# Patient Record
Sex: Male | Born: 1935 | Race: White | Hispanic: No | Marital: Married | Smoking: Former smoker
Health system: Southern US, Community
[De-identification: ages and names within clinical notes are randomized; demographics above are authoritative.]

## PROBLEM LIST (undated history)

## (undated) DIAGNOSIS — E785 Hyperlipidemia, unspecified: Secondary | ICD-10-CM

## (undated) DIAGNOSIS — I350 Nonrheumatic aortic (valve) stenosis: Secondary | ICD-10-CM

## (undated) DIAGNOSIS — Z952 Presence of prosthetic heart valve: Secondary | ICD-10-CM

## (undated) DIAGNOSIS — K429 Umbilical hernia without obstruction or gangrene: Secondary | ICD-10-CM

## (undated) DIAGNOSIS — Z951 Presence of aortocoronary bypass graft: Secondary | ICD-10-CM

## (undated) DIAGNOSIS — I2510 Atherosclerotic heart disease of native coronary artery without angina pectoris: Secondary | ICD-10-CM

## (undated) DIAGNOSIS — E119 Type 2 diabetes mellitus without complications: Secondary | ICD-10-CM

## (undated) DIAGNOSIS — I10 Essential (primary) hypertension: Secondary | ICD-10-CM

## (undated) DIAGNOSIS — I519 Heart disease, unspecified: Secondary | ICD-10-CM

## (undated) DIAGNOSIS — I719 Aortic aneurysm of unspecified site, without rupture: Secondary | ICD-10-CM

## (undated) DIAGNOSIS — Z8619 Personal history of other infectious and parasitic diseases: Secondary | ICD-10-CM

## (undated) HISTORY — DX: Umbilical hernia without obstruction or gangrene: K42.9

## (undated) HISTORY — DX: Personal history of other infectious and parasitic diseases: Z86.19

## (undated) HISTORY — DX: Hyperlipidemia, unspecified: E78.5

## (undated) HISTORY — DX: Aortic aneurysm of unspecified site, without rupture: I71.9

## (undated) HISTORY — PX: TONSILLECTOMY: SUR1361

## (undated) HISTORY — DX: Nonrheumatic aortic (valve) stenosis: I35.0

## (undated) HISTORY — PX: ABLATION: SHX5711

## (undated) HISTORY — DX: Heart disease, unspecified: I51.9

## (undated) HISTORY — DX: Atherosclerotic heart disease of native coronary artery without angina pectoris: I25.10

## (undated) HISTORY — DX: Essential (primary) hypertension: I10

## (undated) HISTORY — DX: Presence of aortocoronary bypass graft: Z95.1

## (undated) HISTORY — DX: Type 2 diabetes mellitus without complications: E11.9

---

## 1999-11-20 HISTORY — PX: CORONARY ARTERY BYPASS GRAFT: SHX141

## 2000-04-19 HISTORY — PX: EXPLORATION POST OPERATIVE OPEN HEART: SHX5061

## 2011-12-19 DIAGNOSIS — I10 Essential (primary) hypertension: Secondary | ICD-10-CM | POA: Diagnosis not present

## 2011-12-19 DIAGNOSIS — E785 Hyperlipidemia, unspecified: Secondary | ICD-10-CM | POA: Diagnosis not present

## 2011-12-19 DIAGNOSIS — I259 Chronic ischemic heart disease, unspecified: Secondary | ICD-10-CM | POA: Diagnosis not present

## 2011-12-19 DIAGNOSIS — Z0000 Encounter for general adult medical examination without abnormal findings: Secondary | ICD-10-CM | POA: Diagnosis not present

## 2011-12-19 DIAGNOSIS — E786 Lipoprotein deficiency: Secondary | ICD-10-CM | POA: Diagnosis not present

## 2011-12-24 DIAGNOSIS — I714 Abdominal aortic aneurysm, without rupture, unspecified: Secondary | ICD-10-CM | POA: Diagnosis not present

## 2012-01-03 DIAGNOSIS — I359 Nonrheumatic aortic valve disorder, unspecified: Secondary | ICD-10-CM | POA: Diagnosis not present

## 2012-01-03 DIAGNOSIS — Z951 Presence of aortocoronary bypass graft: Secondary | ICD-10-CM | POA: Diagnosis not present

## 2012-01-14 DIAGNOSIS — Z85828 Personal history of other malignant neoplasm of skin: Secondary | ICD-10-CM | POA: Diagnosis not present

## 2012-01-14 DIAGNOSIS — L821 Other seborrheic keratosis: Secondary | ICD-10-CM | POA: Diagnosis not present

## 2012-01-14 DIAGNOSIS — L578 Other skin changes due to chronic exposure to nonionizing radiation: Secondary | ICD-10-CM | POA: Diagnosis not present

## 2012-01-14 DIAGNOSIS — L570 Actinic keratosis: Secondary | ICD-10-CM | POA: Diagnosis not present

## 2012-04-08 DIAGNOSIS — Z0389 Encounter for observation for other suspected diseases and conditions ruled out: Secondary | ICD-10-CM | POA: Diagnosis not present

## 2012-04-08 DIAGNOSIS — E119 Type 2 diabetes mellitus without complications: Secondary | ICD-10-CM | POA: Diagnosis not present

## 2012-04-08 DIAGNOSIS — H47239 Glaucomatous optic atrophy, unspecified eye: Secondary | ICD-10-CM | POA: Diagnosis not present

## 2012-04-08 DIAGNOSIS — H2510 Age-related nuclear cataract, unspecified eye: Secondary | ICD-10-CM | POA: Diagnosis not present

## 2012-05-21 DIAGNOSIS — I10 Essential (primary) hypertension: Secondary | ICD-10-CM | POA: Diagnosis not present

## 2012-05-21 DIAGNOSIS — E785 Hyperlipidemia, unspecified: Secondary | ICD-10-CM | POA: Diagnosis not present

## 2012-05-21 DIAGNOSIS — I714 Abdominal aortic aneurysm, without rupture, unspecified: Secondary | ICD-10-CM | POA: Diagnosis not present

## 2012-05-21 DIAGNOSIS — I259 Chronic ischemic heart disease, unspecified: Secondary | ICD-10-CM | POA: Diagnosis not present

## 2012-06-17 DIAGNOSIS — Z951 Presence of aortocoronary bypass graft: Secondary | ICD-10-CM | POA: Diagnosis not present

## 2012-06-17 DIAGNOSIS — I359 Nonrheumatic aortic valve disorder, unspecified: Secondary | ICD-10-CM | POA: Diagnosis not present

## 2012-07-09 DIAGNOSIS — E782 Mixed hyperlipidemia: Secondary | ICD-10-CM | POA: Diagnosis not present

## 2012-07-09 DIAGNOSIS — Z951 Presence of aortocoronary bypass graft: Secondary | ICD-10-CM | POA: Diagnosis not present

## 2012-07-09 DIAGNOSIS — I10 Essential (primary) hypertension: Secondary | ICD-10-CM | POA: Diagnosis not present

## 2012-07-09 DIAGNOSIS — I359 Nonrheumatic aortic valve disorder, unspecified: Secondary | ICD-10-CM | POA: Diagnosis not present

## 2012-07-14 DIAGNOSIS — Z85828 Personal history of other malignant neoplasm of skin: Secondary | ICD-10-CM | POA: Diagnosis not present

## 2012-07-14 DIAGNOSIS — L821 Other seborrheic keratosis: Secondary | ICD-10-CM | POA: Diagnosis not present

## 2012-07-14 DIAGNOSIS — L570 Actinic keratosis: Secondary | ICD-10-CM | POA: Diagnosis not present

## 2012-07-14 DIAGNOSIS — L578 Other skin changes due to chronic exposure to nonionizing radiation: Secondary | ICD-10-CM | POA: Diagnosis not present

## 2012-08-22 DIAGNOSIS — Z23 Encounter for immunization: Secondary | ICD-10-CM | POA: Diagnosis not present

## 2012-12-15 DIAGNOSIS — E785 Hyperlipidemia, unspecified: Secondary | ICD-10-CM | POA: Diagnosis not present

## 2012-12-15 DIAGNOSIS — N529 Male erectile dysfunction, unspecified: Secondary | ICD-10-CM | POA: Diagnosis not present

## 2012-12-15 DIAGNOSIS — K4090 Unilateral inguinal hernia, without obstruction or gangrene, not specified as recurrent: Secondary | ICD-10-CM | POA: Diagnosis not present

## 2012-12-15 DIAGNOSIS — E786 Lipoprotein deficiency: Secondary | ICD-10-CM | POA: Diagnosis not present

## 2012-12-30 DIAGNOSIS — Z951 Presence of aortocoronary bypass graft: Secondary | ICD-10-CM | POA: Diagnosis not present

## 2012-12-30 DIAGNOSIS — E782 Mixed hyperlipidemia: Secondary | ICD-10-CM | POA: Diagnosis not present

## 2012-12-30 DIAGNOSIS — I10 Essential (primary) hypertension: Secondary | ICD-10-CM | POA: Diagnosis not present

## 2012-12-30 DIAGNOSIS — I359 Nonrheumatic aortic valve disorder, unspecified: Secondary | ICD-10-CM | POA: Diagnosis not present

## 2013-01-08 DIAGNOSIS — I471 Supraventricular tachycardia: Secondary | ICD-10-CM | POA: Diagnosis not present

## 2013-01-08 DIAGNOSIS — I061 Rheumatic aortic insufficiency: Secondary | ICD-10-CM | POA: Diagnosis not present

## 2013-01-08 DIAGNOSIS — Z951 Presence of aortocoronary bypass graft: Secondary | ICD-10-CM | POA: Diagnosis not present

## 2013-01-08 DIAGNOSIS — I060 Rheumatic aortic stenosis: Secondary | ICD-10-CM | POA: Diagnosis not present

## 2013-01-08 DIAGNOSIS — Z23 Encounter for immunization: Secondary | ICD-10-CM | POA: Diagnosis not present

## 2013-04-10 DIAGNOSIS — I10 Essential (primary) hypertension: Secondary | ICD-10-CM | POA: Diagnosis not present

## 2013-04-10 DIAGNOSIS — H47239 Glaucomatous optic atrophy, unspecified eye: Secondary | ICD-10-CM | POA: Diagnosis not present

## 2013-04-10 DIAGNOSIS — I519 Heart disease, unspecified: Secondary | ICD-10-CM | POA: Diagnosis not present

## 2013-04-10 DIAGNOSIS — E119 Type 2 diabetes mellitus without complications: Secondary | ICD-10-CM | POA: Diagnosis not present

## 2013-04-20 DIAGNOSIS — Z85828 Personal history of other malignant neoplasm of skin: Secondary | ICD-10-CM | POA: Diagnosis not present

## 2013-04-20 DIAGNOSIS — L821 Other seborrheic keratosis: Secondary | ICD-10-CM | POA: Diagnosis not present

## 2013-04-20 DIAGNOSIS — L570 Actinic keratosis: Secondary | ICD-10-CM | POA: Diagnosis not present

## 2013-04-20 DIAGNOSIS — L578 Other skin changes due to chronic exposure to nonionizing radiation: Secondary | ICD-10-CM | POA: Diagnosis not present

## 2013-05-14 DIAGNOSIS — E786 Lipoprotein deficiency: Secondary | ICD-10-CM | POA: Diagnosis not present

## 2013-05-14 DIAGNOSIS — E785 Hyperlipidemia, unspecified: Secondary | ICD-10-CM | POA: Diagnosis not present

## 2013-05-14 DIAGNOSIS — I2510 Atherosclerotic heart disease of native coronary artery without angina pectoris: Secondary | ICD-10-CM | POA: Diagnosis not present

## 2013-05-14 DIAGNOSIS — Z0000 Encounter for general adult medical examination without abnormal findings: Secondary | ICD-10-CM | POA: Diagnosis not present

## 2013-05-14 DIAGNOSIS — I10 Essential (primary) hypertension: Secondary | ICD-10-CM | POA: Diagnosis not present

## 2013-06-23 DIAGNOSIS — Z951 Presence of aortocoronary bypass graft: Secondary | ICD-10-CM | POA: Diagnosis not present

## 2013-06-23 DIAGNOSIS — I060 Rheumatic aortic stenosis: Secondary | ICD-10-CM | POA: Diagnosis not present

## 2013-06-23 DIAGNOSIS — I061 Rheumatic aortic insufficiency: Secondary | ICD-10-CM | POA: Diagnosis not present

## 2013-06-23 DIAGNOSIS — I471 Supraventricular tachycardia: Secondary | ICD-10-CM | POA: Diagnosis not present

## 2013-07-08 DIAGNOSIS — Z951 Presence of aortocoronary bypass graft: Secondary | ICD-10-CM | POA: Diagnosis not present

## 2013-07-08 DIAGNOSIS — Z23 Encounter for immunization: Secondary | ICD-10-CM | POA: Diagnosis not present

## 2013-07-08 DIAGNOSIS — I061 Rheumatic aortic insufficiency: Secondary | ICD-10-CM | POA: Diagnosis not present

## 2013-07-08 DIAGNOSIS — I060 Rheumatic aortic stenosis: Secondary | ICD-10-CM | POA: Diagnosis not present

## 2013-07-08 DIAGNOSIS — E119 Type 2 diabetes mellitus without complications: Secondary | ICD-10-CM | POA: Diagnosis not present

## 2013-07-08 DIAGNOSIS — I10 Essential (primary) hypertension: Secondary | ICD-10-CM | POA: Diagnosis not present

## 2013-07-08 DIAGNOSIS — I471 Supraventricular tachycardia, unspecified: Secondary | ICD-10-CM | POA: Diagnosis not present

## 2013-07-29 DIAGNOSIS — Z23 Encounter for immunization: Secondary | ICD-10-CM | POA: Diagnosis not present

## 2013-11-16 DIAGNOSIS — E785 Hyperlipidemia, unspecified: Secondary | ICD-10-CM | POA: Diagnosis not present

## 2013-11-16 DIAGNOSIS — I10 Essential (primary) hypertension: Secondary | ICD-10-CM | POA: Diagnosis not present

## 2013-11-16 DIAGNOSIS — I2510 Atherosclerotic heart disease of native coronary artery without angina pectoris: Secondary | ICD-10-CM | POA: Diagnosis not present

## 2013-11-16 DIAGNOSIS — E119 Type 2 diabetes mellitus without complications: Secondary | ICD-10-CM | POA: Diagnosis not present

## 2013-11-19 HISTORY — PX: UMBILICAL HERNIA REPAIR: SHX196

## 2013-12-28 DIAGNOSIS — L719 Rosacea, unspecified: Secondary | ICD-10-CM | POA: Diagnosis not present

## 2013-12-28 DIAGNOSIS — Z85828 Personal history of other malignant neoplasm of skin: Secondary | ICD-10-CM | POA: Diagnosis not present

## 2013-12-28 DIAGNOSIS — L578 Other skin changes due to chronic exposure to nonionizing radiation: Secondary | ICD-10-CM | POA: Diagnosis not present

## 2013-12-28 DIAGNOSIS — L570 Actinic keratosis: Secondary | ICD-10-CM | POA: Diagnosis not present

## 2014-01-11 DIAGNOSIS — K4090 Unilateral inguinal hernia, without obstruction or gangrene, not specified as recurrent: Secondary | ICD-10-CM | POA: Diagnosis not present

## 2014-01-13 DIAGNOSIS — Z79899 Other long term (current) drug therapy: Secondary | ICD-10-CM | POA: Diagnosis not present

## 2014-01-13 DIAGNOSIS — K4090 Unilateral inguinal hernia, without obstruction or gangrene, not specified as recurrent: Secondary | ICD-10-CM | POA: Diagnosis not present

## 2014-01-13 DIAGNOSIS — Z01818 Encounter for other preprocedural examination: Secondary | ICD-10-CM | POA: Diagnosis not present

## 2014-01-13 DIAGNOSIS — Z87891 Personal history of nicotine dependence: Secondary | ICD-10-CM | POA: Diagnosis not present

## 2014-01-13 DIAGNOSIS — I10 Essential (primary) hypertension: Secondary | ICD-10-CM | POA: Diagnosis not present

## 2014-01-13 DIAGNOSIS — Z7982 Long term (current) use of aspirin: Secondary | ICD-10-CM | POA: Diagnosis not present

## 2014-01-13 DIAGNOSIS — I2510 Atherosclerotic heart disease of native coronary artery without angina pectoris: Secondary | ICD-10-CM | POA: Diagnosis not present

## 2014-01-13 DIAGNOSIS — I498 Other specified cardiac arrhythmias: Secondary | ICD-10-CM | POA: Diagnosis not present

## 2014-01-13 DIAGNOSIS — E119 Type 2 diabetes mellitus without complications: Secondary | ICD-10-CM | POA: Diagnosis not present

## 2014-01-19 DIAGNOSIS — K4090 Unilateral inguinal hernia, without obstruction or gangrene, not specified as recurrent: Secondary | ICD-10-CM | POA: Diagnosis not present

## 2014-01-19 DIAGNOSIS — Z79899 Other long term (current) drug therapy: Secondary | ICD-10-CM | POA: Diagnosis not present

## 2014-01-19 DIAGNOSIS — Z87891 Personal history of nicotine dependence: Secondary | ICD-10-CM | POA: Diagnosis not present

## 2014-01-19 DIAGNOSIS — E119 Type 2 diabetes mellitus without complications: Secondary | ICD-10-CM | POA: Diagnosis not present

## 2014-01-19 DIAGNOSIS — Z7982 Long term (current) use of aspirin: Secondary | ICD-10-CM | POA: Diagnosis not present

## 2014-01-19 DIAGNOSIS — I2510 Atherosclerotic heart disease of native coronary artery without angina pectoris: Secondary | ICD-10-CM | POA: Diagnosis not present

## 2014-01-19 DIAGNOSIS — I10 Essential (primary) hypertension: Secondary | ICD-10-CM | POA: Diagnosis not present

## 2014-01-26 DIAGNOSIS — I060 Rheumatic aortic stenosis: Secondary | ICD-10-CM | POA: Diagnosis not present

## 2014-01-26 DIAGNOSIS — I10 Essential (primary) hypertension: Secondary | ICD-10-CM | POA: Diagnosis not present

## 2014-01-26 DIAGNOSIS — Z951 Presence of aortocoronary bypass graft: Secondary | ICD-10-CM | POA: Diagnosis not present

## 2014-04-20 DIAGNOSIS — H2589 Other age-related cataract: Secondary | ICD-10-CM | POA: Diagnosis not present

## 2014-04-20 DIAGNOSIS — E119 Type 2 diabetes mellitus without complications: Secondary | ICD-10-CM | POA: Diagnosis not present

## 2014-07-08 ENCOUNTER — Ambulatory Visit (INDEPENDENT_AMBULATORY_CARE_PROVIDER_SITE_OTHER): Payer: Medicare Other | Admitting: Physician Assistant

## 2014-07-08 ENCOUNTER — Encounter: Payer: Self-pay | Admitting: Physician Assistant

## 2014-07-08 ENCOUNTER — Telehealth: Payer: Self-pay | Admitting: Physician Assistant

## 2014-07-08 VITALS — BP 130/76 | HR 55 | Temp 97.8°F | Resp 16 | Ht 68.0 in | Wt 174.2 lb

## 2014-07-08 DIAGNOSIS — I10 Essential (primary) hypertension: Secondary | ICD-10-CM

## 2014-07-08 DIAGNOSIS — Z1211 Encounter for screening for malignant neoplasm of colon: Secondary | ICD-10-CM

## 2014-07-08 DIAGNOSIS — E119 Type 2 diabetes mellitus without complications: Secondary | ICD-10-CM | POA: Diagnosis not present

## 2014-07-08 DIAGNOSIS — I2510 Atherosclerotic heart disease of native coronary artery without angina pectoris: Secondary | ICD-10-CM

## 2014-07-08 DIAGNOSIS — E785 Hyperlipidemia, unspecified: Secondary | ICD-10-CM | POA: Diagnosis not present

## 2014-07-08 DIAGNOSIS — E1169 Type 2 diabetes mellitus with other specified complication: Secondary | ICD-10-CM | POA: Insufficient documentation

## 2014-07-08 LAB — BASIC METABOLIC PANEL WITH GFR
BUN: 19 mg/dL (ref 6–23)
CHLORIDE: 100 meq/L (ref 96–112)
CO2: 27 meq/L (ref 19–32)
CREATININE: 1.02 mg/dL (ref 0.50–1.35)
Calcium: 9.5 mg/dL (ref 8.4–10.5)
GFR, Est African American: 80 mL/min
GFR, Est Non African American: 69 mL/min
Glucose, Bld: 84 mg/dL (ref 70–99)
Potassium: 4.3 mEq/L (ref 3.5–5.3)
SODIUM: 135 meq/L (ref 135–145)

## 2014-07-08 LAB — HEMOGLOBIN A1C
HEMOGLOBIN A1C: 7.7 % — AB (ref <5.7)
MEAN PLASMA GLUCOSE: 174 mg/dL — AB (ref <117)

## 2014-07-08 LAB — LIPID PANEL
Cholesterol: 121 mg/dL (ref 0–200)
HDL: 37 mg/dL — AB (ref >39)
LDL CALC: 69 mg/dL (ref 0–99)
Total CHOL/HDL Ratio: 3.3 Ratio
Triglycerides: 76 mg/dL (ref <150)
VLDL: 15 mg/dL (ref 0–40)

## 2014-07-08 NOTE — Assessment & Plan Note (Signed)
BP normotensive in clinic today.  Will check BMP.  Continue current regimen.

## 2014-07-08 NOTE — Progress Notes (Signed)
Pre visit review using our clinic review tool, if applicable. No additional management support is needed unless otherwise documented below in the visit note/SLS  

## 2014-07-08 NOTE — Assessment & Plan Note (Addendum)
Will recheck BMP and A1C.  Patient is on ARB therapy.  Endorses fasting glucose levels are at goal.  Discussed nutrition and foot care at today's visit.  Referral placed to Ophthalmology for diabetic eye examination.

## 2014-07-08 NOTE — Patient Instructions (Signed)
Please continue medications as directed.  You will be contacted by Dr. Rachael Fee office for an appointment and from a GI Doctor's office for a colonoscopy. Please stop by the lab for blood work before leaving today. I will cal you with your results.  Return in November for your Annual Physical.

## 2014-07-08 NOTE — Assessment & Plan Note (Signed)
Will obtain fasting lipid panel.  Will alsop check liver function. Continue Statin and ASA.

## 2014-07-08 NOTE — Progress Notes (Addendum)
Patient presents to clinic today to establish care.  Acute Concerns: No acute concerns at today's visit.  Chronic Issues: Patient with PMH significant for Hypertension, Hyperlipidemia, CAD and Type II DM.  In terms of hypertension, patient endorses is well controlled with amlodipine, spironolactone and losartan.  Denies ankle swelling.  Denies chest pain, palpitations, headache, vision changes, lightheadedness or dizziness. Takes a 325 mg aspirin daily. Is taking Metfromin 500 mg twice daily and Glyburide 2.5 mg twice daily for glucose management.  Is checking fasting blood glucose daily.  Ranges from 80-110.  Last A1C 4 months ago at 7.4.  Denies neuropathy, of history of retinopathy or nephropathy.   Health Maintenance: Dental -- up-to-date Vision -- Needs referral to Ophthalmology. Will refer to [facility]. Immunizations -- Unsure of dates for Immunizations. Colonoscopy -- Last in 2012; needed repeat in 2015.  Will refer to GI for colonoscopy.  Past Medical History  Diagnosis Date  . Hyperlipidemia   . Hypertension   . Diabetes mellitus type II, controlled   . Umbilical hernia   . Heart disease     Ablation  . History of chicken pox     Past Surgical History  Procedure Laterality Date  . Umbilical hernia repair  2015  . Exploration post operative open heart  2001, June  . Cardiac electrophysiology mapping and ablation  2001    No current outpatient prescriptions on file prior to visit.   No current facility-administered medications on file prior to visit.    No Known Allergies  Family History  Problem Relation Age of Onset  . Diabetes Mother 64    Deceased  . Heart disease Father 22    Deceased  . Heart disease Maternal Uncle   . Diabetes Maternal Uncle   . Heart disease Brother   . Diabetes Brother   . Diabetes Sister     #1  . Heart disease Sister     #2  . Hyperlipidemia Son     x2    History   Social History  . Marital Status: Married     Spouse Name: N/A    Number of Children: N/A  . Years of Education: N/A   Occupational History  . Not on file.   Social History Main Topics  . Smoking status: Never Smoker   . Smokeless tobacco: Never Used  . Alcohol Use: No  . Drug Use: No  . Sexual Activity: Not on file   Other Topics Concern  . Not on file   Social History Narrative  . No narrative on file   Review of Systems  Constitutional: Negative for fever and weight loss.  HENT: Negative for ear discharge, ear pain, hearing loss and tinnitus.   Eyes: Negative for blurred vision, double vision, photophobia, pain and discharge.  Respiratory: Negative for shortness of breath.   Cardiovascular: Negative for chest pain and palpitations.  Gastrointestinal: Negative for heartburn, nausea, vomiting, abdominal pain, diarrhea, constipation, blood in stool and melena.  Genitourinary: Negative for dysuria, urgency, frequency, hematuria and flank pain.  Neurological: Negative for dizziness, loss of consciousness and headaches.  Endo/Heme/Allergies: Negative for environmental allergies.  Psychiatric/Behavioral: Negative for depression. The patient is not nervous/anxious and does not have insomnia.    BP 130/76  Pulse 55  Temp(Src) 97.8 F (36.6 C) (Oral)  Resp 16  Ht 5\' 8"  (1.727 m)  Wt 174 lb 4 oz (79.039 kg)  BMI 26.50 kg/m2  SpO2 97%  Physical Exam  Vitals reviewed. Constitutional:  He is oriented to person, place, and time and well-developed, well-nourished, and in no distress.  HENT:  Head: Normocephalic and atraumatic.  Right Ear: External ear normal.  Left Ear: External ear normal.  Nose: Nose normal.  Mouth/Throat: Oropharynx is clear and moist. No oropharyngeal exudate.  TM within normal limits bilaterally.  Eyes: Conjunctivae are normal.  Neck: Neck supple. No thyromegaly present.  Cardiovascular: Normal rate, regular rhythm, normal heart sounds and intact distal pulses.   No murmur heard. Pulmonary/Chest:  Effort normal and breath sounds normal. No respiratory distress. He has no wheezes. He has no rales. He exhibits no tenderness.  Lymphadenopathy:    He has no cervical adenopathy.  Neurological: He is alert and oriented to person, place, and time. No cranial nerve deficit. Gait normal.  Skin: Skin is warm and dry. No rash noted.  Psychiatric: Affect normal.    Assessment/Plan: Essential hypertension, benign BP normotensive in clinic today.  Will check BMP.  Continue current regimen.   Coronary artery disease Continue statin, antihypertensives and ASA.   Will obtain EKG and Medicare Wellness visit.  Will obtain records from previous PCP.  Type II diabetes mellitus, well controlled Will recheck BMP and A1C.  Patient is on ARB therapy.  Endorses fasting glucose levels are at goal.  Discussed nutrition and foot care at today's visit.  Referral placed to Ophthalmology for diabetic eye examination.  Hyperlipidemia LDL goal <100 Will obtain fasting lipid panel.  Will alsop check liver function. Continue Statin and ASA.   Colon cancer screening Will refer to GI per patient request but giving good history of screenings GI may not wish to continue screening.

## 2014-07-08 NOTE — Assessment & Plan Note (Signed)
Continue statin, antihypertensives and ASA.   Will obtain EKG and Medicare Wellness visit.  Will obtain records from previous PCP.

## 2014-07-08 NOTE — Telephone Encounter (Signed)
Relevant patient education assigned to patient using Emmi. ° °

## 2014-07-08 NOTE — Assessment & Plan Note (Signed)
Will refer to GI per patient request but giving good history of screenings GI may not wish to continue screening.

## 2014-07-09 ENCOUNTER — Telehealth: Payer: Self-pay | Admitting: Physician Assistant

## 2014-07-11 MED ORDER — METFORMIN HCL 500 MG PO TABS: ORAL_TABLET | ORAL | Status: DC

## 2014-07-11 NOTE — Telephone Encounter (Signed)
A1C still above goal at 7.7.  Want to increase his Metformin to 1000 mg (2 tablets) each morning and 500 mg (1 tablet) each evening.  Continue monitoring blood glucose each morning. Limit carb intake.  Eat more frequently (3-4 hours).  Make sure to eat more before significant physical activity to prevent glucose from dropping too low.  Follow-up in 3 months for medication management and repeat A1C.

## 2014-07-12 ENCOUNTER — Telehealth: Payer: Self-pay

## 2014-07-12 NOTE — Telephone Encounter (Signed)
Pt stated that "he needed to speak with Peter Scott." He didn't state the reason. LDM

## 2014-07-12 NOTE — Telephone Encounter (Signed)
De Burrs, CMA at 07/12/2014 11:58 AM     Status: Signed        Pt stated that "he needed to speak with Ivin Booty." He didn't state the reason. LDM   Spoke with patient, he is waiting to have [place] records transferred from [place] to [facility], to be able to get set-up in the system here. He is going to check price at [facility] for Metformin Rx sent by provider and call back tomorrow and let us know if he will p/u there and/or request [facility]

## 2014-07-12 NOTE — Telephone Encounter (Signed)
NOTE ALREADY OPENED RE: RESULTS AND F/U INSTRUCTIONS/sls

## 2014-07-12 NOTE — Telephone Encounter (Signed)
LMOM with contact name and number for return call RE: results and further provider instructions/SLS  

## 2014-07-14 ENCOUNTER — Telehealth: Payer: Self-pay | Admitting: *Deleted

## 2014-07-14 MED ORDER — AMLODIPINE BESYLATE 10 MG PO TABS: 5.0000 mg | ORAL_TABLET | Freq: Every day | ORAL | Status: DC

## 2014-07-14 MED ORDER — FA-PYRIDOXINE-CYANOCOBALAMIN 2.5-25-2 MG PO TABS: 1.0000 | ORAL_TABLET | Freq: Every day | ORAL | Status: DC

## 2014-07-14 MED ORDER — SPIRONOLACTONE 25 MG PO TABS: 12.5000 mg | ORAL_TABLET | Freq: Every day | ORAL | Status: DC

## 2014-07-14 MED ORDER — METFORMIN HCL 500 MG PO TABS: ORAL_TABLET | ORAL | Status: DC

## 2014-07-14 MED ORDER — LOSARTAN POTASSIUM 100 MG PO TABS: 50.0000 mg | ORAL_TABLET | Freq: Two times a day (BID) | ORAL | Status: DC

## 2014-07-14 MED ORDER — GLUCOSE BLOOD VI STRP: ORAL_STRIP | Status: DC

## 2014-07-14 MED ORDER — GLYBURIDE 5 MG PO TABS: 2.5000 mg | ORAL_TABLET | Freq: Two times a day (BID) | ORAL | Status: DC

## 2014-07-14 MED ORDER — ATORVASTATIN CALCIUM 80 MG PO TABS: 40.0000 mg | ORAL_TABLET | Freq: Every day | ORAL | Status: DC

## 2014-07-14 NOTE — Telephone Encounter (Signed)
Patient came into office requesting to have printed Rx[s] to take to [place] [90-day]; scripts printed and signed by Debbrah Alar, FNP in PCP absence from office, given to patient/SLS

## 2014-07-14 NOTE — Telephone Encounter (Signed)
Patient came into office today and stated that he had not [facility] as of yet, but that he would need to go to the [place] soon and requested to have Scripts printed [90-day]; Rx[s] printed and signed by Debbrah Alar, FNP [in PCP abscence from office] and then given to patient/SLS

## 2014-07-23 ENCOUNTER — Encounter: Payer: Self-pay | Admitting: Physician Assistant

## 2014-07-23 DIAGNOSIS — C4491 Basal cell carcinoma of skin, unspecified: Secondary | ICD-10-CM | POA: Insufficient documentation

## 2014-07-23 DIAGNOSIS — C44621 Squamous cell carcinoma of skin of unspecified upper limb, including shoulder: Secondary | ICD-10-CM | POA: Insufficient documentation

## 2014-07-23 DIAGNOSIS — Z9884 Bariatric surgery status: Secondary | ICD-10-CM | POA: Insufficient documentation

## 2014-07-23 DIAGNOSIS — I714 Abdominal aortic aneurysm, without rupture, unspecified: Secondary | ICD-10-CM | POA: Insufficient documentation

## 2014-08-16 ENCOUNTER — Encounter: Payer: Self-pay | Admitting: Physician Assistant

## 2014-08-16 ENCOUNTER — Telehealth: Payer: Self-pay | Admitting: Physician Assistant

## 2014-08-16 DIAGNOSIS — L989 Disorder of the skin and subcutaneous tissue, unspecified: Secondary | ICD-10-CM

## 2014-08-16 DIAGNOSIS — I340 Nonrheumatic mitral (valve) insufficiency: Secondary | ICD-10-CM

## 2014-08-16 NOTE — Telephone Encounter (Signed)
Spoke with patient's wife concerning Dermatology and Cardiology referrals.  Referrals placed.  Patient with hx of valvular regurgitation, asymptomatic at present.  Well overdue for repeat echocardiogram.  Order placed.

## 2014-08-18 ENCOUNTER — Other Ambulatory Visit (HOSPITAL_COMMUNITY): Payer: Medicare Other

## 2014-08-19 ENCOUNTER — Encounter (HOSPITAL_COMMUNITY): Payer: Self-pay | Admitting: Physician Assistant

## 2014-08-25 DIAGNOSIS — L570 Actinic keratosis: Secondary | ICD-10-CM | POA: Diagnosis not present

## 2014-08-25 DIAGNOSIS — Z85828 Personal history of other malignant neoplasm of skin: Secondary | ICD-10-CM | POA: Diagnosis not present

## 2014-08-25 DIAGNOSIS — Z08 Encounter for follow-up examination after completed treatment for malignant neoplasm: Secondary | ICD-10-CM | POA: Diagnosis not present

## 2014-08-25 DIAGNOSIS — D485 Neoplasm of uncertain behavior of skin: Secondary | ICD-10-CM | POA: Diagnosis not present

## 2014-08-26 ENCOUNTER — Ambulatory Visit (HOSPITAL_COMMUNITY): Payer: Medicare Other | Attending: Cardiology | Admitting: Radiology

## 2014-08-26 DIAGNOSIS — I340 Nonrheumatic mitral (valve) insufficiency: Secondary | ICD-10-CM | POA: Diagnosis not present

## 2014-08-26 DIAGNOSIS — I350 Nonrheumatic aortic (valve) stenosis: Secondary | ICD-10-CM | POA: Diagnosis not present

## 2014-08-26 DIAGNOSIS — I2510 Atherosclerotic heart disease of native coronary artery without angina pectoris: Secondary | ICD-10-CM | POA: Insufficient documentation

## 2014-08-26 NOTE — Progress Notes (Signed)
Echocardiogram performed.  

## 2014-09-01 ENCOUNTER — Ambulatory Visit (INDEPENDENT_AMBULATORY_CARE_PROVIDER_SITE_OTHER): Payer: Medicare Other

## 2014-09-01 DIAGNOSIS — Z23 Encounter for immunization: Secondary | ICD-10-CM

## 2014-09-23 DIAGNOSIS — D0439 Carcinoma in situ of skin of other parts of face: Secondary | ICD-10-CM | POA: Diagnosis not present

## 2014-09-23 DIAGNOSIS — L570 Actinic keratosis: Secondary | ICD-10-CM | POA: Diagnosis not present

## 2014-09-29 ENCOUNTER — Encounter: Payer: Self-pay | Admitting: Cardiology

## 2014-09-29 ENCOUNTER — Ambulatory Visit (INDEPENDENT_AMBULATORY_CARE_PROVIDER_SITE_OTHER): Payer: Medicare Other | Admitting: Cardiology

## 2014-09-29 ENCOUNTER — Encounter: Payer: Self-pay | Admitting: *Deleted

## 2014-09-29 VITALS — BP 140/60 | HR 62 | Ht 69.0 in | Wt 172.4 lb

## 2014-09-29 DIAGNOSIS — I2510 Atherosclerotic heart disease of native coronary artery without angina pectoris: Secondary | ICD-10-CM

## 2014-09-29 DIAGNOSIS — R0989 Other specified symptoms and signs involving the circulatory and respiratory systems: Secondary | ICD-10-CM

## 2014-09-29 DIAGNOSIS — I714 Abdominal aortic aneurysm, without rupture, unspecified: Secondary | ICD-10-CM

## 2014-09-29 DIAGNOSIS — I350 Nonrheumatic aortic (valve) stenosis: Secondary | ICD-10-CM | POA: Insufficient documentation

## 2014-09-29 NOTE — Progress Notes (Signed)
HPI: 80 year old male for evaluation of CAD and mitral regurgitation. Patient is status post coronary artery bypass graft in [place] in 2001. He also had ablation of what sounds to be SVT at that time. Previous records have been reviewed. Nuclear study in 2006 showed an ejection fraction of 70% and normal perfusion. Abdominal ultrasound in [place] in 2013 showed abdominal aortic aneurysm of 2.5 cm. Echocardiogram in October 2015 showed normal LV function. There was moderate aortic stenosis with a mean gradient of 23 mmHg. Mild to moderate aortic insufficiency. There was moderate left atrial enlargement and mild mitral regurgitation. Mild tricuspid regurgitation with mildly elevated pulmonary pressures. Patient denies dyspnea, chest pain, palpitations, syncope or bleeding.No edema.  Current Outpatient Prescriptions  Medication Sig Dispense Refill  . amLODipine (NORVASC) 10 MG tablet Take 0.5 tablets (5 mg total) by mouth daily. 45 tablet 1  . aspirin 325 MG tablet Take 325 mg by mouth every morning.    Marland Kitchen atorvastatin (LIPITOR) 80 MG tablet Take 0.5 tablets (40 mg total) by mouth at bedtime. 45 tablet 1  . folic acid-pyridoxine-cyancobalamin (FOLTX) 2.5-25-2 MG TABS Take 1 tablet by mouth daily. 90 each 1  . glucose blood test strip Bayer Contour. Use as instructed to test blood glucose once daily Dx: 250.00 200 each 1  . glyBURIDE (DIABETA) 5 MG tablet Take 0.5 tablets (2.5 mg total) by mouth 2 (two) times daily with a meal. 90 tablet 1  . losartan (COZAAR) 100 MG tablet Take 0.5 tablets (50 mg total) by mouth 2 (two) times daily. 90 tablet 1  . metFORMIN (GLUCOPHAGE) 500 MG tablet Take 2 tablets (1000 mg) by mouth each morning.  Take 1 tablet (500mg ) by mouth each evening. 270 tablet 1  . spironolactone (ALDACTONE) 25 MG tablet Take 0.5 tablets (12.5 mg total) by mouth daily. 45 tablet 1   No current facility-administered medications for this visit.    No Known Allergies   Past Medical  History  Diagnosis Date  . Hyperlipidemia   . Hypertension   . Diabetes mellitus type II, controlled   . Umbilical hernia   . Heart disease     Ablation  . History of chicken pox   . CAD (coronary artery disease)     Past Surgical History  Procedure Laterality Date  . Umbilical hernia repair  2015  . Exploration post operative open heart  2001, June  . Coronary artery bypass graft  2001  . Ablation      Rhythm problem; rhythm unknown; [place]  . Tonsillectomy      History   Social History  . Marital Status: Married    Spouse Name: N/A    Number of Children: 2  . Years of Education: N/A   Occupational History  . Not on file.   Social History Main Topics  . Smoking status: Former Research scientist (life sciences)  . Smokeless tobacco: Never Used  . Alcohol Use: No  . Drug Use: No  . Sexual Activity: Not on file   Other Topics Concern  . Not on file   Social History Narrative    Family History  Problem Relation Age of Onset  . Diabetes Mother 83    Deceased  . Heart disease Father 69    Deceased  . Heart disease Maternal Uncle   . Diabetes Maternal Uncle   . Heart disease Brother   . Diabetes Brother   . Diabetes Sister     #1  . Heart disease Sister     #  2  . Hyperlipidemia Son     x2    ROS: no fevers or chills, productive cough, hemoptysis, dysphasia, odynophagia, melena, hematochezia, dysuria, hematuria, rash, seizure activity, orthopnea, PND, pedal edema, claudication. Remaining systems are negative.  Physical Exam:   Blood pressure 140/60, pulse 62, height 5\' 9"  (1.753 m), weight 172 lb 6.4 oz (78.2 kg).  General:  Well developed/well nourished in NAD Skin warm/dry Patient not depressed No peripheral clubbing Back-normal HEENT-normal/normal eyelids Neck supple/normal carotid upstroke bilaterally; Bilateral bruits; no JVD; no thyromegaly chest - CTA/ normal expansion CV - RRR/normal S1 and S2; no rubs or gallops;  PMI nondisplaced, 2/6 systolic murmur left  sternal border. No diastolic murmur. Abdomen -NT/ND, no HSM, no mass, + bowel sounds, no bruit 2+ femoral pulses, no bruits Ext-no edema, chords, 2+ DP Neuro-grossly nonfocal  ECG Sinus rhythm at a rate of 62. Cannot rule out prior inferior infarct. Nonspecific T-wave changes. Left ventricular hypertrophy

## 2014-09-29 NOTE — Patient Instructions (Addendum)
Your physician wants you to follow-up in: [place] will receive a reminder letter in the mail two months in advance. If you don't receive a letter, please call our office to schedule the follow-up appointment.   Your physician has requested that you have en exercise stress myoview. For further information please visit [email or web address]. Please follow instruction sheet, as given.   Your physician has requested that you have a carotid duplex. This test is an ultrasound of the carotid arteries in your neck. It looks at blood flow through these arteries that supply the brain with blood. Allow one hour for this exam. There are no restrictions or special instructions.   Your physician has requested that you have an abdominal aorta duplex. During this test, an ultrasound is used to evaluate the aorta. Allow 30 minutes for this exam. Do not eat after midnight the day before and avoid carbonated beverages   STOP FOLIC ACID

## 2014-09-29 NOTE — Assessment & Plan Note (Signed)
Continue statin. 

## 2014-09-29 NOTE — Assessment & Plan Note (Signed)
Continue aspirin and statin. Outside records have been reviewed. Last nuclear study 2006. Plan repeat study for risk stratification.

## 2014-09-29 NOTE — Assessment & Plan Note (Signed)
Blood pressure controlled. Continue present medications. 

## 2014-09-29 NOTE — Assessment & Plan Note (Signed)
Plan repeat abdominal ultrasound.

## 2014-09-29 NOTE — Assessment & Plan Note (Signed)
Patient has moderate aortic stenosis and mild to moderate aortic insufficiency on echocardiogram. I explained that he may require aortic valve replacement in the future. I also discussed symptoms that he should be concerned about if they occur including chest pain, dyspnea and syncope. Will repeat echocardiogram in August 2016.

## 2014-09-29 NOTE — Assessment & Plan Note (Signed)
Schedule carotid Dopplers. 

## 2014-10-01 ENCOUNTER — Ambulatory Visit (HOSPITAL_BASED_OUTPATIENT_CLINIC_OR_DEPARTMENT_OTHER)
Admission: RE | Admit: 2014-10-01 | Discharge: 2014-10-01 | Disposition: A | Discharge status: Home / Self Care | Payer: Medicare Other | Source: Ambulatory Visit | Attending: Cardiology | Admitting: Cardiology

## 2014-10-01 DIAGNOSIS — E119 Type 2 diabetes mellitus without complications: Secondary | ICD-10-CM | POA: Insufficient documentation

## 2014-10-01 DIAGNOSIS — I2510 Atherosclerotic heart disease of native coronary artery without angina pectoris: Secondary | ICD-10-CM | POA: Diagnosis not present

## 2014-10-01 DIAGNOSIS — I714 Abdominal aortic aneurysm, without rupture, unspecified: Secondary | ICD-10-CM

## 2014-10-01 DIAGNOSIS — Z951 Presence of aortocoronary bypass graft: Secondary | ICD-10-CM | POA: Insufficient documentation

## 2014-10-01 DIAGNOSIS — I10 Essential (primary) hypertension: Secondary | ICD-10-CM | POA: Insufficient documentation

## 2014-10-01 DIAGNOSIS — R0989 Other specified symptoms and signs involving the circulatory and respiratory systems: Secondary | ICD-10-CM

## 2014-10-01 DIAGNOSIS — I6523 Occlusion and stenosis of bilateral carotid arteries: Secondary | ICD-10-CM | POA: Diagnosis not present

## 2014-10-04 ENCOUNTER — Ambulatory Visit (INDEPENDENT_AMBULATORY_CARE_PROVIDER_SITE_OTHER): Payer: Medicare Other | Admitting: Physician Assistant

## 2014-10-04 ENCOUNTER — Encounter: Payer: Self-pay | Admitting: Physician Assistant

## 2014-10-04 VITALS — BP 132/54 | HR 51 | Temp 98.2°F | Resp 16 | Ht 68.0 in | Wt 170.0 lb

## 2014-10-04 DIAGNOSIS — Z0000 Encounter for general adult medical examination without abnormal findings: Secondary | ICD-10-CM | POA: Diagnosis not present

## 2014-10-04 DIAGNOSIS — I714 Abdominal aortic aneurysm, without rupture, unspecified: Secondary | ICD-10-CM

## 2014-10-04 DIAGNOSIS — E119 Type 2 diabetes mellitus without complications: Secondary | ICD-10-CM

## 2014-10-04 DIAGNOSIS — I10 Essential (primary) hypertension: Secondary | ICD-10-CM

## 2014-10-04 DIAGNOSIS — Z23 Encounter for immunization: Secondary | ICD-10-CM

## 2014-10-04 LAB — COMPREHENSIVE METABOLIC PANEL
ALT: 23 U/L (ref 0–53)
AST: 22 U/L (ref 0–37)
Albumin: 3.6 g/dL (ref 3.5–5.2)
Alkaline Phosphatase: 57 U/L (ref 39–117)
BUN: 21 mg/dL (ref 6–23)
CALCIUM: 9.2 mg/dL (ref 8.4–10.5)
CHLORIDE: 104 meq/L (ref 96–112)
CO2: 26 mEq/L (ref 19–32)
Creatinine, Ser: 1.1 mg/dL (ref 0.4–1.5)
GFR: 65.59 mL/min (ref >60.00)
Glucose, Bld: 141 mg/dL — ABNORMAL HIGH (ref 70–99)
Potassium: 4.6 mEq/L (ref 3.5–5.1)
Sodium: 140 mEq/L (ref 135–145)
Total Bilirubin: 0.5 mg/dL (ref 0.2–1.2)
Total Protein: 6.9 g/dL (ref 6.0–8.3)

## 2014-10-04 LAB — URINALYSIS, ROUTINE W REFLEX MICROSCOPIC
Bilirubin Urine: NEGATIVE
HGB URINE DIPSTICK: NEGATIVE
Ketones, ur: NEGATIVE
LEUKOCYTES UA: NEGATIVE
NITRITE: NEGATIVE
RBC / HPF: NONE SEEN (ref 0–?)
Specific Gravity, Urine: 1.025 (ref 1.000–1.030)
Total Protein, Urine: NEGATIVE
Urine Glucose: NEGATIVE
Urobilinogen, UA: 0.2 (ref 0.0–1.0)
WBC, UA: NONE SEEN (ref 0–?)
pH: 5.5 (ref 5.0–8.0)

## 2014-10-04 LAB — CBC
HEMATOCRIT: 44.1 % (ref 39.0–52.0)
HEMOGLOBIN: 14.6 g/dL (ref 13.0–17.0)
MCHC: 33.1 g/dL (ref 30.0–36.0)
MCV: 97.2 fl (ref 78.0–100.0)
Platelets: 174 10*3/uL (ref 150.0–400.0)
RBC: 4.53 Mil/uL (ref 4.22–5.81)
RDW: 12.8 % (ref 11.5–15.5)
WBC: 7.6 10*3/uL (ref 4.0–10.5)

## 2014-10-04 LAB — LIPID PANEL
CHOL/HDL RATIO: 4
Cholesterol: 127 mg/dL (ref 0–200)
HDL: 29.9 mg/dL — ABNORMAL LOW (ref >39.00)
LDL Cholesterol: 87 mg/dL (ref 0–99)
NONHDL: 97.1
Triglycerides: 53 mg/dL (ref 0.0–149.0)
VLDL: 10.6 mg/dL (ref 0.0–40.0)

## 2014-10-04 LAB — HEMOGLOBIN A1C: Hgb A1c MFr Bld: 7.2 % — ABNORMAL HIGH (ref 4.6–6.5)

## 2014-10-04 NOTE — Progress Notes (Signed)
SUBJECTIVE: Patient presents to clinic today for Annual Medicare Wellness examination and management of other chronic issues.  Patient Risk Factors: Male Gender, Age, Hypertension, Diabetes, History of CAD  Roster of Physicians Providing Medical Care to Patient: Brunetta Jeans, PA-C -- Primary Care Dr. [name] -- Cardiology Unknown Name -- MD with [facility] in [place], [place].  Patient with upcoming appointment. Dr. Janna Arch (sp?) -- Dermatology  Chronic Medical Problems: AAA -- [place] recently obtained by Cardiology.  AAA at 4 cm.  Repeat US recommended in 6 months.   Hypertension -- Well-controlled with current regimen.  Asymptomatic. Followed by Cardiology.  Has upcoming appointment for an EST. Last EKG on 11/11 revealed NSR.  Is also taking 325 mg daily.   Diabetes Mellitus II, well-controlled -- Doing well on current regimen.  Endorses AM fasting sugars 80-120.  Is taking Metformin 1000 mg daily.   Health Maintenance: Dental -- Up-to-date Vision -- Up-to-date Immunizations -- Prevnar to be given today. Colonoscopy -- up-to-date.  Will not require repeat.  Activities of Daily Living: In your present state of health, do you have any difficulty performing the following activities? (1) Preparing food and eating?: No  (2) Bathing yourself: No  (3) Getting dressed: No  (4) Using the toilet: No  (5) Moving around from place to place: No  (6) In the past year have you fallen or had a near fall?: No     Home Safety:  Has smoke detector and wears seat belts. No firearms. No excess sun exposure.  Diet and Exercise:  Current exercise habits: walks daily, is going to the gym. Dietary issues discussed: Patient has a well-balanced and overall healthy diet   PHQ-9 Depression Screen:  (Note: if answer to either of the following is "Yes", then a more complete depression screening is indicated)  Q1: Over the past two weeks, have you felt down, depressed or hopeless? no  Q2: Over the past  two weeks, have you felt little interest or pleasure in doing things? no   The following portions of the patient's history were reviewed and updated as appropriate: allergies, current medications, past family history, past medical history, past social history, past surgical history and problem list.  OBJECTIVE:  BP 132/54 mmHg  Pulse 51  Temp(Src) 98.2 F (36.8 C) (Oral)  Resp 16  Ht 5\' 8"  (1.727 m)  Wt 170 lb (77.111 kg)  BMI 25.85 kg/m2  SpO2 98%  General Appearance:    Alert, cooperative, no distress, appears stated age  Head:    Normocephalic, without obvious abnormality, atraumatic  Eyes:    PERRL, conjunctiva/corneas clear, EOM's intact, fundi    benign, both eyes  Ears:    Normal TM's and external ear canals, both ears  Nose:   Nares normal, septum midline, mucosa normal, no drainage    or sinus tenderness  Throat:   Lips, mucosa, and tongue normal; teeth and gums normal  Neck:   Supple, symmetrical, trachea midline, no adenopathy;    thyroid:  no enlargement/tenderness/nodules; no carotid   bruit or JVD  Back:     Symmetric, no curvature, ROM normal, no CVA tenderness  Lungs:     Clear to auscultation bilaterally, respirations unlabored  Chest Wall:    No tenderness or deformity   Heart:    Regular rate and rhythm, S1 and S2 normal, no murmur, rub   or gallop  Abdomen:     Soft, non-tender, bowel sounds active all four quadrants,    no  masses, no organomegaly  Genitalia:    Normal without lesion, discharge or tenderness  Rectal:    Normal tone, normal prostate, no masses or tenderness;   guaiac negative stool  Extremities:   Extremities normal, atraumatic, no cyanosis or edema  Pulses:   2+ and symmetric all extremities  Skin:   Skin color, texture, turgor normal, no rashes or lesions  Lymph nodes:   Cervical, supraclavicular, and axillary nodes normal  Neurologic:   CNII-XII intact, normal strength, sensation and reflexes    throughout   Vision: see nursing  report. Hearing: able to hear forced whisper at 6 feet Body mass index: Body mass index is 25.85 kg/(m^2). Cognitive Impairment Assessment: cognition, memory and judgment appear normal.   Assessment/Plan: Abdominal aortic aneurysm Repeat US 2015 -- 4 cm.  Cardiology recommends repeat US in 6 months.  Will make reminder. Continue current anti-hypertensive regimen.  Type II diabetes mellitus, well controlled Will obtain repeat labs today.  Continue current regimen.  Diabetic Foot exam performed without worrisome finding.  Exam documented in chart.  Medicare annual wellness visit, subsequent During the course of the visit the patient was educated and counseled about appropriate screening and preventive services including: Fall prevention, Bone densitometry screening, Diabetes screening, Nutrition counseling.  Patient given Prevnar at today's visit. Will obtain fasting labs at today's visit.   Patient Instructions (the written plan) was given to the patient.     A written set of instructions was given to the patient at the end of this visit.

## 2014-10-04 NOTE — Patient Instructions (Signed)
Please continue medications as directed.  Follow-up with Dr. [name] as scheduled.  Stop by the lab for blood work. I will call you with your results.  We will alter medications if indicated.  Follow-up will be based on results.  Preventive Care for Adults A healthy lifestyle and preventive care can promote health and wellness. Preventive health guidelines for men include the following key practices:  A routine yearly physical is a good way to check with your health care provider about your health and preventative screening. It is a chance to share any concerns and updates on your health and to receive a thorough exam.  Visit your dentist for a routine exam and preventative care every 6 months. Brush your teeth twice a day and floss once a day. Good oral hygiene prevents tooth decay and gum disease.  The frequency of eye exams is based on your age, health, family medical history, use of contact lenses, and other factors. Follow your health care provider's recommendations for frequency of eye exams.  Eat a healthy diet. Foods such as vegetables, fruits, whole grains, low-fat dairy products, and lean protein foods contain the nutrients you need without too many calories. Decrease your intake of foods high in solid fats, added sugars, and salt. Eat the right amount of calories for you.Get information about a proper diet from your health care provider, if necessary.  Regular physical exercise is one of the most important things you can do for your health. Most adults should get at least 150 minutes of moderate-intensity exercise (any activity that increases your heart rate and causes you to sweat) each week. In addition, most adults need muscle-strengthening exercises on 2 or more days a week.  Maintain a healthy weight. The body mass index (BMI) is a screening tool to identify possible weight problems. It provides an estimate of body fat based on height and weight. Your health care provider can find  your BMI and can help you achieve or maintain a healthy weight.For adults 20 years and older:  A BMI below 18.5 is considered underweight.  A BMI of 18.5 to 24.9 is normal.  A BMI of 25 to 29.9 is considered overweight.  A BMI of 30 and above is considered obese.  Maintain normal blood lipids and cholesterol levels by exercising and minimizing your intake of saturated fat. Eat a balanced diet with plenty of fruit and vegetables. Blood tests for lipids and cholesterol should begin at age 71 and be repeated every 5 years. If your lipid or cholesterol levels are high, you are over 50, or you are at high risk for heart disease, you may need your cholesterol levels checked more frequently.Ongoing high lipid and cholesterol levels should be treated with medicines if diet and exercise are not working.  If you smoke, find out from your health care provider how to quit. If you do not use tobacco, do not start.  Lung cancer screening is recommended for adults aged 95-80 years who are at high risk for developing lung cancer because of a history of smoking. A yearly low-dose CT scan of the lungs is recommended for people who have at least a 30-pack-year history of smoking and are a current smoker or have quit within the past 15 years. A pack year of smoking is smoking an average of 1 pack of cigarettes a day for 1 year (for example: 1 pack a day for 30 years or 2 packs a day for 15 years). Yearly screening should continue until the  smoker has stopped smoking for at least 15 years. Yearly screening should be stopped for people who develop a health problem that would prevent them from having lung cancer treatment.  If you choose to drink alcohol, do not have more than 2 drinks per day. One drink is considered to be 12 ounces (355 mL) of beer, 5 ounces (148 mL) of wine, or 1.5 ounces (44 mL) of liquor.  Avoid use of street drugs. Do not share needles with anyone. Ask for help if you need support or instructions  about stopping the use of drugs.  High blood pressure causes heart disease and increases the risk of stroke. Your blood pressure should be checked at least every 1-2 years. Ongoing high blood pressure should be treated with medicines, if weight loss and exercise are not effective.  If you are 49-74 years old, ask your health care provider if you should take aspirin to prevent heart disease.  Diabetes screening involves taking a blood sample to check your fasting blood sugar level. This should be done once every 3 years, after age 108, if you are within normal weight and without risk factors for diabetes. Testing should be considered at a younger age or be carried out more frequently if you are overweight and have at least 1 risk factor for diabetes.  Colorectal cancer can be detected and often prevented. Most routine colorectal cancer screening begins at the age of 33 and continues through age 45. However, your health care provider may recommend screening at an earlier age if you have risk factors for colon cancer. On a yearly basis, your health care provider may provide home test kits to check for hidden blood in the stool. Use of a small camera at the end of a tube to directly examine the colon (sigmoidoscopy or colonoscopy) can detect the earliest forms of colorectal cancer. Talk to your health care provider about this at age 5, when routine screening begins. Direct exam of the colon should be repeated every 5-10 years through age 61, unless early forms of precancerous polyps or small growths are found.  People who are at an increased risk for hepatitis B should be screened for this virus. You are considered at high risk for hepatitis B if:  You were born in a country where hepatitis B occurs often. Talk with your health care provider about which countries are considered high risk.  Your parents were born in a high-risk country and you have not received a shot to protect against hepatitis B  (hepatitis B vaccine).  You have HIV or AIDS.  You use needles to inject street drugs.  You live with, or have sex with, someone who has hepatitis B.  You are a man who has sex with other men (MSM).  You get hemodialysis treatment.  You take certain medicines for conditions such as cancer, organ transplantation, and autoimmune conditions.  Hepatitis C blood testing is recommended for all people born from [birth year] through 1965 and any individual with known risks for hepatitis C.  Practice safe sex. Use condoms and avoid high-risk sexual practices to reduce the spread of sexually transmitted infections (STIs). STIs include gonorrhea, chlamydia, syphilis, trichomonas, herpes, HPV, and human immunodeficiency virus (HIV). Herpes, HIV, and HPV are viral illnesses that have no cure. They can result in disability, cancer, and death.  If you are at risk of being infected with HIV, it is recommended that you take a prescription medicine daily to prevent HIV infection. This is called preexposure  prophylaxis (PrEP). You are considered at risk if:  You are a man who has sex with other men (MSM) and have other risk factors.  You are a heterosexual man, are sexually active, and are at increased risk for HIV infection.  You take drugs by injection.  You are sexually active with a partner who has HIV.  Talk with your health care provider about whether you are at high risk of being infected with HIV. If you choose to begin PrEP, you should first be tested for HIV. You should then be tested every 3 months for as long as you are taking PrEP.  A one-time screening for abdominal aortic aneurysm (AAA) and surgical repair of large AAAs by ultrasound are recommended for men ages 58 to 77 years who are current or former smokers.  Healthy men should no longer receive prostate-specific antigen (PSA) blood tests as part of routine cancer screening. Talk with your health care provider about prostate cancer  screening.  Testicular cancer screening is not recommended for adult males who have no symptoms. Screening includes self-exam, a health care provider exam, and other screening tests. Consult with your health care provider about any symptoms you have or any concerns you have about testicular cancer.  Use sunscreen. Apply sunscreen liberally and repeatedly throughout the day. You should seek shade when your shadow is shorter than you. Protect yourself by wearing long sleeves, pants, a wide-brimmed hat, and sunglasses year round, whenever you are outdoors.  Once a month, do a whole-body skin exam, using a mirror to look at the skin on your back. Tell your health care provider about new moles, moles that have irregular borders, moles that are larger than a pencil eraser, or moles that have changed in shape or color.  Stay current with required vaccines (immunizations).  Influenza vaccine. All adults should be immunized every year.  Tetanus, diphtheria, and acellular pertussis (Td, Tdap) vaccine. An adult who has not previously received Tdap or who does not know his vaccine status should receive 1 dose of Tdap. This initial dose should be followed by tetanus and diphtheria toxoids (Td) booster doses every 10 years. Adults with an unknown or incomplete history of completing a 3-dose immunization series with Td-containing vaccines should begin or complete a primary immunization series including a Tdap dose. Adults should receive a Td booster every 10 years.  Varicella vaccine. An adult without evidence of immunity to varicella should receive 2 doses or a second dose if he has previously received 1 dose.  Human papillomavirus (HPV) vaccine. Males aged 109-21 years who have not received the vaccine previously should receive the 3-dose series. Males aged 22-26 years may be immunized. Immunization is recommended through the age of 31 years for any male who has sex with males and did not get any or all doses  earlier. Immunization is recommended for any person with an immunocompromised condition through the age of 9 years if he did not get any or all doses earlier. During the 3-dose series, the second dose should be obtained 4-8 weeks after the first dose. The third dose should be obtained 24 weeks after the first dose and 16 weeks after the second dose.  Zoster vaccine. One dose is recommended for adults aged 27 years or older unless certain conditions are present.  Measles, mumps, and rubella (MMR) vaccine. Adults born before [birth year] generally are considered immune to measles and mumps. Adults born in [birth year] or later should have 1 or more doses of MMR vaccine  unless there is a contraindication to the vaccine or there is laboratory evidence of immunity to each of the three diseases. A routine second dose of MMR vaccine should be obtained at least 28 days after the first dose for students attending postsecondary schools, health care workers, or international travelers. People who received inactivated measles vaccine or an unknown type of measles vaccine during 1963-1967 should receive 2 doses of MMR vaccine. People who received inactivated mumps vaccine or an unknown type of mumps vaccine before 1979 and are at high risk for mumps infection should consider immunization with 2 doses of MMR vaccine. Unvaccinated health care workers born before [birth year] who lack laboratory evidence of measles, mumps, or rubella immunity or laboratory confirmation of disease should consider measles and mumps immunization with 2 doses of MMR vaccine or rubella immunization with 1 dose of MMR vaccine.  Pneumococcal 13-valent conjugate (PCV13) vaccine. When indicated, a person who is uncertain of his immunization history and has no record of immunization should receive the PCV13 vaccine. An adult aged 90 years or older who has certain medical conditions and has not been previously immunized should receive 1 dose of PCV13 vaccine. This PCV13  should be followed with a dose of pneumococcal polysaccharide (PPSV23) vaccine. The PPSV23 vaccine dose should be obtained at least 8 weeks after the dose of PCV13 vaccine. An adult aged 70 years or older who has certain medical conditions and previously received 1 or more doses of PPSV23 vaccine should receive 1 dose of PCV13. The PCV13 vaccine dose should be obtained 1 or more years after the last PPSV23 vaccine dose.  Pneumococcal polysaccharide (PPSV23) vaccine. When PCV13 is also indicated, PCV13 should be obtained first. All adults aged 41 years and older should be immunized. An adult younger than age 39 years who has certain medical conditions should be immunized. Any person who resides in a nursing home or long-term care facility should be immunized. An adult smoker should be immunized. People with an immunocompromised condition and certain other conditions should receive both PCV13 and PPSV23 vaccines. People with human immunodeficiency virus (HIV) infection should be immunized as soon as possible after diagnosis. Immunization during chemotherapy or radiation therapy should be avoided. Routine use of PPSV23 vaccine is not recommended for American Indians, [place] Natives, or people younger than 65 years unless there are medical conditions that require PPSV23 vaccine. When indicated, people who have unknown immunization and have no record of immunization should receive PPSV23 vaccine. One-time revaccination 5 years after the first dose of PPSV23 is recommended for people aged 19-64 years who have chronic kidney failure, nephrotic syndrome, asplenia, or immunocompromised conditions. People who received 1-2 doses of PPSV23 before age 53 years should receive another dose of PPSV23 vaccine at age 87 years or later if at least 5 years have passed since the previous dose. Doses of PPSV23 are not needed for people immunized with PPSV23 at or after age 97 years.  Meningococcal vaccine. Adults with asplenia or  persistent complement component deficiencies should receive 2 doses of quadrivalent meningococcal conjugate (MenACWY-D) vaccine. The doses should be obtained at least 2 months apart. Microbiologists working with certain meningococcal bacteria, [employer] recruits, people at risk during an outbreak, and people who travel to or live in countries with a high rate of meningitis should be immunized. A first-year college student up through age 55 years who is living in a residence hall should receive a dose if he did not receive a dose on or after his 16th birthday.  Adults who have certain high-risk conditions should receive one or more doses of vaccine.  Hepatitis A vaccine. Adults who wish to be protected from this disease, have certain high-risk conditions, work with hepatitis A-infected animals, work in hepatitis A research labs, or travel to or work in countries with a high rate of hepatitis A should be immunized. Adults who were previously unvaccinated and who anticipate close contact with an international adoptee during the first 60 days after arrival in the [place] from a country with a high rate of hepatitis A should be immunized.  Hepatitis B vaccine. Adults should be immunized if they wish to be protected from this disease, have certain high-risk conditions, may be exposed to blood or other infectious body fluids, are household contacts or sex partners of hepatitis B positive people, are clients or workers in certain care facilities, or travel to or work in countries with a high rate of hepatitis B.  Haemophilus influenzae type b (Hib) vaccine. A previously unvaccinated person with asplenia or sickle cell disease or having a scheduled splenectomy should receive 1 dose of Hib vaccine. Regardless of previous immunization, a recipient of a hematopoietic stem cell transplant should receive a 3-dose series 6-12 months after his successful transplant. Hib vaccine is not recommended for adults with HIV  infection. Preventive Service / Frequency Ages 33 to 27  Blood pressure check.** / Every 1 to 2 years.  Lipid and cholesterol check.** / Every 5 years beginning at age 16.  Hepatitis C blood test.** / For any individual with known risks for hepatitis C.  Skin self-exam. / Monthly.  Influenza vaccine. / Every year.  Tetanus, diphtheria, and acellular pertussis (Tdap, Td) vaccine.** / Consult your health care provider. 1 dose of Td every 10 years.  Varicella vaccine.** / Consult your health care provider.  HPV vaccine. / 3 doses over 6 months, if 51 or younger.  Measles, mumps, rubella (MMR) vaccine.** / You need at least 1 dose of MMR if you were born in 1957 or later. You may also need a second dose.  Pneumococcal 13-valent conjugate (PCV13) vaccine.** / Consult your health care provider.  Pneumococcal polysaccharide (PPSV23) vaccine.** / 1 to 2 doses if you smoke cigarettes or if you have certain conditions.  Meningococcal vaccine.** / 1 dose if you are age 71 to 69 years and a Market researcher living in a residence hall, or have one of several medical conditions. You may also need additional booster doses.  Hepatitis A vaccine.** / Consult your health care provider.  Hepatitis B vaccine.** / Consult your health care provider.  Haemophilus influenzae type b (Hib) vaccine.** / Consult your health care provider. Ages 15 to 13  Blood pressure check.** / Every 1 to 2 years.  Lipid and cholesterol check.** / Every 5 years beginning at age 42.  Lung cancer screening. / Every year if you are aged 70-80 years and have a 30-pack-year history of smoking and currently smoke or have quit within the past 15 years. Yearly screening is stopped once you have quit smoking for at least 15 years or develop a health problem that would prevent you from having lung cancer treatment.  Fecal occult blood test (FOBT) of stool. / Every year beginning at age 69 and continuing until age 65.  You may not have to do this test if you get a colonoscopy every 10 years.  Flexible sigmoidoscopy** or colonoscopy.** / Every 5 years for a flexible sigmoidoscopy or every 10 years for a  colonoscopy beginning at age 25 and continuing until age 3.  Hepatitis C blood test.** / For all people born from [birth year] through 1965 and any individual with known risks for hepatitis C.  Skin self-exam. / Monthly.  Influenza vaccine. / Every year.  Tetanus, diphtheria, and acellular pertussis (Tdap/Td) vaccine.** / Consult your health care provider. 1 dose of Td every 10 years.  Varicella vaccine.** / Consult your health care provider.  Zoster vaccine.** / 1 dose for adults aged 38 years or older.  Measles, mumps, rubella (MMR) vaccine.** / You need at least 1 dose of MMR if you were born in 1957 or later. You may also need a second dose.  Pneumococcal 13-valent conjugate (PCV13) vaccine.** / Consult your health care provider.  Pneumococcal polysaccharide (PPSV23) vaccine.** / 1 to 2 doses if you smoke cigarettes or if you have certain conditions.  Meningococcal vaccine.** / Consult your health care provider.  Hepatitis A vaccine.** / Consult your health care provider.  Hepatitis B vaccine.** / Consult your health care provider.  Haemophilus influenzae type b (Hib) vaccine.** / Consult your health care provider. Ages 37 and over  Blood pressure check.** / Every 1 to 2 years.  Lipid and cholesterol check.**/ Every 5 years beginning at age 74.  Lung cancer screening. / Every year if you are aged 76-80 years and have a 30-pack-year history of smoking and currently smoke or have quit within the past 15 years. Yearly screening is stopped once you have quit smoking for at least 15 years or develop a health problem that would prevent you from having lung cancer treatment.  Fecal occult blood test (FOBT) of stool. / Every year beginning at age 40 and continuing until age 69. You may not have to do this  test if you get a colonoscopy every 10 years.  Flexible sigmoidoscopy** or colonoscopy.** / Every 5 years for a flexible sigmoidoscopy or every 10 years for a colonoscopy beginning at age 10 and continuing until age 20.  Hepatitis C blood test.** / For all people born from [birth year] through 1965 and any individual with known risks for hepatitis C.  Abdominal aortic aneurysm (AAA) screening.** / A one-time screening for ages 74 to 66 years who are current or former smokers.  Skin self-exam. / Monthly.  Influenza vaccine. / Every year.  Tetanus, diphtheria, and acellular pertussis (Tdap/Td) vaccine.** / 1 dose of Td every 10 years.  Varicella vaccine.** / Consult your health care provider.  Zoster vaccine.** / 1 dose for adults aged 66 years or older.  Pneumococcal 13-valent conjugate (PCV13) vaccine.** / Consult your health care provider.  Pneumococcal polysaccharide (PPSV23) vaccine.** / 1 dose for all adults aged 35 years and older.  Meningococcal vaccine.** / Consult your health care provider.  Hepatitis A vaccine.** / Consult your health care provider.  Hepatitis B vaccine.** / Consult your health care provider.  Haemophilus influenzae type b (Hib) vaccine.** / Consult your health care provider. **Family history and personal history of risk and conditions may change your health care provider's recommendations. Document Released: 01/01/2002 Document Revised: 11/10/2013 Document Reviewed: 04/02/2011 [facility] Patient Information 2015 [place], [place]. This information is not intended to replace advice given to you by your health care provider. Make sure you discuss any questions you have with your health care provider.

## 2014-10-04 NOTE — Assessment & Plan Note (Signed)
Will obtain repeat labs today.  Continue current regimen.  Diabetic Foot exam performed without worrisome finding.  Exam documented in chart.

## 2014-10-04 NOTE — Progress Notes (Signed)
Pre visit review using our clinic review tool, if applicable. No additional management support is needed unless otherwise documented below in the visit note/SLS  

## 2014-10-04 NOTE — Addendum Note (Signed)
Addended by: Rockwell Germany on: 10/04/2014 10:18 AM   Modules accepted: Orders

## 2014-10-04 NOTE — Assessment & Plan Note (Signed)
During the course of the visit the patient was educated and counseled about appropriate screening and preventive services including: Fall prevention, Bone densitometry screening, Diabetes screening, Nutrition counseling.  Patient given Prevnar at today's visit. Will obtain fasting labs at today's visit.   Patient Instructions (the written plan) was given to the patient.

## 2014-10-04 NOTE — Assessment & Plan Note (Signed)
Repeat US 2015 -- 4 cm.  Cardiology recommends repeat US in 6 months.  Will make reminder. Continue current anti-hypertensive regimen.

## 2014-10-07 ENCOUNTER — Other Ambulatory Visit: Payer: Self-pay | Admitting: *Deleted

## 2014-10-07 DIAGNOSIS — I714 Abdominal aortic aneurysm, without rupture, unspecified: Secondary | ICD-10-CM

## 2014-10-26 ENCOUNTER — Encounter (HOSPITAL_COMMUNITY): Payer: Medicare Other

## 2014-11-17 ENCOUNTER — Telehealth (HOSPITAL_COMMUNITY): Payer: Self-pay

## 2014-11-17 NOTE — Telephone Encounter (Signed)
Encounter complete. 

## 2014-11-23 ENCOUNTER — Ambulatory Visit (HOSPITAL_COMMUNITY)
Admission: RE | Admit: 2014-11-23 | Discharge: 2014-11-23 | Disposition: A | Discharge status: Home / Self Care | Payer: Medicare Other | Source: Ambulatory Visit | Attending: Cardiology | Admitting: Cardiology

## 2014-11-23 DIAGNOSIS — E785 Hyperlipidemia, unspecified: Secondary | ICD-10-CM | POA: Diagnosis not present

## 2014-11-23 DIAGNOSIS — I2510 Atherosclerotic heart disease of native coronary artery without angina pectoris: Secondary | ICD-10-CM | POA: Diagnosis not present

## 2014-11-23 DIAGNOSIS — I10 Essential (primary) hypertension: Secondary | ICD-10-CM | POA: Insufficient documentation

## 2014-11-23 DIAGNOSIS — Z951 Presence of aortocoronary bypass graft: Secondary | ICD-10-CM | POA: Diagnosis not present

## 2014-11-23 DIAGNOSIS — Z8249 Family history of ischemic heart disease and other diseases of the circulatory system: Secondary | ICD-10-CM | POA: Diagnosis not present

## 2014-11-23 DIAGNOSIS — I739 Peripheral vascular disease, unspecified: Secondary | ICD-10-CM | POA: Insufficient documentation

## 2014-11-23 DIAGNOSIS — Z87891 Personal history of nicotine dependence: Secondary | ICD-10-CM | POA: Insufficient documentation

## 2014-11-23 DIAGNOSIS — E119 Type 2 diabetes mellitus without complications: Secondary | ICD-10-CM | POA: Diagnosis not present

## 2014-11-23 DIAGNOSIS — R9431 Abnormal electrocardiogram [ECG] [EKG]: Secondary | ICD-10-CM | POA: Diagnosis not present

## 2014-11-23 MED ORDER — TECHNETIUM TC 99M SESTAMIBI GENERIC - CARDIOLITE
30.0000 | Freq: Once | INTRAVENOUS | Status: AC | PRN
  Administered 2014-11-23: 30 via INTRAVENOUS

## 2014-11-23 MED ORDER — TECHNETIUM TC 99M SESTAMIBI GENERIC - CARDIOLITE
10.0000 | Freq: Once | INTRAVENOUS | Status: AC | PRN
  Administered 2014-11-23: 10 via INTRAVENOUS

## 2014-11-23 NOTE — Procedures (Addendum)
[place] [address] [place] [address] [phone]  Cardiology Nuclear Med Study  Peter Scott is a 81 y.o. male     MRN : [id number]     DOB: [birth year]  Procedure Date: 11/23/2014  Nuclear Med Background Indication for Stress Test:  Abnormal EKG and Follow up CAD History:  CAD;CABG X4-2001;SVT;PVD;AAA;Ablation;Last nUC MPI in 2006-normal;EF=70% Cardiac Risk Factors: Carotid Disease, Family History - CAD, History of Smoking, Hypertension, Lipids, NIDDM and PVD  Symptoms:  Pt denies all symptoms at this time.   Nuclear Pre-Procedure Caffeine/Decaff Intake:  9:00pm NPO After: 7:00am   IV Site: R Forearm  IV 0.9% NS with Angio Cath:  22g  Chest Size (in):  42" IV Started by: Rolene Course, RN  Height: 5\' 9"  (1.753 m)  Cup Size: n/a  BMI:  Body mass index is 25.39 kg/(m^2). Weight:  172 lb (78.019 kg)   Tech Comments:  n/a    Nuclear Med Study 1 or 2 day study: 1 day  Stress Test Type:  Stress  Order Authorizing Provider:  Kirk Ruths, MD   Resting Radionuclide: Technetium 23m Sestamibi  Resting Radionuclide Dose: 10.4 mCi   Stress Radionuclide:  Technetium 49m Sestamibi  Stress Radionuclide Dose: 30.9 mCi           Stress Protocol Rest HR: 61 Stress HR:  144  Rest BP:  168/76 Stress BP:  187/76  Exercise Time (min): 10 METS:11.6   Predicted Max HR: 140 bpm % Max HR: 102.86 bpm Rate Pressure Product: 26928  Dose of Adenosine (mg):  n/a Dose of Lexiscan: n/a mg  Dose of Atropine (mg): n/a Dose of Dobutamine: n/a mcg/kg/min (at max HR)  Stress Test Technologist: Leane Para, CCT Nuclear Technologist: Otho Perl, CNMT   Rest Procedure:  Myocardial perfusion imaging was performed at rest 45 minutes following the intravenous administration of Technetium 92m Sestamibi. Stress Procedure:  The patient performed treadmill exercise using a Bruce  Protocol for 10 minutes. The patient stopped due to SOB,  NSVT and denied any chest pain.  There were no significant ST-T wave changes.  Technetium 59m Sestamibi was injected IV at peak exercise and myocardial perfusion imaging was performed after a brief delay.  Transient Ischemic Dilatation (Normal <1.22):  1.00 QGS EDV:  114 ml QGS ESV:  58 ml LV Ejection Fraction: 49%       Rest ECG: NSR - Normal EKG  Stress ECG: No significant change from baseline ECG  QPS Raw Data Images:  Normal; no motion artifact; normal heart/lung ratio. Stress Images:  There is decreased uptake in the anterior wall. Rest Images:  Normal homogeneous uptake in all areas of the myocardium. Subtraction (SDS):  These findings are consistent with ischemia.  Impression Exercise Capacity:  Excellent exercise capacity. BP Response:  Normal blood pressure response. Clinical Symptoms:  No significant symptoms noted. ECG Impression:  No significant ST segment change suggestive of ischemia. Comparison with Prior Nuclear Study: No images to compare  Overall Impression:  Low risk stress nuclear study Subtle anterior ischemia, mild thinning inferolateral wall.  LV Wall Motion:  Mild global hypokinesis   Lorretta Harp, MD  11/23/2014 1:14 PM

## 2014-12-23 ENCOUNTER — Other Ambulatory Visit: Payer: Self-pay | Admitting: Physician Assistant

## 2014-12-23 NOTE — Telephone Encounter (Signed)
Last filled: 07/14/14 Amt:  270, 1 refill Last OV:  10/04/14 Need follow up appointment; was suppose to follow up within 3 mths.  Med filled x 1 month.

## 2015-01-05 ENCOUNTER — Ambulatory Visit (INDEPENDENT_AMBULATORY_CARE_PROVIDER_SITE_OTHER): Payer: Medicare Other | Admitting: Physician Assistant

## 2015-01-05 ENCOUNTER — Encounter: Payer: Self-pay | Admitting: Physician Assistant

## 2015-01-05 VITALS — BP 145/54 | HR 61 | Temp 98.0°F | Resp 16 | Ht 69.0 in | Wt 171.1 lb

## 2015-01-05 DIAGNOSIS — E119 Type 2 diabetes mellitus without complications: Secondary | ICD-10-CM | POA: Diagnosis not present

## 2015-01-05 DIAGNOSIS — I10 Essential (primary) hypertension: Secondary | ICD-10-CM

## 2015-01-05 DIAGNOSIS — I2510 Atherosclerotic heart disease of native coronary artery without angina pectoris: Secondary | ICD-10-CM | POA: Diagnosis not present

## 2015-01-05 DIAGNOSIS — Z23 Encounter for immunization: Secondary | ICD-10-CM

## 2015-01-05 LAB — HEMOGLOBIN A1C: HEMOGLOBIN A1C: 7 % — AB (ref 4.6–6.5)

## 2015-01-05 LAB — BASIC METABOLIC PANEL
BUN: 27 mg/dL — AB (ref 6–23)
CO2: 29 meq/L (ref 19–32)
Calcium: 9.9 mg/dL (ref 8.4–10.5)
Chloride: 102 mEq/L (ref 96–112)
Creatinine, Ser: 1.17 mg/dL (ref 0.40–1.50)
GFR: 63.61 mL/min (ref >60.00)
Glucose, Bld: 105 mg/dL — ABNORMAL HIGH (ref 70–99)
Potassium: 4.9 mEq/L (ref 3.5–5.1)
Sodium: 138 mEq/L (ref 135–145)

## 2015-01-05 MED ORDER — SPIRONOLACTONE 25 MG PO TABS: 12.5000 mg | ORAL_TABLET | Freq: Every day | ORAL | Status: DC

## 2015-01-05 MED ORDER — METFORMIN HCL 500 MG PO TABS: ORAL_TABLET | ORAL | Status: DC

## 2015-01-05 MED ORDER — ATORVASTATIN CALCIUM 80 MG PO TABS: 40.0000 mg | ORAL_TABLET | Freq: Every day | ORAL | Status: DC

## 2015-01-05 MED ORDER — LOSARTAN POTASSIUM 100 MG PO TABS: 50.0000 mg | ORAL_TABLET | Freq: Two times a day (BID) | ORAL | Status: DC

## 2015-01-05 MED ORDER — AMLODIPINE BESYLATE 10 MG PO TABS: 5.0000 mg | ORAL_TABLET | Freq: Every day | ORAL | Status: DC

## 2015-01-05 NOTE — Assessment & Plan Note (Signed)
Well controlled.  Continue current regimen.  Will check BMP today.

## 2015-01-05 NOTE — Addendum Note (Signed)
Addended by: Rockwell Germany on: 01/05/2015 02:36 PM   Modules accepted: Orders

## 2015-01-05 NOTE — Progress Notes (Signed)
Patient presents to clinic today for follow-up of Hypertension and Diabetes.  Patient's last A1C at 7.2.  Endorses taking medications as directed.  Endorses fasting glucose averaging 100-105.  Is low sometimes at 60 if he forgets an evening snack.  Is up-to-date on foot exam and diabetic eye exam.  Is due for Tetanus. All other immunizations up-to-date.  Will be due for Pneumovax in December as he just has Prevnar last year. Patient denies chest pain, palpitations, lightheadedness, dizziness, vision changes or frequent headaches.   Past Medical History  Diagnosis Date  . Hyperlipidemia   . Hypertension   . Diabetes mellitus type II, controlled   . Umbilical hernia   . Heart disease     Ablation  . History of chicken pox   . CAD (coronary artery disease)     Current Outpatient Prescriptions on File Prior to Visit  Medication Sig Dispense Refill  . aspirin 325 MG tablet Take 325 mg by mouth every morning.    Marland Kitchen glucose blood test strip [employer]. Use as instructed to test blood glucose once daily Dx: 250.00 200 each 1  . glyBURIDE (DIABETA) 5 MG tablet Take 0.5 tablets (2.5 mg total) by mouth 2 (two) times daily with a meal. 90 tablet 1   No current facility-administered medications on file prior to visit.    No Known Allergies  Family History  Problem Relation Age of Onset  . Diabetes Mother 67    Deceased  . Heart disease Father 74    Deceased  . Heart disease Maternal Uncle   . Diabetes Maternal Uncle   . Heart disease Brother   . Diabetes Brother   . Diabetes Sister     #1  . Heart disease Sister     #2  . Hyperlipidemia Son     x2    History   Social History  . Marital Status: Married    Spouse Name: N/A  . Number of Children: 2  . Years of Education: N/A   Social History Main Topics  . Smoking status: Former Research scientist (life sciences)  . Smokeless tobacco: Never Used  . Alcohol Use: No  . Drug Use: No  . Sexual Activity: Not on file   Other Topics Concern  . None     Social History Narrative   Review of Systems - See HPI.  All other ROS are negative.  BP 145/54 mmHg  Pulse 61  Temp(Src) 98 F (36.7 C) (Oral)  Resp 16  Ht 5\' 9"  (1.753 m)  Wt 171 lb 2 oz (77.622 kg)  BMI 25.26 kg/m2  SpO2 99%  Physical Exam  Constitutional: He is oriented to person, place, and time and well-developed, well-nourished, and in no distress.  HENT:  Head: Normocephalic and atraumatic.  Cardiovascular: Normal rate, regular rhythm, normal heart sounds and intact distal pulses.   Pulmonary/Chest: Effort normal and breath sounds normal. No respiratory distress. He has no wheezes. He has no rales. He exhibits no tenderness.  Neurological: He is alert and oriented to person, place, and time.  Skin: Skin is warm and dry. No rash noted.  Psychiatric: Affect normal.  Vitals reviewed.  Assessment/Plan: Essential hypertension, benign Well controlled.  Continue current regimen.  Will check BMP today.   Type II diabetes mellitus, well controlled Will check repeat A1C and BMP today.  TDaP  Given today by nursing. Other measures are up-to-date.  Continue current regimen.  Reinforced importance of bedtime snack to prevent any hypoglycemic episodes throughout  the night.

## 2015-01-05 NOTE — Progress Notes (Signed)
Pre visit review using our clinic review tool, if applicable. No additional management support is needed unless otherwise documented below in the visit note/SLS  

## 2015-01-05 NOTE — Assessment & Plan Note (Signed)
Will check repeat A1C and BMP today.  TDaP  Given today by nursing. Other measures are up-to-date.  Continue current regimen.  Reinforced importance of bedtime snack to prevent any hypoglycemic episodes throughout the night.

## 2015-01-05 NOTE — Patient Instructions (Addendum)
Please stop by the lab for blood work.  I will call you with your results. Please continue medications as directed.  I have sent in refills to your pharmacy, except the Glyburide which you have been getting from the [place].  Please remember to have a bedtime snack each night around 7-8 PM.

## 2015-01-27 DIAGNOSIS — Z08 Encounter for follow-up examination after completed treatment for malignant neoplasm: Secondary | ICD-10-CM | POA: Diagnosis not present

## 2015-01-27 DIAGNOSIS — Z85828 Personal history of other malignant neoplasm of skin: Secondary | ICD-10-CM | POA: Diagnosis not present

## 2015-01-27 DIAGNOSIS — L570 Actinic keratosis: Secondary | ICD-10-CM | POA: Diagnosis not present

## 2015-03-03 ENCOUNTER — Encounter: Payer: Self-pay | Admitting: Physician Assistant

## 2015-05-13 ENCOUNTER — Telehealth: Payer: Self-pay | Admitting: Physician Assistant

## 2015-05-13 DIAGNOSIS — E119 Type 2 diabetes mellitus without complications: Secondary | ICD-10-CM

## 2015-05-13 NOTE — Telephone Encounter (Signed)
Minna Merritts calling again to see if we received fax

## 2015-05-13 NOTE — Telephone Encounter (Signed)
Diabetic form completed and faxed to [facility].  Confirmation received.//AB/CMA

## 2015-05-13 NOTE — Telephone Encounter (Signed)
Minna Merritts from [facility] calling in regarding this. [phone]  [facility] and will be faxing over forms that need to be signed so patient can get test strips.

## 2015-05-20 LAB — HM DIABETES EYE EXAM

## 2015-07-06 ENCOUNTER — Ambulatory Visit (INDEPENDENT_AMBULATORY_CARE_PROVIDER_SITE_OTHER): Payer: Medicare Other | Admitting: Physician Assistant

## 2015-07-06 ENCOUNTER — Encounter: Payer: Self-pay | Admitting: Physician Assistant

## 2015-07-06 ENCOUNTER — Encounter: Payer: Self-pay | Admitting: *Deleted

## 2015-07-06 VITALS — BP 150/70 | HR 69 | Temp 97.8°F | Ht 69.0 in | Wt 173.4 lb

## 2015-07-06 DIAGNOSIS — E119 Type 2 diabetes mellitus without complications: Secondary | ICD-10-CM

## 2015-07-06 DIAGNOSIS — I10 Essential (primary) hypertension: Secondary | ICD-10-CM | POA: Diagnosis not present

## 2015-07-06 DIAGNOSIS — I2510 Atherosclerotic heart disease of native coronary artery without angina pectoris: Secondary | ICD-10-CM

## 2015-07-06 LAB — BASIC METABOLIC PANEL
BUN: 27 mg/dL — ABNORMAL HIGH (ref 6–23)
CALCIUM: 9.6 mg/dL (ref 8.4–10.5)
CO2: 29 meq/L (ref 19–32)
Chloride: 102 mEq/L (ref 96–112)
Creatinine, Ser: 1.22 mg/dL (ref 0.40–1.50)
GFR: 60.54 mL/min (ref >60.00)
GLUCOSE: 92 mg/dL (ref 70–99)
POTASSIUM: 4.3 meq/L (ref 3.5–5.1)
SODIUM: 139 meq/L (ref 135–145)

## 2015-07-06 LAB — HEMOGLOBIN A1C: Hgb A1c MFr Bld: 7.2 % — ABNORMAL HIGH (ref 4.6–6.5)

## 2015-07-06 MED ORDER — AMLODIPINE BESYLATE 10 MG PO TABS: 5.0000 mg | ORAL_TABLET | Freq: Every day | ORAL | Status: DC

## 2015-07-06 MED ORDER — LOSARTAN POTASSIUM 100 MG PO TABS: 50.0000 mg | ORAL_TABLET | Freq: Two times a day (BID) | ORAL | Status: DC

## 2015-07-06 MED ORDER — ATORVASTATIN CALCIUM 80 MG PO TABS: 40.0000 mg | ORAL_TABLET | Freq: Every day | ORAL | Status: DC

## 2015-07-06 MED ORDER — METFORMIN HCL 500 MG PO TABS: ORAL_TABLET | ORAL | Status: DC

## 2015-07-06 MED ORDER — SPIRONOLACTONE 25 MG PO TABS: 12.5000 mg | ORAL_TABLET | Freq: Every day | ORAL | Status: DC

## 2015-07-06 NOTE — Patient Instructions (Signed)
Please continue medications as directed. Stay well hydrated, especially today since you will be outside all day.  Follow-up in November for a physical.

## 2015-07-06 NOTE — Assessment & Plan Note (Signed)
BP acceptable today giving age and that patient has not yet taken his losartan. Asymptomatic. Will continue current regimen. Medications refilled.

## 2015-07-06 NOTE — Assessment & Plan Note (Signed)
Endorses fasting sugars at goal. Will repeat BMP and A1C today. Continue current regimen.

## 2015-07-06 NOTE — Progress Notes (Signed)
Patient presents to clinic today for 7-month follow-up of hypertension and diabetes mellitus II, previously controlled without complication. Endorses taking medications as directed on a usual basis but has not taking losartan this morning since he has not eaten breakfast. Patient denies chest pain, palpitations, lightheadedness, dizziness, vision changes or frequent headaches. Endorses fasting sugars ranging 85-100. Last A1C at 7.0. Is up-to-date on foot exam, diabetic eye examination. Is on ARB.  BP Readings from Last 3 Encounters:  07/06/15 150/70  01/05/15 145/54  10/04/14 132/54    Past Medical History  Diagnosis Date  . Hyperlipidemia   . Hypertension   . Diabetes mellitus type II, controlled   . Umbilical hernia   . Heart disease     Ablation  . History of chicken pox   . CAD (coronary artery disease)     Current Outpatient Prescriptions on File Prior to Visit  Medication Sig Dispense Refill  . amLODipine (NORVASC) 10 MG tablet Take 0.5 tablets (5 mg total) by mouth daily. 45 tablet 1  . aspirin 325 MG tablet Take 325 mg by mouth every morning.    Marland Kitchen atorvastatin (LIPITOR) 80 MG tablet Take 0.5 tablets (40 mg total) by mouth at bedtime. 45 tablet 1  . BAYER CONTOUR TEST test strip TEST ONCE DAILY FOR DIABETES 100 each 0  . glyBURIDE (DIABETA) 5 MG tablet Take 0.5 tablets (2.5 mg total) by mouth 2 (two) times daily with a meal. 90 tablet 1  . losartan (COZAAR) 100 MG tablet Take 0.5 tablets (50 mg total) by mouth 2 (two) times daily. 90 tablet 1  . metFORMIN (GLUCOPHAGE) 500 MG tablet Take 2 tablets (1000 mg) by mouth each morning.  Take 1 tablet (500mg ) by mouth each evening. 270 tablet 1  . spironolactone (ALDACTONE) 25 MG tablet Take 0.5 tablets (12.5 mg total) by mouth daily. 45 tablet 1   No current facility-administered medications on file prior to visit.    No Known Allergies  Family History  Problem Relation Age of Onset  . Diabetes Mother 70    Deceased  .  Heart disease Father 85    Deceased  . Heart disease Maternal Uncle   . Diabetes Maternal Uncle   . Heart disease Brother   . Diabetes Brother   . Diabetes Sister     #1  . Heart disease Sister     #2  . Hyperlipidemia Son     x2    Social History   Social History  . Marital Status: Married    Spouse Name: N/A  . Number of Children: 2  . Years of Education: N/A   Social History Main Topics  . Smoking status: Former Research scientist (life sciences)  . Smokeless tobacco: Never Used  . Alcohol Use: No  . Drug Use: No  . Sexual Activity: Not Asked   Other Topics Concern  . None   Social History Narrative   Review of Systems - See HPI.  All other ROS are negative.  BP 150/70 mmHg  Pulse 69  Temp(Src) 97.8 F (36.6 C) (Oral)  Ht 5\' 9"  (1.753 m)  Wt 173 lb 6.4 oz (78.654 kg)  BMI 25.60 kg/m2  SpO2 97%  Physical Exam  Constitutional: He is oriented to person, place, and time and well-developed, well-nourished, and in no distress.  HENT:  Head: Normocephalic and atraumatic.  Eyes: Conjunctivae are normal.  Neck: Neck supple.  Cardiovascular: Normal rate, regular rhythm, normal heart sounds and intact distal pulses.   Pulmonary/Chest:  Effort normal and breath sounds normal. No respiratory distress. He has no wheezes. He has no rales. He exhibits no tenderness.  Neurological: He is alert and oriented to person, place, and time.  Skin: Skin is warm and dry.  Psychiatric: Affect normal.  Vitals reviewed.   Recent Results (from the past 2160 hour(s))  HM DIABETES EYE EXAM     Status: None   Collection Time: 05/20/15 12:00 AM  Result Value Ref Range   HM Diabetic Eye Exam No Retinopathy No Retinopathy    Assessment/Plan: Type II diabetes mellitus, well controlled Endorses fasting sugars at goal. Will repeat BMP and A1C today. Continue current regimen.  Essential hypertension, benign BP acceptable today giving age and that patient has not yet taken his losartan. Asymptomatic. Will  continue current regimen. Medications refilled.

## 2015-07-06 NOTE — Progress Notes (Signed)
Pre visit review using our clinic review tool, if applicable. No additional management support is needed unless otherwise documented below in the visit note. 

## 2015-08-02 DIAGNOSIS — L570 Actinic keratosis: Secondary | ICD-10-CM | POA: Diagnosis not present

## 2015-08-02 DIAGNOSIS — Z08 Encounter for follow-up examination after completed treatment for malignant neoplasm: Secondary | ICD-10-CM | POA: Diagnosis not present

## 2015-08-02 DIAGNOSIS — L821 Other seborrheic keratosis: Secondary | ICD-10-CM | POA: Diagnosis not present

## 2015-08-02 DIAGNOSIS — L820 Inflamed seborrheic keratosis: Secondary | ICD-10-CM | POA: Diagnosis not present

## 2015-08-02 DIAGNOSIS — Z85828 Personal history of other malignant neoplasm of skin: Secondary | ICD-10-CM | POA: Diagnosis not present

## 2015-08-24 ENCOUNTER — Other Ambulatory Visit: Payer: Self-pay | Admitting: Physician Assistant

## 2015-08-24 MED ORDER — GLUCOSE BLOOD VI STRP: ORAL_STRIP | Status: DC

## 2015-08-25 ENCOUNTER — Ambulatory Visit (INDEPENDENT_AMBULATORY_CARE_PROVIDER_SITE_OTHER): Payer: Medicare Other

## 2015-08-25 DIAGNOSIS — Z23 Encounter for immunization: Secondary | ICD-10-CM

## 2015-10-03 ENCOUNTER — Encounter: Payer: Self-pay | Admitting: Behavioral Health

## 2015-10-03 ENCOUNTER — Telehealth: Payer: Self-pay | Admitting: Behavioral Health

## 2015-10-03 NOTE — Telephone Encounter (Signed)
Pre-Visit Call completed with patient and chart updated.   Pre-Visit Info documented in Specialty Comments under [employer].    

## 2015-10-04 ENCOUNTER — Encounter: Payer: Medicare Other | Admitting: Physician Assistant

## 2015-10-07 ENCOUNTER — Telehealth: Payer: Self-pay | Admitting: Behavioral Health

## 2015-10-07 NOTE — Telephone Encounter (Signed)
Spoke with the patient and Pre-Visit Info was updated on 10/03/15; no changes at this time. Appointment confirmed for 10/10/15 at 7:30 AM.

## 2015-10-10 ENCOUNTER — Ambulatory Visit (INDEPENDENT_AMBULATORY_CARE_PROVIDER_SITE_OTHER): Payer: Medicare Other | Admitting: Physician Assistant

## 2015-10-10 ENCOUNTER — Encounter: Payer: Self-pay | Admitting: Physician Assistant

## 2015-10-10 VITALS — BP 140/74 | HR 53 | Temp 97.8°F | Resp 16 | Ht 69.0 in | Wt 178.2 lb

## 2015-10-10 DIAGNOSIS — Z23 Encounter for immunization: Secondary | ICD-10-CM | POA: Diagnosis not present

## 2015-10-10 DIAGNOSIS — E119 Type 2 diabetes mellitus without complications: Secondary | ICD-10-CM | POA: Diagnosis not present

## 2015-10-10 DIAGNOSIS — I2510 Atherosclerotic heart disease of native coronary artery without angina pectoris: Secondary | ICD-10-CM

## 2015-10-10 DIAGNOSIS — I10 Essential (primary) hypertension: Secondary | ICD-10-CM | POA: Diagnosis not present

## 2015-10-10 DIAGNOSIS — E785 Hyperlipidemia, unspecified: Secondary | ICD-10-CM | POA: Diagnosis not present

## 2015-10-10 DIAGNOSIS — Z0000 Encounter for general adult medical examination without abnormal findings: Secondary | ICD-10-CM | POA: Diagnosis not present

## 2015-10-10 DIAGNOSIS — Z136 Encounter for screening for cardiovascular disorders: Secondary | ICD-10-CM | POA: Diagnosis not present

## 2015-10-10 DIAGNOSIS — I350 Nonrheumatic aortic (valve) stenosis: Secondary | ICD-10-CM | POA: Diagnosis not present

## 2015-10-10 DIAGNOSIS — I714 Abdominal aortic aneurysm, without rupture, unspecified: Secondary | ICD-10-CM

## 2015-10-10 LAB — COMPREHENSIVE METABOLIC PANEL
ALT: 21 U/L (ref 0–53)
AST: 19 U/L (ref 0–37)
Albumin: 3.8 g/dL (ref 3.5–5.2)
Alkaline Phosphatase: 62 U/L (ref 39–117)
BILIRUBIN TOTAL: 0.4 mg/dL (ref 0.2–1.2)
BUN: 22 mg/dL (ref 6–23)
CO2: 29 meq/L (ref 19–32)
CREATININE: 1.16 mg/dL (ref 0.40–1.50)
Calcium: 9.4 mg/dL (ref 8.4–10.5)
Chloride: 102 mEq/L (ref 96–112)
GFR: 64.12 mL/min (ref >60.00)
GLUCOSE: 128 mg/dL — AB (ref 70–99)
Potassium: 4.3 mEq/L (ref 3.5–5.1)
SODIUM: 138 meq/L (ref 135–145)
Total Protein: 6.9 g/dL (ref 6.0–8.3)

## 2015-10-10 LAB — LIPID PANEL
CHOLESTEROL: 126 mg/dL (ref 0–200)
HDL: 36.2 mg/dL — AB (ref >39.00)
LDL CALC: 78 mg/dL (ref 0–99)
NonHDL: 89.4
Total CHOL/HDL Ratio: 3
Triglycerides: 56 mg/dL (ref 0.0–149.0)
VLDL: 11.2 mg/dL (ref 0.0–40.0)

## 2015-10-10 LAB — URINALYSIS, ROUTINE W REFLEX MICROSCOPIC
Bilirubin Urine: NEGATIVE
Hgb urine dipstick: NEGATIVE
KETONES UR: NEGATIVE
Leukocytes, UA: NEGATIVE
Nitrite: NEGATIVE
PH: 5.5 (ref 5.0–8.0)
RBC / HPF: NONE SEEN (ref 0–?)
SPECIFIC GRAVITY, URINE: 1.02 (ref 1.000–1.030)
Total Protein, Urine: NEGATIVE
Urine Glucose: 100 — AB
Urobilinogen, UA: 0.2 (ref 0.0–1.0)
WBC UA: NONE SEEN (ref 0–?)

## 2015-10-10 LAB — HEMOGLOBIN A1C: Hgb A1c MFr Bld: 7.7 % — ABNORMAL HIGH (ref 4.6–6.5)

## 2015-10-10 NOTE — Progress Notes (Addendum)
Subjective:    Peter Scott is a 81 y.o. male who presents for Medicare Annual/Subsequent preventive examination.   Preventive Screening-Counseling & Management  Tobacco History  Smoking status  . Former Smoker  Smokeless tobacco  . Never Used    Problems Prior to Visit Essential Hypertension -- Patient endorses taking medications as directed. Patient denies chest pain, palpitations, lightheadedness, dizziness, vision changes or frequent headaches.  BP Readings from Last 3 Encounters:  10/10/15 140/74  07/06/15 150/70  01/05/15 145/54   Hyperlipidemia -- Is currently on Lipitor 40 mg and ASA 325 mg daily. Is fasting for labs. Due for repeat lipid panel.  Aortic Stenosis -- Followed by Cardiology. Last Echocardiogram reveals worsening aortic stenosis. Repeat echo recommended in August 2016 but has not been performed. Patient denies SOB or palpitations.  AAA -- Last [place] on 10/01/14 reveals AAA at 4 cm. Patient due for repeat US (6 months recommended). Denies abdominal pain or discomfort.   DM II, well-controlled -- Endorses taking medications as directed. Endorses fasting sugars averaging 80-120. Is up-to-date of eye exam (05/20/15 - no retinopathy). No history of chronic kidney disease or neuropathy. Is due for foot examination. Is on ARB therapy. Prevnar up-to-date. Needs Pneumovax.  Current Problems (verified) Patient Active Problem List   Diagnosis Date Noted  . Medicare annual wellness visit, subsequent 10/04/2014  . Aortic stenosis 09/29/2014  . Bruit 09/29/2014  . S/P gastric bypass 07/23/2014  . Abdominal aortic aneurysm ([place]) 07/23/2014  . BCC (basal cell carcinoma) 07/23/2014  . SCC (squamous cell carcinoma), arm 07/23/2014  . Essential hypertension, benign 07/08/2014  . Coronary artery disease 07/08/2014  . Hyperlipidemia LDL goal <100 07/08/2014  . Type II diabetes mellitus, well controlled ([place]) 07/08/2014  . Colon cancer screening 07/08/2014    Medications  Prior to Visit Current Outpatient Prescriptions on File Prior to Visit  Medication Sig Dispense Refill  . amLODipine (NORVASC) 10 MG tablet Take 0.5 tablets (5 mg total) by mouth daily. 45 tablet 1  . aspirin 325 MG tablet Take 325 mg by mouth every morning.    Marland Kitchen atorvastatin (LIPITOR) 80 MG tablet Take 0.5 tablets (40 mg total) by mouth at bedtime. 45 tablet 1  . glucose blood (BAYER CONTOUR TEST) test strip TEST ONCE DAILY FOR DIABETES 100 each 5  . glyBURIDE (DIABETA) 5 MG tablet Take 0.5 tablets (2.5 mg total) by mouth 2 (two) times daily with a meal. 90 tablet 1  . losartan (COZAAR) 100 MG tablet Take 0.5 tablets (50 mg total) by mouth 2 (two) times daily. 90 tablet 1  . metFORMIN (GLUCOPHAGE) 500 MG tablet Take 2 tablets (1000 mg) by mouth each morning.  Take 1 tablet (500mg ) by mouth each evening. 270 tablet 1  . spironolactone (ALDACTONE) 25 MG tablet Take 0.5 tablets (12.5 mg total) by mouth daily. 45 tablet 1   No current facility-administered medications on file prior to visit.    Current Medications (verified) Current Outpatient Prescriptions  Medication Sig Dispense Refill  . amLODipine (NORVASC) 10 MG tablet Take 0.5 tablets (5 mg total) by mouth daily. 45 tablet 1  . aspirin 325 MG tablet Take 325 mg by mouth every morning.    Marland Kitchen atorvastatin (LIPITOR) 80 MG tablet Take 0.5 tablets (40 mg total) by mouth at bedtime. 45 tablet 1  . glucose blood (BAYER CONTOUR TEST) test strip TEST ONCE DAILY FOR DIABETES 100 each 5  . glyBURIDE (DIABETA) 5 MG tablet Take 0.5 tablets (2.5 mg total) by mouth  2 (two) times daily with a meal. 90 tablet 1  . losartan (COZAAR) 100 MG tablet Take 0.5 tablets (50 mg total) by mouth 2 (two) times daily. 90 tablet 1  . metFORMIN (GLUCOPHAGE) 500 MG tablet Take 2 tablets (1000 mg) by mouth each morning.  Take 1 tablet (500mg ) by mouth each evening. 270 tablet 1  . spironolactone (ALDACTONE) 25 MG tablet Take 0.5 tablets (12.5 mg total) by mouth daily. 45  tablet 1   No current facility-administered medications for this visit.     Allergies (verified) Review of patient's allergies indicates no known allergies.   PAST HISTORY  Family History Family History  Problem Relation Age of Onset  . Diabetes Mother 38    Deceased  . Heart disease Father 69    Deceased  . Heart disease Maternal Uncle   . Diabetes Maternal Uncle   . Heart disease Brother   . Diabetes Brother   . Diabetes Sister     #1  . Heart disease Sister     #2  . Hyperlipidemia Son     x2    Social History Social History  Substance Use Topics  . Smoking status: Former Research scientist (life sciences)  . Smokeless tobacco: Never Used  . Alcohol Use: No    Are there smokers in your home (other than you)?  No  Risk Factors Current exercise habits: Gym/ health club routine includes jogging on track , light weights and stationary bike.  Dietary issues discussed: Well balanced diet. Body mass index is 26.31 kg/(m^2).   Cardiac risk factors: advanced age (older than 63 for men, 68 for women), diabetes mellitus, dyslipidemia, family history of premature cardiovascular disease, hypertension and male gender.  Depression Screen (Note: if answer to either of the following is "Yes", a more complete depression screening is indicated)   Q1: Over the past two weeks, have you felt down, depressed or hopeless? No  Q2: Over the past two weeks, have you felt little interest or pleasure in doing things? No  Have you lost interest or pleasure in daily life? No  Do you often feel hopeless? No  Do you cry easily over simple problems? No  Activities of Daily Living In your present state of health, do you have any difficulty performing the following activities?:  Driving? No Managing money?  No Feeding yourself? No Getting from bed to chair? No Climbing a flight of stairs? No Preparing food and eating?: No Bathing or showering? No Getting dressed: No Getting to the toilet? No Using the  toilet:No Moving around from place to place: No In the past year have you fallen or had a near fall?:No   Are you sexually active?  No  Do you have more than one partner?  N/A  Hearing Difficulties: No Do you often ask people to speak up or repeat themselves? No Do you experience ringing or noises in your ears? No Do you have difficulty understanding soft or whispered voices? No   Do you feel that you have a problem with memory? No  Do you often misplace items? No  Do you feel safe at home?  Yes  Cognitive Testing  Alert? Yes  Normal Appearance?Yes  Oriented to person? Yes  Place? Yes   Time? Yes  Recall of three objects?  Yes  Can perform simple calculations? Yes  Displays appropriate judgment?Yes  Can read the correct time from a watch face?Yes   Advanced Directives have been discussed with the patient? Yes   List  the Names of Other Physician/Practitioners you currently use: See EMR for comprehensive list. List is up-to-date.  Indicate any recent Medical Services you may have received from other than [facility] providers in the past year (date may be approximate).  Immunization History  Administered Date(s) Administered  . Influenza, High Dose Seasonal PF 08/25/2015  . Influenza,inj,Quad PF,36+ Mos 09/01/2014  . Pneumococcal Conjugate-13 10/04/2014  . Pneumococcal Polysaccharide-23 10/10/2015  . Tdap 01/05/2015    Screening Tests Health Maintenance  Topic Date Due  . HEMOGLOBIN A1C  04/08/2016  . OPHTHALMOLOGY EXAM  05/19/2016  . INFLUENZA VACCINE  06/19/2016  . FOOT EXAM  10/09/2016  . DTaP/Tdap/Td (2 - Td) 01/05/2025  . TETANUS/TDAP  01/05/2025  . ZOSTAVAX  Addressed  . PNA vac Low Risk Adult  Completed    All answers were reviewed with the patient and necessary referrals were made:  Leeanne Rio, PA-C   10/31/2015   History reviewed: allergies, current medications, past family history, past medical history, past social history, past surgical history  and problem list  Review of Systems A comprehensive review of systems was negative.    Objective:     Vision by Snellen chart: right eye:20/20, left eye:20/20 -- corrective lenses Blood pressure 140/74, pulse 53, temperature 97.8 F (36.6 C), temperature source Oral, resp. rate 16, height 5\' 9"  (1.753 m), weight 178 lb 4 oz (80.854 kg), SpO2 98 %. Body mass index is 26.31 kg/(m^2).  General appearance: alert, cooperative, appears stated age and no distress Head: Normocephalic, without obvious abnormality, atraumatic Eyes: conjunctivae/corneas clear. PERRL, EOM's intact. Fundi benign. Ears: normal TM's and external ear canals both ears Nose: Nares normal. Septum midline. Mucosa normal. No drainage or sinus tenderness. Throat: lips, mucosa, and tongue normal; teeth and gums normal Lungs: clear to auscultation bilaterally Heart: systolic murmur 2/6 Abdomen: soft, non-tender; bowel sounds normal; no masses,  no organomegaly Extremities: extremities normal, atraumatic, no cyanosis or edema Pulses: 2+ and symmetric Skin: Skin color, texture, turgor normal. No rashes or lesions Neurologic: Alert and oriented X 3, normal strength and tone. Normal symmetric reflexes. Normal coordination and gait     Assessment:     (1) Medicare Wellness, Subsequent (2) Essential Hypertension (3) Hyperlipidemia  (4) Aortic Stenosis (5) AAA (6) DM II controlled without complications (7) Needs for vaccination against strep pneumonia     Plan:     Abdominal aortic aneurysm Repeat US ordered to assess further.  Aortic stenosis Doing well overall. Overdue for repeat Echocardiogram. Study order. Follow-up scheduled with Cardiology. Continue medications as directed.  Coronary artery disease Followed by Cardiology. EKG reveals sinus bradycardia. Asymptomatic. Continue current regimen including ASA 325 mg. Will obtain repeat labs today.  Essential hypertension, benign BP stable. Asymptomatic.  Continue current regimen.  Hyperlipidemia LDL goal <100 Will assess lipid panel and LFT today  Type II diabetes mellitus, well controlled Will obtain lab workup today. HM up-to-date.   Medicare annual wellness visit, subsequent Depression screen negative. Health Maintenance reviewed. Preventive schedule discussed and handout given in AVS. Will obtain fasting labs today.      Patient Instructions (the written plan) was given to the patient.  Medicare Attestation I have personally reviewed: The patient's medical and social history Their use of alcohol, tobacco or illicit drugs Their current medications and supplements The patient's functional ability including ADLs,fall risks, home safety risks, cognitive, and hearing and visual impairment Diet and physical activities Evidence for depression or mood disorders  The patient's weight, height, BMI, and visual  acuity have been recorded in the chart.  I have made referrals, counseling, and provided education to the patient based on review of the above and I have provided the patient with a written personalized care plan for preventive services.     Raiford Noble [place], [place]   10/31/2015

## 2015-10-10 NOTE — Patient Instructions (Signed)
Please go to the lab for blood work. I will call you with your results. Continue medications as directed.  Schedule a follow-up with Dr. [name]. You will be contacted for an echocardiogram and an [place] to reassess the aortic aneurysm.  Preventive Care for Adults, Male A healthy lifestyle and preventive care can promote health and wellness. Preventive health guidelines for men include the following key practices:  A routine yearly physical is a good way to check with your health care provider about your health and preventative screening. It is a chance to share any concerns and updates on your health and to receive a thorough exam.  Visit your dentist for a routine exam and preventative care every 6 months. Brush your teeth twice a day and floss once a day. Good oral hygiene prevents tooth decay and gum disease.  The frequency of eye exams is based on your age, health, family medical history, use of contact lenses, and other factors. Follow your health care provider's recommendations for frequency of eye exams.  Eat a healthy diet. Foods such as vegetables, fruits, whole grains, low-fat dairy products, and lean protein foods contain the nutrients you need without too many calories. Decrease your intake of foods high in solid fats, added sugars, and salt. Eat the right amount of calories for you.Get information about a proper diet from your health care provider, if necessary.  Regular physical exercise is one of the most important things you can do for your health. Most adults should get at least 150 minutes of moderate-intensity exercise (any activity that increases your heart rate and causes you to sweat) each week. In addition, most adults need muscle-strengthening exercises on 2 or more days a week.  Maintain a healthy weight. The body mass index (BMI) is a screening tool to identify possible weight problems. It provides an estimate of body fat based on height and weight. Your health care  provider can find your BMI and can help you achieve or maintain a healthy weight.For adults 20 years and older:  A BMI below 18.5 is considered underweight.  A BMI of 18.5 to 24.9 is normal.  A BMI of 25 to 29.9 is considered overweight.  A BMI of 30 and above is considered obese.  Maintain normal blood lipids and cholesterol levels by exercising and minimizing your intake of saturated fat. Eat a balanced diet with plenty of fruit and vegetables. Blood tests for lipids and cholesterol should begin at age 7 and be repeated every 5 years. If your lipid or cholesterol levels are high, you are over 50, or you are at high risk for heart disease, you may need your cholesterol levels checked more frequently.Ongoing high lipid and cholesterol levels should be treated with medicines if diet and exercise are not working.  If you smoke, find out from your health care provider how to quit. If you do not use tobacco, do not start.  Lung cancer screening is recommended for adults aged 34-80 years who are at high risk for developing lung cancer because of a history of smoking. A yearly low-dose CT scan of the lungs is recommended for people who have at least a 30-pack-year history of smoking and are a current smoker or have quit within the past 15 years. A pack year of smoking is smoking an average of 1 pack of cigarettes a day for 1 year (for example: 1 pack a day for 30 years or 2 packs a day for 15 years). Yearly screening should continue until  the smoker has stopped smoking for at least 15 years. Yearly screening should be stopped for people who develop a health problem that would prevent them from having lung cancer treatment.  If you choose to drink alcohol, do not have more than 2 drinks per day. One drink is considered to be 12 ounces (355 mL) of beer, 5 ounces (148 mL) of wine, or 1.5 ounces (44 mL) of liquor.  Avoid use of street drugs. Do not share needles with anyone. Ask for help if you need  support or instructions about stopping the use of drugs.  High blood pressure causes heart disease and increases the risk of stroke. Your blood pressure should be checked at least every 1-2 years. Ongoing high blood pressure should be treated with medicines, if weight loss and exercise are not effective.  If you are 98-58 years old, ask your health care provider if you should take aspirin to prevent heart disease.  Diabetes screening is done by taking a blood sample to check your blood glucose level after you have not eaten for a certain period of time (fasting). If you are not overweight and you do not have risk factors for diabetes, you should be screened once every 3 years starting at age 6. If you are overweight or obese and you are 59-50 years of age, you should be screened for diabetes every year as part of your cardiovascular risk assessment.  Colorectal cancer can be detected and often prevented. Most routine colorectal cancer screening begins at the age of 14 and continues through age 72. However, your health care provider may recommend screening at an earlier age if you have risk factors for colon cancer. On a yearly basis, your health care provider may provide home test kits to check for hidden blood in the stool. Use of a small camera at the end of a tube to directly examine the colon (sigmoidoscopy or colonoscopy) can detect the earliest forms of colorectal cancer. Talk to your health care provider about this at age 80, when routine screening begins. Direct exam of the colon should be repeated every 5-10 years through age 67, unless early forms of precancerous polyps or small growths are found.  People who are at an increased risk for hepatitis B should be screened for this virus. You are considered at high risk for hepatitis B if:  You were born in a country where hepatitis B occurs often. Talk with your health care provider about which countries are considered high risk.  Your parents  were born in a high-risk country and you have not received a shot to protect against hepatitis B (hepatitis B vaccine).  You have HIV or AIDS.  You use needles to inject street drugs.  You live with, or have sex with, someone who has hepatitis B.  You are a man who has sex with other men (MSM).  You get hemodialysis treatment.  You take certain medicines for conditions such as cancer, organ transplantation, and autoimmune conditions.  Hepatitis C blood testing is recommended for all people born from [birth year] through 1965 and any individual with known risks for hepatitis C.  Practice safe sex. Use condoms and avoid high-risk sexual practices to reduce the spread of sexually transmitted infections (STIs). STIs include gonorrhea, chlamydia, syphilis, trichomonas, herpes, HPV, and human immunodeficiency virus (HIV). Herpes, HIV, and HPV are viral illnesses that have no cure. They can result in disability, cancer, and death.  If you are a man who has sex with other  men, you should be screened at least once per year for:  HIV.  Urethral, rectal, and pharyngeal infection of gonorrhea, chlamydia, or both.  If you are at risk of being infected with HIV, it is recommended that you take a prescription medicine daily to prevent HIV infection. This is called preexposure prophylaxis (PrEP). You are considered at risk if:  You are a man who has sex with other men (MSM) and have other risk factors.  You are a heterosexual man, are sexually active, and are at increased risk for HIV infection.  You take drugs by injection.  You are sexually active with a partner who has HIV.  Talk with your health care provider about whether you are at high risk of being infected with HIV. If you choose to begin PrEP, you should first be tested for HIV. You should then be tested every 3 months for as long as you are taking PrEP.  A one-time screening for abdominal aortic aneurysm (AAA) and surgical repair of large AAAs  by ultrasound are recommended for men ages 59 to 21 years who are current or former smokers.  Healthy men should no longer receive prostate-specific antigen (PSA) blood tests as part of routine cancer screening. Talk with your health care provider about prostate cancer screening.  Testicular cancer screening is not recommended for adult males who have no symptoms. Screening includes self-exam, a health care provider exam, and other screening tests. Consult with your health care provider about any symptoms you have or any concerns you have about testicular cancer.  Use sunscreen. Apply sunscreen liberally and repeatedly throughout the day. You should seek shade when your shadow is shorter than you. Protect yourself by wearing long sleeves, pants, a wide-brimmed hat, and sunglasses year round, whenever you are outdoors.  Once a month, do a whole-body skin exam, using a mirror to look at the skin on your back. Tell your health care provider about new moles, moles that have irregular borders, moles that are larger than a pencil eraser, or moles that have changed in shape or color.  Stay current with required vaccines (immunizations).  Influenza vaccine. All adults should be immunized every year.  Tetanus, diphtheria, and acellular pertussis (Td, Tdap) vaccine. An adult who has not previously received Tdap or who does not know his vaccine status should receive 1 dose of Tdap. This initial dose should be followed by tetanus and diphtheria toxoids (Td) booster doses every 10 years. Adults with an unknown or incomplete history of completing a 3-dose immunization series with Td-containing vaccines should begin or complete a primary immunization series including a Tdap dose. Adults should receive a Td booster every 10 years.  Varicella vaccine. An adult without evidence of immunity to varicella should receive 2 doses or a second dose if he has previously received 1 dose.  Human papillomavirus (HPV) vaccine.  Males aged 11-21 years who have not received the vaccine previously should receive the 3-dose series. Males aged 22-26 years may be immunized. Immunization is recommended through the age of 48 years for any male who has sex with males and did not get any or all doses earlier. Immunization is recommended for any person with an immunocompromised condition through the age of 46 years if he did not get any or all doses earlier. During the 3-dose series, the second dose should be obtained 4-8 weeks after the first dose. The third dose should be obtained 24 weeks after the first dose and 16 weeks after the second dose.  Zoster vaccine. One dose is recommended for adults aged 39 years or older unless certain conditions are present.  Measles, mumps, and rubella (MMR) vaccine. Adults born before [birth year] generally are considered immune to measles and mumps. Adults born in [birth year] or later should have 1 or more doses of MMR vaccine unless there is a contraindication to the vaccine or there is laboratory evidence of immunity to each of the three diseases. A routine second dose of MMR vaccine should be obtained at least 28 days after the first dose for students attending postsecondary schools, health care workers, or international travelers. People who received inactivated measles vaccine or an unknown type of measles vaccine during 1963-1967 should receive 2 doses of MMR vaccine. People who received inactivated mumps vaccine or an unknown type of mumps vaccine before 1979 and are at high risk for mumps infection should consider immunization with 2 doses of MMR vaccine. Unvaccinated health care workers born before [birth year] who lack laboratory evidence of measles, mumps, or rubella immunity or laboratory confirmation of disease should consider measles and mumps immunization with 2 doses of MMR vaccine or rubella immunization with 1 dose of MMR vaccine.  Pneumococcal 13-valent conjugate (PCV13) vaccine. When indicated, a person who is  uncertain of his immunization history and has no record of immunization should receive the PCV13 vaccine. All adults 63 years of age and older should receive this vaccine. An adult aged 66 years or older who has certain medical conditions and has not been previously immunized should receive 1 dose of PCV13 vaccine. This PCV13 should be followed with a dose of pneumococcal polysaccharide (PPSV23) vaccine. Adults who are at high risk for pneumococcal disease should obtain the PPSV23 vaccine at least 8 weeks after the dose of PCV13 vaccine. Adults older than 79 years of age who have normal immune system function should obtain the PPSV23 vaccine dose at least 1 year after the dose of PCV13 vaccine.  Pneumococcal polysaccharide (PPSV23) vaccine. When PCV13 is also indicated, PCV13 should be obtained first. All adults aged 86 years and older should be immunized. An adult younger than age 41 years who has certain medical conditions should be immunized. Any person who resides in a nursing home or long-term care facility should be immunized. An adult smoker should be immunized. People with an immunocompromised condition and certain other conditions should receive both PCV13 and PPSV23 vaccines. People with human immunodeficiency virus (HIV) infection should be immunized as soon as possible after diagnosis. Immunization during chemotherapy or radiation therapy should be avoided. Routine use of PPSV23 vaccine is not recommended for American Indians, [place] Natives, or people younger than 65 years unless there are medical conditions that require PPSV23 vaccine. When indicated, people who have unknown immunization and have no record of immunization should receive PPSV23 vaccine. One-time revaccination 5 years after the first dose of PPSV23 is recommended for people aged 19-64 years who have chronic kidney failure, nephrotic syndrome, asplenia, or immunocompromised conditions. People who received 1-2 doses of PPSV23 before age  27 years should receive another dose of PPSV23 vaccine at age 11 years or later if at least 5 years have passed since the previous dose. Doses of PPSV23 are not needed for people immunized with PPSV23 at or after age 84 years.  Meningococcal vaccine. Adults with asplenia or persistent complement component deficiencies should receive 2 doses of quadrivalent meningococcal conjugate (MenACWY-D) vaccine. The doses should be obtained at least 2 months apart. Microbiologists working with certain meningococcal bacteria, [employer] recruits, people at  risk during an outbreak, and people who travel to or live in countries with a high rate of meningitis should be immunized. A first-year college student up through age 61 years who is living in a residence hall should receive a dose if he did not receive a dose on or after his 16th birthday. Adults who have certain high-risk conditions should receive one or more doses of vaccine.  Hepatitis A vaccine. Adults who wish to be protected from this disease, have chronic liver disease, work with hepatitis A-infected animals, work in hepatitis A research labs, or travel to or work in countries with a high rate of hepatitis A should be immunized. Adults who were previously unvaccinated and who anticipate close contact with an international adoptee during the first 60 days after arrival in the [place] from a country with a high rate of hepatitis A should be immunized.  Hepatitis B vaccine. Adults should be immunized if they wish to be protected from this disease, are under age 33 years and have diabetes, have chronic liver disease, have had more than one sex partner in the past 6 months, may be exposed to blood or other infectious body fluids, are household contacts or sex partners of hepatitis B positive people, are clients or workers in certain care facilities, or travel to or work in countries with a high rate of hepatitis B.  Haemophilus influenzae type b (Hib) vaccine. A  previously unvaccinated person with asplenia or sickle cell disease or having a scheduled splenectomy should receive 1 dose of Hib vaccine. Regardless of previous immunization, a recipient of a hematopoietic stem cell transplant should receive a 3-dose series 6-12 months after his successful transplant. Hib vaccine is not recommended for adults with HIV infection. Preventive Service / Frequency Ages 23 to 71  Blood pressure check.** / Every 3-5 years.  Lipid and cholesterol check.** / Every 5 years beginning at age 56.  Hepatitis C blood test.** / For any individual with known risks for hepatitis C.  Skin self-exam. / Monthly.  Influenza vaccine. / Every year.  Tetanus, diphtheria, and acellular pertussis (Tdap, Td) vaccine.** / Consult your health care provider. 1 dose of Td every 10 years.  Varicella vaccine.** / Consult your health care provider.  HPV vaccine. / 3 doses over 6 months, if 8 or younger.  Measles, mumps, rubella (MMR) vaccine.** / You need at least 1 dose of MMR if you were born in 1957 or later. You may also need a second dose.  Pneumococcal 13-valent conjugate (PCV13) vaccine.** / Consult your health care provider.  Pneumococcal polysaccharide (PPSV23) vaccine.** / 1 to 2 doses if you smoke cigarettes or if you have certain conditions.  Meningococcal vaccine.** / 1 dose if you are age 10 to 9 years and a Market researcher living in a residence hall, or have one of several medical conditions. You may also need additional booster doses.  Hepatitis A vaccine.** / Consult your health care provider.  Hepatitis B vaccine.** / Consult your health care provider.  Haemophilus influenzae type b (Hib) vaccine.** / Consult your health care provider. Ages 25 to 71  Blood pressure check.** / Every year.  Lipid and cholesterol check.** / Every 5 years beginning at age 19.  Lung cancer screening. / Every year if you are aged 59-80 years and have a 30-pack-year  history of smoking and currently smoke or have quit within the past 15 years. Yearly screening is stopped once you have quit smoking for at least 15  years or develop a health problem that would prevent you from having lung cancer treatment.  Fecal occult blood test (FOBT) of stool. / Every year beginning at age 67 and continuing until age 58. You may not have to do this test if you get a colonoscopy every 10 years.  Flexible sigmoidoscopy** or colonoscopy.** / Every 5 years for a flexible sigmoidoscopy or every 10 years for a colonoscopy beginning at age 55 and continuing until age 69.  Hepatitis C blood test.** / For all people born from [birth year] through 1965 and any individual with known risks for hepatitis C.  Skin self-exam. / Monthly.  Influenza vaccine. / Every year.  Tetanus, diphtheria, and acellular pertussis (Tdap/Td) vaccine.** / Consult your health care provider. 1 dose of Td every 10 years.  Varicella vaccine.** / Consult your health care provider.  Zoster vaccine.** / 1 dose for adults aged 74 years or older.  Measles, mumps, rubella (MMR) vaccine.** / You need at least 1 dose of MMR if you were born in 1955 or later. You may also need a second dose.  Pneumococcal 13-valent conjugate (PCV13) vaccine.** / Consult your health care provider.  Pneumococcal polysaccharide (PPSV23) vaccine.** / 1 to 2 doses if you smoke cigarettes or if you have certain conditions.  Meningococcal vaccine.** / Consult your health care provider.  Hepatitis A vaccine.** / Consult your health care provider.  Hepatitis B vaccine.** / Consult your health care provider.  Haemophilus influenzae type b (Hib) vaccine.** / Consult your health care provider. Ages 3 and over  Blood pressure check.** / Every year.  Lipid and cholesterol check.**/ Every 5 years beginning at age 15.  Lung cancer screening. / Every year if you are aged 70-80 years and have a 30-pack-year history of smoking and currently  smoke or have quit within the past 15 years. Yearly screening is stopped once you have quit smoking for at least 15 years or develop a health problem that would prevent you from having lung cancer treatment.  Fecal occult blood test (FOBT) of stool. / Every year beginning at age 49 and continuing until age 65. You may not have to do this test if you get a colonoscopy every 10 years.  Flexible sigmoidoscopy** or colonoscopy.** / Every 5 years for a flexible sigmoidoscopy or every 10 years for a colonoscopy beginning at age 16 and continuing until age 75.  Hepatitis C blood test.** / For all people born from [birth year] through 1963 and any individual with known risks for hepatitis C.  Abdominal aortic aneurysm (AAA) screening.** / A one-time screening for ages 9 to 74 years who are current or former smokers.  Skin self-exam. / Monthly.  Influenza vaccine. / Every year.  Tetanus, diphtheria, and acellular pertussis (Tdap/Td) vaccine.** / 1 dose of Td every 10 years.  Varicella vaccine.** / Consult your health care provider.  Zoster vaccine.** / 1 dose for adults aged 9 years or older.  Pneumococcal 13-valent conjugate (PCV13) vaccine.** / 1 dose for all adults aged 96 years and older.  Pneumococcal polysaccharide (PPSV23) vaccine.** / 1 dose for all adults aged 49 years and older.  Meningococcal vaccine.** / Consult your health care provider.  Hepatitis A vaccine.** / Consult your health care provider.  Hepatitis B vaccine.** / Consult your health care provider.  Haemophilus influenzae type b (Hib) vaccine.** / Consult your health care provider. **Family history and personal history of risk and conditions may change your health care provider's recommendations.   This information is not  intended to replace advice given to you by your health care provider. Make sure you discuss any questions you have with your health care provider.   Document Released: 01/01/2002 Document Revised:  11/26/2014 Document Reviewed: 04/02/2011 Elsevier Interactive Patient Education [employer].

## 2015-10-10 NOTE — Progress Notes (Signed)
Pre visit review using our clinic review tool, if applicable. No additional management support is needed unless otherwise documented below in the visit note/SLS  

## 2015-10-11 ENCOUNTER — Encounter (INDEPENDENT_AMBULATORY_CARE_PROVIDER_SITE_OTHER): Payer: Self-pay

## 2015-10-20 ENCOUNTER — Telehealth: Payer: Self-pay | Admitting: Physician Assistant

## 2015-10-20 DIAGNOSIS — I714 Abdominal aortic aneurysm, without rupture, unspecified: Secondary | ICD-10-CM

## 2015-10-20 NOTE — Telephone Encounter (Signed)
Order has been changed. New order for US aorta placed. Can cancel old order.   Please call [place] office back to ensure they are aware.

## 2015-10-20 NOTE — Telephone Encounter (Signed)
Caller name: [place] office  Call back number: [phone]   Reason for call:  Patient order needs to be changed to U/S aorta in need of PA signature, patient appointment is 10/26/15

## 2015-10-21 NOTE — Telephone Encounter (Signed)
Patient lvm advising patient to schedule appointment

## 2015-10-21 NOTE — Telephone Encounter (Signed)
Spoke to Nitro from [facility] and she says that new orders noted.  Pt has an appt today at 2:30 pm.

## 2015-10-24 NOTE — Assessment & Plan Note (Signed)
Will assess lipid panel and LFT today

## 2015-10-24 NOTE — Assessment & Plan Note (Signed)
BP stable. Asymptomatic. Continue current regimen. 

## 2015-10-24 NOTE — Assessment & Plan Note (Addendum)
Followed by Cardiology. EKG reveals sinus bradycardia. Asymptomatic. Continue current regimen including ASA 325 mg. Will obtain repeat labs today.

## 2015-10-24 NOTE — Assessment & Plan Note (Signed)
Doing well overall. Overdue for repeat Echocardiogram. Study order. Follow-up scheduled with Cardiology. Continue medications as directed.

## 2015-10-24 NOTE — Assessment & Plan Note (Signed)
Will obtain lab workup today. HM up-to-date.

## 2015-10-24 NOTE — Assessment & Plan Note (Signed)
Repeat US ordered to assess further.

## 2015-10-24 NOTE — Assessment & Plan Note (Signed)
Depression screen negative. Health Maintenance reviewed. Preventive schedule discussed and handout given in AVS. Will obtain fasting labs today.  

## 2015-10-26 ENCOUNTER — Ambulatory Visit (HOSPITAL_BASED_OUTPATIENT_CLINIC_OR_DEPARTMENT_OTHER)
Admission: RE | Admit: 2015-10-26 | Discharge: 2015-10-26 | Disposition: A | Discharge status: Home / Self Care | Payer: Medicare Other | Source: Ambulatory Visit | Attending: Physician Assistant | Admitting: Physician Assistant

## 2015-10-26 DIAGNOSIS — I517 Cardiomegaly: Secondary | ICD-10-CM | POA: Insufficient documentation

## 2015-10-26 DIAGNOSIS — I714 Abdominal aortic aneurysm, without rupture, unspecified: Secondary | ICD-10-CM

## 2015-10-26 DIAGNOSIS — I2510 Atherosclerotic heart disease of native coronary artery without angina pectoris: Secondary | ICD-10-CM | POA: Diagnosis not present

## 2015-10-26 DIAGNOSIS — I352 Nonrheumatic aortic (valve) stenosis with insufficiency: Secondary | ICD-10-CM | POA: Diagnosis not present

## 2015-10-26 DIAGNOSIS — Z951 Presence of aortocoronary bypass graft: Secondary | ICD-10-CM | POA: Diagnosis not present

## 2015-10-26 DIAGNOSIS — E119 Type 2 diabetes mellitus without complications: Secondary | ICD-10-CM | POA: Insufficient documentation

## 2015-10-26 DIAGNOSIS — I350 Nonrheumatic aortic (valve) stenosis: Secondary | ICD-10-CM

## 2015-10-26 DIAGNOSIS — I10 Essential (primary) hypertension: Secondary | ICD-10-CM | POA: Diagnosis not present

## 2015-10-26 DIAGNOSIS — E785 Hyperlipidemia, unspecified: Secondary | ICD-10-CM | POA: Diagnosis not present

## 2015-10-26 NOTE — Progress Notes (Signed)
  Echocardiogram 2D Echocardiogram has been performed.  Peter Scott 10/26/2015, 12:29 PM

## 2015-11-09 ENCOUNTER — Encounter: Payer: Self-pay | Admitting: *Deleted

## 2015-11-22 NOTE — Progress Notes (Signed)
      HPI: FU CAD and mitral regurgitation. Patient is status post coronary artery bypass graft in [place] in 2001. He also had ablation of what sounds to be SVT at that time. Previous records have been reviewed. Carotid dopplers 11/15 showed plaque but no significant stenosis. Nuclear study 1/16 showed EF 49 and subtle anterior ischemia. Echo 12/16 showed normal LV function, grade 2 diastolic dysfunction, moderate AS (mean gradient 18 mmHg)/moderate AI, mild biatrial enlargement. Abdominal ultrasound 12/16 showed AAA 3.8 x 3.3 cm. Since last seen, Patient denies dyspnea, chest pain, palpitations or syncope. No pedal edema.  Current Outpatient Prescriptions  Medication Sig Dispense Refill  . amLODipine (NORVASC) 10 MG tablet Take 0.5 tablets (5 mg total) by mouth daily. 45 tablet 1  . aspirin 81 MG tablet Take 81 mg by mouth daily.    Marland Kitchen atorvastatin (LIPITOR) 80 MG tablet Take 0.5 tablets (40 mg total) by mouth at bedtime. 45 tablet 1  . glucose blood (BAYER CONTOUR TEST) test strip TEST ONCE DAILY FOR DIABETES 100 each 5  . glyBURIDE (DIABETA) 5 MG tablet Take 0.5 tablets (2.5 mg total) by mouth 2 (two) times daily with a meal. 90 tablet 1  . losartan (COZAAR) 100 MG tablet Take 0.5 tablets (50 mg total) by mouth 2 (two) times daily. 90 tablet 1  . metFORMIN (GLUCOPHAGE) 500 MG tablet Take 2 tablets (1000 mg) by mouth each morning.  Take 1 tablet (500mg ) by mouth each evening. 270 tablet 1  . spironolactone (ALDACTONE) 25 MG tablet Take 0.5 tablets (12.5 mg total) by mouth daily. 45 tablet 1   No current facility-administered medications for this visit.     Past Medical History  Diagnosis Date  . Hyperlipidemia   . Hypertension   . Diabetes mellitus type II, controlled ([place])   . Umbilical hernia   . Heart disease     Ablation  . History of chicken pox   . CAD (coronary artery disease)     Past Surgical History  Procedure Laterality Date  . Umbilical hernia repair  2015  .  Exploration post operative open heart  2001, June  . Coronary artery bypass graft  2001  . Ablation      Rhythm problem; rhythm unknown; [place]  . Tonsillectomy      Social History   Social History  . Marital Status: Married    Spouse Name: N/A  . Number of Children: 2  . Years of Education: N/A   Occupational History  . Not on file.   Social History Main Topics  . Smoking status: Former Research scientist (life sciences)  . Smokeless tobacco: Never Used  . Alcohol Use: No  . Drug Use: No  . Sexual Activity: Not on file   Other Topics Concern  . Not on file   Social History Narrative    ROS: Some fatigue but no fevers or chills, productive cough, hemoptysis, dysphasia, odynophagia, melena, hematochezia, dysuria, hematuria, rash, seizure activity, orthopnea, PND, pedal edema, claudication. Remaining systems are negative.  Physical Exam: Well-developed well-nourished in no acute distress.  Skin is warm and dry.  HEENT is normal.  Neck is supple.  Chest is clear to auscultation with normal expansion.  Cardiovascular exam is regular rate and rhythm. 2/6 systolic murmur left sternal border. Abdominal exam nontender or distended. No masses palpated. Extremities show no edema. neuro grossly intact  ECG 10/10/2015-sinus bradycardia, first-degree AV block, lateral T-wave inversion.

## 2015-11-23 ENCOUNTER — Encounter: Payer: Self-pay | Admitting: Cardiology

## 2015-11-23 ENCOUNTER — Ambulatory Visit (INDEPENDENT_AMBULATORY_CARE_PROVIDER_SITE_OTHER): Payer: Medicare Other | Admitting: Cardiology

## 2015-11-23 VITALS — BP 130/60 | HR 60 | Ht 69.0 in | Wt 177.0 lb

## 2015-11-23 DIAGNOSIS — I714 Abdominal aortic aneurysm, without rupture, unspecified: Secondary | ICD-10-CM

## 2015-11-23 DIAGNOSIS — I10 Essential (primary) hypertension: Secondary | ICD-10-CM | POA: Diagnosis not present

## 2015-11-23 DIAGNOSIS — I350 Nonrheumatic aortic (valve) stenosis: Secondary | ICD-10-CM | POA: Diagnosis not present

## 2015-11-23 DIAGNOSIS — I2510 Atherosclerotic heart disease of native coronary artery without angina pectoris: Secondary | ICD-10-CM | POA: Diagnosis not present

## 2015-11-23 DIAGNOSIS — I2583 Coronary atherosclerosis due to lipid rich plaque: Secondary | ICD-10-CM

## 2015-11-23 DIAGNOSIS — E785 Hyperlipidemia, unspecified: Secondary | ICD-10-CM

## 2015-11-23 NOTE — Assessment & Plan Note (Signed)
Blood pressure controlled. Continue present medications. 

## 2015-11-23 NOTE — Patient Instructions (Signed)
Your physician wants you to follow-up in: ONE YEAR WITH DR CRENSHAW You will receive a reminder letter in the mail two months in advance. If you don't receive a letter, please call our office to schedule the follow-up appointment.  

## 2015-11-23 NOTE — Assessment & Plan Note (Signed)
Continue statin. 

## 2015-11-23 NOTE — Assessment & Plan Note (Signed)
Patient with moderate aortic stenosis and aortic insufficiency on recent echo. I have explained he may require aortic valve replacement in the future.He has no symptoms at present. Repeat echocardiogram December 2017.

## 2015-11-23 NOTE — Assessment & Plan Note (Signed)
Continue aspirin and statin. 

## 2015-11-23 NOTE — Assessment & Plan Note (Signed)
Plan follow-up abdominal ultrasound December 2017.

## 2015-12-13 MED FILL — LOSARTAN POTASSIUM 100 MG T: 100 | 90 days supply | Qty: 90 | Fill #1

## 2015-12-29 ENCOUNTER — Other Ambulatory Visit: Payer: Self-pay | Admitting: Physician Assistant

## 2015-12-29 MED FILL — AMLODIPINE BESYLATE 10 MG T: 10 | 90 days supply | Qty: 45 | Fill #0

## 2015-12-29 MED FILL — SPIRONOLACTONE 25 MG TABLET: 25 | 90 days supply | Qty: 45 | Fill #1

## 2016-01-09 ENCOUNTER — Other Ambulatory Visit: Payer: Self-pay | Admitting: Physician Assistant

## 2016-01-09 MED FILL — ATORVASTATIN 80 MG TABLET: 80 | 90 days supply | Qty: 45 | Fill #0

## 2016-01-13 ENCOUNTER — Emergency Department (HOSPITAL_BASED_OUTPATIENT_CLINIC_OR_DEPARTMENT_OTHER): Payer: Medicare Other

## 2016-01-13 ENCOUNTER — Emergency Department (HOSPITAL_BASED_OUTPATIENT_CLINIC_OR_DEPARTMENT_OTHER)
Admission: EM | Admit: 2016-01-13 | Discharge: 2016-01-13 | Disposition: A | Discharge status: Home / Self Care | Payer: Medicare Other | Attending: Emergency Medicine | Admitting: Emergency Medicine

## 2016-01-13 ENCOUNTER — Ambulatory Visit (INDEPENDENT_AMBULATORY_CARE_PROVIDER_SITE_OTHER): Payer: Medicare Other | Admitting: Family

## 2016-01-13 ENCOUNTER — Encounter (HOSPITAL_BASED_OUTPATIENT_CLINIC_OR_DEPARTMENT_OTHER): Payer: Self-pay | Admitting: *Deleted

## 2016-01-13 ENCOUNTER — Telehealth: Payer: Self-pay | Admitting: Cardiology

## 2016-01-13 ENCOUNTER — Telehealth: Payer: Self-pay | Admitting: Physician Assistant

## 2016-01-13 ENCOUNTER — Encounter: Payer: Self-pay | Admitting: Family

## 2016-01-13 VITALS — BP 152/64 | HR 61 | Temp 97.7°F | Ht 69.0 in | Wt 177.0 lb

## 2016-01-13 DIAGNOSIS — Z79899 Other long term (current) drug therapy: Secondary | ICD-10-CM | POA: Diagnosis not present

## 2016-01-13 DIAGNOSIS — Z951 Presence of aortocoronary bypass graft: Secondary | ICD-10-CM | POA: Insufficient documentation

## 2016-01-13 DIAGNOSIS — R0602 Shortness of breath: Secondary | ICD-10-CM | POA: Diagnosis not present

## 2016-01-13 DIAGNOSIS — E785 Hyperlipidemia, unspecified: Secondary | ICD-10-CM | POA: Insufficient documentation

## 2016-01-13 DIAGNOSIS — Z7984 Long term (current) use of oral hypoglycemic drugs: Secondary | ICD-10-CM | POA: Diagnosis not present

## 2016-01-13 DIAGNOSIS — Z8619 Personal history of other infectious and parasitic diseases: Secondary | ICD-10-CM | POA: Insufficient documentation

## 2016-01-13 DIAGNOSIS — R079 Chest pain, unspecified: Secondary | ICD-10-CM

## 2016-01-13 DIAGNOSIS — I519 Heart disease, unspecified: Secondary | ICD-10-CM | POA: Insufficient documentation

## 2016-01-13 DIAGNOSIS — Z87891 Personal history of nicotine dependence: Secondary | ICD-10-CM | POA: Diagnosis not present

## 2016-01-13 DIAGNOSIS — Z8719 Personal history of other diseases of the digestive system: Secondary | ICD-10-CM | POA: Diagnosis not present

## 2016-01-13 DIAGNOSIS — R0789 Other chest pain: Secondary | ICD-10-CM | POA: Diagnosis not present

## 2016-01-13 DIAGNOSIS — E119 Type 2 diabetes mellitus without complications: Secondary | ICD-10-CM | POA: Insufficient documentation

## 2016-01-13 DIAGNOSIS — I2510 Atherosclerotic heart disease of native coronary artery without angina pectoris: Secondary | ICD-10-CM

## 2016-01-13 DIAGNOSIS — Z7982 Long term (current) use of aspirin: Secondary | ICD-10-CM | POA: Insufficient documentation

## 2016-01-13 DIAGNOSIS — I10 Essential (primary) hypertension: Secondary | ICD-10-CM | POA: Insufficient documentation

## 2016-01-13 LAB — CBC WITH DIFFERENTIAL/PLATELET
BASOS PCT: 0 %
Basophils Absolute: 0 10*3/uL (ref 0.0–0.1)
Eosinophils Absolute: 0.1 10*3/uL (ref 0.0–0.7)
Eosinophils Relative: 1 %
HEMATOCRIT: 42 % (ref 39.0–52.0)
HEMOGLOBIN: 14.3 g/dL (ref 13.0–17.0)
LYMPHS ABS: 0.8 10*3/uL (ref 0.7–4.0)
Lymphocytes Relative: 11 %
MCH: 32.5 pg (ref 26.0–34.0)
MCHC: 34 g/dL (ref 30.0–36.0)
MCV: 95.5 fL (ref 78.0–100.0)
MONOS PCT: 10 %
Monocytes Absolute: 0.7 10*3/uL (ref 0.1–1.0)
NEUTROS ABS: 5.7 10*3/uL (ref 1.7–7.7)
NEUTROS PCT: 78 %
Platelets: 169 10*3/uL (ref 150–400)
RBC: 4.4 MIL/uL (ref 4.22–5.81)
RDW: 11.9 % (ref 11.5–15.5)
WBC: 7.3 10*3/uL (ref 4.0–10.5)

## 2016-01-13 LAB — COMPREHENSIVE METABOLIC PANEL
ALBUMIN: 3.7 g/dL (ref 3.5–5.0)
ALK PHOS: 56 U/L (ref 38–126)
ALT: 19 U/L (ref 17–63)
ANION GAP: 9 (ref 5–15)
AST: 23 U/L (ref 15–41)
BILIRUBIN TOTAL: 0.6 mg/dL (ref 0.3–1.2)
BUN: 21 mg/dL — ABNORMAL HIGH (ref 6–20)
CALCIUM: 9.1 mg/dL (ref 8.9–10.3)
CO2: 25 mmol/L (ref 22–32)
CREATININE: 1.09 mg/dL (ref 0.61–1.24)
Chloride: 103 mmol/L (ref 101–111)
GFR calc Af Amer: >60 mL/min (ref >60)
GFR calc non Af Amer: >60 mL/min (ref >60)
GLUCOSE: 126 mg/dL — AB (ref 65–99)
Potassium: 4.3 mmol/L (ref 3.5–5.1)
Sodium: 137 mmol/L (ref 135–145)
TOTAL PROTEIN: 7 g/dL (ref 6.5–8.1)

## 2016-01-13 LAB — I-STAT TROPONIN, ED: Troponin i, poc: 0 ng/mL (ref 0.00–0.08)

## 2016-01-13 LAB — TROPONIN I

## 2016-01-13 NOTE — Patient Instructions (Signed)
Please proceed to the ED for further evaluation.  

## 2016-01-13 NOTE — ED Notes (Signed)
Pt ambulated to restroom without any assistance, seemingly with no problem.

## 2016-01-13 NOTE — Discharge Instructions (Signed)
Take your medicines as prescribed.  See your heart doctor in 1-2 weeks   Return to ER if you have severe chest pain, shortness of breath.

## 2016-01-13 NOTE — Progress Notes (Signed)
Pre visit review using our clinic review tool, if applicable. No additional management support is needed unless otherwise documented below in the visit note. 

## 2016-01-13 NOTE — Telephone Encounter (Signed)
Pt came in to see Debbrah Alar, NP as advised.

## 2016-01-13 NOTE — ED Provider Notes (Signed)
CSN: [id number]     Arrival date & time 01/13/16  [id number] History   First MD Initiated Contact with Patient 01/13/16 1015     Chief Complaint  Patient presents with  . Chest Pain     (Consider location/radiation/quality/duration/timing/severity/associated sxs/prior Treatment) The history is provided by the patient.  Peter Scott is a 82 y.o. male hx of HTN, DM, HL, CAD s/p CABG 2001 here with chest pain. He states that he had an episode of chest tightness 2 nights ago that last about 15 minutes. Denies associated shortness of breath or diaphoresis. Around 1 AM last night, patient had another episode that spontaneously resolved. He thought his blood sugar was low but checked it and was normal. Patient has been followed with Dr. [name] from cardiology and had stress test on 1/16 that was unremarkable. Patient went to PCP and sent here for r/o ACS. Denies current chest pain. Denies leg swelling.    Past Medical History  Diagnosis Date  . Hyperlipidemia   . Hypertension   . Diabetes mellitus type II, controlled ([place])   . Umbilical hernia   . Heart disease     Ablation  . History of chicken pox   . CAD (coronary artery disease)    Past Surgical History  Procedure Laterality Date  . Umbilical hernia repair  2015  . Exploration post operative open heart  2001, June  . Coronary artery bypass graft  2001  . Ablation      Rhythm problem; rhythm unknown; [place]  . Tonsillectomy     Family History  Problem Relation Age of Onset  . Diabetes Mother 70    Deceased  . Heart disease Father 67    Deceased  . Heart disease Maternal Uncle   . Diabetes Maternal Uncle   . Heart disease Brother   . Diabetes Brother   . Diabetes Sister     #1  . Heart disease Sister     #2  . Hyperlipidemia Son     x2   Social History  Substance Use Topics  . Smoking status: Former Research scientist (life sciences)  . Smokeless tobacco: Never Used  . Alcohol Use: No    Review of Systems  Cardiovascular: Positive for  chest pain.  All other systems reviewed and are negative.     Allergies  Review of patient's allergies indicates no known allergies.  Home Medications   Prior to Admission medications   Medication Sig Start Date End Date Taking? Authorizing Provider  amLODipine (NORVASC) 10 MG tablet TAKE 1/2 TABLET BY MOUTH DAILY 12/29/15  Yes Brunetta Jeans, PA-C  aspirin 81 MG tablet Take 81 mg by mouth daily.   Yes Historical Provider, MD  atorvastatin (LIPITOR) 80 MG tablet TAKE 1/2 TABLET BY MOUTH AT BEDTIME 01/09/16  Yes Brunetta Jeans, PA-C  glucose blood (BAYER CONTOUR TEST) test strip TEST ONCE DAILY FOR DIABETES 08/24/15  Yes Brunetta Jeans, PA-C  glyBURIDE (DIABETA) 5 MG tablet Take 0.5 tablets (2.5 mg total) by mouth 2 (two) times daily with a meal. 07/14/14  Yes Debbrah Alar, NP  losartan (COZAAR) 100 MG tablet Take 0.5 tablets (50 mg total) by mouth 2 (two) times daily. 07/06/15  Yes Brunetta Jeans, PA-C  metFORMIN (GLUCOPHAGE) 500 MG tablet Take 2 tablets (1000 mg) by mouth each morning.  Take 1 tablet (500mg ) by mouth each evening. 07/06/15  Yes Brunetta Jeans, PA-C  spironolactone (ALDACTONE) 25 MG tablet Take 0.5 tablets (12.5 mg total) by  mouth daily. 07/06/15  Yes Brunetta Jeans, PA-C   BP 130/66 mmHg  Pulse 50  Temp(Src) 97.8 F (36.6 C) (Oral)  Resp 13  Ht 5\' 9"  (1.753 m)  Wt 177 lb (80.287 kg)  BMI 26.13 kg/m2  SpO2 95% Physical Exam  Constitutional: He is oriented to person, place, and time. He appears well-developed and well-nourished.  HENT:  Head: Normocephalic.  Mouth/Throat: Oropharynx is clear and moist.  Eyes: Conjunctivae are normal. Pupils are equal, round, and reactive to light.  Neck: Normal range of motion. Neck supple.  Cardiovascular: Normal rate, regular rhythm and normal heart sounds.   Pulmonary/Chest: Effort normal and breath sounds normal. No respiratory distress. He has no wheezes. He has no rales. He exhibits no tenderness.  Abdominal:  Soft. Bowel sounds are normal. He exhibits no distension. There is no tenderness. There is no rebound and no guarding.  Musculoskeletal: Normal range of motion. He exhibits no edema or tenderness.  Neurological: He is alert and oriented to person, place, and time. No cranial nerve deficit. Coordination normal.  Skin: Skin is warm and dry.  Psychiatric: He has a normal mood and affect. His behavior is normal. Judgment and thought content normal.  Nursing note and vitals reviewed.   ED Course  Procedures (including critical care time) Labs Review Labs Reviewed  COMPREHENSIVE METABOLIC PANEL - Abnormal; Notable for the following:    Glucose, Bld 126 (*)    BUN 21 (*)    All other components within normal limits  CBC WITH DIFFERENTIAL/PLATELET  TROPONIN I  Randolm Idol, ED    Imaging Review Dg Chest 2 View  01/13/2016  CLINICAL DATA:  Pt having chest tightness off and on for 2 months,sob,x-smoker EXAM: CHEST - 2 VIEW COMPARISON:  None available FINDINGS: Previous CABG. Heart size upper limits normal. No effusion. Relatively low lung volumes with crowding of perihilar and bibasilar bronchovascular structures. No overt edema or confluent airspace disease. No pneumothorax. Spurring in the lower thoracic spine. IMPRESSION: 1. Low lung volumes.  No acute disease post CABG. Electronically Signed   By: Lucrezia Europe M.D.   On: 01/13/2016 10:48   I have personally reviewed and evaluated these images and lab results as part of my medical decision-making.   EKG Interpretation   Date/Time:  Friday January 13 2016 10:09:30 EST Ventricular Rate:  55 PR Interval:  194 QRS Duration: 92 QT Interval:  396 QTC Calculation: 378 R Axis:   17 Text Interpretation:  Sinus bradycardia Possible Anterior infarct , age  undetermined Abnormal ECG No previous ECGs available Confirmed by Kyran Connaughton  MD,  Johanan Skorupski ([id number]) on 01/13/2016 10:15:44 AM      MDM   Final diagnoses:  None   Peter Scott is a 82 y.o.  male here with chest pain. Has hx of CABG but no stents. Chest pain at night for the last 2 nights, none currently. PCP already called cardiology and discussed with Dr. [name], who recommend trop x 1. Given CAD hx and last chest pain 1 am, will get delta trop as well.   1:51 PM Delta trop neg. Labs at baseline. Will dc home.   Wandra Arthurs, MD 01/13/16 1351

## 2016-01-13 NOTE — ED Notes (Addendum)
Sent from from PCP office with c/o chest tightness- hx of ablation- recurrent but worse over last 2 days. Cough also

## 2016-01-13 NOTE — Telephone Encounter (Signed)
Dr [name] discussed pt with Debbrah Alar, she is going to send the pt to the ER to r/o due to chest pain.

## 2016-01-13 NOTE — Telephone Encounter (Signed)
Patient Name: Peter Scott  DOB: 09/24/1932    Initial Comment Caller States he is having discomfort in his chest, shortness of breath, no energy, cough   Nurse Assessment  Nurse: Mallie Mussel, RN, Alveta Heimlich Date/Time (Eastern Time): 01/13/2016 7:22:05 AM  Confirm and document reason for call. If symptomatic, describe symptoms. You must click the next button to save text entered. ---Caller states that he has discomfort in his chest. He describes it as just being tight, not like a heavy pressure. He has a cough which he has had a cough for about 4-5 months but it seems to be getting worse. He is speaking in complete sentences. He has SOB with exertion. He states that he has been so active but does not have any energy now. He denies fever.  Has the patient traveled out of the country within the last 30 days? ---No  Does the patient have any new or worsening symptoms? ---Yes  Will a triage be completed? ---Yes  Related visit to physician within the last 2 weeks? ---No  Does the PT have any chronic conditions? (i.e. diabetes, asthma, etc.) ---Yes  List chronic conditions. ---Diabetes, CABG  Is this a behavioral health or substance abuse call? ---No     Guidelines    Guideline Title Affirmed Question Affirmed Notes  Breathing Difficulty [1] MILD difficulty breathing (e.g., minimal/no SOB at rest, SOB with walking, pulse <100) AND [2] NEW-onset or WORSE than normal    Final Disposition User   See Physician within 4 Hours (or PCP triage) Mallie Mussel, RN, Alveta Heimlich    Comments  There wasn't an appointment with Dr. Hassell Done within the recommended 4 hours. I scheduled an appointment with Debbrah Alar NP for 8:30am this morning.   Referrals  REFERRED TO PCP OFFICE   Disagree/Comply: Comply

## 2016-01-13 NOTE — Progress Notes (Signed)
Subjective:    Patient ID: Peter Scott, male    DOB: 03-02-1932, 82 y.o.   MRN: [id number]  HPI  Peter Scott is an 82 yr old male with hx of CAD, moderate AS/mod AI,  who presents today with report of CP. Reports that CP woke him up from his sleep last night at 1AM. Describes as a tightness/pulling sensation that radiates to the left arm and shoulder blade. Has had some cough/and allergies. Reports that he is more SOB  with exertion the last few months than he was previously. He is limiting his activity- used to like to play golf but no longer wanting to play.  Pt denies numbness/tingling or palpitations.  Did check his sugar when he awoke and it was 86.   Reports that he has lost his desire to do things.    Review of Systems See HPI  Past Medical History  Diagnosis Date  . Hyperlipidemia   . Hypertension   . Diabetes mellitus type II, controlled ([place])   . Umbilical hernia   . Heart disease     Ablation  . History of chicken pox   . CAD (coronary artery disease)     Social History   Social History  . Marital Status: Married    Spouse Name: N/A  . Number of Children: 2  . Years of Education: N/A   Occupational History  . Not on file.   Social History Main Topics  . Smoking status: Former Research scientist (life sciences)  . Smokeless tobacco: Never Used  . Alcohol Use: No  . Drug Use: No  . Sexual Activity: Not on file   Other Topics Concern  . Not on file   Social History Narrative    Past Surgical History  Procedure Laterality Date  . Umbilical hernia repair  2015  . Exploration post operative open heart  2001, June  . Coronary artery bypass graft  2001  . Ablation      Rhythm problem; rhythm unknown; [place]  . Tonsillectomy      Family History  Problem Relation Age of Onset  . Diabetes Mother 11    Deceased  . Heart disease Father 66    Deceased  . Heart disease Maternal Uncle   . Diabetes Maternal Uncle   . Heart disease Brother   . Diabetes Brother   .  Diabetes Sister     #1  . Heart disease Sister     #2  . Hyperlipidemia Son     x2    No Known Allergies  Current Outpatient Prescriptions on File Prior to Visit  Medication Sig Dispense Refill  . amLODipine (NORVASC) 10 MG tablet TAKE 1/2 TABLET BY MOUTH DAILY 45 tablet 0  . aspirin 81 MG tablet Take 81 mg by mouth daily.    Marland Kitchen atorvastatin (LIPITOR) 80 MG tablet TAKE 1/2 TABLET BY MOUTH AT BEDTIME 45 tablet 1  . glucose blood (BAYER CONTOUR TEST) test strip TEST ONCE DAILY FOR DIABETES 100 each 5  . glyBURIDE (DIABETA) 5 MG tablet Take 0.5 tablets (2.5 mg total) by mouth 2 (two) times daily with a meal. 90 tablet 1  . losartan (COZAAR) 100 MG tablet Take 0.5 tablets (50 mg total) by mouth 2 (two) times daily. 90 tablet 1  . metFORMIN (GLUCOPHAGE) 500 MG tablet Take 2 tablets (1000 mg) by mouth each morning.  Take 1 tablet (500mg ) by mouth each evening. 270 tablet 1  . spironolactone (ALDACTONE) 25 MG tablet Take 0.5  tablets (12.5 mg total) by mouth daily. 45 tablet 1   No current facility-administered medications on file prior to visit.    BP 152/64 mmHg  Pulse 61  Temp(Src) 97.7 F (36.5 C) (Oral)  Ht 5\' 9"  (1.753 m)  Wt 177 lb (80.287 kg)  BMI 26.13 kg/m2  SpO2 98%       Objective:   Physical Exam  Constitutional: He is oriented to person, place, and time. He appears well-developed and well-nourished. No distress.  HENT:  Head: Normocephalic and atraumatic.  Cardiovascular: Normal rate and regular rhythm.   Murmur heard. Pulmonary/Chest: Effort normal and breath sounds normal. No respiratory distress. He has no wheezes. He has no rales.  Musculoskeletal: He exhibits no edema.  Neurological: He is alert and oriented to person, place, and time.  Skin: Skin is warm and dry.  Psychiatric: He has a normal mood and affect. His behavior is normal. Thought content normal.          Assessment & Plan:  CP/DOE- denies current CP EKG reviewed and compared to prior- no  obvious acute changed noted.  Spoke with Dr. [name] pt's cardiologist and he suggested that pt be evaluated to rule out MI. We would expect troponin to be positive if he had a cardiac event at 1AM.  Pt is agreeable to proceed to the ED.  Report given to ED physician on Duty (Dr. Blenda Nicely).    We did discuss some of his adjustment issues and possible depression (moved from [place] where he lived for 80 yrs).  Advised pt to come speak further with PCP after cardiac evaluation.

## 2016-01-13 NOTE — ED Notes (Signed)
MD at bedside. 

## 2016-01-30 ENCOUNTER — Other Ambulatory Visit: Payer: Self-pay | Admitting: Physician Assistant

## 2016-01-30 MED FILL — metFORMIN HCL 500 MG TABS: 500 | 90 days supply | Qty: 270 | Fill #0

## 2016-01-31 ENCOUNTER — Telehealth: Payer: Self-pay | Admitting: *Deleted

## 2016-01-31 NOTE — Telephone Encounter (Signed)
Received request for Diabetic supplies via [place] processing; completed and faxed to [phone] 03/14

## 2016-02-28 DIAGNOSIS — L570 Actinic keratosis: Secondary | ICD-10-CM | POA: Diagnosis not present

## 2016-02-28 DIAGNOSIS — L923 Foreign body granuloma of the skin and subcutaneous tissue: Secondary | ICD-10-CM | POA: Diagnosis not present

## 2016-03-13 ENCOUNTER — Other Ambulatory Visit: Payer: Self-pay | Admitting: Physician Assistant

## 2016-03-13 MED FILL — LOSARTAN POTASSIUM 100 MG T: 100 | 90 days supply | Qty: 90 | Fill #0

## 2016-03-26 ENCOUNTER — Other Ambulatory Visit: Payer: Self-pay | Admitting: Physician Assistant

## 2016-03-26 MED FILL — SPIRONOLACTONE 25 MG TABLET: 25 | 90 days supply | Qty: 45 | Fill #0

## 2016-03-26 NOTE — Telephone Encounter (Signed)
Rx sent to the pharmacy by e-script.//AB/CMA 

## 2016-04-03 ENCOUNTER — Ambulatory Visit: Payer: Medicare Other | Admitting: Physician Assistant

## 2016-04-04 MED FILL — ATORVASTATIN 80 MG TABLET: 80 | 90 days supply | Qty: 45 | Fill #1

## 2016-04-10 ENCOUNTER — Encounter: Payer: Self-pay | Admitting: Physician Assistant

## 2016-04-10 ENCOUNTER — Ambulatory Visit (INDEPENDENT_AMBULATORY_CARE_PROVIDER_SITE_OTHER): Payer: Medicare Other | Admitting: Physician Assistant

## 2016-04-10 VITALS — BP 120/60 | HR 53 | Temp 97.6°F | Ht 69.0 in | Wt 174.0 lb

## 2016-04-10 DIAGNOSIS — E119 Type 2 diabetes mellitus without complications: Secondary | ICD-10-CM

## 2016-04-10 DIAGNOSIS — I10 Essential (primary) hypertension: Secondary | ICD-10-CM | POA: Diagnosis not present

## 2016-04-10 DIAGNOSIS — I2510 Atherosclerotic heart disease of native coronary artery without angina pectoris: Secondary | ICD-10-CM

## 2016-04-10 LAB — BASIC METABOLIC PANEL
BUN: 19 mg/dL (ref 6–23)
CHLORIDE: 103 meq/L (ref 96–112)
CO2: 28 meq/L (ref 19–32)
CREATININE: 1.12 mg/dL (ref 0.40–1.50)
Calcium: 9.6 mg/dL (ref 8.4–10.5)
GFR: 66.69 mL/min (ref >60.00)
GLUCOSE: 94 mg/dL (ref 70–99)
Potassium: 4.1 mEq/L (ref 3.5–5.1)
Sodium: 137 mEq/L (ref 135–145)

## 2016-04-10 LAB — HEMOGLOBIN A1C: Hgb A1c MFr Bld: 7.7 % — ABNORMAL HIGH (ref 4.6–6.5)

## 2016-04-10 MED ORDER — GLYBURIDE 5 MG PO TABS: 2.5000 mg | ORAL_TABLET | Freq: Two times a day (BID) | ORAL | Status: DC

## 2016-04-10 NOTE — Progress Notes (Signed)
Patient presents to clinic today for follow-up of hypertension and diabetes mellitus II, previously controlled.  Hypertension -- Is taking medications as directed. Patient denies chest pain, palpitations, lightheadedness, dizziness, vision changes or frequent headaches. Does note some mild fatigue. Is not checking BP at home.   BP Readings from Last 3 Encounters:  04/10/16 120/60  01/13/16 132/64  01/13/16 152/64   Hyperlipidemia -- Endorses taking medications as directed. Currently on Lipitor and 81 mg ASA. Denies myalgias. Is exercising 3-5 x per week at home and the gym. Is trying to watch diet.   DM II -- Last A1C at 7.7. Endorses taking medications as directed. Fasting sugars averaging 90-100. Is staying active and not skipping meals. Is up-to-date on eye exam and foot examination.  Past Medical History  Diagnosis Date  . Hyperlipidemia   . Hypertension   . Diabetes mellitus type II, controlled ([place])   . Umbilical hernia   . Heart disease     Ablation  . History of chicken pox   . CAD (coronary artery disease)     Current Outpatient Prescriptions on File Prior to Visit  Medication Sig Dispense Refill  . amLODipine (NORVASC) 10 MG tablet TAKE 1/2 TABLET BY MOUTH DAILY 45 tablet 0  . aspirin 81 MG tablet Take 81 mg by mouth daily.    Marland Kitchen atorvastatin (LIPITOR) 80 MG tablet TAKE 1/2 TABLET BY MOUTH AT BEDTIME 45 tablet 1  . glucose blood (BAYER CONTOUR TEST) test strip TEST ONCE DAILY FOR DIABETES 100 each 5  . glyBURIDE (DIABETA) 5 MG tablet Take 0.5 tablets (2.5 mg total) by mouth 2 (two) times daily with a meal. 90 tablet 1  . losartan (COZAAR) 100 MG tablet TAKE 1/2 TABLET BY MOUTH 2 TIMES A DAY 90 tablet 0  . metFORMIN (GLUCOPHAGE) 500 MG tablet TAKE 2 TABLETS BY MOUTH EVERY MORNING AND 1 TABLET IN THE EVENING 270 tablet 0  . spironolactone (ALDACTONE) 25 MG tablet TAKE 1/2 TABLET BY MOUTH DAILY 45 tablet 1   No current facility-administered medications on file prior to  visit.    No Known Allergies  Family History  Problem Relation Age of Onset  . Diabetes Mother 67    Deceased  . Heart disease Father 28    Deceased  . Heart disease Maternal Uncle   . Diabetes Maternal Uncle   . Heart disease Brother   . Diabetes Brother   . Diabetes Sister     #1  . Heart disease Sister     #2  . Hyperlipidemia Son     x2    Social History   Social History  . Marital Status: Married    Spouse Name: N/A  . Number of Children: 2  . Years of Education: N/A   Social History Main Topics  . Smoking status: Former Research scientist (life sciences)  . Smokeless tobacco: Never Used  . Alcohol Use: No  . Drug Use: No  . Sexual Activity: Not Asked   Other Topics Concern  . None   Social History Narrative   Review of Systems - See HPI.  All other ROS are negative.  BP 120/60 mmHg  Pulse 53  Temp(Src) 97.6 F (36.4 C) (Oral)  Ht '5\' 9"'$  (1.753 m)  Wt 174 lb (78.926 kg)  BMI 25.68 kg/m2  SpO2 98%  Physical Exam  Constitutional: He is oriented to person, place, and time and well-developed, well-nourished, and in no distress.  HENT:  Head: Normocephalic and atraumatic.  Eyes:  Conjunctivae are normal.  Neck: Neck supple.  Cardiovascular: Normal rate, regular rhythm, normal heart sounds and intact distal pulses.   Pulmonary/Chest: Effort normal and breath sounds normal. No respiratory distress. He has no wheezes. He has no rales. He exhibits no tenderness.  Neurological: He is alert and oriented to person, place, and time.  Skin: Skin is warm and dry. No rash noted.  Psychiatric: Affect normal.  Vitals reviewed.   Recent Results (from the past 2160 hour(s))  CBC with Differential     Status: None   Collection Time: 01/13/16 10:40 AM  Result Value Ref Range   WBC 7.3 4.0 - 10.5 K/uL   RBC 4.40 4.22 - 5.81 MIL/uL   Hemoglobin 14.3 13.0 - 17.0 g/dL   HCT 84.6 17.8 - 20.9 %   MCV 95.5 78.0 - 100.0 fL   MCH 32.5 26.0 - 34.0 pg   MCHC 34.0 30.0 - 36.0 g/dL   RDW 38.8 63.8  - 15.1 %   Platelets 169 150 - 400 K/uL   Neutrophils Relative % 78 %   Neutro Abs 5.7 1.7 - 7.7 K/uL   Lymphocytes Relative 11 %   Lymphs Abs 0.8 0.7 - 4.0 K/uL   Monocytes Relative 10 %   Monocytes Absolute 0.7 0.1 - 1.0 K/uL   Eosinophils Relative 1 %   Eosinophils Absolute 0.1 0.0 - 0.7 K/uL   Basophils Relative 0 %   Basophils Absolute 0.0 0.0 - 0.1 K/uL  Comprehensive metabolic panel     Status: Abnormal   Collection Time: 01/13/16 10:40 AM  Result Value Ref Range   Sodium 137 135 - 145 mmol/L   Potassium 4.3 3.5 - 5.1 mmol/L   Chloride 103 101 - 111 mmol/L   CO2 25 22 - 32 mmol/L   Glucose, Bld 126 (H) 65 - 99 mg/dL   BUN 21 (H) 6 - 20 mg/dL   Creatinine, Ser 6.55 0.61 - 1.24 mg/dL   Calcium 9.1 8.9 - 71.3 mg/dL   Total Protein 7.0 6.5 - 8.1 g/dL   Albumin 3.7 3.5 - 5.0 g/dL   AST 23 15 - 41 U/L   ALT 19 17 - 63 U/L   Alkaline Phosphatase 56 38 - 126 U/L   Total Bilirubin 0.6 0.3 - 1.2 mg/dL   GFR calc non Af Amer >60 >60 mL/min   GFR calc Af Amer >60 >60 mL/min    Comment: (NOTE) The eGFR has been calculated using the CKD EPI equation. This calculation has not been validated in all clinical situations. eGFR's persistently <60 mL/min signify possible Chronic Kidney Disease.    Anion gap 9 5 - 15  I-stat troponin, ED     Status: None   Collection Time: 01/13/16 10:54 AM  Result Value Ref Range   Troponin i, poc 0.00 0.00 - 0.08 ng/mL   Comment 3            Comment: Due to the release kinetics of cTnI, a negative result within the first hours of the onset of symptoms does not rule out myocardial infarction with certainty. If myocardial infarction is still suspected, repeat the test at appropriate intervals.   Troponin I     Status: None   Collection Time: 01/13/16  1:00 PM  Result Value Ref Range   Troponin I <0.03 <0.031 ng/mL    Comment:        NO INDICATION OF MYOCARDIAL INJURY.     Assessment/Plan: 1. Type II diabetes mellitus, well  controlled  ([place]) Will check BMP and A1C. Continue medications as directed. Continue diet and exercise regimen. - Basic Metabolic Panel (BMET) - Hemoglobin A1c  2. Essential hypertension, benign BP lower end today. Notes some fatigue. Will continue Norvasc and Aldactone as prescribed. Will decreased Losartan to QD from BID. FU1 month. Will check labs today. - Basic Metabolic Panel (BMET)

## 2016-04-10 NOTE — Progress Notes (Signed)
Pre visit review using our clinic review tool, if applicable. No additional management support is needed unless otherwise documented below in the visit note. 

## 2016-04-10 NOTE — Patient Instructions (Signed)
Please go to the lab for blood work. I will call you with your results. Continue medications as directed for now with the following exception:  -- The Losartan is to be taken once daily in the evening.  Make sure to eat a bed time snack. Your reported sugar levels are good so I would be surprised if your A1C has not improved.

## 2016-04-17 ENCOUNTER — Other Ambulatory Visit: Payer: Self-pay | Admitting: Physician Assistant

## 2016-04-17 MED FILL — AMLODIPINE BESYLATE 10 MG T: 10 | 90 days supply | Qty: 45 | Fill #0

## 2016-04-17 NOTE — Telephone Encounter (Signed)
Refill sent per [facility] refill protocol/SLS  

## 2016-04-25 ENCOUNTER — Other Ambulatory Visit: Payer: Self-pay | Admitting: Physician Assistant

## 2016-04-25 MED FILL — metFORMIN HCL 500 MG TABS: 500 | 90 days supply | Qty: 270 | Fill #0

## 2016-06-20 ENCOUNTER — Other Ambulatory Visit: Payer: Self-pay | Admitting: Physician Assistant

## 2016-06-20 MED FILL — LOSARTAN POTASSIUM 100 MG T: 100 | 90 days supply | Qty: 90 | Fill #0

## 2016-06-25 MED FILL — SPIRONOLACTONE 25 MG TABLET: 25 | 90 days supply | Qty: 45 | Fill #1

## 2016-07-09 ENCOUNTER — Other Ambulatory Visit: Payer: Self-pay | Admitting: Physician Assistant

## 2016-07-09 MED FILL — ATORVASTATIN 80 MG TABLET: 80 | 90 days supply | Qty: 45 | Fill #0

## 2016-07-10 ENCOUNTER — Other Ambulatory Visit: Payer: Self-pay | Admitting: Physician Assistant

## 2016-07-10 MED FILL — AMLODIPINE BESYLATE 10 MG T: 10 | 90 days supply | Qty: 45 | Fill #0

## 2016-07-27 ENCOUNTER — Other Ambulatory Visit: Payer: Self-pay | Admitting: Physician Assistant

## 2016-07-27 MED FILL — metFORMIN HCL 500 MG TABS: 500 | 90 days supply | Qty: 270 | Fill #0

## 2016-08-27 ENCOUNTER — Other Ambulatory Visit: Payer: Self-pay | Admitting: *Deleted

## 2016-08-27 ENCOUNTER — Ambulatory Visit (INDEPENDENT_AMBULATORY_CARE_PROVIDER_SITE_OTHER): Payer: Medicare Other

## 2016-08-27 DIAGNOSIS — Z23 Encounter for immunization: Secondary | ICD-10-CM

## 2016-08-27 MED ORDER — AMLODIPINE BESYLATE 10 MG PO TABS: 5.0000 mg | ORAL_TABLET | Freq: Every day | ORAL | 1 refills | Status: DC

## 2016-08-27 MED ORDER — SPIRONOLACTONE 25 MG PO TABS: 12.5000 mg | ORAL_TABLET | Freq: Every day | ORAL | 1 refills | Status: DC

## 2016-08-27 MED ORDER — ATORVASTATIN CALCIUM 80 MG PO TABS: 40.0000 mg | ORAL_TABLET | Freq: Every day | ORAL | 1 refills | Status: DC

## 2016-08-27 MED ORDER — LOSARTAN POTASSIUM 100 MG PO TABS: ORAL_TABLET | ORAL | 1 refills | Status: DC

## 2016-08-27 MED ORDER — METFORMIN HCL 500 MG PO TABS: ORAL_TABLET | ORAL | 1 refills | Status: DC

## 2016-08-27 NOTE — Progress Notes (Signed)
Per patient request, Rx sent to [facility] for 90-day supply/SLS 10/09

## 2016-08-28 DIAGNOSIS — L570 Actinic keratosis: Secondary | ICD-10-CM | POA: Diagnosis not present

## 2016-09-07 ENCOUNTER — Telehealth: Payer: Self-pay | Admitting: Physician Assistant

## 2016-09-07 NOTE — Telephone Encounter (Signed)
Ok with me 

## 2016-09-07 NOTE — Telephone Encounter (Signed)
ok 

## 2016-09-07 NOTE — Telephone Encounter (Signed)
Patient would like to transfer care from [facility] to Dr. [name]

## 2016-09-07 NOTE — Telephone Encounter (Signed)
Pt dropped off copies of advance directive documents, documents placed tray at front office

## 2016-09-14 ENCOUNTER — Other Ambulatory Visit: Payer: Self-pay | Admitting: Physician Assistant

## 2016-09-14 MED FILL — SPIRONOLACTONE 25 MG TABLET: 25 | 30 days supply | Qty: 15 | Fill #0

## 2016-09-14 NOTE — Telephone Encounter (Signed)
I have refilled Rx until patient comes in to establish care with new provider. Noted to the pharmacy that further refills would need to be addressed by new PCP. TL/CMA

## 2016-09-26 MED FILL — LOSARTAN POTASSIUM 100 MG T: 100 | 90 days supply | Qty: 90 | Fill #1

## 2016-09-26 MED FILL — SPIRONOLACTONE 25 MG TABLET: 25 | 30 days supply | Qty: 15 | Fill #1

## 2016-10-03 ENCOUNTER — Other Ambulatory Visit: Payer: Self-pay | Admitting: Physician Assistant

## 2016-10-04 ENCOUNTER — Telehealth: Payer: Self-pay | Admitting: Physician Assistant

## 2016-10-04 ENCOUNTER — Other Ambulatory Visit: Payer: Self-pay | Admitting: Emergency Medicine

## 2016-10-04 MED ORDER — METFORMIN HCL 500 MG PO TABS: ORAL_TABLET | ORAL | 1 refills | Status: DC

## 2016-10-04 MED ORDER — AMLODIPINE BESYLATE 10 MG PO TABS: 5.0000 mg | ORAL_TABLET | Freq: Every day | ORAL | 1 refills | Status: DC

## 2016-10-04 MED ORDER — ATORVASTATIN CALCIUM 80 MG PO TABS: 40.0000 mg | ORAL_TABLET | Freq: Every day | ORAL | 1 refills | Status: DC

## 2016-10-04 MED ORDER — SPIRONOLACTONE 25 MG PO TABS: 12.5000 mg | ORAL_TABLET | Freq: Every day | ORAL | 1 refills | Status: DC

## 2016-10-04 MED ORDER — LOSARTAN POTASSIUM 100 MG PO TABS: ORAL_TABLET | ORAL | 1 refills | Status: DC

## 2016-10-04 MED FILL — AMLODIPINE BESYLATE 10 MG T: 10 | 90 days supply | Qty: 45 | Fill #0

## 2016-10-04 MED FILL — ATORVASTATIN 80 MG TABLET: 80 | 90 days supply | Qty: 45 | Fill #0

## 2016-10-04 NOTE — Telephone Encounter (Signed)
° °  Relation to [id number] Call back [phone] Pharmacy:[facility] [place] [facility] [address]  Reason for call: pt has appt on 10/29/16 to transfer to Dr. Lorelei Scott, pt states he will run out of meds before his appt and is requesting Peter Scott send in his prescriptions to get him through until he transfer to Dr. Lorelei Scott. Pt states that [facility] will expirre before the 10/29/16 and he has one rx glyburide that needs to [facility] [address] in [place] and the rest will go to [facility] [place]. Pt would like a call back with confirmation that rx has been sent

## 2016-10-04 NOTE — Progress Notes (Signed)
Notified patient of medications sent to pharmacy.

## 2016-10-04 NOTE — Telephone Encounter (Signed)
Refills sent to pharmacies as patient requested. He has been notified of medications sent

## 2016-10-22 ENCOUNTER — Telehealth: Payer: Self-pay | Admitting: *Deleted

## 2016-10-22 DIAGNOSIS — I714 Abdominal aortic aneurysm, without rupture, unspecified: Secondary | ICD-10-CM

## 2016-10-22 NOTE — Telephone Encounter (Signed)
Left message for pt to call, he is due to have a repeat US of the abdomen to f/u on AAA. He does his scan in the [place] office and can call [phone] to schedule at his convenience after 10-25-16.

## 2016-10-23 MED FILL — metFORMIN HCL 500 MG TABS: 500 | 90 days supply | Qty: 270 | Fill #0

## 2016-10-26 NOTE — Telephone Encounter (Signed)
Follow up scheduled for [place]

## 2016-10-29 ENCOUNTER — Encounter: Payer: Self-pay | Admitting: Family Medicine

## 2016-10-29 ENCOUNTER — Ambulatory Visit (INDEPENDENT_AMBULATORY_CARE_PROVIDER_SITE_OTHER): Payer: Medicare Other | Admitting: Family Medicine

## 2016-10-29 VITALS — BP 156/61 | HR 58 | Temp 98.0°F | Ht 69.0 in | Wt 176.8 lb

## 2016-10-29 DIAGNOSIS — I10 Essential (primary) hypertension: Secondary | ICD-10-CM

## 2016-10-29 DIAGNOSIS — E785 Hyperlipidemia, unspecified: Secondary | ICD-10-CM | POA: Diagnosis not present

## 2016-10-29 DIAGNOSIS — Z0000 Encounter for general adult medical examination without abnormal findings: Secondary | ICD-10-CM

## 2016-10-29 DIAGNOSIS — E119 Type 2 diabetes mellitus without complications: Secondary | ICD-10-CM | POA: Diagnosis not present

## 2016-10-29 LAB — LIPID PANEL
CHOL/HDL RATIO: 4
CHOLESTEROL: 138 mg/dL (ref 0–200)
HDL: 38 mg/dL — AB (ref >39.00)
LDL CALC: 84 mg/dL (ref 0–99)
NonHDL: 100.36
TRIGLYCERIDES: 82 mg/dL (ref 0.0–149.0)
VLDL: 16.4 mg/dL (ref 0.0–40.0)

## 2016-10-29 LAB — COMPREHENSIVE METABOLIC PANEL
ALT: 17 U/L (ref 0–53)
AST: 17 U/L (ref 0–37)
Albumin: 3.8 g/dL (ref 3.5–5.2)
Alkaline Phosphatase: 50 U/L (ref 39–117)
BUN: 21 mg/dL (ref 6–23)
CALCIUM: 9.4 mg/dL (ref 8.4–10.5)
CHLORIDE: 101 meq/L (ref 96–112)
CO2: 29 meq/L (ref 19–32)
Creatinine, Ser: 1.2 mg/dL (ref 0.40–1.50)
GFR: 61.5 mL/min (ref >60.00)
Glucose, Bld: 106 mg/dL — ABNORMAL HIGH (ref 70–99)
POTASSIUM: 4.3 meq/L (ref 3.5–5.1)
Sodium: 137 mEq/L (ref 135–145)
Total Bilirubin: 0.5 mg/dL (ref 0.2–1.2)
Total Protein: 6.8 g/dL (ref 6.0–8.3)

## 2016-10-29 LAB — HEMOGLOBIN A1C: HEMOGLOBIN A1C: 7.7 % — AB (ref 4.6–6.5)

## 2016-10-29 NOTE — Progress Notes (Signed)
[place] at [facility] [address] [place], [place] [address] [phone]  Date:  10/29/2016   Name:  Peter Scott   DOB:  [birth year]   MRN:  [id number]  PCP:  Leeanne Rio, PA-C    Chief Complaint: Annual Exam (Former pt of Peter Scott. )   History of Present Illness:  Peter Scott is a 82 y.o. very pleasant male patient who presents with the following:  Here today as a new patient to me, but known to this practice.   He has a history of AAA, skin cancer, HTN, DM, dyslipidemia, CAD Lab Results  Component Value Date   HGBA1C 7.7 (H) 04/10/2016   Last labs in May of this year.  We are doing a CPE today He did eat just a little bit of food this am.  He had CABG in 2001, and an ablation for a fib about one month later.  His cardiologist is Dr. [name]. He has an Korea to monitor his AAA tomorrow- it has been stable for 10 years.   Last AAA Korea last December  IMPRESSION: Stable mid to distal abdominal aortic aneurysm again noted. Maximum diameter is 3.8 cm. No significant change from prior exam.  He was an Chief Financial Officer- Therapist, music. He was in [employer]; he as stationed mostly in [place], and worked with [employer] He and his wife moved here from [place] about 2 years ago to be closer to their children.   They are getting used to it, but it was a big adjustment.    His son has about 180 acre farm- they enjoy helping there They have 2 sons, 2 granddaughters.  One is out of college, the other is in HS.    He does check his BP at home- his afternoon readings are generally 120/80, but his morning readings are often higher  BP Readings from Last 3 Encounters:  10/29/16 (!) 156/61  04/10/16 120/60  01/13/16 132/64     Patient Active Problem List   Diagnosis Date Noted  . Medicare annual wellness visit, subsequent 10/04/2014  . Aortic stenosis 09/29/2014  . Bruit 09/29/2014  . S/P gastric bypass 07/23/2014  .  Abdominal aortic aneurysm ([place]) 07/23/2014  . BCC (basal cell carcinoma) 07/23/2014  . SCC (squamous cell carcinoma), arm 07/23/2014  . Essential hypertension, benign 07/08/2014  . Coronary artery disease 07/08/2014  . Hyperlipidemia LDL goal <100 07/08/2014  . Type II diabetes mellitus, well controlled ([place]) 07/08/2014  . Colon cancer screening 07/08/2014    Past Medical History:  Diagnosis Date  . CAD (coronary artery disease)   . Diabetes mellitus type II, controlled ([place])   . Heart disease    Ablation  . History of chicken pox   . Hyperlipidemia   . Hypertension   . Umbilical hernia     Past Surgical History:  Procedure Laterality Date  . ABLATION     Rhythm problem; rhythm unknown; [place] GRAFT  2001  . EXPLORATION POST OPERATIVE OPEN HEART  2001, June  . TONSILLECTOMY    . UMBILICAL HERNIA REPAIR  2015    Social History  Substance Use Topics  . Smoking status: Former Research scientist (life sciences)  . Smokeless tobacco: Never Used  . Alcohol use No    Family History  Problem Relation Age of Onset  . Diabetes Mother 3    Deceased  . Heart disease Father 12  Deceased  . Heart disease Maternal Uncle   . Diabetes Maternal Uncle   . Heart disease Brother   . Diabetes Brother   . Diabetes Sister     #1  . Heart disease Sister     #2  . Hyperlipidemia Son     x2    No Known Allergies  Medication list has been reviewed and updated.  Current Outpatient Prescriptions on File Prior to Visit  Medication Sig Dispense Refill  . amLODipine (NORVASC) 10 MG tablet Take 0.5 tablets (5 mg total) by mouth daily. 45 tablet 1  . aspirin 81 MG tablet Take 81 mg by mouth daily.    Marland Kitchen atorvastatin (LIPITOR) 80 MG tablet Take 0.5 tablets (40 mg total) by mouth at bedtime. 45 tablet 1  . glucose blood (BAYER CONTOUR TEST) test strip TEST ONCE DAILY FOR DIABETES 100 each 5  . glyBURIDE (DIABETA) 5 MG tablet TAKE 0.5 TABLETS BY MOUTH TWICE DAILY WITH MEALS 90  tablet 1  . losartan (COZAAR) 100 MG tablet TAKE 1/2 TABLET BY MOUTH 2 TIMES A DAY 90 tablet 1  . metFORMIN (GLUCOPHAGE) 500 MG tablet TAKE 2 TABLETS BY MOUTH EVERY MORNING THEN TAKE 1 TABLET EVERY EVENING 270 tablet 1  . spironolactone (ALDACTONE) 25 MG tablet Take 0.5 tablets (12.5 mg total) by mouth daily. 45 tablet 1   No current facility-administered medications on file prior to visit.     Review of Systems:  As per HPI- otherwise negative.   Physical Examination: Vitals:   10/29/16 0816 10/29/16 0825  BP: (!) 164/62 (!) 156/61  Pulse: (!) 58   Temp: 98 F (36.7 C)    Vitals:   10/29/16 0816  Weight: 176 lb 12.8 oz (80.2 kg)  Height: 5\' 9"  (1.753 m)   Body mass index is 26.11 kg/m. Ideal Body Weight: Weight in (lb) to have BMI = 25: 168.9  GEN: WDWN, NAD, Non-toxic, A & O x 3, he looks very well and spry for his age.   HEENT: Atraumatic, Normocephalic. Neck supple. No masses, No LAD.  Bilateral TM wnl, oropharynx normal.  PEERL,EOMI.   Ears and Nose: No external deformity. CV: RRR, No M/G/R. No JVD. No thrill. No extra heart sounds. PULM: CTA B, no wheezes, crackles, rhonchi. No retractions. No resp. distress. No accessory muscle use. ABD: S, NT, ND. No rebound. No HSM. EXTR: No c/c/e NEURO Normal gait.  PSYCH: Normally interactive. Conversant. Not depressed or anxious appearing.  Calm demeanor.  Foot exam is normal    Assessment and Plan: Physical exam  Type II diabetes mellitus, well controlled ([place]) - Plan: Comprehensive metabolic panel, Hemoglobin A1c  Essential hypertension, benign  Hyperlipidemia LDL goal <100 - Plan: Lipid panel  Here today to for a CPE He is having his annual AAA Korea tomorrow Will check A1c today to monitor his DM His BP is a bit high today- however his DBP is borderline low so we are limited as to how far we can increase his BP meds. For the time being will continue to montor Will plan further follow- up pending labs. Plan recheck  in 6 months    Signed Lamar Blinks, MD

## 2016-10-29 NOTE — Progress Notes (Signed)
Pre visit review using our clinic review tool, if applicable. No additional management support is needed unless otherwise documented below in the visit note. 

## 2016-10-29 NOTE — Patient Instructions (Signed)
It was a pleasure to see you today- take care and I will be in touch with your labs asap Please continue to monitor your BP at home; if you are running higher than 150/90 on a regular basis please do let me know; in that case we might increase your amlodipine to 5mg  twice a day Please plan to see me in about 6 months

## 2016-10-30 ENCOUNTER — Ambulatory Visit (HOSPITAL_BASED_OUTPATIENT_CLINIC_OR_DEPARTMENT_OTHER)
Admission: RE | Admit: 2016-10-30 | Discharge: 2016-10-30 | Disposition: A | Discharge status: Home / Self Care | Payer: Medicare Other | Source: Ambulatory Visit | Attending: Cardiology | Admitting: Cardiology

## 2016-10-30 DIAGNOSIS — I714 Abdominal aortic aneurysm, without rupture, unspecified: Secondary | ICD-10-CM

## 2016-11-20 MED FILL — SPIRONOLACTONE 25 MG TABLET: 25 | 90 days supply | Qty: 45 | Fill #0

## 2016-11-28 ENCOUNTER — Other Ambulatory Visit: Payer: Self-pay | Admitting: Physician Assistant

## 2016-12-26 MED FILL — LOSARTAN POTASSIUM 100 MG T: 100 | 90 days supply | Qty: 90 | Fill #0

## 2016-12-26 MED FILL — ATORVASTATIN 80 MG TABLET: 80 | 90 days supply | Qty: 45 | Fill #1

## 2017-01-15 ENCOUNTER — Telehealth: Payer: Self-pay | Admitting: Physician Assistant

## 2017-01-15 MED FILL — AMLODIPINE BESYLATE 10 MG T: 10 | 90 days supply | Qty: 45 | Fill #1

## 2017-01-15 NOTE — Telephone Encounter (Signed)
10/10/15 PR PPPS, SUBSEQ VISIT [id number] patient scheduled for 04/29/17 at 8am with [facility].

## 2017-01-25 MED FILL — metFORMIN HCL 500 MG TABS: 500 | 90 days supply | Qty: 270 | Fill #1

## 2017-02-18 MED FILL — SPIRONOLACTONE 25 MG TABLET: 25 | 90 days supply | Qty: 45 | Fill #1

## 2017-02-21 DIAGNOSIS — L719 Rosacea, unspecified: Secondary | ICD-10-CM | POA: Diagnosis not present

## 2017-02-21 DIAGNOSIS — L570 Actinic keratosis: Secondary | ICD-10-CM | POA: Diagnosis not present

## 2017-02-21 DIAGNOSIS — D0462 Carcinoma in situ of skin of left upper limb, including shoulder: Secondary | ICD-10-CM | POA: Diagnosis not present

## 2017-03-21 DIAGNOSIS — H6123 Impacted cerumen, bilateral: Secondary | ICD-10-CM | POA: Diagnosis not present

## 2017-03-27 ENCOUNTER — Other Ambulatory Visit: Payer: Self-pay | Admitting: Physician Assistant

## 2017-04-01 ENCOUNTER — Other Ambulatory Visit: Payer: Self-pay | Admitting: Emergency Medicine

## 2017-04-01 MED ORDER — GLUCOSE BLOOD VI STRP: ORAL_STRIP | 9 refills | Status: DC

## 2017-04-01 MED FILL — LOSARTAN POTASSIUM 100 MG T: 100 | 90 days supply | Qty: 90 | Fill #1

## 2017-04-03 ENCOUNTER — Other Ambulatory Visit: Payer: Self-pay | Admitting: Emergency Medicine

## 2017-04-05 ENCOUNTER — Other Ambulatory Visit: Payer: Self-pay | Admitting: *Deleted

## 2017-04-05 ENCOUNTER — Other Ambulatory Visit: Payer: Self-pay | Admitting: Emergency Medicine

## 2017-04-05 ENCOUNTER — Other Ambulatory Visit: Payer: Self-pay

## 2017-04-05 ENCOUNTER — Telehealth: Payer: Self-pay | Admitting: Family Medicine

## 2017-04-05 MED ORDER — GLUCOSE BLOOD VI STRP: ORAL_STRIP | 11 refills | Status: DC

## 2017-04-05 NOTE — Telephone Encounter (Signed)
Sent Rx for Test strips to pharmacy per patients request.

## 2017-04-05 NOTE — Telephone Encounter (Signed)
Refill sent to pharmacy as requested

## 2017-04-05 NOTE — Telephone Encounter (Signed)
Pt req script for diabetic testing strips called to [facility] on [address]. Pharmacy has tried to reach [facility] but pt told them he sees dr copland now. Please dr copland call in strips to [facility].

## 2017-04-05 NOTE — Progress Notes (Signed)
Faxed refill request received from [facility] for Contour Test Strips with Diagnosis code E11.9 Refill sent per [facility] refill protocol/SLS

## 2017-04-08 MED FILL — ATORVASTATIN 80 MG TABLET: 80 | 90 days supply | Qty: 45 | Fill #1

## 2017-04-10 ENCOUNTER — Other Ambulatory Visit: Payer: Self-pay | Admitting: Emergency Medicine

## 2017-04-10 MED ORDER — AMLODIPINE BESYLATE 10 MG PO TABS: 5.0000 mg | ORAL_TABLET | Freq: Every day | ORAL | 1 refills | Status: DC

## 2017-04-10 MED FILL — AMLODIPINE BESYLATE 10 MG T: 10 | 90 days supply | Qty: 45 | Fill #0

## 2017-04-11 ENCOUNTER — Ambulatory Visit (INDEPENDENT_AMBULATORY_CARE_PROVIDER_SITE_OTHER): Payer: Medicare Other | Admitting: Family Medicine

## 2017-04-11 VITALS — BP 142/74 | HR 76 | Temp 97.8°F | Ht 69.0 in | Wt 176.4 lb

## 2017-04-11 DIAGNOSIS — E119 Type 2 diabetes mellitus without complications: Secondary | ICD-10-CM

## 2017-04-11 DIAGNOSIS — Z5181 Encounter for therapeutic drug level monitoring: Secondary | ICD-10-CM | POA: Diagnosis not present

## 2017-04-11 DIAGNOSIS — H8112 Benign paroxysmal vertigo, left ear: Secondary | ICD-10-CM | POA: Diagnosis not present

## 2017-04-11 DIAGNOSIS — I10 Essential (primary) hypertension: Secondary | ICD-10-CM | POA: Diagnosis not present

## 2017-04-11 NOTE — Patient Instructions (Addendum)
I think that your dizziness is caused by BPPV- I will give you a hand- out with more info about this condition  Please go to the lab for a blood draw I have placed a PT referral for you- we will try to get you seen asap- please stop by the PT office on this floor after your blood draw   Let them know that you need treatment for vertigo/ BPPV

## 2017-04-11 NOTE — Progress Notes (Addendum)
[place] at [facility] [address] [place], [place] [address] [phone]  Date:  04/11/2017   Name:  Peter Scott   DOB:  [birth year]   MRN:  [id number]  PCP:  [name], MD    Chief Complaint: Dizziness (c/o of dizziness off and on x 3 weeks. )   History of Present Illness:  LUISANTONIO Scott is a 83 y.o. very pleasant male patient who presents with the following:  Here today to discuss dizziness History of DM, HTN, CAD I last saw Geordie in December for his CPE- partial HPI as follows  He had CABG in 2001, and an ablation for a fib about one month later.  His cardiologist is Dr. [name]. He has an Korea to monitor his AAA tomorrow- it has been stable for 10 years.   Last AAA Korea last December  Labs looked ok at last visit-  Lab Results  Component Value Date   HGBA1C 7.7 (H) 10/29/2016   He has noted a feeling of dizziness for about 3 weeks - he describes this as vertigo.  First noticed this one morning when he got out of bed and had a sudden feeling of the world moving around him  He also feels like his nose is congested but he is not having sinus pain He may have a mild HA off an on, but nothing like he "used to have"  He states that he had this sort of problem in the past when he had a sinus infection and wonders if this might be the same  If he rolls onto his left side in bed - to look at his alarm clock- he will predictably have the spinning sensation It sounds like he was dx with BPPV in the past.   He thinks that he may also be a bit lightheaded but it is mostly vertigo  Sx may last a few seconds to a minute or so- when he holds still they go away more quickly   No CP or SOB He has not tried any medications OTC except he did try some tylenol at bedtime a couple of times No fever or chills No diarrhea No belly pain No rash He did see ENT not long ago and they cleaned a lot of wax from his left ear- this  improved his hearing.  Otherwise he has not noted any hearing loss or tinnitus  No changes in his medications  BP Readings from Last 3 Encounters:  04/11/17 (!) 142/74  10/29/16 (!) 156/61  04/10/16 120/60      Patient Active Problem List   Diagnosis Date Noted  . Medicare annual wellness visit, subsequent 10/04/2014  . Aortic stenosis 09/29/2014  . Bruit 09/29/2014  . S/P gastric bypass 07/23/2014  . Abdominal aortic aneurysm ([place]) 07/23/2014  . BCC (basal cell carcinoma) 07/23/2014  . SCC (squamous cell carcinoma), arm 07/23/2014  . Essential hypertension, benign 07/08/2014  . Coronary artery disease 07/08/2014  . Hyperlipidemia LDL goal <100 07/08/2014  . Type II diabetes mellitus, well controlled ([place]) 07/08/2014  . Colon cancer screening 07/08/2014    Past Medical History:  Diagnosis Date  . CAD (coronary artery disease)   . Diabetes mellitus type II, controlled ([place])   . Heart disease    Ablation  . History of chicken pox   . Hyperlipidemia   . Hypertension   . Umbilical hernia     Past Surgical History:  Procedure Laterality Date  . ABLATION     Rhythm problem; rhythm unknown; [place] GRAFT  2001  . EXPLORATION POST OPERATIVE OPEN HEART  2001, June  . TONSILLECTOMY    . UMBILICAL HERNIA REPAIR  2015    Social History  Substance Use Topics  . Smoking status: Former Research scientist (life sciences)  . Smokeless tobacco: Never Used  . Alcohol use No    Family History  Problem Relation Age of Onset  . Diabetes Mother 44       Deceased  . Heart disease Father 44       Deceased  . Heart disease Maternal Uncle   . Diabetes Maternal Uncle   . Heart disease Brother   . Diabetes Brother   . Diabetes Sister        #1  . Heart disease Sister        #2  . Hyperlipidemia Son        x2    No Known Allergies  Medication list has been reviewed and updated.  Current Outpatient Prescriptions on File Prior to Visit  Medication Sig Dispense  Refill  . amLODipine (NORVASC) 10 MG tablet Take 0.5 tablets (5 mg total) by mouth daily. 45 tablet 1  . aspirin 81 MG tablet Take 81 mg by mouth daily.    Marland Kitchen atorvastatin (LIPITOR) 80 MG tablet Take 0.5 tablets (40 mg total) by mouth at bedtime. 45 tablet 1  . glucose blood (BAYER CONTOUR TEST) test strip TEST ONCE DAILY FOR DIABETES Dx Code: E11.9 100 each 11  . glyBURIDE (DIABETA) 5 MG tablet TAKE ONE-HALF TABLET BY MOUTH TWICE DAILY WITH MEALS 90 tablet 1  . losartan (COZAAR) 100 MG tablet TAKE 1/2 TABLET BY MOUTH 2 TIMES A DAY 90 tablet 1  . metFORMIN (GLUCOPHAGE) 500 MG tablet TAKE 2 TABLETS BY MOUTH EVERY MORNING THEN TAKE 1 TABLET EVERY EVENING 270 tablet 1  . spironolactone (ALDACTONE) 25 MG tablet Take 0.5 tablets (12.5 mg total) by mouth daily. 45 tablet 1   No current facility-administered medications on file prior to visit.     Review of Systems:  As per HPI- otherwise negative.   Physical Examination: Vitals:   04/11/17 1456 04/11/17 1502  BP: (!) 152/72 (!) 142/74  Pulse: 76   Temp: 97.8 F (36.6 C)    Vitals:   04/11/17 1456  Weight: 176 lb 6.4 oz (80 kg)  Height: 5\' 9"  (1.753 m)   Body mass index is 26.05 kg/m. Ideal Body Weight: Weight in (lb) to have BMI = 25: 168.9  GEN: WDWN, NAD, Non-toxic, A & O x 3, older man, looks well HEENT: Atraumatic, Normocephalic. Neck supple. No masses, No LAD.  Bilateral TM wnl, oropharynx normal.  PEERL,EOMI.   Ears and Nose: No external deformity. CV: RRR, No M/G/R. No JVD. No thrill. No extra heart sounds. PULM: CTA B, no wheezes, crackles, rhonchi. No retractions. No resp. distress. No accessory muscle use. ABD: S, NT, ND EXTR: No c/c/e NEURO Normal gait.  PSYCH: Normally interactive. Conversant. Not depressed or anxious appearing.  Calm demeanor.  Positive dix halpike to the left Otherwise normal neuro exam including strength, sensation and DTR of all extremities, negative romberg, normal facial movement   Assessment  and Plan: Benign paroxysmal positional vertigo of left ear - Plan: Ambulatory referral to Physical Therapy  Type II diabetes mellitus, well controlled ([place]) - Plan: Hemoglobin A1c  Essential hypertension, benign  Medication monitoring encounter - Plan:  CBC, Basic metabolic panel  Will obtain labs today as he is due for a CBC, a1c and BMP BP is acceptable Will set him up for re-positioning maneuvers with PT asap - suspect his sx are due to BPPV He will let me know if any change or worsening of his sx in the meantime    Signed Lamar Blinks, MD  Received his labs 5/27- message to pt.   Results for orders placed or performed in visit on 04/11/17  CBC  Result Value Ref Range   WBC 9.0 4.0 - 10.5 K/uL   RBC 4.25 4.22 - 5.81 Mil/uL   Platelets 182.0 150.0 - 400.0 K/uL   Hemoglobin 13.9 13.0 - 17.0 g/dL   HCT 41.4 39.0 - 52.0 %   MCV 97.5 78.0 - 100.0 fl   MCHC 33.5 30.0 - 36.0 g/dL   RDW 12.7 11.5 - 15.5 %  Hemoglobin A1c  Result Value Ref Range   Hgb A1c MFr Bld 8.1 (H) 4.6 - 6.5 %  Basic metabolic panel  Result Value Ref Range   Sodium 136 135 - 145 mEq/L   Potassium 4.3 3.5 - 5.1 mEq/L   Chloride 101 96 - 112 mEq/L   CO2 27 19 - 32 mEq/L   Glucose, Bld 238 (H) 70 - 99 mg/dL   BUN 22 6 - 23 mg/dL   Creatinine, Ser 1.31 0.40 - 1.50 mg/dL   Calcium 9.1 8.4 - 10.5 mg/dL   GFR 55.52 (L) >60.00 mL/min

## 2017-04-12 LAB — CBC
HCT: 41.4 % (ref 39.0–52.0)
Hemoglobin: 13.9 g/dL (ref 13.0–17.0)
MCHC: 33.5 g/dL (ref 30.0–36.0)
MCV: 97.5 fl (ref 78.0–100.0)
PLATELETS: 182 10*3/uL (ref 150.0–400.0)
RBC: 4.25 Mil/uL (ref 4.22–5.81)
RDW: 12.7 % (ref 11.5–15.5)
WBC: 9 10*3/uL (ref 4.0–10.5)

## 2017-04-12 LAB — BASIC METABOLIC PANEL
BUN: 22 mg/dL (ref 6–23)
CALCIUM: 9.1 mg/dL (ref 8.4–10.5)
CO2: 27 meq/L (ref 19–32)
Chloride: 101 mEq/L (ref 96–112)
Creatinine, Ser: 1.31 mg/dL (ref 0.40–1.50)
GFR: 55.52 mL/min — ABNORMAL LOW (ref >60.00)
GLUCOSE: 238 mg/dL — AB (ref 70–99)
POTASSIUM: 4.3 meq/L (ref 3.5–5.1)
SODIUM: 136 meq/L (ref 135–145)

## 2017-04-12 LAB — HEMOGLOBIN A1C: HEMOGLOBIN A1C: 8.1 % — AB (ref 4.6–6.5)

## 2017-04-14 ENCOUNTER — Encounter: Payer: Self-pay | Admitting: Family Medicine

## 2017-04-22 ENCOUNTER — Ambulatory Visit: Payer: Medicare Other | Attending: Family Medicine | Admitting: Physical Therapy

## 2017-04-22 ENCOUNTER — Other Ambulatory Visit: Payer: Self-pay | Admitting: Physician Assistant

## 2017-04-22 DIAGNOSIS — R42 Dizziness and giddiness: Secondary | ICD-10-CM | POA: Insufficient documentation

## 2017-04-22 MED FILL — metFORMIN HCL 500 MG TABS: 500 | 90 days supply | Qty: 270 | Fill #0

## 2017-04-22 NOTE — Therapy (Signed)
[facility] [address]  [place], [place], [address] Phone: [phone]   Fax:  [phone]  Physical Therapy Evaluation  Patient Details  Name: Peter Scott MRN: [id number] Date of Birth: [birth year] Referring Provider: Dr. Lamar Blinks  Encounter Date: 04/22/2017      PT End of Session - 04/22/17 1551    Visit Number 1   Number of Visits 6   Date for PT Re-Evaluation 06/03/17   Authorization Type Medicare   PT Start Time 1304   PT Stop Time 1333   PT Time Calculation (min) 29 min   Activity Tolerance Patient tolerated treatment well   Behavior During Therapy [facility] for tasks assessed/performed      Past Medical History:  Diagnosis Date  . CAD (coronary artery disease)   . Diabetes mellitus type II, controlled ([place])   . Heart disease    Ablation  . History of chicken pox   . Hyperlipidemia   . Hypertension   . Umbilical hernia     Past Surgical History:  Procedure Laterality Date  . ABLATION     Rhythm problem; rhythm unknown; [place] GRAFT  2001  . EXPLORATION POST OPERATIVE OPEN HEART  2001, June  . TONSILLECTOMY    . UMBILICAL HERNIA REPAIR  2015    There were no vitals filed for this visit.       Subjective Assessment - 04/22/17 1307    Subjective Patient reporting waking up and feeling like he was spinning. Felt like it was Vertigo - had difficulty lying on L side as well as getting up and walking. Has not been treated for vertigo before - feels like he has had this in the past with sinus issues. Now only has symptoms when lying on L side intermittently. Does not take medications for dizziness. No associated headaches, double vision   Pertinent History DM, CAD, HTN   Patient Stated Goals improve dizziness/function   Currently in Pain? No/denies   Multiple Pain Sites No            [facility] PT Assessment - 04/22/17 1312      Assessment   Medical  Diagnosis BPPV   Referring Provider Dr. Janett Billow Copland   Onset Date/Surgical Date --  2-3 weeks ago   Next MD Visit --  07/2017   Prior Therapy no     Precautions   Precautions None     Restrictions   Weight Bearing Restrictions No     Balance Screen   Has the patient fallen in the past 6 months No   Has the patient had a decrease in activity level because of a fear of falling?  No   Is the patient reluctant to leave their home because of a fear of falling?  No     Home Ecologist residence     Prior Function   Level of Independence Independent   Vocation Retired   Leisure hunting, [place]   Overall Cognitive Status Within Functional Limits for tasks assessed     Observation/Other Assessments   Focus on Therapeutic Outcomes (FOTO)  Vertigo: 56 (44% limited, predicted 21% limited)     Coordination   Gross Motor Movements are Fluid and Coordinated Yes     Posture/Postural Control   Posture/Postural Control Postural limitations   Postural Limitations Rounded Shoulders;Forward head;Increased thoracic kyphosis  Vestibular Assessment - 04/22/17 1314      Vestibular Assessment   General Observation no dizziness currently - primary complaint with rolling to L side     Symptom Behavior   Type of Dizziness Spinning   Frequency of Dizziness mainly when rolling in bed/couch   Duration of Dizziness 30-60 sec   Aggravating Factors Rolling to left   Relieving Factors Head stationary     Occulomotor Exam   Occulomotor Alignment Normal   Smooth Pursuits Intact   Saccades Intact     Vestibulo-Occular Reflex   VOR Cancellation Normal     Positional Testing   Dix-Hallpike Dix-Hallpike Right;Dix-Hallpike Left     Dix-Hallpike Right   Dix-Hallpike Right Duration neg   Dix-Hallpike Right Symptoms No nystagmus     Dix-Hallpike Left   Dix-Hallpike Left Duration 10 seconds   Dix-Hallpike Left Symptoms --  unclear  nystagmus, pt closed eyes, reported spinning     Cognition   Cognition Orientation Level Oriented x 4     Positional Sensitivities   Sit to Supine No dizziness   Right Hallpike No dizziness  Left Hallpike: 3 = moderate dizziness   Up from Right Hallpike No dizziness   Up from Left Hallpike Mild dizziness   Nose to Right Knee No dizziness   Right Knee to Sitting No dizziness   Nose to Left Knee No dizziness   Left Knee to Sitting No dizziness   Head Turning x 5 No dizziness   Head Nodding x 5 No dizziness        Objective measurements completed on examination: See above findings.           Vestibular Treatment/Exercise - 04/22/17 0001      Vestibular Treatment/Exercise   Vestibular Treatment Provided Canalith Repositioning   Canalith Repositioning Epley Manuever Left      EPLEY MANUEVER LEFT   Number of Reps  2   Overall Response  Improved Symptoms    RESPONSE DETAILS LEFT improved Dix-Hallpike symptoms, little to no symptoms with rolling to R side during maneuver on 2nd rep               PT Education - 04/22/17 1550    Education provided Yes   Education Details exam findings, POC, HEP, Vertigo (BPPV)   Person(s) Educated Patient   Methods Explanation;Demonstration;Handout   Comprehension Verbalized understanding;Returned demonstration             PT Long Term Goals - 04/22/17 1559      PT LONG TERM GOAL #1   Title patient to be independent with HEP for vestibular (06/03/17)   Status New     PT LONG TERM GOAL #2   Title patient to demonstrate negative L Dix-Hallpike for greater than 2 weeks (06/03/17)   Status New     PT LONG TERM GOAL #3   Title Patient to report ability to roll in bed to L as well as look up/overhead without dizziness (06/03/17)   Status New     PT LONG TERM GOAL #4   Title Patient to report dizziness no greater than 1-2/10 for greater than 2 weeks (06/03/17)   Status New                Plan - 04/22/17 1552     Clinical Impression Statement Patient is a 83 y/o male presenting to [facility] today for low complexity eval regarding primary complaint of dizziness with rolling to L side as well as intermittent looking  up and to the R. Patient today with normal eye movements during saccades and smooth pursuits with no evidence of nystagmus or lagging/over shooting. Patient then taken through R Dix-Hallpike with no symptoms, then L Dix-Hallpike with immediate onset of "spinning", however due to eye closure PT unable to assess for nystagmus before resolution. Patient then taken through L Epley maneuver with improved symptoms at end of session. PT to follow up with patient in 1 week to re-assess and evaluate need for continued PT at that point based on symptoms.    History and Personal Factors relevant to plan of care: DM, CAD, HTN   Clinical Presentation Stable   Clinical Decision Making Low   Rehab Potential Excellent   PT Frequency 1x / week   PT Duration 6 weeks   PT Treatment/Interventions ADLs/Self Care Home Management;Canalith Repostioning;Neuromuscular re-education;Balance training;Therapeutic exercise;Therapeutic activities;Functional mobility training;Patient/family education;Manual techniques;Passive range of motion;Vestibular;Taping;Dry needling   Consulted and Agree with Plan of Care Patient      Patient will benefit from skilled therapeutic intervention in order to improve the following deficits and impairments:  Dizziness, Decreased balance, Postural dysfunction  Visit Diagnosis: Dizziness and giddiness - Plan: PT plan of care cert/re-cert      G-Codes - 08/11/29 1600    Functional Assessment Tool Used (Outpatient Only) FOTO: 56 (44% limited)    Functional Limitation Mobility: Walking and moving around   Mobility: Walking and Moving Around Current Status (Q7622) At least 40 percent but less than 60 percent impaired, limited or restricted   Mobility: Walking and Moving Around Goal Status (Q3335) At  least 20 percent but less than 40 percent impaired, limited or restricted       Problem List Patient Active Problem List   Diagnosis Date Noted  . Medicare annual wellness visit, subsequent 10/04/2014  . Aortic stenosis 09/29/2014  . Bruit 09/29/2014  . S/P gastric bypass 07/23/2014  . Abdominal aortic aneurysm ([place]) 07/23/2014  . BCC (basal cell carcinoma) 07/23/2014  . SCC (squamous cell carcinoma), arm 07/23/2014  . Essential hypertension, benign 07/08/2014  . Coronary artery disease 07/08/2014  . Hyperlipidemia LDL goal <100 07/08/2014  . Type II diabetes mellitus, well controlled ([place]) 07/08/2014  . Colon cancer screening 07/08/2014     Lanney Gins, PT, DPT 04/22/17 4:02 PM   [facility] [address]  [place], [place], [address] Phone: [phone]   Fax:  [phone]  Name: Peter Scott MRN: [id number] Date of Birth: Nov 08, 1932

## 2017-04-22 NOTE — Patient Instructions (Signed)
Oculomotor: Smooth Pursuits    Holding a target, keep eyes on target and slowly move target side to side with head still. Perform sitting. Repeat __3-5__ times for 30 seconds per session. Do ____ sessions per day. Repeat using target on pattern background.

## 2017-04-29 ENCOUNTER — Ambulatory Visit: Payer: Medicare Other | Admitting: Family Medicine

## 2017-04-29 ENCOUNTER — Ambulatory Visit: Payer: Medicare Other | Admitting: Physical Therapy

## 2017-04-29 DIAGNOSIS — R42 Dizziness and giddiness: Secondary | ICD-10-CM | POA: Diagnosis not present

## 2017-04-29 NOTE — Therapy (Signed)
[facility] [address]  [place], [place], [address] Phone: [phone]   Fax:  [phone]  Physical Therapy Treatment  Patient Details  Name: Peter Scott MRN: [id number] Date of Birth: 11-13-1932 Referring Provider: Dr. Lamar Blinks  Encounter Date: 04/29/2017      PT End of Session - 04/29/17 1343    Visit Number 2   Number of Visits 6   Date for PT Re-Evaluation 06/03/17   Authorization Type Medicare   PT Start Time 1311   PT Stop Time 1334   PT Time Calculation (min) 23 min   Activity Tolerance Patient tolerated treatment well   Behavior During Therapy [facility] for tasks assessed/performed      Past Medical History:  Diagnosis Date  . CAD (coronary artery disease)   . Diabetes mellitus type II, controlled ([place])   . Heart disease    Ablation  . History of chicken pox   . Hyperlipidemia   . Hypertension   . Umbilical hernia     Past Surgical History:  Procedure Laterality Date  . ABLATION     Rhythm problem; rhythm unknown; [place] GRAFT  2001  . EXPLORATION POST OPERATIVE OPEN HEART  2001, June  . TONSILLECTOMY    . UMBILICAL HERNIA REPAIR  2015    There were no vitals filed for this visit.      Subjective Assessment - 04/29/17 1312    Subjective Has been feeling well up until today. Feels a little lightheaded now, doesn't know if its correlated to the weather/barometric pressure.   Pertinent History DM, CAD, HTN   Patient Stated Goals improve dizziness/function   Currently in Pain? No/denies   Multiple Pain Sites No                Vestibular Assessment - 04/29/17 0001      Vestibular Assessment   General Observation patient reporitng feeling lightheaded     Symptom Behavior   Type of Dizziness Lightheadedness   Frequency of Dizziness patient noting spinning with rolling to L side   Duration of Dizziness few seconds   Aggravating Factors  Rolling to left     Positional Testing   Dix-Hallpike Dix-Hallpike Right;Dix-Hallpike Left     Dix-Hallpike Right   Dix-Hallpike Right Duration neg   Dix-Hallpike Right Symptoms No nystagmus     Dix-Hallpike Left   Dix-Hallpike Left Duration 8-10 seconds   Dix-Hallpike Left Symptoms --  rotary nystagmus     Cognition   Cognition Orientation Level Oriented x 4                  Vestibular Treatment/Exercise - 04/29/17 0001      Vestibular Treatment/Exercise   Vestibular Treatment Provided Canalith Repositioning   Canalith Repositioning Epley Manuever Left      EPLEY MANUEVER LEFT   Number of Reps  2   Overall Response  Improved Symptoms    RESPONSE DETAILS LEFT no rotary nystagmus on second test; however, some symptoms remained, therefore taken through canalith repositioning second time to ensure resolution of symptoms.                     PT Long Term Goals - 04/29/17 1345      PT LONG TERM GOAL #1   Title patient to be independent with HEP for vestibular (06/03/17)   Status On-going     PT LONG TERM GOAL #  2   Title patient to demonstrate negative L Dix-Hallpike for greater than 2 weeks (06/03/17)   Status On-going     PT LONG TERM GOAL #3   Title Patient to report ability to roll in bed to L as well as look up/overhead without dizziness (06/03/17)   Status On-going     PT LONG TERM GOAL #4   Title Patient to report dizziness no greater than 1-2/10 for greater than 2 weeks (06/03/17)               Plan - 04/29/17 1345    Clinical Impression Statement Peter Scott today, reporting he has been feeling better with only symtpoms during rolling to L side while on couch or bed. Reports some lightheadedness today for unknown reason. Patient again taken through L Dix-Hallpike with positive signs and symtpoms, therefore taken through Canalith Repositioning with resoultion of symptoms. L Dix-Hallpike reassessed with no nystagmus noted. PT requesting patient  follow-up again in 1 week to ensure complete resolution.    PT Treatment/Interventions ADLs/Self Care Home Management;Canalith Repostioning;Neuromuscular re-education;Balance training;Therapeutic exercise;Therapeutic activities;Functional mobility training;Patient/family education;Manual techniques;Passive range of motion;Vestibular;Taping;Dry needling   PT Next Visit Plan reassess L Dix-Hallpike; hopeful d/c   Consulted and Agree with Plan of Care Patient      Patient will benefit from skilled therapeutic intervention in order to improve the following deficits and impairments:  Dizziness, Decreased balance, Postural dysfunction  Visit Diagnosis: Dizziness and giddiness     Problem List Patient Active Problem List   Diagnosis Date Noted  . Medicare annual wellness visit, subsequent 10/04/2014  . Aortic stenosis 09/29/2014  . Bruit 09/29/2014  . S/P gastric bypass 07/23/2014  . Abdominal aortic aneurysm ([place]) 07/23/2014  . BCC (basal cell carcinoma) 07/23/2014  . SCC (squamous cell carcinoma), arm 07/23/2014  . Essential hypertension, benign 07/08/2014  . Coronary artery disease 07/08/2014  . Hyperlipidemia LDL goal <100 07/08/2014  . Type II diabetes mellitus, well controlled ([place]) 07/08/2014  . Colon cancer screening 07/08/2014    Lanney Gins, PT, DPT 04/29/17 1:55 PM    [facility] [address]  [place], [place], [address] Phone: [phone]   Fax:  [phone]  Name: Peter Scott MRN: [id number] Date of Birth: 06/29/1932

## 2017-05-06 ENCOUNTER — Ambulatory Visit: Payer: Medicare Other | Admitting: Physical Therapy

## 2017-05-06 DIAGNOSIS — R42 Dizziness and giddiness: Secondary | ICD-10-CM

## 2017-05-06 NOTE — Patient Instructions (Signed)

## 2017-05-06 NOTE — Therapy (Addendum)
[facility] [address]  [place], [place], [address] Phone: [phone]   Fax:  [phone]  Physical Therapy Treatment  Patient Details  Name: HOLSTON OYAMA MRN: [id number] Date of Birth: 05/27/1932 Referring Provider: Dr. Lamar Blinks  Encounter Date: 05/06/2017      PT End of Session - 05/06/17 1309    Visit Number 2   Number of Visits 6   Date for PT Re-Evaluation 06/03/17   Authorization Type Medicare   PT Start Time 1307   PT Stop Time 1324   PT Time Calculation (min) 17 min   Activity Tolerance Patient tolerated treatment well   Behavior During Therapy [facility] for tasks assessed/performed      Past Medical History:  Diagnosis Date  . CAD (coronary artery disease)   . Diabetes mellitus type II, controlled ([place])   . Heart disease    Ablation  . History of chicken pox   . Hyperlipidemia   . Hypertension   . Umbilical hernia     Past Surgical History:  Procedure Laterality Date  . ABLATION     Rhythm problem; rhythm unknown; [place] GRAFT  2001  . EXPLORATION POST OPERATIVE OPEN HEART  2001, June  . TONSILLECTOMY    . UMBILICAL HERNIA REPAIR  2015    There were no vitals filed for this visit.      Subjective Assessment - 05/06/17 1309    Subjective Only 1-2 instances of dizziness. Quickly resolves.    Pertinent History DM, CAD, HTN   Patient Stated Goals improve dizziness/function   Currently in Pain? No/denies   Multiple Pain Sites No                Vestibular Assessment - 05/06/17 0001      Vestibular Assessment   General Observation no dizziness reported today     Positional Testing   Dix-Hallpike Dix-Hallpike Right;Dix-Hallpike Left     Dix-Hallpike Right   Dix-Hallpike Right Duration neg   Dix-Hallpike Right Symptoms No nystagmus     Dix-Hallpike Left   Dix-Hallpike Left Duration neg   Dix-Hallpike Left Symptoms No nystagmus      Cognition   Cognition Orientation Level Oriented x 4     Positional Sensitivities   Sit to Supine No dizziness   Supine to Sitting No dizziness   Right Hallpike No dizziness   Up from Right Hallpike No dizziness   Up from Left Hallpike No dizziness   Nose to Right Knee No dizziness   Right Knee to Sitting No dizziness   Nose to Left Knee No dizziness   Left Knee to Sitting No dizziness   Head Turning x 5 No dizziness   Head Nodding x 5 No dizziness                              PT Long Term Goals - 05/06/17 1310      PT LONG TERM GOAL #1   Title patient to be independent with HEP for vestibular (06/03/17)   Status Achieved     PT LONG TERM GOAL #2   Title patient to demonstrate negative L Dix-Hallpike for greater than 2 weeks (06/03/17)   Status Achieved     PT LONG TERM GOAL #3   Title Patient to report ability to roll in bed to L as well as look up/overhead without dizziness (  06/03/17)   Status Achieved     PT LONG TERM GOAL #4   Title Patient to report dizziness no greater than 1-2/10 for greater than 2 weeks (06/03/17)   Status Achieved               Plan - 05-30-2017 1311    Clinical Impression Statement Patient reporting near resolution of symptoms today. Both R and L Dix-Hallpike testing done today with negative findings as well as all motion provoking positions. Did give patient education on how to perform self-epley maneuver as well as education to return to PT for any other needs with good carryover. Patient meeting all established goals today and welcome to return for any other needs.    PT Treatment/Interventions ADLs/Self Care Home Management;Canalith Repostioning;Neuromuscular re-education;Balance training;Therapeutic exercise;Therapeutic activities;Functional mobility training;Patient/family education;Manual techniques;Passive range of motion;Vestibular;Taping;Dry needling   PT Next Visit Plan 30 day hold at this time   Consulted and  Agree with Plan of Care Patient      Patient will benefit from skilled therapeutic intervention in order to improve the following deficits and impairments:  Dizziness, Decreased balance, Postural dysfunction  Visit Diagnosis: Dizziness and giddiness       G-Codes - 2017-05-30 1400    Functional Assessment Tool Used (Outpatient Only) FOTO: 97 (3% limited)   Functional Limitation Mobility: Walking and moving around   Mobility: Walking and Moving Around Goal Status [phone]) At least 20 percent but less than 40 percent impaired, limited or restricted   Mobility: Walking and Moving Around Discharge Status [phone]) At least 1 percent but less than 20 percent impaired, limited or restricted      Problem List Patient Active Problem List   Diagnosis Date Noted  . Medicare annual wellness visit, subsequent 10/04/2014  . Aortic stenosis 09/29/2014  . Bruit 09/29/2014  . S/P gastric bypass 07/23/2014  . Abdominal aortic aneurysm ([place]) 07/23/2014  . BCC (basal cell carcinoma) 07/23/2014  . SCC (squamous cell carcinoma), arm 07/23/2014  . Essential hypertension, benign 07/08/2014  . Coronary artery disease 07/08/2014  . Hyperlipidemia LDL goal <100 07/08/2014  . Type II diabetes mellitus, well controlled ([place]) 07/08/2014  . Colon cancer screening 07/08/2014     Lanney Gins, PT, DPT 2017-05-30 2:01 PM   PHYSICAL THERAPY DISCHARGE SUMMARY  Visits from Start of Care: 3  Current functional level related to goals / functional outcomes: See above   Remaining deficits: See above   Education / Equipment: HEP  Plan: Patient agrees to discharge.  Patient goals were met. Patient is being discharged due to meeting the stated rehab goals.  ?????     Lanney Gins, PT, DPT 06/12/17 8:24 AM   [facility] [address]  [place], [place], [address] Phone: [phone]   Fax:  [phone]  Name: MICHELLE VANHISE MRN:  [id number] Date of Birth: June 16, 1932

## 2017-05-15 ENCOUNTER — Other Ambulatory Visit: Payer: Self-pay | Admitting: Emergency Medicine

## 2017-05-15 MED ORDER — SPIRONOLACTONE 25 MG PO TABS: 12.5000 mg | ORAL_TABLET | Freq: Every day | ORAL | 1 refills | Status: DC

## 2017-05-15 MED FILL — SPIRONOLACTONE 25 MG TABLET: 25 | 90 days supply | Qty: 45 | Fill #0

## 2017-06-10 ENCOUNTER — Other Ambulatory Visit: Payer: Self-pay

## 2017-06-19 ENCOUNTER — Ambulatory Visit: Payer: Medicare Other | Admitting: Family Medicine

## 2017-06-25 DIAGNOSIS — L570 Actinic keratosis: Secondary | ICD-10-CM | POA: Diagnosis not present

## 2017-06-25 DIAGNOSIS — Z08 Encounter for follow-up examination after completed treatment for malignant neoplasm: Secondary | ICD-10-CM | POA: Diagnosis not present

## 2017-06-25 DIAGNOSIS — Z85828 Personal history of other malignant neoplasm of skin: Secondary | ICD-10-CM | POA: Diagnosis not present

## 2017-07-01 ENCOUNTER — Other Ambulatory Visit: Payer: Self-pay | Admitting: Physician Assistant

## 2017-07-02 ENCOUNTER — Other Ambulatory Visit: Payer: Self-pay | Admitting: Physician Assistant

## 2017-07-02 MED FILL — ATORVASTATIN 80 MG TABLET: 80 | 90 days supply | Qty: 45 | Fill #0

## 2017-07-02 MED FILL — LOSARTAN POTASSIUM 100 MG T: 100 | 90 days supply | Qty: 90 | Fill #0

## 2017-07-02 NOTE — Telephone Encounter (Signed)
Will defer further refills of patient's medications to PCP  

## 2017-07-15 MED FILL — AMLODIPINE BESYLATE 10 MG T: 10 | 90 days supply | Qty: 45 | Fill #1

## 2017-07-25 MED FILL — metFORMIN HCL 500 MG TABS: 500 | 90 days supply | Qty: 270 | Fill #1

## 2017-08-21 MED FILL — SPIRONOLACTONE 25 MG TABLET: 25 | 90 days supply | Qty: 45 | Fill #1

## 2017-09-02 ENCOUNTER — Ambulatory Visit (INDEPENDENT_AMBULATORY_CARE_PROVIDER_SITE_OTHER): Payer: Medicare Other | Admitting: Behavioral Health

## 2017-09-02 DIAGNOSIS — Z23 Encounter for immunization: Secondary | ICD-10-CM

## 2017-09-02 NOTE — Progress Notes (Signed)
Pre visit review using our clinic review tool, if applicable. No additional management support is needed unless otherwise documented below in the visit note.  Patient came in clinic today for influenza vaccination. IM injection was given in the left deltoid. Patient tolerated the injection well. No s/s of a reaction before leaving the nurse visit.

## 2017-09-04 ENCOUNTER — Encounter: Payer: Self-pay | Admitting: Family Medicine

## 2017-09-13 ENCOUNTER — Encounter: Payer: Self-pay | Admitting: Family Medicine

## 2017-09-13 DIAGNOSIS — E785 Hyperlipidemia, unspecified: Secondary | ICD-10-CM

## 2017-09-13 DIAGNOSIS — E119 Type 2 diabetes mellitus without complications: Secondary | ICD-10-CM

## 2017-09-13 DIAGNOSIS — Z130 Encounter for screening for diseases of the blood and blood-forming organs and certain disorders involving the immune mechanism: Secondary | ICD-10-CM

## 2017-09-13 DIAGNOSIS — E1169 Type 2 diabetes mellitus with other specified complication: Secondary | ICD-10-CM

## 2017-09-20 NOTE — Progress Notes (Signed)
[place] at [facility] [address] [place], [place] [address] [phone]  Date:  09/23/2017   Name:  Peter Scott   DOB:  11/06/1932   MRN:  [id number]  PCP:  [name], MD    Chief Complaint: Diabetes (Pt here for follow up )   History of Present Illness:  Peter Scott is a 83 y.o. very pleasant male patient who presents with the following:  Follow-up visit today- history of skin cancer, hyperlipidemia, DM, HTN, CAD Last seen here in May with BPPV- he did PT for this.  This helped him a lot, and he learned out to do Epley maneuvers if he ever needs them again in the future   DM has been uncontrolled recently- see most recent A1c below  Lab Results  Component Value Date   HGBA1C 8.1 (H) 04/11/2017    He is taking glyburide BID, metformin Arlyce Harman, norvasc, losartan  Her BP has run 962- 836 systolic at home  He notes that he does not have a lot of energy, although he does overall feel pretty good His son as 180 acres- he does ok as long as she is on the tractor.  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  However he would prefer to have labs when he is fasting another day  He had a recnet eye exam which was ok  He does have known aortic stenosis last echo about 2 years ago  Patient Active Problem List   Diagnosis Date Noted  . Medicare annual wellness visit, subsequent 10/04/2014  . Aortic stenosis 09/29/2014  . Bruit 09/29/2014  . S/P gastric bypass 07/23/2014  . Abdominal aortic aneurysm ([place]) 07/23/2014  . BCC (basal cell carcinoma) 07/23/2014  . SCC (squamous cell carcinoma), arm 07/23/2014  . Essential hypertension, benign 07/08/2014  . Coronary artery disease 07/08/2014  . Hyperlipidemia LDL goal <100 07/08/2014  . Type II diabetes mellitus, well  controlled ([place]) 07/08/2014  . Colon cancer screening 07/08/2014    Past Medical History:  Diagnosis Date  . CAD (coronary artery disease)   . Diabetes mellitus type II, controlled ([place])   . Heart disease    Ablation  . History of chicken pox   . Hyperlipidemia   . Hypertension   . Umbilical hernia     Past Surgical History:  Procedure Laterality Date  . ABLATION     Rhythm problem; rhythm unknown; [place] GRAFT  2001  . EXPLORATION POST OPERATIVE OPEN HEART  2001, June  . TONSILLECTOMY    . UMBILICAL HERNIA REPAIR  2015    Social History   Tobacco Use  . Smoking status: Former Research scientist (life sciences)  . Smokeless tobacco: Never Used  Substance Use Topics  . Alcohol use: No    Alcohol/week: 0.0 oz  . Drug use: No    Family History  Problem Relation Age of Onset  . Diabetes Mother 52       Deceased  . Heart disease Father 65       Deceased  . Heart disease Maternal Uncle   . Diabetes Maternal Uncle   . Heart disease Brother   . Diabetes Brother   . Diabetes Sister        #  1  . Heart disease Sister        #2  . Hyperlipidemia Son        x2    No Known Allergies  Medication list has been reviewed and updated.  Current Outpatient Medications on File Prior to Visit  Medication Sig Dispense Refill  . amLODipine (NORVASC) 10 MG tablet Take 0.5 tablets (5 mg total) by mouth daily. 45 tablet 1  . aspirin 81 MG tablet Take 81 mg by mouth daily.    Marland Kitchen atorvastatin (LIPITOR) 80 MG tablet Take 0.5 tablets (40 mg total) by mouth at bedtime. 45 tablet 1  . glucose blood (BAYER CONTOUR TEST) test strip TEST ONCE DAILY FOR DIABETES Dx Code: E11.9 100 each 11  . glyBURIDE (DIABETA) 5 MG tablet TAKE ONE-HALF TABLET BY MOUTH TWICE DAILY WITH MEALS 90 tablet 1  . losartan (COZAAR) 100 MG tablet TAKE 1/2 TABLET BY MOUTH 2 TIMES A DAY 90 tablet 3  . metFORMIN (GLUCOPHAGE) 500 MG tablet TAKE 2 TABLETS BY MOUTH EVERY MORNING THEN TAKE 1 TABLET EVERY EVENING  270 tablet 1  . spironolactone (ALDACTONE) 25 MG tablet Take 0.5 tablets (12.5 mg total) by mouth daily. 45 tablet 1   No current facility-administered medications on file prior to visit.     Review of Systems:  As per HPI- otherwise negative.   Physical Examination: Vitals:   09/23/17 1013  BP: 140/70  Pulse: 63  Resp: 16  Temp: 98 F (36.7 C)  SpO2: 98%   Vitals:   09/23/17 1013  Weight: 177 lb (80.3 kg)   Body mass index is 26.14 kg/m. Ideal Body Weight:    GEN: WDWN, NAD, Non-toxic, A & O x 3, overweight, looks well HEENT: Atraumatic, Normocephalic. Neck supple. No masses, No LAD.  Bilateral TM wnl, oropharynx normal.  PEERL,EOMI.   Ears and Nose: No external deformity. CV: RRR, No M/G/R. No JVD. No thrill. No extra heart sounds. PULM: CTA B, no wheezes, crackles, rhonchi. No retractions. No resp. distress. No accessory muscle use. ABD: S, NT, ND, +BS. No rebound. No HSM. EXTR: No c/c/e NEURO Normal gait.  PSYCH: Normally interactive. Conversant. Not depressed or anxious appearing.  Calm demeanor.     Assessment and Plan: Controlled type 2 diabetes mellitus without complication, without long-term current use of insulin ([place]) - Plan: Hemoglobin A1c, Comprehensive metabolic panel  Abdominal aortic aneurysm (AAA) 3.0 cm to 5.5 cm in diameter in male [facility]) - Plan: US Abdomen Limited  Aortic valve disorder - Plan: ECHOCARDIOGRAM COMPLETE  Screening for deficiency anemia - Plan: CBC  Need for shingles vaccine  Mixed hyperlipidemia - Plan: Lipid panel  Need to follow-up on his AAA today Also will repeat his echo to follow-up on his aortic stenosis Labs pending as above rx for shingrix Check A1c today  Will plan further follow- up pending labs.    Signed Lamar Blinks, MD

## 2017-09-23 ENCOUNTER — Ambulatory Visit (INDEPENDENT_AMBULATORY_CARE_PROVIDER_SITE_OTHER): Payer: Medicare Other | Admitting: Family Medicine

## 2017-09-23 ENCOUNTER — Encounter: Payer: Self-pay | Admitting: Family Medicine

## 2017-09-23 VITALS — BP 140/70 | HR 63 | Temp 98.0°F | Resp 16 | Wt 177.0 lb

## 2017-09-23 DIAGNOSIS — Z130 Encounter for screening for diseases of the blood and blood-forming organs and certain disorders involving the immune mechanism: Secondary | ICD-10-CM | POA: Diagnosis not present

## 2017-09-23 DIAGNOSIS — E782 Mixed hyperlipidemia: Secondary | ICD-10-CM | POA: Diagnosis not present

## 2017-09-23 DIAGNOSIS — I714 Abdominal aortic aneurysm, without rupture, unspecified: Secondary | ICD-10-CM

## 2017-09-23 DIAGNOSIS — Z23 Encounter for immunization: Secondary | ICD-10-CM

## 2017-09-23 DIAGNOSIS — I359 Nonrheumatic aortic valve disorder, unspecified: Secondary | ICD-10-CM | POA: Diagnosis not present

## 2017-09-23 DIAGNOSIS — E119 Type 2 diabetes mellitus without complications: Secondary | ICD-10-CM

## 2017-09-23 LAB — CBC
HCT: 42.5 % (ref 39.0–52.0)
HEMOGLOBIN: 14.3 g/dL (ref 13.0–17.0)
MCHC: 33.6 g/dL (ref 30.0–36.0)
MCV: 99.2 fl (ref 78.0–100.0)
Platelets: 182 10*3/uL (ref 150.0–400.0)
RBC: 4.29 Mil/uL (ref 4.22–5.81)
RDW: 12.6 % (ref 11.5–15.5)
WBC: 8.9 10*3/uL (ref 4.0–10.5)

## 2017-09-23 LAB — LIPID PANEL
CHOL/HDL RATIO: 4
Cholesterol: 123 mg/dL (ref 0–200)
HDL: 33.1 mg/dL — AB (ref >39.00)
LDL Cholesterol: 56 mg/dL (ref 0–99)
NONHDL: 89.94
Triglycerides: 171 mg/dL — ABNORMAL HIGH (ref 0.0–149.0)
VLDL: 34.2 mg/dL (ref 0.0–40.0)

## 2017-09-23 LAB — COMPREHENSIVE METABOLIC PANEL
ALT: 19 U/L (ref 0–53)
AST: 16 U/L (ref 0–37)
Albumin: 3.8 g/dL (ref 3.5–5.2)
Alkaline Phosphatase: 57 U/L (ref 39–117)
BILIRUBIN TOTAL: 0.4 mg/dL (ref 0.2–1.2)
BUN: 23 mg/dL (ref 6–23)
CO2: 28 mEq/L (ref 19–32)
CREATININE: 1.22 mg/dL (ref 0.40–1.50)
Calcium: 9.7 mg/dL (ref 8.4–10.5)
Chloride: 102 mEq/L (ref 96–112)
GFR: 60.21 mL/min (ref >60.00)
GLUCOSE: 119 mg/dL — AB (ref 70–99)
Potassium: 4.5 mEq/L (ref 3.5–5.1)
SODIUM: 137 meq/L (ref 135–145)
Total Protein: 6.7 g/dL (ref 6.0–8.3)

## 2017-09-23 LAB — HEMOGLOBIN A1C: HEMOGLOBIN A1C: 8.1 % — AB (ref 4.6–6.5)

## 2017-09-23 NOTE — Patient Instructions (Signed)
It was nice to see you today- we will check on your A1c and other labs today I will set you up for an echocardiogram and also an aortic ultrasound Please get the Shingles vaccine at your convenience I will be in touch with your labs asap If any other issues with your balance appear please let me know

## 2017-09-25 MED FILL — LOSARTAN POTASSIUM 100 MG T: 100 | 90 days supply | Qty: 90 | Fill #1

## 2017-09-30 ENCOUNTER — Other Ambulatory Visit: Payer: Self-pay | Admitting: Family Medicine

## 2017-10-03 MED FILL — ATORVASTATIN 80 MG TABLET: 80 | 90 days supply | Qty: 45 | Fill #1

## 2017-10-09 ENCOUNTER — Ambulatory Visit (HOSPITAL_BASED_OUTPATIENT_CLINIC_OR_DEPARTMENT_OTHER)
Admission: RE | Admit: 2017-10-09 | Discharge: 2017-10-09 | Disposition: A | Discharge status: Home / Self Care | Payer: Medicare Other | Source: Ambulatory Visit | Attending: Family Medicine | Admitting: Family Medicine

## 2017-10-09 DIAGNOSIS — I10 Essential (primary) hypertension: Secondary | ICD-10-CM | POA: Diagnosis not present

## 2017-10-09 DIAGNOSIS — I359 Nonrheumatic aortic valve disorder, unspecified: Secondary | ICD-10-CM | POA: Diagnosis not present

## 2017-10-09 DIAGNOSIS — I2510 Atherosclerotic heart disease of native coronary artery without angina pectoris: Secondary | ICD-10-CM | POA: Insufficient documentation

## 2017-10-09 DIAGNOSIS — E119 Type 2 diabetes mellitus without complications: Secondary | ICD-10-CM | POA: Diagnosis not present

## 2017-10-09 DIAGNOSIS — I083 Combined rheumatic disorders of mitral, aortic and tricuspid valves: Secondary | ICD-10-CM | POA: Diagnosis not present

## 2017-10-09 NOTE — Progress Notes (Signed)
  Echocardiogram 2D Echocardiogram has been performed.  Peter Scott 10/09/2017, 3:01 PM

## 2017-10-12 ENCOUNTER — Encounter: Payer: Self-pay | Admitting: Family Medicine

## 2017-10-12 ENCOUNTER — Other Ambulatory Visit: Payer: Self-pay | Admitting: Family Medicine

## 2017-10-12 DIAGNOSIS — I350 Nonrheumatic aortic (valve) stenosis: Secondary | ICD-10-CM

## 2017-10-13 ENCOUNTER — Encounter: Payer: Self-pay | Admitting: Family Medicine

## 2017-10-14 ENCOUNTER — Ambulatory Visit: Payer: Medicare Other | Admitting: Family Medicine

## 2017-10-16 ENCOUNTER — Encounter: Payer: Self-pay | Admitting: Family Medicine

## 2017-10-17 ENCOUNTER — Other Ambulatory Visit: Payer: Self-pay | Admitting: Family Medicine

## 2017-10-17 MED FILL — metFORMIN HCL 500 MG TABS: 500 | 90 days supply | Qty: 270 | Fill #0

## 2017-10-17 MED FILL — AMLODIPINE BESYLATE 10 MG T: 10 | 90 days supply | Qty: 45 | Fill #0

## 2017-10-22 NOTE — Progress Notes (Addendum)
[place] at [facility] [address], [place], [place] [address] [phone]  Date:  10/23/2017   Name:  Peter Scott   DOB:  16-May-1932   MRN:  [id number]  PCP:  [name], MD    Chief Complaint: Dizziness (Pt states that if he moves too fast he feels light headed and dizzy. States also occurs when he looks down or ties his shoes. ) and Pressure Behind the Eyes (Pt states there's so much pressure behind his eyes it's sometimes painful and his eyes burn )   History of Present Illness:  Peter Scott is a 83 y.o. very pleasant male patient who presents with the following:  Here today with concern of dizziness I last saw him about a month ago-  Follow-up visit today- history of skin cancer, hyperlipidemia, DM, HTN, CAD Last seen here in May with BPPV- he did PT for this.  This helped him a lot, and he learned out to do Epley maneuvers if he ever needs them again in the future   DM has been uncontrolled recently- see most recent A1c below  RecentLabs       Lab Results  Component Value Date   HGBA1C 8.1 (H) 04/11/2017      He is taking glyburide BID, metformin Arlyce Harman, norvasc, losartan Her BP has run 825- 053 systolic at home  He notes that he does not have a lot of energy, although he does overall feel pretty good His son as 180 acres- he does ok as long as she is on the tractor.  However, when he rakes or digs, he will feel tried very easily He does have a AAA which we are monitoring - due for [place] next month He does have known aortic stenosis last echo about 2 years ago   His echo did show likely worsening of his aortic stenosis.  He is a pt of Dr. [name] and we planned to have him follow-up- ? Does he have an appt- he has not yet heard from them.  I will call  He then sent me a message about current sx:  I feel something in my head needs to be checked out. I seem to have a slight pressure behind my eyes, my  head seems full (if I can describe it as so), I am unsteady on my feed (when I stand and look up), when I am seated and stand I will need to remain still until I feel safe to walk. I don't know if it is something to do with sinus or not. I can take a Tylenol and in less than two hours things are better (90%) but by the next morning it is the same all over. Do you have any recommendations? An ENT or a Neurologist? Thanks.   Pt notes that when he had the vertigo, he had a sensation of spinning.  This is a different problem- he notes a feeling of fullness and pressure from the cheeks up through his forehead on both sides He also notes that when he gets up from seated he will feel lightheaded He is not having headaches He describes a pressure behind his eyes, and a feeling of his eyes burning He saw his eye doctor at the end of October and all appeared to be well  He notes that as long as he is sitting still he is ok He is having a tendency to fall toward the right  when he is walking.   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    No CP or SOB  No fever He did have a nosebleed a few days ago- he was able to treat this at home by putting a tea bag in his nose.   He has never had quite this problem in the past   BP Readings from Last 3 Encounters:  10/23/17 130/70  09/23/17 140/70  04/11/17 (!) 142/74     Patient Active Problem List   Diagnosis Date Noted  . Medicare annual wellness visit, subsequent 10/04/2014  . Aortic stenosis 09/29/2014  . Bruit 09/29/2014  . S/P gastric bypass 07/23/2014  . Abdominal aortic aneurysm ([place])  07/23/2014  . BCC (basal cell carcinoma) 07/23/2014  . SCC (squamous cell carcinoma), arm 07/23/2014  . Essential hypertension, benign 07/08/2014  . Coronary artery disease 07/08/2014  . Hyperlipidemia LDL goal <100 07/08/2014  . Type II diabetes mellitus, well controlled ([place]) 07/08/2014  . Colon cancer screening 07/08/2014    Past Medical History:  Diagnosis Date  . CAD (coronary artery disease)   . Diabetes mellitus type II, controlled ([place])   . Heart disease    Ablation  . History of chicken pox   . Hyperlipidemia   . Hypertension   . Umbilical hernia     Past Surgical History:  Procedure Laterality Date  . ABLATION     Rhythm problem; rhythm unknown; [place] GRAFT  2001  . EXPLORATION POST OPERATIVE OPEN HEART  2001, June  . TONSILLECTOMY    . UMBILICAL HERNIA REPAIR  2015    Social History   Tobacco Use  . Smoking status: Former Research scientist (life sciences)  . Smokeless tobacco: Never Used  Substance Use Topics  . Alcohol use: No    Alcohol/week: 0.0 oz  . Drug use: No    Family History  Problem Relation Age of Onset  . Diabetes Mother 53       Deceased  . Heart disease Father 66       Deceased  . Heart disease Maternal Uncle   . Diabetes Maternal Uncle   . Heart disease Brother   . Diabetes Brother   . Diabetes Sister        #1  . Heart disease Sister        #2  . Hyperlipidemia Son        x2    No Known Allergies  Medication list has been reviewed and updated.  Current Outpatient Medications on File Prior to Visit  Medication Sig Dispense Refill  . amLODipine (NORVASC) 10 MG tablet TAKE 1/2 TABLET BY MOUTH DAILY 45 tablet 1  . aspirin 81 MG tablet Take 81 mg by mouth daily.    Marland Kitchen atorvastatin (LIPITOR) 80 MG tablet Take 0.5 tablets (40 mg total) by mouth at bedtime. 45 tablet 1  . glucose blood (BAYER CONTOUR TEST) test strip TEST ONCE DAILY FOR DIABETES Dx Code: E11.9 100 each 11  . glyBURIDE (DIABETA) 5 MG tablet TAKE 1/2  (  ONE-HALF) TABLET BY MOUTH TWICE DAILY WITH MEALS 90 tablet 1  . losartan (COZAAR) 100 MG tablet TAKE 1/2 TABLET BY MOUTH 2 TIMES A DAY 90 tablet 3  . metFORMIN (GLUCOPHAGE) 500 MG tablet TAKE 2 TABLETS BY MOUTH EVERY MORNING THEN TAKE 1 TABLET BY MOUTH EVERY EVENING 270 tablet 1  . spironolactone (ALDACTONE) 25 MG tablet Take 0.5 tablets (12.5 mg total) by mouth daily. 45 tablet 1   No current facility-administered medications on file prior to visit.     Review of Systems:  As per HPI- otherwise negative. No fever or chills No vomiting   Physical Examination: Vitals:   10/23/17 1355  BP: 130/70  Pulse: 70  Temp: 97.9 F (36.6 C)  SpO2: 97%   Vitals:   10/23/17 1355  Weight: 177 lb (80.3 kg)  Height: 5\' 8"  (1.727 m)   Body mass index is 26.91 kg/m. Ideal Body Weight: Weight in (lb) to have BMI = 25: 164.1  GEN: WDWN, NAD, Non-toxic, A & O x 3, looks well, very strong and healthy for age HEENT: Atraumatic, Normocephalic. Neck supple. No masses, No LAD.  Bilateral TM wnl, oropharynx normal.  PEERL,EOMI.   Ears and Nose: No external deformity. CV: RRR, No M/G/R. No JVD. No thrill. No extra heart sounds. PULM: CTA B, no wheezes, crackles, rhonchi. No retractions. No resp. distress. No accessory muscle use. ABD: S, NT, ND, +BS. No rebound. No HSM. EXTR: No c/c/e NEURO Normal gait. Normal strength of all extremities and normal facial movement.  Negative romberg PSYCH: Normally interactive. Conversant. Not depressed or anxious appearing.  Calm demeanor.   Laying 140/72, 70 Sitting 140/68, 71 Standing 140/70, 72 Assessment and Plan: Pressure in head - Plan: amoxicillin (AMOXIL) 500 MG capsule  Chronic tension-type headache, not intractable - Plan: CT Head Wo Contrast  Aortic valve stenosis, etiology of cardiac valve disease unspecified  Here today with a feeling of pressure in his head, and dizziness described as lightheadedness He also was noted to have a worsening of  his aortic stenosis on recent echo Will try amoxicillin for sinus infection, and arrange CT of his head Contacted cardiology and made him an appt for next week   Signed Lamar Blinks, MD  Monday 11:30 at [facility] ,with Rosaria Ferries  Received his CT head 12/6  Ct Head Wo Contrast  Result Date: 10/24/2017 CLINICAL DATA:  Dizziness with pressure sensation in the retro-orbital regions EXAM: CT HEAD WITHOUT CONTRAST TECHNIQUE: Contiguous axial images were obtained from the base of the skull through the vertex without intravenous contrast. COMPARISON:  None. FINDINGS: Brain: There is mild diffuse atrophy. There is no intracranial mass, hemorrhage, extra-axial fluid collection, or midline shift. There is subtle decreased attenuation in the posterior, medial right occipital lobe. This area is concerning for potential recent infarct. There is patchy small vessel disease in the centra semiovale bilaterally. Elsewhere gray-white compartments are normal. Vascular: No hyperdense vessel. There is calcification in each carotid siphon. Skull: The bony calvarium appears intact. Sinuses/Orbits: There is mild mucosal thickening in several ethmoid air cells. There is a small retention cyst in the anterior right sphenoid sinus. Other paranasal sinuses which are visualized are clear. There is rightward deviation of the nasal septum. Orbits appear symmetric bilaterally. Other: Mastoid air cells are clear. IMPRESSION: 1. Decreased attenuation in the posterior, medial right occipital lobe. This area is concerning for potential recent infarct. 2. There is mild atrophy with patchy periventricular small vessel disease. 3.  No hemorrhage or extra-axial  fluid.  No well-defined mass. 4.  There are foci of arterial vascular calcification. 5.  Mild paranasal sinus disease.  Deviated nasal septum. These results will be called to the ordering clinician or representative by the Radiologist Assistant, and communication documented in  the PACS or zVision Dashboard. Electronically Signed   By: Lowella Grip III M.D.   On: 10/24/2017 14:19   Spoke with Dr. Jasmine December who recommends an MRI- will order now.  Called pt to go over these findings, explained that we will order an MRI.  He states understanding. He does not have any internal device   12/7- working on getting MRI scheduled Spoke with Dr. Posey Pronto at [facility] neurology- MRI with and without would be beneficial in this case.  Updated with imaging, let them know that we need with and without. Should be able to get MRI tomorrow.

## 2017-10-23 ENCOUNTER — Ambulatory Visit (INDEPENDENT_AMBULATORY_CARE_PROVIDER_SITE_OTHER): Payer: Medicare Other | Admitting: Family Medicine

## 2017-10-23 ENCOUNTER — Encounter: Payer: Self-pay | Admitting: Family Medicine

## 2017-10-23 VITALS — BP 140/72 | HR 70 | Temp 97.9°F | Ht 68.0 in | Wt 177.0 lb

## 2017-10-23 DIAGNOSIS — G44229 Chronic tension-type headache, not intractable: Secondary | ICD-10-CM

## 2017-10-23 DIAGNOSIS — R930 Abnormal findings on diagnostic imaging of skull and head, not elsewhere classified: Secondary | ICD-10-CM | POA: Diagnosis not present

## 2017-10-23 DIAGNOSIS — R51 Headache: Secondary | ICD-10-CM | POA: Diagnosis not present

## 2017-10-23 DIAGNOSIS — I350 Nonrheumatic aortic (valve) stenosis: Secondary | ICD-10-CM

## 2017-10-23 DIAGNOSIS — R519 Headache, unspecified: Secondary | ICD-10-CM

## 2017-10-23 MED ORDER — AMOXICILLIN 500 MG PO CAPS: 1000.0000 mg | ORAL_CAPSULE | Freq: Two times a day (BID) | ORAL | 0 refills | Status: DC

## 2017-10-23 MED FILL — AMOXICILLIN 500 MG CAPSULE: 500 | 10 days supply | Qty: 40 | Fill #0

## 2017-10-23 NOTE — Patient Instructions (Signed)
It was good to see you today- you have a cardiology appt next week to discuss your aortic stenosis. We will also get a CT of your head, and try a course of antibiotic in case this is a sinus infection. If all looks well, we will have you see neurology  Please let me know if any change or worsening in your symptoms

## 2017-10-24 ENCOUNTER — Ambulatory Visit (HOSPITAL_BASED_OUTPATIENT_CLINIC_OR_DEPARTMENT_OTHER)
Admission: RE | Admit: 2017-10-24 | Discharge: 2017-10-24 | Disposition: A | Discharge status: Home / Self Care | Payer: Medicare Other | Source: Ambulatory Visit | Attending: Family Medicine | Admitting: Family Medicine

## 2017-10-24 ENCOUNTER — Telehealth: Payer: Self-pay | Admitting: Family Medicine

## 2017-10-24 DIAGNOSIS — G44229 Chronic tension-type headache, not intractable: Secondary | ICD-10-CM

## 2017-10-24 DIAGNOSIS — I739 Peripheral vascular disease, unspecified: Secondary | ICD-10-CM | POA: Diagnosis not present

## 2017-10-24 DIAGNOSIS — R42 Dizziness and giddiness: Secondary | ICD-10-CM | POA: Diagnosis not present

## 2017-10-24 DIAGNOSIS — J342 Deviated nasal septum: Secondary | ICD-10-CM | POA: Diagnosis not present

## 2017-10-24 DIAGNOSIS — G319 Degenerative disease of nervous system, unspecified: Secondary | ICD-10-CM | POA: Diagnosis not present

## 2017-10-24 NOTE — Telephone Encounter (Signed)
Forwarding to PCP.

## 2017-10-24 NOTE — Telephone Encounter (Signed)
Call report for [facility] from CT: area concerning for potential recent infarct to posterior right medial occipital lobe. Fax number given to Marzetta Board [phone]) and called report to [facility] CMA at office ph [phone] and routed high priority

## 2017-10-24 NOTE — Addendum Note (Signed)
Addended by: Lamar Blinks C on: 10/24/2017 03:00 PM   Modules accepted: Orders

## 2017-10-25 NOTE — Addendum Note (Signed)
Addended by: Lamar Blinks C on: 10/25/2017 12:55 PM   Modules accepted: Orders

## 2017-10-26 ENCOUNTER — Ambulatory Visit (HOSPITAL_BASED_OUTPATIENT_CLINIC_OR_DEPARTMENT_OTHER): Payer: Medicare Other

## 2017-10-26 ENCOUNTER — Ambulatory Visit (HOSPITAL_COMMUNITY)
Admission: RE | Admit: 2017-10-26 | Discharge: 2017-10-26 | Disposition: A | Discharge status: Home / Self Care | Payer: Medicare Other | Source: Ambulatory Visit | Attending: Family Medicine | Admitting: Family Medicine

## 2017-10-26 ENCOUNTER — Ambulatory Visit (HOSPITAL_BASED_OUTPATIENT_CLINIC_OR_DEPARTMENT_OTHER)
Admission: RE | Admit: 2017-10-26 | Discharge: 2017-10-26 | Disposition: A | Discharge status: Home / Self Care | Payer: Medicare Other | Attending: Internal Medicine | Admitting: Internal Medicine

## 2017-10-26 ENCOUNTER — Telehealth: Payer: Self-pay | Admitting: Family Medicine

## 2017-10-26 ENCOUNTER — Other Ambulatory Visit: Payer: Self-pay | Admitting: Family Medicine

## 2017-10-26 DIAGNOSIS — R42 Dizziness and giddiness: Secondary | ICD-10-CM | POA: Diagnosis not present

## 2017-10-26 DIAGNOSIS — R930 Abnormal findings on diagnostic imaging of skull and head, not elsewhere classified: Secondary | ICD-10-CM | POA: Insufficient documentation

## 2017-10-26 MED ORDER — GADOBENATE DIMEGLUMINE 529 MG/ML IV SOLN
15.0000 mL | Freq: Once | INTRAVENOUS | Status: AC
  Administered 2017-10-26: 15 mL via INTRAVENOUS

## 2017-10-26 NOTE — Telephone Encounter (Signed)
Called pt and discussed MRI- it is normal except for age related change. Good news.  He is seeing neurology this coming week to discuss his dizziness.  He will call me if any concerns in the meantime

## 2017-10-28 ENCOUNTER — Ambulatory Visit: Payer: Medicare Other | Admitting: Physician Assistant

## 2017-10-30 ENCOUNTER — Encounter: Payer: Self-pay | Admitting: Neurology

## 2017-10-30 ENCOUNTER — Ambulatory Visit (INDEPENDENT_AMBULATORY_CARE_PROVIDER_SITE_OTHER): Payer: Medicare Other | Admitting: Neurology

## 2017-10-30 VITALS — BP 138/72 | HR 78 | Ht 68.0 in | Wt 177.0 lb

## 2017-10-30 DIAGNOSIS — R42 Dizziness and giddiness: Secondary | ICD-10-CM | POA: Diagnosis not present

## 2017-10-30 NOTE — Progress Notes (Signed)
NEUROLOGY CONSULTATION NOTE  PRINSTON KYNARD MRN: [id number] DOB: [birth year]  Referring provider: Dr. [name] Primary care provider: Dr. [name]  Reason for consult:  dizziness  HISTORY OF PRESENT ILLNESS: Peter Scott is an 83 year old male with hypertension, type 2 diabetes, CAD who presents for head pressure and dizziness.  History supplemented by PCP note.  CT and MRI of head personally reviewed.  In September, he was experiencing positional vertigo with brief head spinning when he would turn over in bed.  He responded to vestibular rehab and was taught to do the Epley maneuver.    The following month, he began experiencing a different dizziness.  It was a sensation of lightheadedness, like he is going to pass out.  There is no spinning or other movement sensation.  It is also positional, triggered by standing up or walking.  He is unsteady on his feet when he experiences it.  It is brief.  He does not experience symptoms if he is sitting or laying still.  On some occasion, he feels a little nauseous.  He denies double vision, hearing loss or unilateral numbness or weakness.  He does have chronic tinnitus in his left ear.  In addition, he had been experiencing a frontal and periorbital pressure but not a headache.  He was prescribed amoxicillin last week and symptoms have improved although not resolved.     Head CT from 12/6/8 showed decreased attenuation in the posterior medial right occipital lobe concerning for recent infarct.  However, follow up MRI of brain showed mild to moderate chronic small vessel changes but confirmed no infarct.  He has CAD and aortic valve stenosis.  Recent echocardiogram showed possible worsening of aortic stenosis.  09/23/17 LABS:  CMP with Na 137, K 4.5, Cl 102, CO2 28, glucose 119, BUN 23, Cr 1.22, t bili 0.4, ALP 57, AST 16 and ALT 19; CBC with WBC 8.9, HGB 14.3, HCT 42.5, PLT 182; HGB A1c 8.1  PAST MEDICAL HISTORY: Past Medical History:  Diagnosis  Date  . CAD (coronary artery disease)   . Diabetes mellitus type II, controlled ([place])   . Heart disease    Ablation  . History of chicken pox   . Hyperlipidemia   . Hypertension   . Umbilical hernia     PAST SURGICAL HISTORY: Past Surgical History:  Procedure Laterality Date  . ABLATION     Rhythm problem; rhythm unknown; [place] GRAFT  2001  . EXPLORATION POST OPERATIVE OPEN HEART  2001, June  . TONSILLECTOMY    . UMBILICAL HERNIA REPAIR  2015    MEDICATIONS: Current Outpatient Medications on File Prior to Visit  Medication Sig Dispense Refill  . amLODipine (NORVASC) 10 MG tablet TAKE 1/2 TABLET BY MOUTH DAILY 45 tablet 1  . amoxicillin (AMOXIL) 500 MG capsule Take 2 capsules (1,000 mg total) by mouth 2 (two) times daily. 40 capsule 0  . aspirin 81 MG tablet Take 81 mg by mouth daily.    Marland Kitchen atorvastatin (LIPITOR) 80 MG tablet Take 0.5 tablets (40 mg total) by mouth at bedtime. 45 tablet 1  . glucose blood (BAYER CONTOUR TEST) test strip TEST ONCE DAILY FOR DIABETES Dx Code: E11.9 100 each 11  . glyBURIDE (DIABETA) 5 MG tablet TAKE 1/2 (ONE-HALF) TABLET BY MOUTH TWICE DAILY WITH MEALS 90 tablet 1  . losartan (COZAAR) 100 MG tablet TAKE 1/2 TABLET BY MOUTH 2 TIMES A DAY 90 tablet 3  . metFORMIN (  GLUCOPHAGE) 500 MG tablet TAKE 2 TABLETS BY MOUTH EVERY MORNING THEN TAKE 1 TABLET BY MOUTH EVERY EVENING 270 tablet 1  . spironolactone (ALDACTONE) 25 MG tablet Take 0.5 tablets (12.5 mg total) by mouth daily. 45 tablet 1   No current facility-administered medications on file prior to visit.     ALLERGIES: No Known Allergies  FAMILY HISTORY: Family History  Problem Relation Age of Onset  . Diabetes Mother 79       Deceased  . Heart disease Father 21       Deceased  . Heart disease Maternal Uncle   . Diabetes Maternal Uncle   . Heart disease Brother   . Diabetes Brother   . Diabetes Sister        #1  . Heart disease Sister        #2  .  Hyperlipidemia Son        x2    SOCIAL HISTORY: Social History   Socioeconomic History  . Marital status: Married    Spouse name: Not on file  . Number of children: 2  . Years of education: Not on file  . Highest education level: Not on file  Social Needs  . Financial resource strain: Not on file  . Food insecurity - worry: Not on file  . Food insecurity - inability: Not on file  . Transportation needs - medical: Not on file  . Transportation needs - non-medical: Not on file  Occupational History  . Not on file  Tobacco Use  . Smoking status: Former Research scientist (life sciences)  . Smokeless tobacco: Never Used  Substance and Sexual Activity  . Alcohol use: No    Alcohol/week: 0.0 oz  . Drug use: No  . Sexual activity: Not on file  Other Topics Concern  . Not on file  Social History Narrative  . Not on file    REVIEW OF SYSTEMS: Constitutional: No fevers, chills, or sweats, no generalized fatigue, change in appetite Eyes: No visual changes, double vision, eye pain Ear, nose and throat: No hearing loss, ear pain, nasal congestion, sore throat Cardiovascular: No chest pain, palpitations Respiratory:  No shortness of breath at rest or with exertion, wheezes GastrointestinaI: No nausea, vomiting, diarrhea, abdominal pain, fecal incontinence Genitourinary:  No dysuria, urinary retention or frequency Musculoskeletal:  No neck pain, back pain Integumentary: No rash, pruritus, skin lesions Neurological: as above Psychiatric: No depression, insomnia, anxiety Endocrine: No palpitations, fatigue, diaphoresis, mood swings, change in appetite, change in weight, increased thirst Hematologic/Lymphatic:  No purpura, petechiae. Allergic/Immunologic: no itchy/runny eyes, nasal congestion, recent allergic reactions, rashes  PHYSICAL EXAM: Vitals:   10/30/17 1254  BP: 138/72  Pulse: 78  SpO2: 94%   General: No acute distress.  Patient appears well-groomed.  Head:  Normocephalic/atraumatic Eyes:   fundi examined but not visualized Neck: supple, no paraspinal tenderness, full range of motion Back: No paraspinal tenderness Heart: regular rate and rhythm Lungs: Clear to auscultation bilaterally. Vascular: No carotid bruits. Neurological Exam: Mental status: alert and oriented to person, place, and time, recent and remote memory intact, fund of knowledge intact, attention and concentration intact, speech fluent and not dysarthric, language intact. Cranial nerves: CN I: not tested CN II: pupils equal, round and reactive to light, visual fields intact CN III, IV, VI:  full range of motion, no nystagmus, no ptosis CN V: facial sensation intact CN VII: upper and lower face symmetric CN VIII: hearing intact CN IX, X: gag intact, uvula midline CN XI: sternocleidomastoid and  trapezius muscles intact CN XII: tongue midline Bulk & Tone: normal, no fasciculations. Motor:  5/5 throughout  Sensation: temperature and vibration sensation intact. Deep Tendon Reflexes:  2+ throughout, toes downgoing.  Finger to nose testing:  Without dysmetria.  Heel to shin:  Without dysmetria.  Gait:  Cautious mildly wide-based.  Able to turn. Romberg negative.  IMPRESSION: Dizziness.  In September, his symptoms were consistent with benign paroxysmal positional vertigo.  This dizziness is best described as lightheadedness.  He does not endorse any lateralizing neurologic symptoms to suggest a posterior circulation cerebrovascular event (such as double vision, unilateral numbness or weakness).  MRI of brain is negative for stroke.  Orthostatic vitals were negative.  With his cardiac history, I would follow up with cardiac evaluation as a possible contributor.  Thank you for allowing me to take part in the care of this patient.  Metta Clines, DO  CC:  Lamar Blinks, MD

## 2017-10-30 NOTE — Patient Instructions (Signed)
I don't think the lightheadedness is neurologic or stroke.  I would first check to see if the heart may be causing it.   Follow up as needed.

## 2017-11-14 ENCOUNTER — Other Ambulatory Visit: Payer: Self-pay | Admitting: Family Medicine

## 2017-11-14 ENCOUNTER — Encounter: Payer: Self-pay | Admitting: *Deleted

## 2017-11-14 MED FILL — SPIRONOLACTONE 25 MG TABS: 25 | 90 days supply | Qty: 45 | Fill #0

## 2017-11-22 ENCOUNTER — Ambulatory Visit (INDEPENDENT_AMBULATORY_CARE_PROVIDER_SITE_OTHER): Payer: Medicare Other | Admitting: Physician Assistant

## 2017-11-22 ENCOUNTER — Encounter: Payer: Self-pay | Admitting: Physician Assistant

## 2017-11-22 VITALS — BP 124/60 | HR 63 | Ht 68.0 in | Wt 179.0 lb

## 2017-11-22 DIAGNOSIS — I714 Abdominal aortic aneurysm, without rupture, unspecified: Secondary | ICD-10-CM

## 2017-11-22 DIAGNOSIS — I2510 Atherosclerotic heart disease of native coronary artery without angina pectoris: Secondary | ICD-10-CM | POA: Diagnosis not present

## 2017-11-22 DIAGNOSIS — I2583 Coronary atherosclerosis due to lipid rich plaque: Secondary | ICD-10-CM | POA: Diagnosis not present

## 2017-11-22 DIAGNOSIS — I350 Nonrheumatic aortic (valve) stenosis: Secondary | ICD-10-CM

## 2017-11-22 DIAGNOSIS — I10 Essential (primary) hypertension: Secondary | ICD-10-CM | POA: Diagnosis not present

## 2017-11-22 NOTE — Patient Instructions (Signed)
NO CHANGES WITH MEDICATIONS   Your physician recommends that you schedule a follow-up appointment in WITH DR CRENSHAW-NEXT AVAILABLE

## 2017-11-22 NOTE — Progress Notes (Signed)
Cardiology Office Note   Date:  11/22/2017   ID:  Peter Scott, DOB Dec 19, 1931, MRN [id number]  PCP:  Peter Perla, MD  Cardiologist: Dr. Stanford Scott, 11/23/2015 Peter Ferries, PA-C   No chief complaint on file.   History of Present Illness: Peter Scott is a 84 y.o. male with a history of CABG ([place]) 2001, carotid plaque w/out obstruction, nl EF by echo 2016 w/ grade 2 dd, mod AS/AI w/ mean grad 18 mm Hg, 3.8 x 3.3 AAA, DM, HTN, HLD, SVT w/ ablation in [place].  Peter Scott presents for cardiology follow up.  Around the first of November, Peter Scott began getting light-headed whenever he bends over and stands up. He had a head CT and MRI. He did a round of Amoxil, sx improved but did not resolve. He has had vertigo before, this is different.   He has been more fatigued than usual, can only work or mow for an hour or so before he has to stop and rest. This is different for him, it was not sudden but has been coming on gradually for months.  He denies LE edema, orthopnea or PND.   He exercises at the gym, in addition to deer-hunting, yard work and running a chain saw. However he feels his stamina is less than previous.  He wonders if something is wrong or if this is due to the aging process.  He has not had chest pain.   Past Medical History:  Diagnosis Date  . CAD (coronary artery disease)   . Diabetes mellitus type II, controlled ([place])   . Heart disease    Ablation  . History of chicken pox   . Hyperlipidemia   . Hypertension   . Umbilical hernia     Past Surgical History:  Procedure Laterality Date  . ABLATION     Rhythm problem; rhythm unknown; [place] GRAFT  2001  . EXPLORATION POST OPERATIVE OPEN HEART  2001, June  . TONSILLECTOMY    . UMBILICAL HERNIA REPAIR  2015    Current Outpatient Medications  Medication Sig Dispense Refill  . amLODipine (NORVASC) 10 MG tablet TAKE 1/2 TABLET BY MOUTH DAILY 45 tablet 1  . aspirin 81 MG  tablet Take 81 mg by mouth daily.    Marland Kitchen atorvastatin (LIPITOR) 80 MG tablet Take 0.5 tablets (40 mg total) by mouth at bedtime. 45 tablet 1  . glucose blood (BAYER CONTOUR TEST) test strip TEST ONCE DAILY FOR DIABETES Dx Code: E11.9 100 each 11  . glyBURIDE (DIABETA) 5 MG tablet TAKE 1/2 (ONE-HALF) TABLET BY MOUTH TWICE DAILY WITH MEALS 90 tablet 1  . losartan (COZAAR) 100 MG tablet TAKE 1/2 TABLET BY MOUTH 2 TIMES A DAY 90 tablet 3  . metFORMIN (GLUCOPHAGE) 500 MG tablet TAKE 2 TABLETS BY MOUTH EVERY MORNING THEN TAKE 1 TABLET BY MOUTH EVERY EVENING 270 tablet 1  . spironolactone (ALDACTONE) 25 MG tablet Take 0.5 tablets (12.5 mg total) by mouth daily. 45 tablet 1  . amoxicillin (AMOXIL) 500 MG capsule Take 2 capsules (1,000 mg total) by mouth 2 (two) times daily. 40 capsule 0   No current facility-administered medications for this visit.     Allergies:   Patient has no known allergies.    Social History:  The patient  reports that he has quit smoking. he has never used smokeless tobacco. He reports that he does not drink alcohol or use drugs.   Family  History:  The patient's family history includes Diabetes in his brother, maternal uncle, and sister; Diabetes (age of onset: 64) in his mother; Heart disease in his brother, maternal uncle, and sister; Heart disease (age of onset: 22) in his father; Hyperlipidemia in his son.    ROS:  Please see the history of present illness. All other systems are reviewed and negative.    PHYSICAL EXAM: VS:  BP 124/60   Pulse 63   Ht 5\' 8"  (1.727 m)   Wt 179 lb (81.2 kg)   BMI 27.22 kg/m  , BMI Body mass index is 27.22 kg/m. GEN: Well nourished, well developed, male in no acute distress  HEENT: normal for age  Neck: no JVD, no carotid bruit, no masses Cardiac: RRR; 2/6 murmur, no rubs, or gallops Respiratory:  clear to auscultation bilaterally, normal work of breathing GI: soft, nontender, nondistended, + BS MS: no deformity or atrophy; no edema;  distal pulses are 2+ in all 4 extremities   Skin: warm and dry, no rash Neuro:  Strength and sensation are intact Psych: euthymic mood, full affect   EKG:  EKG is ordered today. The ekg ordered today demonstrates sinus rhythm, heart rate 63, no acute ischemic changes and no significant change from 2017.  ECHO: 10/09/2017 - Left ventricle: The cavity size was normal. Wall thickness was   increased in a pattern of moderate LVH. There was moderate   concentric hypertrophy. Systolic function was normal. The   estimated ejection fraction was in the range of 55% to 60%. Wall   motion was normal; there were no regional wall motion   abnormalities. Features are consistent with a pseudonormal left   ventricular filling pattern, with concomitant abnormal relaxation   and increased filling pressure (grade 2 diastolic dysfunction).   Doppler parameters are consistent with high ventricular filling   pressure. - Aortic valve: Cusp separation was severely reduced. Valve   mobility was restricted. There was moderate to severe stenosis.   There was mild regurgitation. Valve area (VTI): 0.82 cm^2. Valve   area (Vmax): 0.77 cm^2. Valve area (Vmean): 0.82 cm^2.    Mean gradient (S): 29 mm Hg. Peak gradient (S): 50 mm Hg - Mitral valve: There was mild regurgitation. - Left atrium: The atrium was mildly dilated.  MYOVIEW: 11/23/2014 Impression Exercise Capacity:  Excellent exercise capacity. BP Response:  Normal blood pressure response. Clinical Symptoms:  No significant symptoms noted. ECG Impression:  No significant ST segment change suggestive of ischemia. Comparison with Prior Nuclear Study: No images to compare Overall Impression:  Low risk stress nuclear study Subtle anterior ischemia, mild thinning inferolateral wall. LV Wall Motion:  Mild global hypokinesis   Ct Head Wo Contrast  Result Date: 10/24/2017 CLINICAL DATA:  Dizziness with pressure sensation in the retro-orbital regions EXAM:  CT HEAD WITHOUT CONTRAST TECHNIQUE: Contiguous axial images were obtained from the base of the skull through the vertex without intravenous contrast. COMPARISON:  None. FINDINGS: Brain: There is mild diffuse atrophy. There is no intracranial mass, hemorrhage, extra-axial fluid collection, or midline shift. There is subtle decreased attenuation in the posterior, medial right occipital lobe. This area is concerning for potential recent infarct. There is patchy small vessel disease in the centra semiovale bilaterally. Elsewhere gray-white compartments are normal. Vascular: No hyperdense vessel. There is calcification in each carotid siphon. Skull: The bony calvarium appears intact. Sinuses/Orbits: There is mild mucosal thickening in several ethmoid air cells. There is a small retention cyst in the anterior right sphenoid  sinus. Other paranasal sinuses which are visualized are clear. There is rightward deviation of the nasal septum. Orbits appear symmetric bilaterally. Other: Mastoid air cells are clear. IMPRESSION: 1. Decreased attenuation in the posterior, medial right occipital lobe. This area is concerning for potential recent infarct. 2. There is mild atrophy with patchy periventricular small vessel disease. 3.  No hemorrhage or extra-axial fluid.  No well-defined mass. 4.  There are foci of arterial vascular calcification. 5.  Mild paranasal sinus disease.  Deviated nasal septum. These results will be called to the ordering clinician or representative by the Radiologist Assistant, and communication documented in the PACS or zVision Dashboard. Electronically Signed   By: Lowella Grip III M.D.   On: 10/24/2017 14:19   Peter Jeri Cos EQ Contrast  Result Date: 10/26/2017 CLINICAL DATA:  Position all dizziness and dizziness with acceleration. Pressure behind the eyes. Abnormal head CT. EXAM: MRI HEAD WITHOUT AND WITH CONTRAST TECHNIQUE: Multiplanar, multiecho pulse sequences of the brain and surrounding structures  were obtained without and with intravenous contrast. CONTRAST:  88mL MULTIHANCE GADOBENATE DIMEGLUMINE 529 MG/ML IV SOLN COMPARISON:  11/03/2017 FINDINGS: Brain: Diffusion imaging does not show any acute or subacute infarction. There are minimal chronic small-vessel ischemic changes of the pons and cerebellum. Cerebral hemispheres show mild chronic small-vessel ischemic changes of the white matter. No cortical or large vessel territory infarction. No occipital infarction as was questioned by CT. No sign of mass lesion, hemorrhage, hydrocephalus or extra-axial collection. After contrast administration, no abnormal enhancement occurs. Vascular: Major vessels at the base of the brain show flow. Skull and upper cervical spine: Negative Sinuses/Orbits: Clear/ normal. No fluid in the mastoids or middle ears. Other: None IMPRESSION: No acute or reversible finding. No cause of the presenting symptoms is identified. Mild to moderate chronic small-vessel ischemic changes affecting the brain. No cortical infarction as was questioned by CT. Electronically Signed   By: Sundby Chimes M.D.   On: 10/26/2017 09:51    Recent Labs: 09/23/2017: ALT 19; BUN 23; Creatinine, Ser 1.22; Hemoglobin 14.3; Platelets 182.0 R; Potassium 4.5; Sodium 137    Lipid Panel    Component Value Date/Time   CHOL 123 09/23/2017 1100   TRIG 171.0 (H) 09/23/2017 1100   HDL 33.10 (L) 09/23/2017 1100   CHOLHDL 4 09/23/2017 1100   VLDL 34.2 09/23/2017 1100   LDLCALC 56 09/23/2017 1100     Wt Readings from Last 3 Encounters:  11/22/17 179 lb (81.2 kg)  10/30/17 177 lb (80.3 kg)  10/23/17 177 lb (80.3 kg)     Other studies Reviewed: Additional studies/ records that were reviewed today include: Office notes, hospital records and testing.  ASSESSMENT AND PLAN:  1.  Aortic stenosis: Upon review of his echocardiograms, his mean gradient has gone from 18 up to 29 and his peak gradient from 29 up to 50.  He is having some problems with being  lightheaded when he first does position changes, but it resolves very quickly.  He is not volume overloaded by exam.  The only diuretic he is on is a low dose of Aldactone.  I explained that at some point, his valve will be tight enough that he may need a procedure and that there is a clinic that can see him at that time.  He is to follow-up with Dr. Stanford Scott and determine if it is time for him to be referred to the TAVR clinic.  I described the procedure to him and he would consider this.  He greatly prefers TAVR over another open heart surgery.  2.  Hypertension: Blood pressures under good control on current medications  3.  CAD: He is on aspirin, high-dose statin.  He is not on a beta-blocker, his heart rate is borderline and his blood pressure is well controlled, so we will not add at this time.  4.  AAA: He is aware of his ultrasound appointment and will keep it.   Current medicines are reviewed at length with the patient today.  The patient does not have concerns regarding medicines.  The following changes have been made:  no change  Labs/ tests ordered today include:   Orders Placed This Encounter  Procedures  . EKG 12-Lead     Disposition:   FU with Dr. Stanford Scott  Signed, Peter Ferries, PA-C  11/22/2017 5:07 PM    [place] Phone: [phone]; Fax: [phone]  This note was written with the assistance of speech recognition software. Please excuse any transcriptional errors.

## 2017-11-27 ENCOUNTER — Other Ambulatory Visit: Payer: Self-pay | Admitting: Family Medicine

## 2017-11-27 ENCOUNTER — Ambulatory Visit (HOSPITAL_BASED_OUTPATIENT_CLINIC_OR_DEPARTMENT_OTHER)
Admission: RE | Admit: 2017-11-27 | Discharge: 2017-11-27 | Disposition: A | Discharge status: Home / Self Care | Payer: Medicare Other | Source: Ambulatory Visit | Attending: Family Medicine | Admitting: Family Medicine

## 2017-11-27 ENCOUNTER — Encounter: Payer: Self-pay | Admitting: Family Medicine

## 2017-11-27 DIAGNOSIS — I714 Abdominal aortic aneurysm, without rupture, unspecified: Secondary | ICD-10-CM

## 2017-12-27 MED FILL — LOSARTAN POTASSIUM 100 MG T: 100 | 90 days supply | Qty: 90 | Fill #2

## 2018-01-03 MED FILL — ATORVASTATIN 80 MG TABLET: 80 | 90 days supply | Qty: 45 | Fill #2

## 2018-01-08 DIAGNOSIS — L570 Actinic keratosis: Secondary | ICD-10-CM | POA: Diagnosis not present

## 2018-01-08 NOTE — H&P (View-Only) (Signed)
HPI: FU CAD and AS. Patient is status post coronary artery bypass graft in [place] in 2001. He also had ablation of what sounds to be SVT at that time. Carotid dopplers 11/15 showed plaque but no significant stenosis. Nuclear study 1/16 showed EF 49 and subtle anterior ischemia.  Last echocardiogram November 2018 showed normal LV function, grade 2 diastolic dysfunction, moderate to severe aortic stenosis with mean gradient recorded at 29 mmHg, mild aortic and mitral regurgitation and mild left atrial enlargement.  Abdominal ultrasound January 2019 showed abdominal aortic aneurysm measuring 3.9 cm.  Since last seen, patient notes increasing fatigue and dyspnea on exertion.  This occurs with more vigorous activities but not routine activities.  Occasional palpitations at night.  No orthopnea, PND, pedal edema or chest pain.  He did have dizziness over the fall that increased with standing.  He has not had syncope.  Current Outpatient Medications  Medication Sig Dispense Refill  . amLODipine (NORVASC) 10 MG tablet TAKE 1/2 TABLET BY MOUTH DAILY 45 tablet 1  . aspirin 81 MG tablet Take 81 mg by mouth daily.    Marland Kitchen atorvastatin (LIPITOR) 80 MG tablet Take 0.5 tablets (40 mg total) by mouth at bedtime. 45 tablet 1  . glucose blood (BAYER CONTOUR TEST) test strip TEST ONCE DAILY FOR DIABETES Dx Code: E11.9 100 each 11  . glyBURIDE (DIABETA) 5 MG tablet TAKE 1/2 (ONE-HALF) TABLET BY MOUTH TWICE DAILY WITH MEALS 90 tablet 1  . losartan (COZAAR) 100 MG tablet TAKE 1/2 TABLET BY MOUTH 2 TIMES A DAY 90 tablet 3  . metFORMIN (GLUCOPHAGE) 500 MG tablet TAKE 2 TABLETS BY MOUTH EVERY MORNING THEN TAKE 1 TABLET BY MOUTH EVERY EVENING 270 tablet 1  . spironolactone (ALDACTONE) 25 MG tablet Take 0.5 tablets (12.5 mg total) by mouth daily. 45 tablet 1   No current facility-administered medications for this visit.      Past Medical History:  Diagnosis Date  . CAD (coronary artery disease)   . Diabetes  mellitus type II, controlled ([place])   . Heart disease    Ablation  . History of chicken pox   . Hyperlipidemia   . Hypertension   . Umbilical hernia     Past Surgical History:  Procedure Laterality Date  . ABLATION     Rhythm problem; rhythm unknown; [place] GRAFT  2001  . EXPLORATION POST OPERATIVE OPEN HEART  2001, June  . TONSILLECTOMY    . UMBILICAL HERNIA REPAIR  2015    Social History   Socioeconomic History  . Marital status: Married    Spouse name: Not on file  . Number of children: 2  . Years of education: Not on file  . Highest education level: Not on file  Social Needs  . Financial resource strain: Not on file  . Food insecurity - worry: Not on file  . Food insecurity - inability: Not on file  . Transportation needs - medical: Not on file  . Transportation needs - non-medical: Not on file  Occupational History  . Not on file  Tobacco Use  . Smoking status: Former Research scientist (life sciences)  . Smokeless tobacco: Never Used  Substance and Sexual Activity  . Alcohol use: No    Alcohol/week: 0.0 oz  . Drug use: No  . Sexual activity: Not on file  Other Topics Concern  . Not on file  Social History Narrative  . Not on file    Family History  Problem Relation Age of Onset  . Diabetes Mother 65       Deceased  . Heart disease Father 76       Deceased  . Heart disease Maternal Uncle   . Diabetes Maternal Uncle   . Heart disease Brother   . Diabetes Brother   . Diabetes Sister        #1  . Heart disease Sister        #2  . Hyperlipidemia Son        x2    ROS: no fevers or chills, productive cough, hemoptysis, dysphasia, odynophagia, melena, hematochezia, dysuria, hematuria, rash, seizure activity, orthopnea, PND, pedal edema, claudication. Remaining systems are negative.  Physical Exam: Well-developed well-nourished in no acute distress.  Skin is warm and dry.  HEENT is normal.  Neck is supple.  Chest is clear to auscultation  with normal expansion.  Cardiovascular exam is regular rate and rhythm.  3/6 systolic murmur left sternal border.  S2 is not diminished. Abdominal exam nontender or distended. No masses palpated. Extremities show no edema. neuro grossly intact   A/P  1 coronary artery disease-patient is doing well with no chest pain.  Continue medical therapy with aspirin and statin.  2 aortic stenosis-patient has increasing dyspnea and fatigue with more vigorous activities.  His most recent echocardiogram was personally reviewed.  His mean gradient suggest moderate aortic stenosis but dimensionless index of 0.24 suggest severe.  I will arrange a transesophageal echocardiogram to better assess aortic valve morphology and hopefully reassess his gradient.  He will likely need referral for consideration of TAVR.   3 abdominal aortic aneurysm-patient will need follow-up study January 2020.  4 hypertension-blood pressure is mildly elevated.  However he has some orthostatic symptoms.  We will continue present medications and follow.  Advance as needed.  5 hyperlipidemia-continue statin.  Kirk Ruths, MD

## 2018-01-08 NOTE — Progress Notes (Signed)
HPI: FU CAD and AS. Patient is status post coronary artery bypass graft in [place] in 2001. He also had ablation of what sounds to be SVT at that time. Carotid dopplers 11/15 showed plaque but no significant stenosis. Nuclear study 1/16 showed EF 49 and subtle anterior ischemia.  Last echocardiogram November 2018 showed normal LV function, grade 2 diastolic dysfunction, moderate to severe aortic stenosis with mean gradient recorded at 29 mmHg, mild aortic and mitral regurgitation and mild left atrial enlargement.  Abdominal ultrasound January 2019 showed abdominal aortic aneurysm measuring 3.9 cm.  Since last seen, patient notes increasing fatigue and dyspnea on exertion.  This occurs with more vigorous activities but not routine activities.  Occasional palpitations at night.  No orthopnea, PND, pedal edema or chest pain.  He did have dizziness over the fall that increased with standing.  He has not had syncope.  Current Outpatient Medications  Medication Sig Dispense Refill  . amLODipine (NORVASC) 10 MG tablet TAKE 1/2 TABLET BY MOUTH DAILY 45 tablet 1  . aspirin 81 MG tablet Take 81 mg by mouth daily.    Marland Kitchen atorvastatin (LIPITOR) 80 MG tablet Take 0.5 tablets (40 mg total) by mouth at bedtime. 45 tablet 1  . glucose blood (BAYER CONTOUR TEST) test strip TEST ONCE DAILY FOR DIABETES Dx Code: E11.9 100 each 11  . glyBURIDE (DIABETA) 5 MG tablet TAKE 1/2 (ONE-HALF) TABLET BY MOUTH TWICE DAILY WITH MEALS 90 tablet 1  . losartan (COZAAR) 100 MG tablet TAKE 1/2 TABLET BY MOUTH 2 TIMES A DAY 90 tablet 3  . metFORMIN (GLUCOPHAGE) 500 MG tablet TAKE 2 TABLETS BY MOUTH EVERY MORNING THEN TAKE 1 TABLET BY MOUTH EVERY EVENING 270 tablet 1  . spironolactone (ALDACTONE) 25 MG tablet Take 0.5 tablets (12.5 mg total) by mouth daily. 45 tablet 1   No current facility-administered medications for this visit.      Past Medical History:  Diagnosis Date  . CAD (coronary artery disease)   . Diabetes  mellitus type II, controlled ([place])   . Heart disease    Ablation  . History of chicken pox   . Hyperlipidemia   . Hypertension   . Umbilical hernia     Past Surgical History:  Procedure Laterality Date  . ABLATION     Rhythm problem; rhythm unknown; [place] GRAFT  2001  . EXPLORATION POST OPERATIVE OPEN HEART  2001, June  . TONSILLECTOMY    . UMBILICAL HERNIA REPAIR  2015    Social History   Socioeconomic History  . Marital status: Married    Spouse name: Not on file  . Number of children: 2  . Years of education: Not on file  . Highest education level: Not on file  Social Needs  . Financial resource strain: Not on file  . Food insecurity - worry: Not on file  . Food insecurity - inability: Not on file  . Transportation needs - medical: Not on file  . Transportation needs - non-medical: Not on file  Occupational History  . Not on file  Tobacco Use  . Smoking status: Former Research scientist (life sciences)  . Smokeless tobacco: Never Used  Substance and Sexual Activity  . Alcohol use: No    Alcohol/week: 0.0 oz  . Drug use: No  . Sexual activity: Not on file  Other Topics Concern  . Not on file  Social History Narrative  . Not on file    Family History  Problem Relation Age of Onset  . Diabetes Mother 50       Deceased  . Heart disease Father 52       Deceased  . Heart disease Maternal Uncle   . Diabetes Maternal Uncle   . Heart disease Brother   . Diabetes Brother   . Diabetes Sister        #1  . Heart disease Sister        #2  . Hyperlipidemia Son        x2    ROS: no fevers or chills, productive cough, hemoptysis, dysphasia, odynophagia, melena, hematochezia, dysuria, hematuria, rash, seizure activity, orthopnea, PND, pedal edema, claudication. Remaining systems are negative.  Physical Exam: Well-developed well-nourished in no acute distress.  Skin is warm and dry.  HEENT is normal.  Neck is supple.  Chest is clear to auscultation  with normal expansion.  Cardiovascular exam is regular rate and rhythm.  3/6 systolic murmur left sternal border.  S2 is not diminished. Abdominal exam nontender or distended. No masses palpated. Extremities show no edema. neuro grossly intact   A/P  1 coronary artery disease-patient is doing well with no chest pain.  Continue medical therapy with aspirin and statin.  2 aortic stenosis-patient has increasing dyspnea and fatigue with more vigorous activities.  His most recent echocardiogram was personally reviewed.  His mean gradient suggest moderate aortic stenosis but dimensionless index of 0.24 suggest severe.  I will arrange a transesophageal echocardiogram to better assess aortic valve morphology and hopefully reassess his gradient.  He will likely need referral for consideration of TAVR.   3 abdominal aortic aneurysm-patient will need follow-up study January 2020.  4 hypertension-blood pressure is mildly elevated.  However he has some orthostatic symptoms.  We will continue present medications and follow.  Advance as needed.  5 hyperlipidemia-continue statin.  Kirk Ruths, MD

## 2018-01-13 MED FILL — AMLODIPINE BESYLATE 10 MG T: 10 | 90 days supply | Qty: 45 | Fill #1

## 2018-01-15 ENCOUNTER — Encounter: Payer: Self-pay | Admitting: *Deleted

## 2018-01-15 ENCOUNTER — Encounter: Payer: Self-pay | Admitting: Cardiology

## 2018-01-15 ENCOUNTER — Ambulatory Visit (INDEPENDENT_AMBULATORY_CARE_PROVIDER_SITE_OTHER): Payer: Medicare Other | Admitting: Cardiology

## 2018-01-15 VITALS — BP 169/77 | HR 56 | Ht 68.0 in | Wt 177.8 lb

## 2018-01-15 DIAGNOSIS — I2510 Atherosclerotic heart disease of native coronary artery without angina pectoris: Secondary | ICD-10-CM

## 2018-01-15 DIAGNOSIS — I714 Abdominal aortic aneurysm, without rupture, unspecified: Secondary | ICD-10-CM

## 2018-01-15 DIAGNOSIS — I350 Nonrheumatic aortic (valve) stenosis: Secondary | ICD-10-CM

## 2018-01-15 DIAGNOSIS — I10 Essential (primary) hypertension: Secondary | ICD-10-CM

## 2018-01-15 DIAGNOSIS — I2583 Coronary atherosclerosis due to lipid rich plaque: Secondary | ICD-10-CM

## 2018-01-15 DIAGNOSIS — E7800 Pure hypercholesterolemia, unspecified: Secondary | ICD-10-CM

## 2018-01-15 NOTE — Patient Instructions (Signed)
Your physician has requested that you have a TEE. During a TEE, sound waves are used to create images of your heart. It provides your doctor with information about the size and shape of your heart and how well your heart's chambers and valves are working. In this test, a transducer is attached to the end of a flexible tube that's guided down your throat and into your esophagus (the tube leading from you mouth to your stomach) to get a more detailed image of your heart. You are not awake for the procedure. Please see the instruction sheet given to you today. For further information please visit [email or web address].   Your physician recommends that you schedule a follow-up appointment in: [place]

## 2018-01-17 ENCOUNTER — Ambulatory Visit (HOSPITAL_BASED_OUTPATIENT_CLINIC_OR_DEPARTMENT_OTHER): Payer: Medicare Other

## 2018-01-17 ENCOUNTER — Ambulatory Visit (HOSPITAL_COMMUNITY)
Admission: RE | Admit: 2018-01-17 | Discharge: 2018-01-17 | Disposition: A | Discharge status: Home / Self Care | Payer: Medicare Other | Source: Ambulatory Visit | Attending: Cardiology | Admitting: Cardiology

## 2018-01-17 ENCOUNTER — Other Ambulatory Visit: Payer: Self-pay

## 2018-01-17 ENCOUNTER — Encounter (HOSPITAL_COMMUNITY)
Admission: RE | Disposition: A | Discharge status: Home / Self Care | Payer: Self-pay | Source: Ambulatory Visit | Attending: Cardiology

## 2018-01-17 ENCOUNTER — Encounter (HOSPITAL_COMMUNITY): Payer: Self-pay | Admitting: Emergency Medicine

## 2018-01-17 DIAGNOSIS — Z79899 Other long term (current) drug therapy: Secondary | ICD-10-CM | POA: Diagnosis not present

## 2018-01-17 DIAGNOSIS — I350 Nonrheumatic aortic (valve) stenosis: Secondary | ICD-10-CM

## 2018-01-17 DIAGNOSIS — Z87891 Personal history of nicotine dependence: Secondary | ICD-10-CM | POA: Diagnosis not present

## 2018-01-17 DIAGNOSIS — Z951 Presence of aortocoronary bypass graft: Secondary | ICD-10-CM | POA: Diagnosis not present

## 2018-01-17 DIAGNOSIS — I2510 Atherosclerotic heart disease of native coronary artery without angina pectoris: Secondary | ICD-10-CM | POA: Diagnosis not present

## 2018-01-17 DIAGNOSIS — Z7984 Long term (current) use of oral hypoglycemic drugs: Secondary | ICD-10-CM | POA: Diagnosis not present

## 2018-01-17 DIAGNOSIS — Z833 Family history of diabetes mellitus: Secondary | ICD-10-CM | POA: Diagnosis not present

## 2018-01-17 DIAGNOSIS — Z8249 Family history of ischemic heart disease and other diseases of the circulatory system: Secondary | ICD-10-CM | POA: Diagnosis not present

## 2018-01-17 DIAGNOSIS — I080 Rheumatic disorders of both mitral and aortic valves: Secondary | ICD-10-CM | POA: Diagnosis not present

## 2018-01-17 DIAGNOSIS — E119 Type 2 diabetes mellitus without complications: Secondary | ICD-10-CM | POA: Diagnosis not present

## 2018-01-17 DIAGNOSIS — E785 Hyperlipidemia, unspecified: Secondary | ICD-10-CM | POA: Diagnosis not present

## 2018-01-17 DIAGNOSIS — Z7982 Long term (current) use of aspirin: Secondary | ICD-10-CM | POA: Insufficient documentation

## 2018-01-17 DIAGNOSIS — I10 Essential (primary) hypertension: Secondary | ICD-10-CM | POA: Diagnosis not present

## 2018-01-17 HISTORY — PX: TEE WITHOUT CARDIOVERSION: SHX5443

## 2018-01-17 LAB — GLUCOSE, CAPILLARY: Glucose-Capillary: 104 mg/dL — ABNORMAL HIGH (ref 65–99)

## 2018-01-17 SURGERY — ECHOCARDIOGRAM, TRANSESOPHAGEAL
Anesthesia: Moderate Sedation

## 2018-01-17 MED ORDER — SODIUM CHLORIDE 0.9 % IV SOLN: INTRAVENOUS | Status: DC

## 2018-01-17 MED ORDER — BUTAMBEN-TETRACAINE-BENZOCAINE 2-2-14 % EX AERO
INHALATION_SPRAY | CUTANEOUS | Status: DC | PRN
  Administered 2018-01-17: 2 via TOPICAL

## 2018-01-17 MED ORDER — MIDAZOLAM HCL 5 MG/ML IJ SOLN
INTRAMUSCULAR | Status: AC
  Filled 2018-01-17: qty 2

## 2018-01-17 MED ORDER — FENTANYL CITRATE (PF) 100 MCG/2ML IJ SOLN
INTRAMUSCULAR | Status: AC
  Filled 2018-01-17: qty 2

## 2018-01-17 MED ORDER — MIDAZOLAM HCL 10 MG/2ML IJ SOLN
INTRAMUSCULAR | Status: DC | PRN
  Administered 2018-01-17 (×2): 2 mg via INTRAVENOUS

## 2018-01-17 MED ORDER — FENTANYL CITRATE (PF) 100 MCG/2ML IJ SOLN
INTRAMUSCULAR | Status: DC | PRN
  Administered 2018-01-17: 25 ug via INTRAVENOUS

## 2018-01-17 NOTE — Interval H&P Note (Signed)
History and Physical Interval Note:  01/17/2018 11:31 AM  Peter Scott  has presented today for surgery, with the diagnosis of AORTIC STENOSIS  The various methods of treatment have been discussed with the patient and family. After consideration of risks, benefits and other options for treatment, the patient has consented to  Procedure(s): TRANSESOPHAGEAL ECHOCARDIOGRAM (TEE) (N/A) as a surgical intervention .  The patient's history has been reviewed, patient examined, no change in status, stable for surgery.  I have reviewed the patient's chart and labs.  Questions were answered to the patient's satisfaction.     Kirk Ruths

## 2018-01-17 NOTE — Progress Notes (Signed)
  Echocardiogram Echocardiogram Transesophageal has been performed.  Peter Scott Peter Scott 01/17/2018, 12:30 PM

## 2018-01-17 NOTE — CV Procedure (Signed)
    Transesophageal Echocardiogram Note  Peter Scott [id number] 1932/04/28  Procedure: Transesophageal Echocardiogram Indications: AS  Procedure Details Consent: Obtained Time Out: Verified patient identification, verified procedure, site/side was marked, verified correct patient position, special equipment/implants available, Radiology Safety Procedures followed,  medications/allergies/relevent history reviewed, required imaging and test results available.  Performed  Medications:  During this procedure the patient is administered a total of Versed 4 mg and Fentanyl 25 mcg  to achieve and maintain moderate conscious sedation.  The patient's heart rate, blood pressure, and oxygen saturation are monitored continuously during the procedure. The period of conscious sedation is 30 minutes, of which I was present face-to-face 100% of this time.  Normal LV function; severe AS; mild AI.   Complications: No apparent complications Patient did tolerate procedure well.  Kirk Ruths, MD

## 2018-01-17 NOTE — Discharge Instructions (Signed)
TEE ° °YOU HAD AN CARDIAC PROCEDURE TODAY: Refer to the procedure report and other information in the discharge instructions given to you for any specific questions about what was found during the examination. If this information does not answer your questions, please call [facility] office at [phone] to clarify.  ° °DIET: Your first meal following the procedure should be a light meal and then it is ok to progress to your normal diet. A half-sandwich or bowl of soup is an example of a good first meal. Heavy or fried foods are harder to digest and may make you feel nauseous or bloated. Drink plenty of fluids but you should avoid alcoholic beverages for 24 hours. If you had a esophageal dilation, please see attached instructions for diet.  ° °ACTIVITY: Your care partner should take you home directly after the procedure. You should plan to take it easy, moving slowly for the rest of the day. You can resume normal activity the day after the procedure however YOU SHOULD NOT DRIVE, use power tools, machinery or perform tasks that involve climbing or major physical exertion for 24 hours (because of the sedation medicines used during the test).  ° °SYMPTOMS TO REPORT IMMEDIATELY: °A cardiologist can be reached at any hour. Please call [phone] for any of the following symptoms:  °Vomiting of blood or coffee ground material  °New, significant abdominal pain  °New, significant chest pain or pain under the shoulder blades  °Painful or persistently difficult swallowing  °New shortness of breath  °Black, tarry-looking or red, bloody stools ° °FOLLOW UP:  °Please also call with any specific questions about appointments or follow up tests. ° ° °Moderate Conscious Sedation, Adult, Care After °These instructions provide you with information about caring for yourself after your procedure. Your health care provider may also give you more specific instructions. Your treatment has been planned according to current medical  practices, but problems sometimes occur. Call your health care provider if you have any problems or questions after your procedure. °What can I expect after the procedure? °After your procedure, it is common: °· To feel sleepy for several hours. °· To feel clumsy and have poor balance for several hours. °· To have poor judgment for several hours. °· To vomit if you eat too soon. ° °Follow these instructions at home: °For at least 24 hours after the procedure: ° °· Do not: °? Participate in activities where you could fall or become injured. °? Drive. °? Use heavy machinery. °? Drink alcohol. °? Take sleeping pills or medicines that cause drowsiness. °? Make important decisions or sign legal documents. °? Take care of children on your own. °· Rest. °Eating and drinking °· Follow the diet recommended by your health care provider. °· If you vomit: °? Drink water, juice, or soup when you can drink without vomiting. °? Make sure you have little or no nausea before eating solid foods. °General instructions °· Have a responsible adult stay with you until you are awake and alert. °· Take over-the-counter and prescription medicines only as told by your health care provider. °· If you smoke, do not smoke without supervision. °· Keep all follow-up visits as told by your health care provider. This is important. °Contact a health care provider if: °· You keep feeling nauseous or you keep vomiting. °· You feel light-headed. °· You develop a rash. °· You have a fever. °Get help right away if: °· You have trouble breathing. °This information is not intended to replace advice   given to you by your health care provider. Make sure you discuss any questions you have with your health care provider. °Document Released: 08/26/2013 Document Revised: 04/09/2016 Document Reviewed: 02/25/2016 °Elsevier Interactive Patient Education © 2018 [employer] ° °

## 2018-01-20 MED FILL — metFORMIN HCL 500 MG TABS: 500 | 90 days supply | Qty: 270 | Fill #1

## 2018-01-24 ENCOUNTER — Encounter: Payer: Self-pay | Admitting: Cardiovascular Disease

## 2018-01-27 ENCOUNTER — Encounter: Payer: Self-pay | Admitting: Cardiovascular Disease

## 2018-01-27 ENCOUNTER — Ambulatory Visit (INDEPENDENT_AMBULATORY_CARE_PROVIDER_SITE_OTHER): Payer: Medicare Other | Admitting: Cardiovascular Disease

## 2018-01-27 VITALS — BP 126/62 | HR 52 | Ht 68.0 in | Wt 175.0 lb

## 2018-01-27 DIAGNOSIS — I2510 Atherosclerotic heart disease of native coronary artery without angina pectoris: Secondary | ICD-10-CM

## 2018-01-27 DIAGNOSIS — I2583 Coronary atherosclerosis due to lipid rich plaque: Secondary | ICD-10-CM

## 2018-01-27 DIAGNOSIS — I350 Nonrheumatic aortic (valve) stenosis: Secondary | ICD-10-CM | POA: Diagnosis not present

## 2018-01-27 LAB — CBC
Hematocrit: 41 % (ref 37.5–51.0)
Hemoglobin: 14 g/dL (ref 13.0–17.7)
MCH: 32.7 pg (ref 26.6–33.0)
MCHC: 34.1 g/dL (ref 31.5–35.7)
MCV: 96 fL (ref 79–97)
Platelets: 196 10*3/uL (ref 150–379)
RBC: 4.28 x10E6/uL (ref 4.14–5.80)
RDW: 13.4 % (ref 12.3–15.4)
WBC: 8.3 10*3/uL (ref 3.4–10.8)

## 2018-01-27 LAB — BASIC METABOLIC PANEL
BUN / CREAT RATIO: 19 (ref 10–24)
BUN: 24 mg/dL (ref 8–27)
CO2: 25 mmol/L (ref 20–29)
CREATININE: 1.29 mg/dL — AB (ref 0.76–1.27)
Calcium: 9.7 mg/dL (ref 8.6–10.2)
Chloride: 100 mmol/L (ref 96–106)
GFR calc Af Amer: 59 mL/min/{1.73_m2} — ABNORMAL LOW (ref >59)
GFR, EST NON AFRICAN AMERICAN: 51 mL/min/{1.73_m2} — AB (ref >59)
Glucose: 205 mg/dL — ABNORMAL HIGH (ref 65–99)
POTASSIUM: 4.7 mmol/L (ref 3.5–5.2)
SODIUM: 138 mmol/L (ref 134–144)

## 2018-01-27 LAB — PROTIME-INR
INR: 1 (ref 0.8–1.2)
Prothrombin Time: 10.4 s (ref 9.1–12.0)

## 2018-01-27 NOTE — Patient Instructions (Addendum)
Medication Instructions:  Your physician recommends that you continue on your current medications as directed. Please refer to the Current Medication list given to you today.  Labwork: BMET, CBC and INR today  Testing/Procedures: Your physician has requested that you have a cardiac catheterization. Cardiac catheterization is used to diagnose and/or treat various heart conditions. Doctors may recommend this procedure for a number of different reasons. The most common reason is to evaluate chest pain. Chest pain can be a symptom of coronary artery disease (CAD), and cardiac catheterization can show whether plaque is narrowing or blocking your heart's arteries. This procedure is also used to evaluate the valves, as well as measure the blood flow and oxygen levels in different parts of your heart. For further information please visit [email or web address]. Please follow instruction sheet, as given.   Follow-Up: Peter Quay, RN will contact you with your follow up appointments.  Any Other Special Instructions Will Be Listed Below (If Applicable).     [place] [facility] [address], [address] [place] [address] Dept: [phone] Loc: [phone]  Peter Scott  01/27/2018  You are scheduled for a Cardiac Catheterization on Friday, March 15 with Dr. Lauree Chandler.  1. Please arrive at the [facility] (Main Entrance A) at [facility]: [address] [place], [place] [address] at 5:30 AM (two hours before your procedure to ensure your preparation). Free valet parking service is available.   Special note: Every effort is made to have your procedure done on time. Please understand that emergencies sometimes delay scheduled procedures.  2. Diet: Do not eat or drink anything after midnight prior to your procedure except sips of water to take medications.  3. Labs: You will have labs drawn  today  4. Medication instructions in preparation for your procedure:  Stop taking, Glucophage (Metformin) on Thursday, March 14.  You will hold your Metformin 24 hours before and 48 hours after your procedure.  Hold your Glyburide the night before and the morning of your procedure.  Hold your Spironolactone the morning of your procedure.     On the morning of your procedure, take your Aspirin and any morning medicines NOT listed above.  You may use sips of water.  5. Plan for one night stay--bring personal belongings. 6. Bring a current list of your medications and current insurance cards. 7. You MUST have a responsible person to drive you home. 8. Someone MUST be with you the first 24 hours after you arrive home or your discharge will be delayed. 9. Please wear clothes that are easy to get on and off and wear slip-on shoes.  Thank you for allowing [place] to care for you!   -- [facility]   If you need a refill on your cardiac medications before your next appointment, please call your pharmacy.

## 2018-01-27 NOTE — Progress Notes (Signed)
Valve Clinic Consult Note  Chief Complaint  Patient presents with  . New Patient (Initial Visit)    Severe aortic stenosis   History of Present Illness: 84 yo male with history of aortic stenosis, CAD s/p 4V CABG in 2001, post-op atrial fibrillation s/p ablation, DM, HLD, HTN who is here today as a new consult for the evaluation of severe aortic stenosis, referred by Dr. [name]. He has been followed for aortic stenosis for years. He moved to [place] in 2015 to be near his children. Echo in November 2018 showed normal LV systolic function with grade 2 diastolic dysfunction, moderate to severe AS with mean gradient of 29 mmHg and DVI of 0.23, mild AI, mild MR. He is known to have CAD and underwent 4V CABG in [facility] in [place], [place] in 2001. He has had no cardiac cath since then. He is known to have mild carotid artery disease and a 3.9 cm abdominal aortic aneurysm by u/s in January 2019. He has had recent worsened fatigue and dyspnea with exertion. Dr. [name] arranged a TEE on 01/17/18 and this showed severe aortic stenosis with thickened aortic valve leaflets, AVA 0.66 cm2, DVI 0.26.   He tells me that he continues to have fatigue and dyspnea with exertion. Over the last week, he has had heaviness in his chest. He also has occasional dizziness but no near syncope or syncope. He is a retired Chief Financial Officer. He lives with his wife in [place]. He is very active.   Primary Care Physician: [name], MD Primary Cardiologist: Kirk Ruths Referring Cardiologist: Kirk Ruths  Past Medical History:  Diagnosis Date  . Aortic aneurysm ([place])   . Aortic stenosis   . CAD (coronary artery disease)   . Diabetes mellitus type II, controlled ([place])   . Heart disease    Ablation  . History of chicken pox   . Hyperlipidemia   . Hypertension   . Umbilical hernia     Past Surgical History:  Procedure Laterality Date  . ABLATION     Rhythm problem; rhythm unknown; [place] GRAFT  2001  . EXPLORATION POST OPERATIVE OPEN HEART  2001, June  . TEE WITHOUT CARDIOVERSION N/A 01/17/2018   Procedure: TRANSESOPHAGEAL ECHOCARDIOGRAM (TEE);  Surgeon: Lelon Perla, MD;  Location: [facility] ENDOSCOPY;  Service: Cardiovascular;  Laterality: N/A;  . TONSILLECTOMY    . UMBILICAL HERNIA REPAIR  2015    Current Outpatient Medications  Medication Sig Dispense Refill  . amLODipine (NORVASC) 10 MG tablet TAKE 1/2 TABLET BY MOUTH DAILY 45 tablet 1  . aspirin 81 MG tablet Take 81 mg by mouth daily.    Marland Kitchen atorvastatin (LIPITOR) 80 MG tablet Take 0.5 tablets (40 mg total) by mouth at bedtime. 45 tablet 1  . glucose blood (BAYER CONTOUR TEST) test strip TEST ONCE DAILY FOR DIABETES Dx Code: E11.9 100 each 11  . glyBURIDE (DIABETA) 5 MG tablet TAKE 1/2 (ONE-HALF) TABLET BY MOUTH TWICE DAILY WITH MEALS 90 tablet 1  . losartan (COZAAR) 100 MG tablet TAKE 1/2 TABLET BY MOUTH 2 TIMES A DAY 90 tablet 3  . metFORMIN (GLUCOPHAGE) 500 MG tablet TAKE 2 TABLETS BY MOUTH EVERY MORNING THEN TAKE 1 TABLET BY MOUTH EVERY EVENING 270 tablet 1  . Multiple Vitamins-Minerals (MULTIVITAMIN WITH MINERALS) tablet Take 1 tablet by mouth daily.    Marland Kitchen spironolactone (ALDACTONE) 25 MG tablet Take 0.5 tablets (12.5 mg total) by mouth daily. 45 tablet 1  No current facility-administered medications for this visit.     No Known Allergies  Social History   Socioeconomic History  . Marital status: Married    Spouse name: Not on file  . Number of children: 2  . Years of education: Not on file  . Highest education level: Not on file  Social Needs  . Financial resource strain: Not on file  . Food insecurity - worry: Not on file  . Food insecurity - inability: Not on file  . Transportation needs - medical: Not on file  . Transportation needs - non-medical: Not on file  Occupational History  . Not on file  Tobacco Use  . Smoking status: Former Smoker    Packs/day: 1.00    Years: 3.00      Pack years: 3.00  . Smokeless tobacco: Never Used  Substance and Sexual Activity  . Alcohol use: No    Alcohol/week: 0.0 oz  . Drug use: No  . Sexual activity: Not on file  Other Topics Concern  . Not on file  Social History Narrative  . Not on file    Family History  Problem Relation Age of Onset  . Diabetes Mother 55       Deceased  . Heart disease Father 20       Deceased  . Heart disease Maternal Uncle   . Diabetes Maternal Uncle   . Heart disease Brother   . Diabetes Brother   . Diabetes Sister        #1  . Heart disease Sister        #2  . Hyperlipidemia Son        x2    Review of Systems:  As stated in the HPI and otherwise negative.   BP 126/62   Pulse (!) 52   Ht 5\' 8"  (1.727 m)   Wt 175 lb (79.4 kg)   SpO2 91%   BMI 26.61 kg/m   Physical Examination: General: Well developed, well nourished, NAD  HEENT: OP clear, mucus membranes moist  SKIN: warm, dry. No rashes. Neuro: No focal deficits  Musculoskeletal: Muscle strength 5/5 all ext  Psychiatric: Mood and affect normal  Neck: No JVD, no carotid bruits, no thyromegaly, no lymphadenopathy.  Lungs:Clear bilaterally, no wheezes, rhonci, crackles Cardiovascular: Regular rate and rhythm. Loud, harsh systolic murmur.   Abdomen:Soft. Bowel sounds present. Non-tender.  Extremities: No lower extremity edema. Pulses are 2 + in the bilateral DP/PT.  EKG:  EKG is not ordered today. The ekg ordered today demonstrates   Echo 10/09/17: - Left ventricle: The cavity size was normal. Wall thickness was   increased in a pattern of moderate LVH. There was moderate   concentric hypertrophy. Systolic function was normal. The   estimated ejection fraction was in the range of 55% to 60%. Wall   motion was normal; there were no regional wall motion   abnormalities. Features are consistent with a pseudonormal left   ventricular filling pattern, with concomitant abnormal relaxation   and increased filling pressure  (grade 2 diastolic dysfunction).   Doppler parameters are consistent with high ventricular filling   pressure. - Aortic valve: Cusp separation was severely reduced. Valve   mobility was restricted. There was moderate to severe stenosis.   There was mild regurgitation. Valve area (VTI): 0.82 cm^2. Valve   area (Vmax): 0.77 cm^2. Valve area (Vmean): 0.82 cm^2. - Mitral valve: There was mild regurgitation. - Left atrium: The atrium was mildly dilated.  Left ventricle:  Well visualized. The cavity size was normal. Wall thickness was increased in a pattern of moderate LVH. There was moderate concentric hypertrophy. Systolic function was normal. The estimated ejection fraction was in the range of 55% to 60%. Wall motion was normal; there were no regional wall motion abnormalities. The ratio of early ventricular filling to atrial contraction velocities was within the normal range. Features are consistent with a pseudonormal left ventricular filling pattern, with concomitant abnormal relaxation and increased filling pressure (grade 2 diastolic dysfunction). Doppler parameters are consistent with high ventricular filling pressure.  ------------------------------------------------------------------- Aortic valve:   Trileaflet; moderately thickened, moderately calcified leaflets. Cusp separation was severely reduced. Valve mobility was restricted.  Doppler:   There was moderate to severe stenosis.   There was mild regurgitation.    VTI ratio of LVOT to aortic valve: 0.26. Valve area (VTI): 0.82 cm^2. Indexed valve area (VTI): 0.41 cm^2/m^2. Peak velocity ratio of LVOT to aortic valve: 0.24. Valve area (Vmax): 0.77 cm^2. Indexed valve area (Vmax): 0.39 cm^2/m^2. Mean velocity ratio of LVOT to aortic valve: 0.26. Valve area (Vmean): 0.82 cm^2. Indexed valve area (Vmean): 0.41 cm^2/m^2.    Mean gradient (S): 29 mm Hg. Peak gradient (S): 50 mm  Hg.  ------------------------------------------------------------------- Aorta:  The aorta was normal, not dilated, and non-diseased. Aortic root: The aortic root was normal in size.  ------------------------------------------------------------------- Mitral valve:   Structurally normal valve.   Leaflet separation was normal. Mobility was not restricted.  Doppler:  Transvalvular velocity was within the normal range. There was no evidence for stenosis. There was mild regurgitation.    Peak gradient (D): 3 mm Hg.  ------------------------------------------------------------------- Left atrium:  The atrium was mildly dilated.  ------------------------------------------------------------------- Atrial septum:  The septum was normal.  ------------------------------------------------------------------- Pulmonary veins:  Not visualized.  ------------------------------------------------------------------- Right ventricle:  The cavity size was normal. Wall thickness was normal. Systolic function was normal.  ------------------------------------------------------------------- Pulmonic valve:    Structurally normal valve.   Cusp separation was normal.  Doppler:  Transvalvular velocity was within the normal range. There was no evidence for stenosis. There was no regurgitation.  ------------------------------------------------------------------- Tricuspid valve:   Structurally normal valve.    Doppler: Transvalvular velocity was within the normal range. There was mild regurgitation.  ------------------------------------------------------------------- Pulmonary artery:   The main pulmonary artery was normal-sized. Systolic pressure was within the normal range.  ------------------------------------------------------------------- Right atrium:  The atrium was normal in size.  ------------------------------------------------------------------- Pericardium:  The pericardium was  normal in appearance. There was no pericardial effusion.  ------------------------------------------------------------------- Systemic veins: Inferior vena cava: Not visualized.  ------------------------------------------------------------------- Post procedure conclusions Ascending Aorta:  - The aorta was normal, not dilated, and non-diseased.  ------------------------------------------------------------------- Measurements   Left ventricle                           Value          Reference  LV ID, ED, PLAX chordal           (L)    38.9  mm       43 - 52  LV ID, ES, PLAX chordal                  29.7  mm       23 - 38  LV fx shortening, PLAX chordal    (L)    24    %        >=29  LV PW thickness, ED  14.4  mm       ----------  IVS/LV PW ratio, ED                      0.97           <=1.3  Stroke volume, 2D                        73    ml       ----------  Stroke volume/bsa, 2D                    37    ml/m^2   ----------  LV e&', lateral                           5.55  cm/s     ----------  LV E/e&', lateral                         16             ----------  LV e&', medial                            5.33  cm/s     ----------  LV E/e&', medial                          16.66          ----------  LV e&', average                           5.44  cm/s     ----------  LV E/e&', average                         16.32          ----------  Longitudinal strain, TDI                 17    %        ----------    Ventricular septum                       Value          Reference  IVS thickness, ED                        13.9  mm       ----------    LVOT                                     Value          Reference  LVOT ID, S                               20    mm       ----------  LVOT area                                3.14  cm^2     ----------  LVOT peak velocity, S                    86.5  cm/s     ----------  LVOT mean velocity, S                    65.8  cm/s      ----------  LVOT VTI, S                              23.3  cm       ----------    Aortic valve                             Value          Reference  Aortic valve peak velocity, S            355   cm/s     ----------  Aortic valve mean velocity, S            252   cm/s     ----------  Aortic valve VTI, S                      88.7  cm       ----------  Aortic mean gradient, S                  29    mm Hg    ----------  Aortic peak gradient, S                  50    mm Hg    ----------  VTI ratio, LVOT/AV                       0.26           ----------  Aortic valve area, VTI                   0.82  cm^2     ----------  Aortic valve area/bsa, VTI               0.41  cm^2/m^2 ----------  Velocity ratio, peak, LVOT/AV            0.24           ----------  Aortic valve area, peak velocity         0.77  cm^2     ----------  Aortic valve area/bsa, peak              0.39  cm^2/m^2 ----------  velocity  Velocity ratio, mean, LVOT/AV            0.26           ----------  Aortic valve area, mean velocity         0.82  cm^2     ----------  Aortic valve area/bsa, mean              0.41  cm^2/m^2 ----------  velocity  Aortic regurg pressure half-time         547   ms       ----------    Aorta  Value          Reference  Aortic root ID, ED                       32    mm       ----------  Ascending aorta ID, A-P, S               31    mm       ----------    Left atrium                              Value          Reference  LA ID, A-P, ES                           45    mm       ----------  LA ID/bsa, A-P                    (H)    2.26  cm/m^2   <=2.2  LA volume, S                             77.8  ml       ----------  LA volume/bsa, S                         39.1  ml/m^2   ----------  LA volume, ES, 1-p A4C                   57    ml       ----------  LA volume/bsa, ES, 1-p A4C               28.7  ml/m^2   ----------  LA volume, ES, 1-p A2C                   96.3  ml        ----------  LA volume/bsa, ES, 1-p A2C               48.4  ml/m^2   ----------    Mitral valve                             Value          Reference  Mitral E-wave peak velocity              88.8  cm/s     ----------  Mitral A-wave peak velocity              68.9  cm/s     ----------  Mitral deceleration time                 194   ms       150 - 230  Mitral peak gradient, D                  3     mm Hg    ----------  Mitral E/A ratio, peak                   1.3            ----------  Tricuspid valve                          Value          Reference  Tricuspid regurg peak velocity           250   cm/s     ----------  Tricuspid peak RV-RA gradient            25    mm Hg    ----------    Right atrium                             Value          Reference  RA ID, S-I, ES, A4C                      47.8  mm       34 - 49  RA area, ES, A4C                         15.2  cm^2     8.3 - 19.5  RA volume, ES, A/L                       36.2  ml       ----------  RA volume/bsa, ES, A/L                   18.2  ml/m^2   ----------  TEE 01/17/18: Left ventricle: Systolic function was normal. The estimated   ejection fraction was in the range of 50% to 55%. Wall motion was   normal; there were no regional wall motion abnormalities. - Aortic valve: Cusp separation was reduced. There was moderate to   severe stenosis. There was mild regurgitation. Valve area (VTI):   0.73 cm^2. Valve area (Vmax): 0.69 cm^2. Valve area (Vmean): 0.66   cm^2. - Mitral valve: No evidence of vegetation. There was mild   regurgitation. - Left atrium: The atrium was mildly dilated. No evidence of   thrombus in the atrial cavity or appendage. - Right atrium: The atrium was mildly dilated. - Atrial septum: No defect or patent foramen ovale was identified. - Tricuspid valve: No evidence of vegetation. - Pulmonic valve: No evidence of vegetation.  Impressions:  - Low normal LV systolic function; calcified aortic valve  with   fixed left and right coronary cusps; moderate to severe AS (mean   gradient 28 mmHg; AVA 0.85 cm2 by planimetry; dimensionless index   .26); mild AI; mild MR; mild biatrial enlargement; mild TR.  ------------------------------------------------------------------- Study data:   Study status:  Routine.  Consent:  The risks, benefits, and alternatives to the procedure were explained to the patient and informed consent was obtained.  Procedure:  Initial setup. The patient was brought to the laboratory. Surface ECG leads were monitored. Sedation. Conscious sedation was administered. Transesophageal echocardiography. Topical anesthesia was obtained using viscous lidocaine. An adult multiplane transesophageal probe was inserted by the attending cardiologist. Image quality was adequate.  Study completion:  The patient tolerated the procedure well. There were no complications.          Diagnostic transesophageal echocardiography.  2D and color Doppler. Birthdate:  Patient birthdate: [birth year].  Age:  Patient is 84 yr old.  Sex:  Gender: male.    BMI: 27 kg/m^2.  Blood pressure: 167/77  Patient status:  Outpatient.  Study date:  Study date: 01/17/2018. Study time: 11:20 AM.  Location:  Endoscopy.  -------------------------------------------------------------------  ------------------------------------------------------------------- Left ventricle:  Systolic function was normal. The estimated ejection fraction was in the range of 50% to 55%. Wall motion was normal; there were no regional wall motion abnormalities.  ------------------------------------------------------------------- Aortic valve:   Trileaflet; severely calcified leaflets. Cusp separation was reduced.  Doppler:   There was moderate to severe stenosis.   There was mild regurgitation.    VTI ratio of LVOT to aortic valve: 0.23. Valve area (VTI): 0.73 cm^2. Indexed valve area (VTI): 0.37 cm^2/m^2. Peak velocity ratio of  LVOT to aortic valve: 0.22. Valve area (Vmax): 0.69 cm^2. Indexed valve area (Vmax): 0.35 cm^2/m^2. Mean velocity ratio of LVOT to aortic valve: 0.21. Valve area (Vmean): 0.66 cm^2. Indexed valve area (Vmean): 0.33 cm^2/m^2.    Mean gradient (S): 26 mm Hg. Peak gradient (S): 47 mm Hg.  ------------------------------------------------------------------- Aorta:  Descending aorta: The descending aorta had moderate diffuse disease.  ------------------------------------------------------------------- Mitral valve:   Structurally normal valve.   Leaflet separation was normal.  No evidence of vegetation.  Doppler:  There was mild regurgitation.  ------------------------------------------------------------------- Left atrium:  The atrium was mildly dilated.  No evidence of thrombus in the atrial cavity or appendage.  ------------------------------------------------------------------- Atrial septum:  No defect or patent foramen ovale was identified.   ------------------------------------------------------------------- Right ventricle:  The cavity size was normal. Systolic function was normal.  ------------------------------------------------------------------- Pulmonic valve:    Structurally normal valve.   Cusp separation was normal.  No evidence of vegetation.  ------------------------------------------------------------------- Tricuspid valve:   Structurally normal valve.   Leaflet separation was normal.  No evidence of vegetation.  Doppler:  There was mild regurgitation.  ------------------------------------------------------------------- Right atrium:  The atrium was mildly dilated.  ------------------------------------------------------------------- Pericardium:  There was no pericardial effusion.  ------------------------------------------------------------------- Measurements   Left ventricle                           Value  Stroke volume, 2D                         60    ml  Stroke volume/bsa, 2D                    30    ml/m^2    LVOT                                     Value  LVOT ID, S                               20    mm  LVOT area                                3.14  cm^2  LVOT peak velocity, S                    75.1  cm/s  LVOT mean velocity, S                    50.9  cm/s  LVOT VTI, S  19.2  cm  LVOT peak gradient, S                    2     mm Hg    Aortic valve                             Value  Aortic valve peak velocity, S            341   cm/s  Aortic valve mean velocity, S            241   cm/s  Aortic valve VTI, S                      82.5  cm  Aortic mean gradient, S                  26    mm Hg  Aortic peak gradient, S                  47    mm Hg  VTI ratio, LVOT/AV                       0.23  Aortic valve area, VTI                   0.73  cm^2  Aortic valve area/bsa, VTI               0.37  cm^2/m^2  Velocity ratio, peak, LVOT/AV            0.22  Aortic valve area, peak velocity         0.69  cm^2  Aortic valve area/bsa, peak velocity     0.35  cm^2/m^2  Velocity ratio, mean, LVOT/AV            0.21  Aortic valve area, mean velocity         0.66  cm^2  Aortic valve area/bsa, mean velocity     0.33  cm^2/m^2  Recent Labs: 09/23/2017: ALT 19; BUN 23; Creatinine, Ser 1.22; Hemoglobin 14.3; Platelets 182.0 R; Potassium 4.5; Sodium 137     Wt Readings from Last 3 Encounters:  01/27/18 175 lb (79.4 kg)  01/17/18 177 lb (80.3 kg)  01/15/18 177 lb 12.8 oz (80.6 kg)     Other studies Reviewed: Additional studies/ records that were reviewed today include: Echo images, office notes.. Review of the above records demonstrates:    Assessment and Plan:   1. Severe aortic stenosis: He has severe, stage D aortic valve stenosis. I have personally reviewed the echo images. The aortic valve is thickened, calcified with limited leaflet mobility. I think he would benefit from AVR. Given advanced age, He  is not a good candidate for conventional AVR by surgical approach. I think he may be a good candidate for TAVR.   STS Risk Score:  Risk of Mortality:  3.243% Renal Failure:  3.894% Permanent Stroke:  2.742% Prolonged Ventilation:  11.504% DSW Infection:  0.151% Reoperation:  4.460% Morbidity or Mortality:  16.819% Short Length of Stay:  23.937% Long Length of Stay:  8.289%  I have reviewed the natural history of aortic stenosis with the patient and their family members  who are present today. We have discussed the limitations of medical therapy and the poor prognosis associated with symptomatic aortic stenosis. We have reviewed potential treatment  options, including palliative medical therapy, conventional surgical aortic valve replacement, and transcatheter aortic valve replacement. We discussed treatment options in the context of the patient's specific comorbid medical conditions.    He would like to proceed with planning for TAVR. I will arrange a right and left heart catheterization at [facility] 01/31/18. Risks and benefits of the cath procedure and the TAVR procedure are reviewed with the patient today. After the cath, he  will have a cardiac CT, CTA of the chest/abdomen and pelvis, carotid dopplers, PFTs and will then be referred to see one of the CT surgeons on our TAVR team.    Current medicines are reviewed at length with the patient today.  The patient does not have concerns regarding medicines.  The following changes have been made:  No changes  Labs/ tests ordered today include:   Orders Placed This Encounter  Procedures  . Basic metabolic panel  . CBC  . INR/PT     Disposition:   FU with the TAVR team post cath.    Signed, Lauree Chandler, MD 01/27/2018 11:42 AM    [facility] [place], [place], [place]  [address] Phone: [phone]; Fax: [phone]

## 2018-01-27 NOTE — H&P (View-Only) (Signed)
Valve Clinic Consult Note  Chief Complaint  Patient presents with  . New Patient (Initial Visit)    Severe aortic stenosis   History of Present Illness: 84 yo male with history of aortic stenosis, CAD s/p 4V CABG in 2001, post-op atrial fibrillation s/p ablation, DM, HLD, HTN who is here today as a new consult for the evaluation of severe aortic stenosis, referred by Dr. [name]. He has been followed for aortic stenosis for years. He moved to [place] in 2015 to be near his children. Echo in November 2018 showed normal LV systolic function with grade 2 diastolic dysfunction, moderate to severe AS with mean gradient of 29 mmHg and DVI of 0.23, mild AI, mild MR. He is known to have CAD and underwent 4V CABG in [facility] in [place], [place] in 2001. He has had no cardiac cath since then. He is known to have mild carotid artery disease and a 3.9 cm abdominal aortic aneurysm by u/s in January 2019. He has had recent worsened fatigue and dyspnea with exertion. Dr. [name] arranged a TEE on 01/17/18 and this showed severe aortic stenosis with thickened aortic valve leaflets, AVA 0.66 cm2, DVI 0.26.   He tells me that he continues to have fatigue and dyspnea with exertion. Over the last week, he has had heaviness in his chest. He also has occasional dizziness but no near syncope or syncope. He is a retired Chief Financial Officer. He lives with his wife in [place]. He is very active.   Primary Care Physician: [name], MD Primary Cardiologist: Kirk Ruths Referring Cardiologist: Kirk Ruths  Past Medical History:  Diagnosis Date  . Aortic aneurysm ([place])   . Aortic stenosis   . CAD (coronary artery disease)   . Diabetes mellitus type II, controlled ([place])   . Heart disease    Ablation  . History of chicken pox   . Hyperlipidemia   . Hypertension   . Umbilical hernia     Past Surgical History:  Procedure Laterality Date  . ABLATION     Rhythm problem; rhythm unknown; [place] GRAFT  2001  . EXPLORATION POST OPERATIVE OPEN HEART  2001, June  . TEE WITHOUT CARDIOVERSION N/A 01/17/2018   Procedure: TRANSESOPHAGEAL ECHOCARDIOGRAM (TEE);  Surgeon: Lelon Perla, MD;  Location[facility] ENDOSCOPY;  Service: Cardiovascular;  Laterality: N/A;  . TONSILLECTOMY    . UMBILICAL HERNIA REPAIR  2015    Current Outpatient Medications  Medication Sig Dispense Refill  . amLODipine (NORVASC) 10 MG tablet TAKE 1/2 TABLET BY MOUTH DAILY 45 tablet 1  . aspirin 81 MG tablet Take 81 mg by mouth daily.    Marland Kitchen atorvastatin (LIPITOR) 80 MG tablet Take 0.5 tablets (40 mg total) by mouth at bedtime. 45 tablet 1  . glucose blood (BAYER CONTOUR TEST) test strip TEST ONCE DAILY FOR DIABETES Dx Code: E11.9 100 each 11  . glyBURIDE (DIABETA) 5 MG tablet TAKE 1/2 (ONE-HALF) TABLET BY MOUTH TWICE DAILY WITH MEALS 90 tablet 1  . losartan (COZAAR) 100 MG tablet TAKE 1/2 TABLET BY MOUTH 2 TIMES A DAY 90 tablet 3  . metFORMIN (GLUCOPHAGE) 500 MG tablet TAKE 2 TABLETS BY MOUTH EVERY MORNING THEN TAKE 1 TABLET BY MOUTH EVERY EVENING 270 tablet 1  . Multiple Vitamins-Minerals (MULTIVITAMIN WITH MINERALS) tablet Take 1 tablet by mouth daily.    Marland Kitchen spironolactone (ALDACTONE) 25 MG tablet Take 0.5 tablets (12.5 mg total) by mouth daily. 45 tablet 1  No current facility-administered medications for this visit.     No Known Allergies  Social History   Socioeconomic History  . Marital status: Married    Spouse name: Not on file  . Number of children: 2  . Years of education: Not on file  . Highest education level: Not on file  Social Needs  . Financial resource strain: Not on file  . Food insecurity - worry: Not on file  . Food insecurity - inability: Not on file  . Transportation needs - medical: Not on file  . Transportation needs - non-medical: Not on file  Occupational History  . Not on file  Tobacco Use  . Smoking status: Former Smoker    Packs/day: 1.00    Years: 3.00      Pack years: 3.00  . Smokeless tobacco: Never Used  Substance and Sexual Activity  . Alcohol use: No    Alcohol/week: 0.0 oz  . Drug use: No  . Sexual activity: Not on file  Other Topics Concern  . Not on file  Social History Narrative  . Not on file    Family History  Problem Relation Age of Onset  . Diabetes Mother 9       Deceased  . Heart disease Father 26       Deceased  . Heart disease Maternal Uncle   . Diabetes Maternal Uncle   . Heart disease Brother   . Diabetes Brother   . Diabetes Sister        #1  . Heart disease Sister        #2  . Hyperlipidemia Son        x2    Review of Systems:  As stated in the HPI and otherwise negative.   BP 126/62   Pulse (!) 52   Ht 5\' 8"  (1.727 m)   Wt 175 lb (79.4 kg)   SpO2 91%   BMI 26.61 kg/m   Physical Examination: General: Well developed, well nourished, NAD  HEENT: OP clear, mucus membranes moist  SKIN: warm, dry. No rashes. Neuro: No focal deficits  Musculoskeletal: Muscle strength 5/5 all ext  Psychiatric: Mood and affect normal  Neck: No JVD, no carotid bruits, no thyromegaly, no lymphadenopathy.  Lungs:Clear bilaterally, no wheezes, rhonci, crackles Cardiovascular: Regular rate and rhythm. Loud, harsh systolic murmur.   Abdomen:Soft. Bowel sounds present. Non-tender.  Extremities: No lower extremity edema. Pulses are 2 + in the bilateral DP/PT.  EKG:  EKG is not ordered today. The ekg ordered today demonstrates   Echo 10/09/17: - Left ventricle: The cavity size was normal. Wall thickness was   increased in a pattern of moderate LVH. There was moderate   concentric hypertrophy. Systolic function was normal. The   estimated ejection fraction was in the range of 55% to 60%. Wall   motion was normal; there were no regional wall motion   abnormalities. Features are consistent with a pseudonormal left   ventricular filling pattern, with concomitant abnormal relaxation   and increased filling pressure  (grade 2 diastolic dysfunction).   Doppler parameters are consistent with high ventricular filling   pressure. - Aortic valve: Cusp separation was severely reduced. Valve   mobility was restricted. There was moderate to severe stenosis.   There was mild regurgitation. Valve area (VTI): 0.82 cm^2. Valve   area (Vmax): 0.77 cm^2. Valve area (Vmean): 0.82 cm^2. - Mitral valve: There was mild regurgitation. - Left atrium: The atrium was mildly dilated.  Left ventricle:  Well visualized. The cavity size was normal. Wall thickness was increased in a pattern of moderate LVH. There was moderate concentric hypertrophy. Systolic function was normal. The estimated ejection fraction was in the range of 55% to 60%. Wall motion was normal; there were no regional wall motion abnormalities. The ratio of early ventricular filling to atrial contraction velocities was within the normal range. Features are consistent with a pseudonormal left ventricular filling pattern, with concomitant abnormal relaxation and increased filling pressure (grade 2 diastolic dysfunction). Doppler parameters are consistent with high ventricular filling pressure.  ------------------------------------------------------------------- Aortic valve:   Trileaflet; moderately thickened, moderately calcified leaflets. Cusp separation was severely reduced. Valve mobility was restricted.  Doppler:   There was moderate to severe stenosis.   There was mild regurgitation.    VTI ratio of LVOT to aortic valve: 0.26. Valve area (VTI): 0.82 cm^2. Indexed valve area (VTI): 0.41 cm^2/m^2. Peak velocity ratio of LVOT to aortic valve: 0.24. Valve area (Vmax): 0.77 cm^2. Indexed valve area (Vmax): 0.39 cm^2/m^2. Mean velocity ratio of LVOT to aortic valve: 0.26. Valve area (Vmean): 0.82 cm^2. Indexed valve area (Vmean): 0.41 cm^2/m^2.    Mean gradient (S): 29 mm Hg. Peak gradient (S): 50 mm  Hg.  ------------------------------------------------------------------- Aorta:  The aorta was normal, not dilated, and non-diseased. Aortic root: The aortic root was normal in size.  ------------------------------------------------------------------- Mitral valve:   Structurally normal valve.   Leaflet separation was normal. Mobility was not restricted.  Doppler:  Transvalvular velocity was within the normal range. There was no evidence for stenosis. There was mild regurgitation.    Peak gradient (D): 3 mm Hg.  ------------------------------------------------------------------- Left atrium:  The atrium was mildly dilated.  ------------------------------------------------------------------- Atrial septum:  The septum was normal.  ------------------------------------------------------------------- Pulmonary veins:  Not visualized.  ------------------------------------------------------------------- Right ventricle:  The cavity size was normal. Wall thickness was normal. Systolic function was normal.  ------------------------------------------------------------------- Pulmonic valve:    Structurally normal valve.   Cusp separation was normal.  Doppler:  Transvalvular velocity was within the normal range. There was no evidence for stenosis. There was no regurgitation.  ------------------------------------------------------------------- Tricuspid valve:   Structurally normal valve.    Doppler: Transvalvular velocity was within the normal range. There was mild regurgitation.  ------------------------------------------------------------------- Pulmonary artery:   The main pulmonary artery was normal-sized. Systolic pressure was within the normal range.  ------------------------------------------------------------------- Right atrium:  The atrium was normal in size.  ------------------------------------------------------------------- Pericardium:  The pericardium was  normal in appearance. There was no pericardial effusion.  ------------------------------------------------------------------- Systemic veins: Inferior vena cava: Not visualized.  ------------------------------------------------------------------- Post procedure conclusions Ascending Aorta:  - The aorta was normal, not dilated, and non-diseased.  ------------------------------------------------------------------- Measurements   Left ventricle                           Value          Reference  LV ID, ED, PLAX chordal           (L)    38.9  mm       43 - 52  LV ID, ES, PLAX chordal                  29.7  mm       23 - 38  LV fx shortening, PLAX chordal    (L)    24    %        >=29  LV PW thickness, ED  14.4  mm       ----------  IVS/LV PW ratio, ED                      0.97           <=1.3  Stroke volume, 2D                        73    ml       ----------  Stroke volume/bsa, 2D                    37    ml/m^2   ----------  LV e&', lateral                           5.55  cm/s     ----------  LV E/e&', lateral                         16             ----------  LV e&', medial                            5.33  cm/s     ----------  LV E/e&', medial                          16.66          ----------  LV e&', average                           5.44  cm/s     ----------  LV E/e&', average                         16.32          ----------  Longitudinal strain, TDI                 17    %        ----------    Ventricular septum                       Value          Reference  IVS thickness, ED                        13.9  mm       ----------    LVOT                                     Value          Reference  LVOT ID, S                               20    mm       ----------  LVOT area                                3.14  cm^2     ----------  LVOT peak velocity, S                    86.5  cm/s     ----------  LVOT mean velocity, S                    65.8  cm/s      ----------  LVOT VTI, S                              23.3  cm       ----------    Aortic valve                             Value          Reference  Aortic valve peak velocity, S            355   cm/s     ----------  Aortic valve mean velocity, S            252   cm/s     ----------  Aortic valve VTI, S                      88.7  cm       ----------  Aortic mean gradient, S                  29    mm Hg    ----------  Aortic peak gradient, S                  50    mm Hg    ----------  VTI ratio, LVOT/AV                       0.26           ----------  Aortic valve area, VTI                   0.82  cm^2     ----------  Aortic valve area/bsa, VTI               0.41  cm^2/m^2 ----------  Velocity ratio, peak, LVOT/AV            0.24           ----------  Aortic valve area, peak velocity         0.77  cm^2     ----------  Aortic valve area/bsa, peak              0.39  cm^2/m^2 ----------  velocity  Velocity ratio, mean, LVOT/AV            0.26           ----------  Aortic valve area, mean velocity         0.82  cm^2     ----------  Aortic valve area/bsa, mean              0.41  cm^2/m^2 ----------  velocity  Aortic regurg pressure half-time         547   ms       ----------    Aorta  Value          Reference  Aortic root ID, ED                       32    mm       ----------  Ascending aorta ID, A-P, S               31    mm       ----------    Left atrium                              Value          Reference  LA ID, A-P, ES                           45    mm       ----------  LA ID/bsa, A-P                    (H)    2.26  cm/m^2   <=2.2  LA volume, S                             77.8  ml       ----------  LA volume/bsa, S                         39.1  ml/m^2   ----------  LA volume, ES, 1-p A4C                   57    ml       ----------  LA volume/bsa, ES, 1-p A4C               28.7  ml/m^2   ----------  LA volume, ES, 1-p A2C                   96.3  ml        ----------  LA volume/bsa, ES, 1-p A2C               48.4  ml/m^2   ----------    Mitral valve                             Value          Reference  Mitral E-wave peak velocity              88.8  cm/s     ----------  Mitral A-wave peak velocity              68.9  cm/s     ----------  Mitral deceleration time                 194   ms       150 - 230  Mitral peak gradient, D                  3     mm Hg    ----------  Mitral E/A ratio, peak                   1.3            ----------  Tricuspid valve                          Value          Reference  Tricuspid regurg peak velocity           250   cm/s     ----------  Tricuspid peak RV-RA gradient            25    mm Hg    ----------    Right atrium                             Value          Reference  RA ID, S-I, ES, A4C                      47.8  mm       34 - 49  RA area, ES, A4C                         15.2  cm^2     8.3 - 19.5  RA volume, ES, A/L                       36.2  ml       ----------  RA volume/bsa, ES, A/L                   18.2  ml/m^2   ----------  TEE 01/17/18: Left ventricle: Systolic function was normal. The estimated   ejection fraction was in the range of 50% to 55%. Wall motion was   normal; there were no regional wall motion abnormalities. - Aortic valve: Cusp separation was reduced. There was moderate to   severe stenosis. There was mild regurgitation. Valve area (VTI):   0.73 cm^2. Valve area (Vmax): 0.69 cm^2. Valve area (Vmean): 0.66   cm^2. - Mitral valve: No evidence of vegetation. There was mild   regurgitation. - Left atrium: The atrium was mildly dilated. No evidence of   thrombus in the atrial cavity or appendage. - Right atrium: The atrium was mildly dilated. - Atrial septum: No defect or patent foramen ovale was identified. - Tricuspid valve: No evidence of vegetation. - Pulmonic valve: No evidence of vegetation.  Impressions:  - Low normal LV systolic function; calcified aortic valve  with   fixed left and right coronary cusps; moderate to severe AS (mean   gradient 28 mmHg; AVA 0.85 cm2 by planimetry; dimensionless index   .26); mild AI; mild MR; mild biatrial enlargement; mild TR.  ------------------------------------------------------------------- Study data:   Study status:  Routine.  Consent:  The risks, benefits, and alternatives to the procedure were explained to the patient and informed consent was obtained.  Procedure:  Initial setup. The patient was brought to the laboratory. Surface ECG leads were monitored. Sedation. Conscious sedation was administered. Transesophageal echocardiography. Topical anesthesia was obtained using viscous lidocaine. An adult multiplane transesophageal probe was inserted by the attending cardiologist. Image quality was adequate.  Study completion:  The patient tolerated the procedure well. There were no complications.          Diagnostic transesophageal echocardiography.  2D and color Doppler. Birthdate:  Patient birthdate: 03-30-1932.  Age:  Patient is 84 yr old.  Sex:  Gender: male.    BMI: 27 kg/m^2.  Blood pressure: 167/77  Patient status:  Outpatient.  Study date:  Study date: 01/17/2018. Study time: 11:20 AM.  Location:  Endoscopy.  -------------------------------------------------------------------  ------------------------------------------------------------------- Left ventricle:  Systolic function was normal. The estimated ejection fraction was in the range of 50% to 55%. Wall motion was normal; there were no regional wall motion abnormalities.  ------------------------------------------------------------------- Aortic valve:   Trileaflet; severely calcified leaflets. Cusp separation was reduced.  Doppler:   There was moderate to severe stenosis.   There was mild regurgitation.    VTI ratio of LVOT to aortic valve: 0.23. Valve area (VTI): 0.73 cm^2. Indexed valve area (VTI): 0.37 cm^2/m^2. Peak velocity ratio of  LVOT to aortic valve: 0.22. Valve area (Vmax): 0.69 cm^2. Indexed valve area (Vmax): 0.35 cm^2/m^2. Mean velocity ratio of LVOT to aortic valve: 0.21. Valve area (Vmean): 0.66 cm^2. Indexed valve area (Vmean): 0.33 cm^2/m^2.    Mean gradient (S): 26 mm Hg. Peak gradient (S): 47 mm Hg.  ------------------------------------------------------------------- Aorta:  Descending aorta: The descending aorta had moderate diffuse disease.  ------------------------------------------------------------------- Mitral valve:   Structurally normal valve.   Leaflet separation was normal.  No evidence of vegetation.  Doppler:  There was mild regurgitation.  ------------------------------------------------------------------- Left atrium:  The atrium was mildly dilated.  No evidence of thrombus in the atrial cavity or appendage.  ------------------------------------------------------------------- Atrial septum:  No defect or patent foramen ovale was identified.   ------------------------------------------------------------------- Right ventricle:  The cavity size was normal. Systolic function was normal.  ------------------------------------------------------------------- Pulmonic valve:    Structurally normal valve.   Cusp separation was normal.  No evidence of vegetation.  ------------------------------------------------------------------- Tricuspid valve:   Structurally normal valve.   Leaflet separation was normal.  No evidence of vegetation.  Doppler:  There was mild regurgitation.  ------------------------------------------------------------------- Right atrium:  The atrium was mildly dilated.  ------------------------------------------------------------------- Pericardium:  There was no pericardial effusion.  ------------------------------------------------------------------- Measurements   Left ventricle                           Value  Stroke volume, 2D                         60    ml  Stroke volume/bsa, 2D                    30    ml/m^2    LVOT                                     Value  LVOT ID, S                               20    mm  LVOT area                                3.14  cm^2  LVOT peak velocity, S                    75.1  cm/s  LVOT mean velocity, S                    50.9  cm/s  LVOT VTI, S  19.2  cm  LVOT peak gradient, S                    2     mm Hg    Aortic valve                             Value  Aortic valve peak velocity, S            341   cm/s  Aortic valve mean velocity, S            241   cm/s  Aortic valve VTI, S                      82.5  cm  Aortic mean gradient, S                  26    mm Hg  Aortic peak gradient, S                  47    mm Hg  VTI ratio, LVOT/AV                       0.23  Aortic valve area, VTI                   0.73  cm^2  Aortic valve area/bsa, VTI               0.37  cm^2/m^2  Velocity ratio, peak, LVOT/AV            0.22  Aortic valve area, peak velocity         0.69  cm^2  Aortic valve area/bsa, peak velocity     0.35  cm^2/m^2  Velocity ratio, mean, LVOT/AV            0.21  Aortic valve area, mean velocity         0.66  cm^2  Aortic valve area/bsa, mean velocity     0.33  cm^2/m^2  Recent Labs: 09/23/2017: ALT 19; BUN 23; Creatinine, Ser 1.22; Hemoglobin 14.3; Platelets 182.0 R; Potassium 4.5; Sodium 137     Wt Readings from Last 3 Encounters:  01/27/18 175 lb (79.4 kg)  01/17/18 177 lb (80.3 kg)  01/15/18 177 lb 12.8 oz (80.6 kg)     Other studies Reviewed: Additional studies/ records that were reviewed today include: Echo images, office notes.. Review of the above records demonstrates:    Assessment and Plan:   1. Severe aortic stenosis: He has severe, stage D aortic valve stenosis. I have personally reviewed the echo images. The aortic valve is thickened, calcified with limited leaflet mobility. I think he would benefit from AVR. Given advanced age, He  is not a good candidate for conventional AVR by surgical approach. I think he may be a good candidate for TAVR.   STS Risk Score:  Risk of Mortality:  3.243% Renal Failure:  3.894% Permanent Stroke:  2.742% Prolonged Ventilation:  11.504% DSW Infection:  0.151% Reoperation:  4.460% Morbidity or Mortality:  16.819% Short Length of Stay:  23.937% Long Length of Stay:  8.289%  I have reviewed the natural history of aortic stenosis with the patient and their family members  who are present today. We have discussed the limitations of medical therapy and the poor prognosis associated with symptomatic aortic stenosis. We have reviewed potential treatment  options, including palliative medical therapy, conventional surgical aortic valve replacement, and transcatheter aortic valve replacement. We discussed treatment options in the context of the patient's specific comorbid medical conditions.    He would like to proceed with planning for TAVR. I will arrange a right and left heart catheterization at [facility] 01/31/18. Risks and benefits of the cath procedure and the TAVR procedure are reviewed with the patient today. After the cath, he  will have a cardiac CT, CTA of the chest/abdomen and pelvis, carotid dopplers, PFTs and will then be referred to see one of the CT surgeons on our TAVR team.    Current medicines are reviewed at length with the patient today.  The patient does not have concerns regarding medicines.  The following changes have been made:  No changes  Labs/ tests ordered today include:   Orders Placed This Encounter  Procedures  . Basic metabolic panel  . CBC  . INR/PT     Disposition:   FU with the TAVR team post cath.    Signed, Lauree Chandler, MD 01/27/2018 11:42 AM    [facility] [address], [place], [place]  [address] Phone: [phone]; Fax: [phone]

## 2018-01-30 ENCOUNTER — Other Ambulatory Visit: Payer: Self-pay

## 2018-01-30 ENCOUNTER — Telehealth: Payer: Self-pay | Admitting: *Deleted

## 2018-01-30 DIAGNOSIS — I350 Nonrheumatic aortic (valve) stenosis: Secondary | ICD-10-CM

## 2018-01-30 DIAGNOSIS — R0609 Other forms of dyspnea: Secondary | ICD-10-CM

## 2018-01-30 NOTE — Telephone Encounter (Signed)
Pt contacted pre-catheterization scheduled at [facility] for: Friday January 31, 2018 7:30 AM Verified arrival time and place: [place] Entr[facility] A/[address] [facility] at: 5:30 AM Nothing to eat or drink after midnight prior to cath. Verified no known allergies.  Hold: Metformin 01/30/18, 01/31/18, and 48 hours post cath. Glyburide AM of cath Spironolactone AM of cath  Except hold medications AM meds can be  taken pre-cath with sip of water including: ASA 81 mg   Confirmed patient has responsible person to drive home post procedure and observe patient for 24 hours: yes

## 2018-01-31 ENCOUNTER — Ambulatory Visit (HOSPITAL_COMMUNITY)
Admission: RE | Admit: 2018-01-31 | Discharge: 2018-01-31 | Disposition: A | Discharge status: Home / Self Care | Payer: Medicare Other | Source: Ambulatory Visit | Attending: Cardiovascular Disease | Admitting: Cardiovascular Disease

## 2018-01-31 ENCOUNTER — Encounter (HOSPITAL_COMMUNITY): Payer: Self-pay | Admitting: Cardiovascular Disease

## 2018-01-31 ENCOUNTER — Ambulatory Visit (HOSPITAL_COMMUNITY)
Admission: RE | Disposition: A | Discharge status: Home / Self Care | Payer: Self-pay | Source: Ambulatory Visit | Attending: Cardiovascular Disease

## 2018-01-31 DIAGNOSIS — I2510 Atherosclerotic heart disease of native coronary artery without angina pectoris: Secondary | ICD-10-CM | POA: Diagnosis not present

## 2018-01-31 DIAGNOSIS — Z79899 Other long term (current) drug therapy: Secondary | ICD-10-CM | POA: Insufficient documentation

## 2018-01-31 DIAGNOSIS — Z951 Presence of aortocoronary bypass graft: Secondary | ICD-10-CM | POA: Insufficient documentation

## 2018-01-31 DIAGNOSIS — Z8249 Family history of ischemic heart disease and other diseases of the circulatory system: Secondary | ICD-10-CM | POA: Insufficient documentation

## 2018-01-31 DIAGNOSIS — Z7982 Long term (current) use of aspirin: Secondary | ICD-10-CM | POA: Diagnosis not present

## 2018-01-31 DIAGNOSIS — E785 Hyperlipidemia, unspecified: Secondary | ICD-10-CM | POA: Insufficient documentation

## 2018-01-31 DIAGNOSIS — I350 Nonrheumatic aortic (valve) stenosis: Secondary | ICD-10-CM | POA: Diagnosis not present

## 2018-01-31 DIAGNOSIS — Z833 Family history of diabetes mellitus: Secondary | ICD-10-CM | POA: Insufficient documentation

## 2018-01-31 DIAGNOSIS — Z7984 Long term (current) use of oral hypoglycemic drugs: Secondary | ICD-10-CM | POA: Diagnosis not present

## 2018-01-31 DIAGNOSIS — Z87891 Personal history of nicotine dependence: Secondary | ICD-10-CM | POA: Insufficient documentation

## 2018-01-31 DIAGNOSIS — R0789 Other chest pain: Secondary | ICD-10-CM | POA: Insufficient documentation

## 2018-01-31 DIAGNOSIS — I714 Abdominal aortic aneurysm, without rupture: Secondary | ICD-10-CM | POA: Diagnosis not present

## 2018-01-31 DIAGNOSIS — E119 Type 2 diabetes mellitus without complications: Secondary | ICD-10-CM | POA: Insufficient documentation

## 2018-01-31 DIAGNOSIS — I10 Essential (primary) hypertension: Secondary | ICD-10-CM | POA: Insufficient documentation

## 2018-01-31 HISTORY — PX: RIGHT/LEFT HEART CATH AND CORONARY/GRAFT ANGIOGRAPHY: CATH118267

## 2018-01-31 LAB — POCT I-STAT 3, VENOUS BLOOD GAS (G3P V)
Acid-Base Excess: 1 mmol/L (ref 0.0–2.0)
Bicarbonate: 26.7 mmol/L (ref 20.0–28.0)
O2 SAT: 74 %
PCO2 VEN: 46.2 mmHg (ref 44.0–60.0)
PO2 VEN: 41 mmHg (ref 32.0–45.0)
TCO2: 28 mmol/L (ref 22–32)
pH, Ven: 7.37 (ref 7.250–7.430)

## 2018-01-31 LAB — GLUCOSE, CAPILLARY
Glucose-Capillary: 128 mg/dL — ABNORMAL HIGH (ref 65–99)
Glucose-Capillary: 129 mg/dL — ABNORMAL HIGH (ref 65–99)

## 2018-01-31 LAB — POCT I-STAT 3, ART BLOOD GAS (G3+)
ACID-BASE EXCESS: 1 mmol/L (ref 0.0–2.0)
BICARBONATE: 25.8 mmol/L (ref 20.0–28.0)
O2 Saturation: 98 %
TCO2: 27 mmol/L (ref 22–32)
pCO2 arterial: 41.2 mmHg (ref 32.0–48.0)
pH, Arterial: 7.405 (ref 7.350–7.450)
pO2, Arterial: 114 mmHg — ABNORMAL HIGH (ref 83.0–108.0)

## 2018-01-31 LAB — POCT ACTIVATED CLOTTING TIME: Activated Clotting Time: 169 seconds

## 2018-01-31 SURGERY — RIGHT/LEFT HEART CATH AND CORONARY/GRAFT ANGIOGRAPHY
Anesthesia: LOCAL

## 2018-01-31 MED ORDER — VERAPAMIL HCL 2.5 MG/ML IV SOLN
INTRAVENOUS | Status: DC | PRN
  Administered 2018-01-31: 08:00:00 via INTRA_ARTERIAL

## 2018-01-31 MED ORDER — ASPIRIN 81 MG PO CHEW: 81.0000 mg | CHEWABLE_TABLET | ORAL | Status: DC

## 2018-01-31 MED ORDER — MIDAZOLAM HCL 2 MG/2ML IJ SOLN
INTRAMUSCULAR | Status: DC | PRN
  Administered 2018-01-31 (×2): 1 mg via INTRAVENOUS

## 2018-01-31 MED ORDER — SODIUM CHLORIDE 0.9% FLUSH: 3.0000 mL | Freq: Two times a day (BID) | INTRAVENOUS | Status: DC

## 2018-01-31 MED ORDER — HEPARIN SODIUM (PORCINE) 1000 UNIT/ML IJ SOLN
INTRAMUSCULAR | Status: AC
  Filled 2018-01-31: qty 1

## 2018-01-31 MED ORDER — SODIUM CHLORIDE 0.9 % IV SOLN: 250.0000 mL | INTRAVENOUS | Status: DC | PRN

## 2018-01-31 MED ORDER — LIDOCAINE HCL (PF) 1 % IJ SOLN
INTRAMUSCULAR | Status: AC
  Filled 2018-01-31: qty 30

## 2018-01-31 MED ORDER — FENTANYL CITRATE (PF) 100 MCG/2ML IJ SOLN
INTRAMUSCULAR | Status: DC | PRN
  Administered 2018-01-31 (×2): 25 ug via INTRAVENOUS

## 2018-01-31 MED ORDER — VERAPAMIL HCL 2.5 MG/ML IV SOLN
INTRAVENOUS | Status: AC
  Filled 2018-01-31: qty 2

## 2018-01-31 MED ORDER — SODIUM CHLORIDE 0.9% FLUSH: 3.0000 mL | INTRAVENOUS | Status: DC | PRN

## 2018-01-31 MED ORDER — HEPARIN SODIUM (PORCINE) 1000 UNIT/ML IJ SOLN
INTRAMUSCULAR | Status: DC | PRN
  Administered 2018-01-31: 4000 [IU] via INTRAVENOUS

## 2018-01-31 MED ORDER — MIDAZOLAM HCL 2 MG/2ML IJ SOLN
INTRAMUSCULAR | Status: AC
  Filled 2018-01-31: qty 2

## 2018-01-31 MED ORDER — HEPARIN (PORCINE) IN NACL 2-0.9 UNIT/ML-% IJ SOLN
INTRAMUSCULAR | Status: AC | PRN
  Administered 2018-01-31 (×2): 500 mL via INTRA_ARTERIAL

## 2018-01-31 MED ORDER — NITROGLYCERIN 1 MG/10 ML FOR IR/CATH LAB
INTRA_ARTERIAL | Status: AC
  Filled 2018-01-31: qty 10

## 2018-01-31 MED ORDER — FENTANYL CITRATE (PF) 100 MCG/2ML IJ SOLN
INTRAMUSCULAR | Status: AC
  Filled 2018-01-31: qty 2

## 2018-01-31 MED ORDER — IOPAMIDOL (ISOVUE-370) INJECTION 76%
INTRAVENOUS | Status: AC
  Filled 2018-01-31: qty 100

## 2018-01-31 MED ORDER — HEPARIN (PORCINE) IN NACL 2-0.9 UNIT/ML-% IJ SOLN
INTRAMUSCULAR | Status: AC
  Filled 2018-01-31: qty 1000

## 2018-01-31 MED ORDER — SODIUM CHLORIDE 0.9 % IV SOLN
INTRAVENOUS | Status: AC
  Administered 2018-01-31: 07:00:00 via INTRAVENOUS

## 2018-01-31 MED ORDER — IOPAMIDOL (ISOVUE-370) INJECTION 76%
INTRAVENOUS | Status: DC | PRN
  Administered 2018-01-31: 145 mL via INTRA_ARTERIAL

## 2018-01-31 MED ORDER — SODIUM CHLORIDE 0.9 % IV SOLN: INTRAVENOUS | Status: AC

## 2018-01-31 MED ORDER — IOPAMIDOL (ISOVUE-370) INJECTION 76%
INTRAVENOUS | Status: AC
  Filled 2018-01-31: qty 125

## 2018-01-31 MED ORDER — LIDOCAINE HCL (PF) 1 % IJ SOLN
INTRAMUSCULAR | Status: DC | PRN
  Administered 2018-01-31 (×2): 2 mL

## 2018-01-31 SURGICAL SUPPLY — 19 items
BAND ZEPHYR COMPRESS 30 LONG (HEMOSTASIS) ×2 IMPLANT
CATH BALLN WEDGE 5F 110CM (CATHETERS) ×2 IMPLANT
CATH INFINITI 5 FR AL2 (CATHETERS) ×2 IMPLANT
CATH INFINITI 5 FR LCB (CATHETERS) ×2 IMPLANT
CATH INFINITI 5FR AL1 (CATHETERS) ×2 IMPLANT
CATH INFINITI 5FR MULTPACK ANG (CATHETERS) ×2 IMPLANT
GUIDEWIRE INQWIRE 1.5J.035X260 (WIRE) ×1 IMPLANT
INQWIRE 1.5J .035X260CM (WIRE) ×2
KIT HEART LEFT (KITS) ×2 IMPLANT
PACK CARDIAC CATHETERIZATION (CUSTOM PROCEDURE TRAY) ×2 IMPLANT
PINNACLE LONG 5F 25CM (SHEATH) ×2
SHEATH AVANTI 11CM 5FR (SHEATH) ×2 IMPLANT
SHEATH GLIDE SLENDER 4/5FR (SHEATH) ×2 IMPLANT
SHEATH INTROD PINNACLE 5F 25CM (SHEATH) ×1 IMPLANT
SHEATH RAIN RADIAL 21G 6FR (SHEATH) ×2 IMPLANT
SYR MEDRAD MARK V 150ML (SYRINGE) ×2 IMPLANT
TRANSDUCER W/STOPCOCK (MISCELLANEOUS) ×2 IMPLANT
TUBING CIL FLEX 10 FLL-RA (TUBING) ×2 IMPLANT
WIRE EMERALD ST .035X150CM (WIRE) ×2 IMPLANT

## 2018-01-31 NOTE — Progress Notes (Signed)
Site area: Right groin a 5 french long sheath was removed  Site Prior to Removal:  Level 0  Pressure Applied For 20 MINUTES    Bedrest Beginning at 0945am  Manual:   Yes.    Patient Status During Pull:  stable  Post Pull Groin Site:  Level 0  Post Pull Instructions Given:  Yes.    Post Pull Pulses Present:  Yes.    Dressing Applied:  Yes.    Comments:

## 2018-01-31 NOTE — Progress Notes (Signed)
Ambulated up and down hall with pt, standby assist. Pt tolerated well. Visited bathroom, voided large amount without difficulty. No new bleeding noted after activity.

## 2018-01-31 NOTE — Discharge Instructions (Signed)
Hold metformin for 48 hours post cath.         Femoral Site Care Refer to this sheet in the next few weeks. These instructions provide you with information about caring for yourself after your procedure. Your health care provider may also give you more specific instructions. Your treatment has been planned according to current medical practices, but problems sometimes occur. Call your health care provider if you have any problems or questions after your procedure. What can I expect after the procedure? After your procedure, it is typical to have the following:  Bruising at the site that usually fades within 1-2 weeks.  Blood collecting in the tissue (hematoma) that may be painful to the touch. It should usually decrease in size and tenderness within 1-2 weeks.  Follow these instructions at home:  Take medicines only as directed by your health care provider.  You may shower 24-48 hours after the procedure or as directed by your health care provider. Remove the bandage (dressing) and gently wash the site with plain soap and water. Pat the area dry with a clean towel. Do not rub the site, because this may cause bleeding.  Do not take baths, swim, or use a hot tub until your health care provider approves.  Check your insertion site every day for redness, swelling, or drainage.  Do not apply powder or lotion to the site.  Limit use of stairs to twice a day for the first 2-3 days or as directed by your health care provider.  Do not squat for the first 2-3 days or as directed by your health care provider.  Do not lift over 10 lb (4.5 kg) for 5 days after your procedure or as directed by your health care provider.  Ask your health care provider when it is okay to: ? Return to work or school. ? Resume usual physical activities or sports. ? Resume sexual activity.  Do not drive home if you are discharged the same day as the procedure. Have someone else drive you.  You may drive 24  hours after the procedure unless otherwise instructed by your health care provider.  Do not operate machinery or power tools for 24 hours after the procedure or as directed by your health care provider.  If your procedure was done as an outpatient procedure, which means that you went home the same day as your procedure, a responsible adult should be with you for the first 24 hours after you arrive home.  Keep all follow-up visits as directed by your health care provider. This is important. Contact a health care provider if:  You have a fever.  You have chills.  You have increased bleeding from the site. Hold pressure on the site. Get help right away if:  You have unusual pain at the site.  You have redness, warmth, or swelling at the site.  You have drainage (other than a small amount of blood on the dressing) from the site.  The site is bleeding, and the bleeding does not stop after 30 minutes of holding steady pressure on the site.  Your leg or foot becomes pale, cool, tingly, or numb. This information is not intended to replace advice given to you by your health care provider. Make sure you discuss any questions you have with your health care provider. Document Released: 07/09/2014 Document Revised: 04/12/2016 Document Reviewed: 05/25/2014 Elsevier Interactive Patient Education  2018 [place] Refer to this sheet in the next few weeks. These  instructions provide you with information about caring for yourself after your procedure. Your health care provider may also give you more specific instructions. Your treatment has been planned according to current medical practices, but problems sometimes occur. Call your health care provider if you have any problems or questions after your procedure. What can I expect after the procedure? After your procedure, it is typical to have the following:  Bruising at the radial site that usually fades within 1-2 weeks.  Blood  collecting in the tissue (hematoma) that may be painful to the touch. It should usually decrease in size and tenderness within 1-2 weeks.  Follow these instructions at home:  Take medicines only as directed by your health care provider.  You may shower 24-48 hours after the procedure or as directed by your health care provider. Remove the bandage (dressing) and gently wash the site with plain soap and water. Pat the area dry with a clean towel. Do not rub the site, because this may cause bleeding.  Do not take baths, swim, or use a hot tub until your health care provider approves.  Check your insertion site every day for redness, swelling, or drainage.  Do not apply powder or lotion to the site.  Do not flex or bend the affected arm for 24 hours or as directed by your health care provider.  Do not push or pull heavy objects with the affected arm for 24 hours or as directed by your health care provider.  Do not lift over 10 lb (4.5 kg) for 5 days after your procedure or as directed by your health care provider.  Ask your health care provider when it is okay to: ? Return to work or school. ? Resume usual physical activities or sports. ? Resume sexual activity.  Do not drive home if you are discharged the same day as the procedure. Have someone else drive you.  You may drive 24 hours after the procedure unless otherwise instructed by your health care provider.  Do not operate machinery or power tools for 24 hours after the procedure.  If your procedure was done as an outpatient procedure, which means that you went home the same day as your procedure, a responsible adult should be with you for the first 24 hours after you arrive home.  Keep all follow-up visits as directed by your health care provider. This is important. Contact a health care provider if:  You have a fever.  You have chills.  You have increased bleeding from the radial site. Hold pressure on the site. Get help  right away if:  You have unusual pain at the radial site.  You have redness, warmth, or swelling at the radial site.  You have drainage (other than a small amount of blood on the dressing) from the radial site.  The radial site is bleeding, and the bleeding does not stop after 30 minutes of holding steady pressure on the site.  Your arm or hand becomes pale, cool, tingly, or numb. This information is not intended to replace advice given to you by your health care provider. Make sure you discuss any questions you have with your health care provider. Document Released: 12/08/2010 Document Revised: 04/12/2016 Document Reviewed: 05/24/2014 Elsevier Interactive Patient Education  2018 [employer].

## 2018-01-31 NOTE — Interval H&P Note (Signed)
History and Physical Interval Note:  01/31/2018 7:36 AM  Peter Scott  has presented today for surgery, with the diagnosis of as  The various methods of treatment have been discussed with the patient and family. After consideration of risks, benefits and other options for treatment, the patient has consented to  Procedure(s): RIGHT/LEFT HEART CATH AND CORONARY/GRAFT ANGIOGRAPHY (N/A) as a surgical intervention .  The patient's history has been reviewed, patient examined, no change in status, stable for surgery.  I have reviewed the patient's chart and labs.  Questions were answered to the patient's satisfaction.    Cath Lab Visit (complete for each Cath Lab visit)  Clinical Evaluation Leading to the Procedure:   ACS: No.  Non-ACS:    Anginal Classification: CCS II  Anti-ischemic medical therapy: Minimal Therapy (1 class of medications)  Non-Invasive Test Results: No non-invasive testing performed  Prior CABG: Previous CABG        Lauree Chandler

## 2018-01-31 NOTE — Progress Notes (Signed)
Site area: Right brachial a 5 french venous sheath was removed  Site Prior to Removal:  Level 0  Pressure Applied For 10 MINUTES    Bedrest Beginning at 0945am  Manual:   Yes.    Patient Status During Pull:  stable  Post Pull Groin Site:  Level 0  Post Pull Instructions Given:  Yes.    Post Pull Pulses Present:  Yes.    Dressing Applied:  Yes.    Comments:

## 2018-02-03 MED FILL — Nitroglycerin IV Soln 100 MCG/ML in D5W: INTRA_ARTERIAL | Qty: 10 | Status: AC

## 2018-02-03 MED FILL — Heparin Sodium (Porcine) 2 Unit/ML in Sodium Chloride 0.9%: INTRAMUSCULAR | Qty: 1000 | Status: AC

## 2018-02-11 ENCOUNTER — Ambulatory Visit (HOSPITAL_BASED_OUTPATIENT_CLINIC_OR_DEPARTMENT_OTHER)
Admission: RE | Admit: 2018-02-11 | Discharge: 2018-02-11 | Disposition: A | Discharge status: Home / Self Care | Payer: Medicare Other | Source: Ambulatory Visit | Attending: Cardiovascular Disease | Admitting: Cardiovascular Disease

## 2018-02-11 ENCOUNTER — Ambulatory Visit (HOSPITAL_COMMUNITY)
Admission: RE | Admit: 2018-02-11 | Discharge: 2018-02-11 | Disposition: A | Discharge status: Home / Self Care | Payer: Medicare Other | Source: Ambulatory Visit | Attending: Cardiovascular Disease | Admitting: Cardiovascular Disease

## 2018-02-11 DIAGNOSIS — R0609 Other forms of dyspnea: Secondary | ICD-10-CM | POA: Diagnosis not present

## 2018-02-11 DIAGNOSIS — I2510 Atherosclerotic heart disease of native coronary artery without angina pectoris: Secondary | ICD-10-CM | POA: Diagnosis not present

## 2018-02-11 DIAGNOSIS — Z951 Presence of aortocoronary bypass graft: Secondary | ICD-10-CM | POA: Diagnosis not present

## 2018-02-11 DIAGNOSIS — I700 Atherosclerosis of aorta: Secondary | ICD-10-CM | POA: Insufficient documentation

## 2018-02-11 DIAGNOSIS — K449 Diaphragmatic hernia without obstruction or gangrene: Secondary | ICD-10-CM | POA: Insufficient documentation

## 2018-02-11 DIAGNOSIS — I350 Nonrheumatic aortic (valve) stenosis: Secondary | ICD-10-CM

## 2018-02-11 LAB — PULMONARY FUNCTION TEST
DL/VA % PRED: 74 %
DL/VA: 3.29 ml/min/mmHg/L
DLCO UNC: 12.91 ml/min/mmHg
DLCO unc % pred: 43 %
FEF 25-75 POST: 1.47 L/s
FEF 25-75 Pre: 0.58 L/sec
FEF2575-%Change-Post: 154 %
FEF2575-%PRED-PRE: 36 %
FEF2575-%Pred-Post: 92 %
FEV1-%Change-Post: 19 %
FEV1-%PRED-POST: 75 %
FEV1-%Pred-Pre: 63 %
FEV1-PRE: 1.55 L
FEV1-Post: 1.86 L
FEV1FVC-%Change-Post: 7 %
FEV1FVC-%PRED-PRE: 90 %
FEV6-%Change-Post: 13 %
FEV6-%PRED-POST: 81 %
FEV6-%PRED-PRE: 72 %
FEV6-POST: 2.67 L
FEV6-Pre: 2.36 L
FEV6FVC-%CHANGE-POST: 1 %
FEV6FVC-%PRED-POST: 106 %
FEV6FVC-%Pred-Pre: 104 %
FVC-%Change-Post: 12 %
FVC-%PRED-PRE: 68 %
FVC-%Pred-Post: 76 %
FVC-POST: 2.71 L
FVC-Pre: 2.41 L
POST FEV6/FVC RATIO: 99 %
PRE FEV1/FVC RATIO: 64 %
Post FEV1/FVC ratio: 69 %
Pre FEV6/FVC Ratio: 98 %
RV % pred: 55 %
RV: 1.46 L
TLC % PRED: 60 %
TLC: 4.06 L

## 2018-02-11 LAB — POCT I-STAT CREATININE: CREATININE: 1.2 mg/dL (ref 0.61–1.24)

## 2018-02-11 MED ORDER — ALBUTEROL SULFATE (2.5 MG/3ML) 0.083% IN NEBU
2.5000 mg | INHALATION_SOLUTION | Freq: Once | RESPIRATORY_TRACT | Status: AC
  Administered 2018-02-11: 2.5 mg via RESPIRATORY_TRACT

## 2018-02-11 MED ORDER — IOPAMIDOL (ISOVUE-370) INJECTION 76%
INTRAVENOUS | Status: AC
  Administered 2018-02-11: 50 mL
  Filled 2018-02-11: qty 100

## 2018-02-11 NOTE — Progress Notes (Signed)
Bilateral carotid duplex completed. 1% to 39 % ICA stenosis bilaterally. Vertebral artery flow is antegrade. [facility], [place]  02/11/2018 11:56

## 2018-02-12 ENCOUNTER — Encounter: Payer: Self-pay | Admitting: Family Medicine

## 2018-02-12 ENCOUNTER — Ambulatory Visit (HOSPITAL_BASED_OUTPATIENT_CLINIC_OR_DEPARTMENT_OTHER)
Admission: RE | Admit: 2018-02-12 | Discharge: 2018-02-12 | Disposition: A | Discharge status: Home / Self Care | Payer: Medicare Other | Source: Ambulatory Visit | Attending: Family Medicine | Admitting: Family Medicine

## 2018-02-12 ENCOUNTER — Ambulatory Visit (INDEPENDENT_AMBULATORY_CARE_PROVIDER_SITE_OTHER): Payer: Medicare Other | Admitting: Family Medicine

## 2018-02-12 VITALS — BP 110/72 | HR 71 | Temp 98.1°F | Ht 68.0 in | Wt 179.6 lb

## 2018-02-12 DIAGNOSIS — E119 Type 2 diabetes mellitus without complications: Secondary | ICD-10-CM | POA: Diagnosis not present

## 2018-02-12 DIAGNOSIS — R05 Cough: Secondary | ICD-10-CM

## 2018-02-12 DIAGNOSIS — R918 Other nonspecific abnormal finding of lung field: Secondary | ICD-10-CM | POA: Diagnosis not present

## 2018-02-12 DIAGNOSIS — I2510 Atherosclerotic heart disease of native coronary artery without angina pectoris: Secondary | ICD-10-CM

## 2018-02-12 DIAGNOSIS — I359 Nonrheumatic aortic valve disorder, unspecified: Secondary | ICD-10-CM | POA: Diagnosis not present

## 2018-02-12 DIAGNOSIS — I2583 Coronary atherosclerosis due to lipid rich plaque: Secondary | ICD-10-CM

## 2018-02-12 DIAGNOSIS — I700 Atherosclerosis of aorta: Secondary | ICD-10-CM | POA: Diagnosis not present

## 2018-02-12 DIAGNOSIS — Z951 Presence of aortocoronary bypass graft: Secondary | ICD-10-CM | POA: Diagnosis not present

## 2018-02-12 DIAGNOSIS — R053 Chronic cough: Secondary | ICD-10-CM

## 2018-02-12 LAB — HEMOGLOBIN A1C: HEMOGLOBIN A1C: 7.7 % — AB (ref 4.6–6.5)

## 2018-02-12 MED ORDER — DOXYCYCLINE HYCLATE 100 MG PO CAPS: 100.0000 mg | ORAL_CAPSULE | Freq: Two times a day (BID) | ORAL | 0 refills | Status: DC

## 2018-02-12 MED FILL — DOXYCYCLINE HYCLATE 100 MG: 100 | 10 days supply | Qty: 20 | Fill #0

## 2018-02-12 NOTE — Patient Instructions (Signed)
Please have a chest x-ray on the ground floor- I will be in touch with your report asap Lets try a course of doxycycline antibiotic twice a day for 10 days Also, pick up an OTC allergy antihistamine such as claritin or zyrtec (generic ok!)- this may help with some of your other symptoms  Please let me know how your cough responds to our plan

## 2018-02-12 NOTE — Progress Notes (Addendum)
[facility] [address] [place], [place] [address] [phone]  Date:  02/12/2018   Name:  Peter Scott   DOB:  12-Aug-1932   MRN:  [id number]  PCP:  [name], MD    Chief Complaint: Cough (c/o prod cough with clear mucus. )   History of Present Illness:  Peter Scott is a 84 y.o. very pleasant male patient who presents with the following:  Here today with concern of persistent cough  He just recently had a heart cath and PFTs- he saw Dr. [name] recently due to fatigue and DOE-and they paln a transcatheter aortic valve replacement, TAVR -this is why he had additional recent testing as above per my understanding.  Cardiology note from February:  1 coronary artery disease-patient is doing well with no chest pain.  Continue medical therapy with aspirin and statin. 2 aortic stenosis-patient has increasing dyspnea and fatigue with more vigorous activities.  His most recent echocardiogram was personally reviewed.  His mean gradient suggest moderate aortic stenosis but dimensionless index of 0.24 suggest severe.  I will arrange a transesophageal echocardiogram to better assess aortic valve morphology and hopefully reassess his gradient.  He will likely need referral for consideration of TAVR.  3 abdominal aortic aneurysm-patient will need follow-up study January 2020. 4 hypertension-blood pressure is mildly elevated.  However he has some orthostatic symptoms.  We will continue present medications and follow.  Advance as needed. 5 hyperlipidemia-continue statin   He does have moderate to severe aortic stenosis per TEE earlier this month  They are still completing his pre-op work- up, the operation is not yet scheduled   He has noted a cough for several months. He notes it more when he is laying back in his truck against the seat- this will trigger a coughing fit  He notes that the PFts yesterday seemed to wear out  his chest  The cough is productive of mucus sometimes   He is not wheezing Finds that he gets tired easily but we think this is mostly due to his valve He does have some runny nose and some sneezing, and his eyes may be itchy and irritated at times He is not using any medication for allergies as of yet   Lab Results  Component Value Date   HGBA1C 8.1 (H) 09/23/2017    Patient Active Problem List   Diagnosis Date Noted  . Severe aortic stenosis   . Medicare annual wellness visit, subsequent 10/04/2014  . Aortic stenosis 09/29/2014  . Bruit 09/29/2014  . S/P gastric bypass 07/23/2014  . Abdominal aortic aneurysm ([place]) 07/23/2014  . BCC (basal cell carcinoma) 07/23/2014  . SCC (squamous cell carcinoma), arm 07/23/2014  . Essential hypertension, benign 07/08/2014  . Coronary artery disease 07/08/2014  . Hyperlipidemia LDL goal <100 07/08/2014  . Type II diabetes mellitus, well controlled ([place]) 07/08/2014  . Colon cancer screening 07/08/2014    Past Medical History:  Diagnosis Date  . Aortic aneurysm ([place])   . Aortic stenosis   . CAD (coronary artery disease)   . Diabetes mellitus type II, controlled ([place])   . Heart disease    Ablation  . History of chicken pox   . Hyperlipidemia   . Hypertension   . Umbilical hernia     Past Surgical History:  Procedure Laterality Date  . ABLATION     Rhythm problem; rhythm unknown; [place]  . CORONARY ARTERY  BYPASS GRAFT  2001  . EXPLORATION POST OPERATIVE OPEN HEART  2001, June  . RIGHT/LEFT HEART CATH AND CORONARY/GRAFT ANGIOGRAPHY N/A 01/31/2018   Procedure: RIGHT/LEFT HEART CATH AND CORONARY/GRAFT ANGIOGRAPHY;  Surgeon: Burnell Blanks, MD;  Location: [facility];  Service: Cardiovascular;  Laterality: N/A;  . TEE WITHOUT CARDIOVERSION N/A 01/17/2018   Procedure: TRANSESOPHAGEAL ECHOCARDIOGRAM (TEE);  Surgeon: Lelon Perla, MD;  Location: [facility] ENDOSCOPY;  Service: Cardiovascular;  Laterality: N/A;  .  TONSILLECTOMY    . UMBILICAL HERNIA REPAIR  2015    Social History   Tobacco Use  . Smoking status: Former Smoker    Packs/day: 1.00    Years: 3.00    Pack years: 3.00  . Smokeless tobacco: Never Used  Substance Use Topics  . Alcohol use: No    Alcohol/week: 0.0 oz  . Drug use: No    Family History  Problem Relation Age of Onset  . Diabetes Mother 79       Deceased  . Heart disease Father 8       Deceased  . Heart disease Maternal Uncle   . Diabetes Maternal Uncle   . Heart disease Brother   . Diabetes Brother   . Diabetes Sister        #1  . Heart disease Sister        #2  . Hyperlipidemia Son        x2    No Known Allergies  Medication list has been reviewed and updated.  Current Outpatient Medications on File Prior to Visit  Medication Sig Dispense Refill  . amLODipine (NORVASC) 10 MG tablet TAKE 1/2 TABLET BY MOUTH DAILY 45 tablet 1  . aspirin 81 MG tablet Take 81 mg by mouth daily.    Marland Kitchen atorvastatin (LIPITOR) 80 MG tablet Take 0.5 tablets (40 mg total) by mouth at bedtime. 45 tablet 1  . glucose blood (BAYER CONTOUR TEST) test strip TEST ONCE DAILY FOR DIABETES Dx Code: E11.9 100 each 11  . glyBURIDE (DIABETA) 5 MG tablet TAKE 1/2 (ONE-HALF) TABLET BY MOUTH TWICE DAILY WITH MEALS 90 tablet 1  . losartan (COZAAR) 100 MG tablet TAKE 1/2 TABLET BY MOUTH 2 TIMES A DAY 90 tablet 3  . metFORMIN (GLUCOPHAGE) 500 MG tablet TAKE 2 TABLETS BY MOUTH EVERY MORNING THEN TAKE 1 TABLET BY MOUTH EVERY EVENING 270 tablet 1  . Multiple Vitamins-Minerals (MULTIVITAMIN WITH MINERALS) tablet Take 1 tablet by mouth daily.    Marland Kitchen spironolactone (ALDACTONE) 25 MG tablet Take 0.5 tablets (12.5 mg total) by mouth daily. 45 tablet 1   No current facility-administered medications on file prior to visit.     Review of Systems:  As per HPI- otherwise negative.  Physical Examination: Vitals:   02/12/18 1355  BP: 110/72  Pulse: 71  Temp: 98.1 F (36.7 C)  SpO2: 94%   Vitals:    02/12/18 1355  Weight: 179 lb 9.6 oz (81.5 kg)  Height: 5\' 8"  (1.727 m)   Body mass index is 27.31 kg/m. Ideal Body Weight: Weight in (lb) to have BMI = 25: 164.1  GEN: WDWN, NAD, Non-toxic, A & O x 3, looks well, very spry and sharp for age 84: Atraumatic, Normocephalic. Neck supple. No masses, No LAD.  Bilateral TM wnl, oropharynx normal.  PEERL,EOMI.   Ears and Nose: No external deformity. CV: RRR, No M/G/R. No JVD. No thrill. No extra heart sounds. PULM: CTA B, no wheezes, crackles, rhonchi. No retractions. No resp. distress.  No accessory muscle use. EXTR: No c/c/e NEURO Normal gait.  PSYCH: Normally interactive. Conversant. Not depressed or anxious appearing.  Calm demeanor.    Assessment and Plan: Persistent cough - Plan: DG Chest 2 View, doxycycline (VIBRAMYCIN) 100 MG capsule  Controlled type 2 diabetes mellitus without complication, without long-term current use of insulin (HCC) - Plan: Hemoglobin A1c  Aortic valve disorder  Here today with persistent cough. He is planning a TAVR in the near future.  Coughing for about one year. He has not had a recent CXR.  Allergies may be contributing Obtain CXR today Will treat with a course of doxycycline and he will also try an OTC antihistamine He will let me know if not helpful   Signed Lamar Blinks, MD  Received his CXR  Message to pt  CLINICAL DATA: Productive cough for the past 4 months with worsening symptoms. Former smoker. History of coronary artery disease and CABG, aortic stenosis, peripheral vascular disease, and diabetes.  EXAM: CHEST - 2 VIEW COMPARISON: Chest x-ray of January 13, 2016.  FINDINGS: The right hemidiaphragm remains higher than the left. There is mild hypoinflation of the right lung. The left lung is better inflated. There are coarse lung markings in the left lower lobe which are stable. There is no definite pleural effusion. The heart is top-normal in size. The pulmonary  vascularity is not engorged. There is calcification in the wall of the aortic arch. The sternal wires are intact.  IMPRESSION: Chronic bronchitic-smoking related changes. No acute pneumonia nor CHF. Previous CABG. Thoracic aortic atherosclerosis. Electronically Signed By: David Martinique M.D.  Also received A1c- message to pt Your A1c looks fine, in desired range for your age.  Continue your current medications, let's repeat in about 6 months Lab Results  Component Value Date   HGBA1C 7.7 (H) 02/12/2018

## 2018-02-18 ENCOUNTER — Encounter: Payer: Medicare Other | Admitting: Surgery

## 2018-02-21 MED FILL — SPIRONOLACTONE 25 MG TABS: 25 | 90 days supply | Qty: 45 | Fill #1

## 2018-02-24 ENCOUNTER — Encounter: Payer: Medicare Other | Admitting: Thoracic Surgery (Cardiothoracic Vascular Surgery)

## 2018-02-24 ENCOUNTER — Ambulatory Visit: Payer: Medicare Other | Admitting: Physical Therapy

## 2018-03-05 ENCOUNTER — Encounter: Payer: Medicare Other | Admitting: Surgery

## 2018-03-06 ENCOUNTER — Encounter: Payer: Medicare Other | Admitting: Surgery

## 2018-03-10 ENCOUNTER — Encounter: Payer: Medicare Other | Admitting: Thoracic Surgery (Cardiothoracic Vascular Surgery)

## 2018-03-10 ENCOUNTER — Encounter: Payer: Self-pay | Admitting: Physical Therapy

## 2018-03-10 ENCOUNTER — Ambulatory Visit: Payer: Medicare Other | Attending: Cardiovascular Disease | Admitting: Physical Therapy

## 2018-03-10 ENCOUNTER — Other Ambulatory Visit: Payer: Self-pay

## 2018-03-10 DIAGNOSIS — R2689 Other abnormalities of gait and mobility: Secondary | ICD-10-CM | POA: Diagnosis not present

## 2018-03-10 NOTE — Therapy (Signed)
[facility] [place], [place], [address] Phone: [phone]   Fax:  [phone]  Physical Therapy Treatment  Patient Details  Name: Peter Scott MRN: [id number] Date of Birth: Nov 19, 1932 Referring Provider: Lauree Chandler DR    Encounter Date: 03/10/2018  PT End of Session - 03/10/18 1026    Visit Number  1    Number of Visits  1    Date for PT Re-Evaluation  03/10/18    Authorization Type  medicare     PT Start Time  0928    PT Stop Time  1008    PT Time Calculation (min)  40 min    Activity Tolerance  Patient tolerated treatment well    Behavior During Therapy  [facility] for tasks assessed/performed       Past Medical History:  Diagnosis Date  . Aortic aneurysm ([place])   . Aortic stenosis   . CAD (coronary artery disease)   . Diabetes mellitus type II, controlled ([place])   . Heart disease    Ablation  . History of chicken pox   . Hyperlipidemia   . Hypertension   . Umbilical hernia     Past Surgical History:  Procedure Laterality Date  . ABLATION     Rhythm problem; rhythm unknown; [place] GRAFT  2001  . EXPLORATION POST OPERATIVE OPEN HEART  2001, June  . RIGHT/LEFT HEART CATH AND CORONARY/GRAFT ANGIOGRAPHY N/A 01/31/2018   Procedure: RIGHT/LEFT HEART CATH AND CORONARY/GRAFT ANGIOGRAPHY;  Surgeon: Burnell Blanks, MD;  Location: [facility];  Service: Cardiovascular;  Laterality: N/A;  . TEE WITHOUT CARDIOVERSION N/A 01/17/2018   Procedure: TRANSESOPHAGEAL ECHOCARDIOGRAM (TEE);  Surgeon: Lelon Perla, MD;  Location: [facility] ENDOSCOPY;  Service: Cardiovascular;  Laterality: N/A;  . TONSILLECTOMY    . UMBILICAL HERNIA REPAIR  2015    There were no vitals filed for this visit.  Subjective Assessment - 03/10/18 0932    Subjective  Patient reports he can not walk without becoming fatigued. He can sit down and he feels better after a few minutes.      Pertinent History  CAD DMII,     Limitations  Standing;Walking    How long can you stand comfortably?  limited distances     Currently in Pain?  No/denies         [facility] PT Assessment - 03/10/18 0001      Assessment   Medical Diagnosis  Severe Aortic Stenosis     Referring Provider  Mcalhany, Christphoer    Onset Date/Surgical Date  -- 6 months    Hand Dominance  Right      Precautions   Precautions  None      Restrictions   Weight Bearing Restrictions  No      Balance Screen   Has the patient fallen in the past 6 months  No    Has the patient had a decrease in activity level because of a fear of falling?   No    Is the patient reluctant to leave their home because of a fear of falling?   No      Home Environment   Additional Comments  Steps going into the house. 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 Works out.       Cognition  Overall Cognitive Status  Within Functional Limits for tasks assessed    Attention  Focused    Focused Attention  Appears intact    Memory  Appears intact    Awareness  Appears intact    Problem Solving  Appears intact      Sensation   Additional Comments  dneis parathesias      Coordination   Gross Motor Movements are Fluid and Coordinated  Yes    Fine Motor Movements are Fluid and Coordinated  Yes      ROM / Strength   AROM / PROM / Strength  AROM;PROM;Strength      AROM   Overall AROM Comments  normal AROM       Strength   Overall Strength Comments  5/5 gross     Strength Assessment Site  Hand    Right/Left hand  Right;Left    Right Hand Grip (lbs)  65    Left Hand Grip (lbs)  65       [facility] Pre-Surgical Assessment - 03/10/18 0001    5 Meter Walk Test- trial 1  5 sec    5 Meter Walk Test- trial 2  5 sec.     5 Meter Walk Test- trial 3  5 sec.    5 meter walk test average  5 sec    4 Stage Balance Test Position  4    comment  able to  complete all balance tasks +    Sit To Stand Test- trial 1  5 sec.    Comment  14 sec     ADL/IADL Independent with:  Bathing;Dressing;Meal prep;Finances    ADL/IADL Needs Assistance with:  Yard work    6 Minute Walk- Baseline  yes    BP (mmHg)  148/80    HR (bpm)  55    02 Sat (%RA)  92 %    Modified Borg Scale for Dyspnea  0- Nothing at all    Perceived Rate of Exertion (Borg)  6-    6 Minute Walk Post Test  yes    BP (mmHg)  167/86    HR (bpm)  85    02 Sat (%RA)  89 %    Modified Borg Scale for Dyspnea  2- Mild shortness of breath    Perceived Rate of Exertion (Borg)  7- Very, very light    Aerobic Endurance Distance Walked  1025    Endurance additional comments  No seated rest break required.                          PT Education - 03/10/18 1013    Education provided  Yes    Education Details  purpose of eval, Symptom mangement  during testing.     Person(s) Educated  Patient    Methods  Explanation;Demonstration;Tactile cues;Verbal cues    Comprehension  Verbalized understanding;Returned demonstration;Verbal cues required;Tactile cues required                 Plan - 03/10/18 1027    Clinical Impression Statement  See below     History and Personal Factors relevant to plan of care:  CAD,DMII     Clinical Presentation  Stable    Clinical Decision Making  Low    Rehab Potential  Good    PT Frequency  One time visit       Clinical Impression Statement: Pt is a 84 yo male presenting to OP PT  for evaluation prior to possible TAVR surgery due to severe aortic stenosis. Pt reports onset of dyspnea when walking  approximately 6 months ago. Symptoms are limiting ability to walk distances and perform yard work. Pt presents with good ROM and strength, good  balance and is a low fall risk on 4 stage balance test, normal walking speed and decreased aerobic endurance per 6 minute walk test. Pt ambulated  1025 feet without a seated rest breakj. At time of  rest, patient's HR was 85 bpm and O2 was 89 on room air. Pt reported 2/10 shortness of breath on modified scale for dyspnea.  B/P increased significantly with 6 minute walk test. Based on the Short Physical Performance Battery, patient has a frailty rating of 10/12 with </= 5/12 considered frail.   Patient will benefit from skilled therapeutic intervention in order to improve the following deficits and impairments:  Decreased endurance, Difficulty walking, Decreased activity tolerance  Visit Diagnosis: Other abnormalities of gait and mobility     Problem List Patient Active Problem List   Diagnosis Date Noted  . Severe aortic stenosis   . Medicare annual wellness visit, subsequent 10/04/2014  . Aortic stenosis 09/29/2014  . Bruit 09/29/2014  . S/P gastric bypass 07/23/2014  . Abdominal aortic aneurysm ([place]) 07/23/2014  . BCC (basal cell carcinoma) 07/23/2014  . SCC (squamous cell carcinoma), arm 07/23/2014  . Essential hypertension, benign 07/08/2014  . Coronary artery disease 07/08/2014  . Hyperlipidemia LDL goal <100 07/08/2014  . Type II diabetes mellitus, well controlled ([place]) 07/08/2014  . Colon cancer screening 07/08/2014    Carney Living PT DPT  03/10/2018, 10:35 AM  [facility] [address] [place], [place], [address] Phone: [phone]   Fax:  [phone]  Name: Peter Scott MRN: [id number] Date of Birth: 1932/08/17

## 2018-03-11 ENCOUNTER — Other Ambulatory Visit: Payer: Self-pay

## 2018-03-11 ENCOUNTER — Encounter: Payer: Self-pay | Admitting: Thoracic Surgery (Cardiothoracic Vascular Surgery)

## 2018-03-11 ENCOUNTER — Institutional Professional Consult (permissible substitution) (INDEPENDENT_AMBULATORY_CARE_PROVIDER_SITE_OTHER): Payer: Medicare Other | Admitting: Thoracic Surgery (Cardiothoracic Vascular Surgery)

## 2018-03-11 VITALS — BP 155/79 | HR 64 | Resp 18 | Ht 68.0 in | Wt 169.0 lb

## 2018-03-11 DIAGNOSIS — I2510 Atherosclerotic heart disease of native coronary artery without angina pectoris: Secondary | ICD-10-CM | POA: Diagnosis not present

## 2018-03-11 DIAGNOSIS — I350 Nonrheumatic aortic (valve) stenosis: Secondary | ICD-10-CM

## 2018-03-11 DIAGNOSIS — Z951 Presence of aortocoronary bypass graft: Secondary | ICD-10-CM | POA: Diagnosis not present

## 2018-03-11 DIAGNOSIS — I2583 Coronary atherosclerosis due to lipid rich plaque: Secondary | ICD-10-CM | POA: Diagnosis not present

## 2018-03-11 NOTE — Progress Notes (Signed)
[facility]  MULTIDISCIPLINARY HEART VALVE CLINIC  CARDIOTHORACIC SURGERY CONSULTATION REPORT  Referring Provider is Crenshaw, Denice Bors, MD PCP is Copland, Gay Filler, MD  Chief Complaint  Patient presents with  . Aortic Stenosis    1st TAVR consultation    HPI:  Patient is an 84 year old male with aortic stenosis, coronary artery disease status post coronary artery bypass grafting in the remote past, SVT status post ablation, hypertension, hyperlipidemia, and type 2 diabetes mellitus who has been referred for surgical consultation to discuss treatment options for management of severe aortic stenosis.  Patient's cardiac history dates back to 2001 when he presented with symptomatic tachycardia and was diagnosed with SVT.  He underwent a nuclear stress test that was reportedly abnormal.  He subsequently underwent coronary artery bypass grafting x4 for severe three-vessel coronary artery disease.  One month later he underwent ablation for SVT.  At the time he lived in [place] and details of all procedures are not currently available.  He reportedly did quite well.  The patient later developed a heart murmur and was found to have aortic stenosis which has slowly progressed in severity on follow-up echocardiograms.  Approximately 4 years ago he moved to [place] and he has been followed for the last several years by Dr. [name].  He has complained of gradual progression of decreased exercise tolerance with worsening fatigue.  He remains fairly active physically but he states that he simply gets tired much more easily than he used to.  He occasionally experiences some exertional shortness of breath.  Recent follow-up echocardiogram revealed preserved left ventricular systolic function with progression and severity of aortic stenosis.  Peak velocity across the aortic valve measured 3.6 m/s corresponding to mean transvalvular gradient estimated 29 mmHg.  The DVI was 0.24.  Left  ventricular ejection fraction remain normal and was estimated 55-60%.  The patient subsequently underwent TEE to confirm the presence of severe aortic stenosis.  Left ventricular systolic function was felt to be low normal with ejection fraction estimated 50-55%.  By planimetry the aortic valve area was 0.85 cm.  The dimensionless index was reported 0.26.  There was mild aortic insufficiency and mild mitral regurgitation.  The patient was referred to the multidisciplinary heart valve clinic and underwent diagnostic cardiac catheterization by Dr. Angelena Form.  Catheterization revealed severe native three-vessel coronary artery disease with continued patency of previous bypass grafts.  There was 100% proximal occlusion of the right coronary artery.  There was 70% stenosis of the left circumflex coronary artery there was 90% proximal stenosis of the left anterior descending coronary artery.  The left internal mammary artery remained widely patent.  Vein graft to the right coronary artery was diseased but patent and without flow limiting disease.  Vein grafts to the circumflex territory were not visualized but there was competitive flow seen on antegrade injections in the left circumflex to suggest continued patency of these grafts.  Mean transvalvular gradient across the aortic valve was measured 9 mmHg.  Patient subsequently underwent CT angiography and was referred for surgical consultation.  The patient is married and lives locally in [place] with his wife.  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  The patient remains quite active physically and enjoys working in the yard and around the house.  He spends a great deal of time working on his son's farm.  He complains that over the last few years he has had a slow gradual decline  in his exercise tolerance with worsening fatigue and tendency to get exhausted.  He reports some exertional shortness of breath but only with  strenuous physical activity.  He denies any history of chest pain or chest tightness either with activity or at rest.  He has had 1 or 2 brief dizzy spells but no history of syncope.  He denies any history of PND, orthopnea, or lower extremity edema.  Past Medical History:  Diagnosis Date  . Aortic aneurysm ([place])   . Aortic stenosis   . CAD (coronary artery disease)   . Diabetes mellitus type II, controlled ([place])   . Heart disease    Ablation  . History of chicken pox   . Hyperlipidemia   . Hypertension   . Umbilical hernia     Past Surgical History:  Procedure Laterality Date  . ABLATION     Rhythm problem; rhythm unknown; [place] GRAFT  2001  . EXPLORATION POST OPERATIVE OPEN HEART  2001, June  . RIGHT/LEFT HEART CATH AND CORONARY/GRAFT ANGIOGRAPHY N/A 01/31/2018   Procedure: RIGHT/LEFT HEART CATH AND CORONARY/GRAFT ANGIOGRAPHY;  Surgeon: Burnell Blanks, MD;  Location: [facility];  Service: Cardiovascular;  Laterality: N/A;  . TEE WITHOUT CARDIOVERSION N/A 01/17/2018   Procedure: TRANSESOPHAGEAL ECHOCARDIOGRAM (TEE);  Surgeon: Lelon Perla, MD;  Location: [facility] ENDOSCOPY;  Service: Cardiovascular;  Laterality: N/A;  . TONSILLECTOMY    . UMBILICAL HERNIA REPAIR  2015    Family History  Problem Relation Age of Onset  . Diabetes Mother 69       Deceased  . Heart disease Father 40       Deceased  . Heart disease Maternal Uncle   . Diabetes Maternal Uncle   . Heart disease Brother   . Diabetes Brother   . Diabetes Sister        #1  . Heart disease Sister        #2  . Hyperlipidemia Son        x2    Social History   Socioeconomic History  . Marital status: Married    Spouse name: Not on file  . Number of children: 2  . Years of education: Not on file  . Highest education level: Not on file  Occupational History  . Not on file  Social Needs  . Financial resource strain: Not on file  . Food insecurity:    Worry:  Not on file    Inability: Not on file  . Transportation needs:    Medical: Not on file    Non-medical: Not on file  Tobacco Use  . Smoking status: Former Smoker    Packs/day: 1.00    Years: 3.00    Pack years: 3.00  . Smokeless tobacco: Never Used  Substance and Sexual Activity  . Alcohol use: No    Alcohol/week: 0.0 oz  . Drug use: No  . Sexual activity: Not on file  Lifestyle  . Physical activity:    Days per week: Not on file    Minutes per session: Not on file  . Stress: Not on file  Relationships  . Social connections:    Talks on phone: Not on file    Gets together: Not on file    Attends religious service: Not on file    Active member of club or organization: Not on file    Attends meetings of clubs or organizations: Not on file    Relationship status: Not  on file  . Intimate partner violence:    Fear of current or ex partner: Not on file    Emotionally abused: Not on file    Physically abused: Not on file    Forced sexual activity: Not on file  Other Topics Concern  . Not on file  Social History Narrative  . Not on file    Current Outpatient Medications  Medication Sig Dispense Refill  . amLODipine (NORVASC) 10 MG tablet TAKE 1/2 TABLET BY MOUTH DAILY 45 tablet 1  . aspirin 81 MG tablet Take 81 mg by mouth daily.    Marland Kitchen atorvastatin (LIPITOR) 80 MG tablet Take 0.5 tablets (40 mg total) by mouth at bedtime. 45 tablet 1  . glucose blood (BAYER CONTOUR TEST) test strip TEST ONCE DAILY FOR DIABETES Dx Code: E11.9 100 each 11  . glyBURIDE (DIABETA) 5 MG tablet TAKE 1/2 (ONE-HALF) TABLET BY MOUTH TWICE DAILY WITH MEALS 90 tablet 1  . losartan (COZAAR) 100 MG tablet TAKE 1/2 TABLET BY MOUTH 2 TIMES A DAY 90 tablet 3  . metFORMIN (GLUCOPHAGE) 500 MG tablet TAKE 2 TABLETS BY MOUTH EVERY MORNING THEN TAKE 1 TABLET BY MOUTH EVERY EVENING 270 tablet 1  . spironolactone (ALDACTONE) 25 MG tablet Take 0.5 tablets (12.5 mg total) by mouth daily. 45 tablet 1   No current  facility-administered medications for this visit.     No Known Allergies    Review of Systems:   General:  normal appetite, decreased energy, no weight gain, no weight loss, no fever  Cardiac:  no chest pain with exertion, no chest pain at rest, + SOB with exertion, no resting SOB, no PND, no orthopnea, no palpitations, no arrhythmia, no atrial fibrillation, no LE edema, + dizzy spells, no syncope  Respiratory:  no shortness of breath, no home oxygen, + productive cough, no dry cough, no bronchitis, no wheezing, no hemoptysis, no asthma, no pain with inspiration or cough, no sleep apnea, no CPAP at night  GI:   no difficulty swallowing, no reflux, no frequent heartburn, no hiatal hernia, no abdominal pain, no constipation, no diarrhea, no hematochezia, no hematemesis, no melena  GU:   no dysuria,  no frequency, no urinary tract infection, no hematuria, no enlarged prostate, no kidney stones, no kidney disease  Vascular:  no pain suggestive of claudication, no pain in feet, no leg cramps, no varicose veins, no DVT, no non-healing foot ulcer  Neuro:   no stroke, no TIA's, no seizures, no headaches, no temporary blindness one eye,  no slurred speech, no peripheral neuropathy, no chronic pain, no instability of gait, no memory/cognitive dysfunction  Musculoskeletal: no arthritis, no joint swelling, no myalgias, no difficulty walking, normal mobility   Skin:   no rash, no itching, no skin infections, no pressure sores or ulcerations  Psych:   no anxiety, no depression, no nervousness, no unusual recent stress  Eyes:   no blurry vision, no floaters, no recent vision changes, + wears glasses or contacts  ENT:   no hearing loss, no loose or painful teeth, no dentures, last saw dentist January 2019  Hematologic:  no easy bruising, no abnormal bleeding, no clotting disorder, no frequent epistaxis  Endocrine:  + diabetes, does check CBG's at home           Physical Exam:   BP (!) 155/79 (BP  Location: Left Arm, Patient Position: Sitting, Cuff Size: Normal)   Pulse 64   Resp 18   Ht 5\' 8"  (1.727  m)   Wt 169 lb (76.7 kg)   SpO2 97% Comment: RA  BMI 25.70 kg/m   General:  Elderly but  well-appearing  HEENT:  Unremarkable   Neck:   no JVD, no bruits, no adenopathy   Chest:   clear to auscultation, symmetrical breath sounds, no wheezes, no rhonchi   CV:   RRR, grade II/VI crescendo/decrescendo murmur heard best at RSB,  no diastolic murmur  Abdomen:  soft, non-tender, no masses   Extremities:  warm, well-perfused, pulses palpable, no LE edema  Rectal/GU  Deferred  Neuro:   Grossly non-focal and symmetrical throughout  Skin:   Clean and dry, no rashes, no breakdown   Diagnostic Tests:  Transthoracic Echocardiography  Patient:    Errin, Chewning MR #:       [id number] Study Date: 10/26/2015 Gender:     M Age:        70 Height:     175.3 cm Weight:     77.1 kg BSA:        1.95 m^2 Pt. Status: Room:   ATTENDING    Elson Clan  REFERRING    Brunetta Jeans  PERFORMING   [facility], Outpatient  SONOGRAPHER  Darlina Sicilian, RDCS  cc:  ------------------------------------------------------------------- LV EF: 55% -   60%  ------------------------------------------------------------------- Indications:      Aortic stenosis /insufficiency 424.1.  ------------------------------------------------------------------- History:   PMH:   Atrial fibrillation.  Coronary artery disease. Risk factors:  Hypertension. Diabetes mellitus. Dyslipidemia.  ------------------------------------------------------------------- Study Conclusions  - Left ventricle: The cavity size was normal. Wall thickness was   increased in a pattern of mild LVH. Systolic function was normal.   The estimated ejection fraction was in the range of 55% to 60%.   Wall motion was normal; there were no regional wall motion   abnormalities. Features are consistent  with a pseudonormal left   ventricular filling pattern, with concomitant abnormal relaxation   and increased filling pressure (grade 2 diastolic dysfunction). - Aortic valve: There was moderate stenosis. There was moderate   regurgitation. Valve area (VTI): 0.94 cm^2. Valve area (Vmax):   0.77 cm^2. Valve area (Vmean): 0.7 cm^2. - Left atrium: The atrium was mildly dilated. - Right atrium: The atrium was mildly dilated. - Pulmonary arteries: Systolic pressure was mildly to moderately   increased. PA peak pressure: 37 mm Hg (S).  ------------------------------------------------------------------- Labs, prior tests, procedures, and surgery: Coronary artery bypass grafting.  Transthoracic echocardiography.  M-mode, complete 2D, spectral Doppler, and color Doppler.  Birthdate:  Patient birthdate: 1931/12/04.  Age:  Patient is 84 yr old.  Sex:  Gender: male. BMI: 25.1 kg/m^2.  Blood pressure:     132/75  Patient status: Inpatient.  Study date:  Study date: 10/26/2015. Study time: 11:25 AM.  Location:  Echo laboratory.  -------------------------------------------------------------------  ------------------------------------------------------------------- Left ventricle:  The cavity size was normal. Wall thickness was increased in a pattern of mild LVH. Systolic function was normal. The estimated ejection fraction was in the range of 55% to 60%. Wall motion was normal; there were no regional wall motion abnormalities. Features are consistent with a pseudonormal left ventricular filling pattern, with concomitant abnormal relaxation and increased filling pressure (grade 2 diastolic dysfunction).  ------------------------------------------------------------------- Aortic valve:   Mildly thickened, mildly calcified leaflets. Doppler:   There was moderate stenosis.   There was moderate regurgitation.    VTI ratio of LVOT to aortic valve: 0.33. Valve area (VTI):  0.94 cm^2. Indexed valve  area (VTI): 0.48 cm^2/m^2. Peak velocity ratio of LVOT to aortic valve: 0.27. Valve area (Vmax): 0.77 cm^2. Indexed valve area (Vmax): 0.4 cm^2/m^2. Mean velocity ratio of LVOT to aortic valve: 0.24. Valve area (Vmean): 0.7 cm^2. Indexed valve area (Vmean): 0.36 cm^2/m^2.    Mean gradient (S): 18 mm Hg. Peak gradient (S): 29 mm Hg.  ------------------------------------------------------------------- Aorta:  Aortic root: The aortic root was normal in size. Ascending aorta: The ascending aorta was normal in size.  ------------------------------------------------------------------- Mitral valve:   Structurally normal valve.   Leaflet separation was normal.  Doppler:  Transvalvular velocity was within the normal range. There was no evidence for stenosis. There was no regurgitation.    Peak gradient (D): 5 mm Hg.  ------------------------------------------------------------------- Left atrium:  The atrium was mildly dilated.  ------------------------------------------------------------------- Right ventricle:  The cavity size was normal. Systolic function was normal.  ------------------------------------------------------------------- Pulmonic valve:    The valve appears to be grossly normal. Doppler:  There was no significant regurgitation.  ------------------------------------------------------------------- Tricuspid valve:   Structurally normal valve.   Leaflet separation was normal.  Doppler:  Transvalvular velocity was within the normal range. There was mild regurgitation.  ------------------------------------------------------------------- Pulmonary artery:   Systolic pressure was mildly to moderately increased.  ------------------------------------------------------------------- Right atrium:  The atrium was mildly dilated.  ------------------------------------------------------------------- Pericardium:  There was no pericardial  effusion.  ------------------------------------------------------------------- Measurements   Left ventricle                            Value          Reference  LV ID, ED, PLAX chordal                   45    mm       43 - 52  LV ID, ES, PLAX chordal                   33.6  mm       23 - 38  LV fx shortening, PLAX chordal    (L)     25    %        >=29  LV PW thickness, ED                       12.2  mm       ---------  IVS/LV PW ratio, ED                       1.02           <=1.3  Stroke volume, 2D                         62    ml       ---------  Stroke volume/bsa, 2D                     32    ml/m^2   ---------  LV ejection fraction, 1-p A4C             44    %        ---------  LV end-diastolic volume, 2-p              83    ml       ---------  LV end-systolic volume, 2-p  40    ml       ---------  LV ejection fraction, 2-p                 52    %        ---------  Stroke volume, 2-p                        43    ml       ---------  LV end-diastolic volume/bsa, 2-p          43    ml/m^2   ---------  LV end-systolic volume/bsa, 2-p           21    ml/m^2   ---------  Stroke volume/bsa, 2-p                    22.1  ml/m^2   ---------  LV e&', lateral                            8.38  cm/s     ---------  LV E/e&', lateral                          13.37          ---------  LV e&', medial                             4.48  cm/s     ---------  LV E/e&', medial                           25             ---------  LV e&', average                            6.43  cm/s     ---------  LV E/e&', average                          17.42          ---------    Ventricular septum                        Value          Reference  IVS thickness, ED                         12.5  mm       ---------    LVOT                                      Value          Reference  LVOT ID, S                                19    mm       ---------  LVOT area  2.84  cm^2      ---------  LVOT peak velocity, S                     72.4  cm/s     ---------  LVOT mean velocity, S                     50.2  cm/s     ---------  LVOT VTI, S                               21.7  cm       ---------    Aortic valve                              Value          Reference  Aortic valve peak velocity, S             267   cm/s     ---------  Aortic valve mean velocity, S             205   cm/s     ---------  Aortic valve VTI, S                       65.9  cm       ---------  Aortic mean gradient, S                   18    mm Hg    ---------  Aortic peak gradient, S                   29    mm Hg    ---------  VTI ratio, LVOT/AV                        0.33           ---------  Aortic valve area, VTI                    0.94  cm^2     ---------  Aortic valve area/bsa, VTI                0.48  cm^2/m^2 ---------  Velocity ratio, peak, LVOT/AV             0.27           ---------  Aortic valve area, peak velocity          0.77  cm^2     ---------  Aortic valve area/bsa, peak               0.4   cm^2/m^2 ---------  velocity  Velocity ratio, mean, LVOT/AV             0.24           ---------  Aortic valve area, mean velocity          0.7   cm^2     ---------  Aortic valve area/bsa, mean               0.36  cm^2/m^2 ---------  velocity  Aortic regurg pressure half-time          311   ms       ---------  Aorta                                     Value          Reference  Aortic root ID, ED                        30    mm       ---------    Left atrium                               Value          Reference  LA ID, A-P, ES                            43    mm       ---------  LA ID/bsa, A-P                    (H)     2.21  cm/m^2   <=2.2  LA volume, S                              82    ml       ---------  LA volume/bsa, S                          42.1  ml/m^2   ---------  LA volume, ES, 1-p A4C                    70    ml       ---------  LA volume/bsa, ES, 1-p A4C                36     ml/m^2   ---------  LA volume, ES, 1-p A2C                    95    ml       ---------  LA volume/bsa, ES, 1-p A2C                48.8  ml/m^2   ---------    Mitral valve                              Value          Reference  Mitral E-wave peak velocity               112   cm/s     ---------  Mitral A-wave peak velocity               97.2  cm/s     ---------  Mitral peak gradient, D                   5     mm Hg    ---------  Mitral E/A ratio, peak                    1.2            ---------    Pulmonary arteries  Value          Reference  PA pressure, S, DP                (H)     37    mm Hg    <=30    Tricuspid valve                           Value          Reference  Tricuspid regurg peak velocity            270   cm/s     ---------  Tricuspid peak RV-RA gradient             29    mm Hg    ---------    Systemic veins                            Value          Reference  Estimated CVP                             8     mm Hg    ---------    Right ventricle                           Value          Reference  RV pressure, S, DP                (H)     37    mm Hg    <=30  RV s&', lateral, S                         10    cm/s     ---------    Pulmonic valve                            Value          Reference  Pulmonic valve peak velocity, S           148   cm/s     ---------  Legend: (L)  and  (H)  mark values outside specified reference range.  ------------------------------------------------------------------- Prepared and Electronically Authenticated by  Mertie Moores, M.D. 2016-12-07T15:53:04   Transthoracic Echocardiography  Patient:    Marquise, Wicke MR #:       [id number] Study Date: 10/09/2017 Gender:     M Age:        36 Height:     175.3 cm Weight:     80.3 kg BSA:        1.99 m^2 Pt. Status: Room:   ATTENDING    Copland, Gay Filler  ORDERING     Copland, Jessica C  REFERRING    Copland, [place], [place]  SONOGRAPHER  Jimmy Reel, RDCS  cc:  ------------------------------------------------------------------- LV EF: 55% -   60%  ------------------------------------------------------------------- Indications:      Aortic stenosis 424.1.  ------------------------------------------------------------------- History:   PMH:   Coronary artery disease.  Risk factors: Hypertension. Diabetes mellitus.  ------------------------------------------------------------------- Study Conclusions  - Left ventricle: The cavity size was normal. Wall thickness was   increased  in a pattern of moderate LVH. There was moderate   concentric hypertrophy. Systolic function was normal. The   estimated ejection fraction was in the range of 55% to 60%. Wall   motion was normal; there were no regional wall motion   abnormalities. Features are consistent with a pseudonormal left   ventricular filling pattern, with concomitant abnormal relaxation   and increased filling pressure (grade 2 diastolic dysfunction).   Doppler parameters are consistent with high ventricular filling   pressure. - Aortic valve: Cusp separation was severely reduced. Valve   mobility was restricted. There was moderate to severe stenosis.   There was mild regurgitation. Valve area (VTI): 0.82 cm^2. Valve   area (Vmax): 0.77 cm^2. Valve area (Vmean): 0.82 cm^2. - Mitral valve: There was mild regurgitation. - Left atrium: The atrium was mildly dilated.  ------------------------------------------------------------------- Study data:  No prior study was available for comparison.  Study status:  Routine.  Procedure:  The patient reported no pain pre or post test. Transthoracic echocardiography. Image quality was adequate.  Study completion:  There were no complications. Transthoracic echocardiography.  M-mode, complete 2D, spectral Doppler, and color Doppler.  Birthdate:  Patient birthdate: Jun 08, 1932.  Age:  Patient is 84 yr  old.  Sex:  Gender: male. BMI: 26.1 kg/m^2.  Blood pressure:     140/70  Patient status: Outpatient.  Study date:  Study date: 10/09/2017. Study time: 02:18 PM.  Location:  Echo laboratory.  -------------------------------------------------------------------  ------------------------------------------------------------------- Left ventricle:  Well visualized. The cavity size was normal. Wall thickness was increased in a pattern of moderate LVH. There was moderate concentric hypertrophy. Systolic function was normal. The estimated ejection fraction was in the range of 55% to 60%. Wall motion was normal; there were no regional wall motion abnormalities. The ratio of early ventricular filling to atrial contraction velocities was within the normal range. Features are consistent with a pseudonormal left ventricular filling pattern, with concomitant abnormal relaxation and increased filling pressure (grade 2 diastolic dysfunction). Doppler parameters are consistent with high ventricular filling pressure.  ------------------------------------------------------------------- Aortic valve:   Trileaflet; moderately thickened, moderately calcified leaflets. Cusp separation was severely reduced. Valve mobility was restricted.  Doppler:   There was moderate to severe stenosis.   There was mild regurgitation.    VTI ratio of LVOT to aortic valve: 0.26. Valve area (VTI): 0.82 cm^2. Indexed valve area (VTI): 0.41 cm^2/m^2. Peak velocity ratio of LVOT to aortic valve: 0.24. Valve area (Vmax): 0.77 cm^2. Indexed valve area (Vmax): 0.39 cm^2/m^2. Mean velocity ratio of LVOT to aortic valve: 0.26. Valve area (Vmean): 0.82 cm^2. Indexed valve area (Vmean): 0.41 cm^2/m^2.    Mean gradient (S): 29 mm Hg. Peak gradient (S): 50 mm Hg.  ------------------------------------------------------------------- Aorta:  The aorta was normal, not dilated, and non-diseased. Aortic root: The aortic root was normal in  size.  ------------------------------------------------------------------- Mitral valve:   Structurally normal valve.   Leaflet separation was normal. Mobility was not restricted.  Doppler:  Transvalvular velocity was within the normal range. There was no evidence for stenosis. There was mild regurgitation.    Peak gradient (D): 3 mm Hg.  ------------------------------------------------------------------- Left atrium:  The atrium was mildly dilated.  ------------------------------------------------------------------- Atrial septum:  The septum was normal.  ------------------------------------------------------------------- Pulmonary veins:  Not visualized.  ------------------------------------------------------------------- Right ventricle:  The cavity size was normal. Wall thickness was normal. Systolic function was normal.  ------------------------------------------------------------------- Pulmonic valve:    Structurally normal valve.   Cusp separation was normal.  Doppler:  Transvalvular velocity was  within the normal range. There was no evidence for stenosis. There was no regurgitation.  ------------------------------------------------------------------- Tricuspid valve:   Structurally normal valve.    Doppler: Transvalvular velocity was within the normal range. There was mild regurgitation.  ------------------------------------------------------------------- Pulmonary artery:   The main pulmonary artery was normal-sized. Systolic pressure was within the normal range.  ------------------------------------------------------------------- Right atrium:  The atrium was normal in size.  ------------------------------------------------------------------- Pericardium:  The pericardium was normal in appearance. There was no pericardial effusion.  ------------------------------------------------------------------- Systemic veins: Inferior vena cava: Not  visualized.  ------------------------------------------------------------------- Post procedure conclusions Ascending Aorta:  - The aorta was normal, not dilated, and non-diseased.  ------------------------------------------------------------------- Measurements   Left ventricle                           Value          Reference  LV ID, ED, PLAX chordal           (L)    38.9  mm       43 - 52  LV ID, ES, PLAX chordal                  29.7  mm       23 - 38  LV fx shortening, PLAX chordal    (L)    24    %        >=29  LV PW thickness, ED                      14.4  mm       ----------  IVS/LV PW ratio, ED                      0.97           <=1.3  Stroke volume, 2D                        73    ml       ----------  Stroke volume/bsa, 2D                    37    ml/m^2   ----------  LV e&', lateral                           5.55  cm/s     ----------  LV E/e&', lateral                         16             ----------  LV e&', medial                            5.33  cm/s     ----------  LV E/e&', medial                          16.66          ----------  LV e&', average                           5.44  cm/s     ----------  LV E/e&', average  16.32          ----------  Longitudinal strain, TDI                 17    %        ----------    Ventricular septum                       Value          Reference  IVS thickness, ED                        13.9  mm       ----------    LVOT                                     Value          Reference  LVOT ID, S                               20    mm       ----------  LVOT area                                3.14  cm^2     ----------  LVOT peak velocity, S                    86.5  cm/s     ----------  LVOT mean velocity, S                    65.8  cm/s     ----------  LVOT VTI, S                              23.3  cm       ----------    Aortic valve                             Value          Reference  Aortic valve peak  velocity, S            355   cm/s     ----------  Aortic valve mean velocity, S            252   cm/s     ----------  Aortic valve VTI, S                      88.7  cm       ----------  Aortic mean gradient, S                  29    mm Hg    ----------  Aortic peak gradient, S                  50    mm Hg    ----------  VTI ratio, LVOT/AV                       0.26           ----------  Aortic valve area, VTI                   0.82  cm^2     ----------  Aortic valve area/bsa, VTI               0.41  cm^2/m^2 ----------  Velocity ratio, peak, LVOT/AV            0.24           ----------  Aortic valve area, peak velocity         0.77  cm^2     ----------  Aortic valve area/bsa, peak              0.39  cm^2/m^2 ----------  velocity  Velocity ratio, mean, LVOT/AV            0.26           ----------  Aortic valve area, mean velocity         0.82  cm^2     ----------  Aortic valve area/bsa, mean              0.41  cm^2/m^2 ----------  velocity  Aortic regurg pressure half-time         547   ms       ----------    Aorta                                    Value          Reference  Aortic root ID, ED                       32    mm       ----------  Ascending aorta ID, A-P, S               31    mm       ----------    Left atrium                              Value          Reference  LA ID, A-P, ES                           45    mm       ----------  LA ID/bsa, A-P                    (H)    2.26  cm/m^2   <=2.2  LA volume, S                             77.8  ml       ----------  LA volume/bsa, S                         39.1  ml/m^2   ----------  LA volume, ES, 1-p A4C                   57    ml       ----------  LA volume/bsa, ES, 1-p A4C               28.7  ml/m^2   ----------  LA volume, ES, 1-p A2C                   96.3  ml       ----------  LA volume/bsa, ES, 1-p A2C               48.4  ml/m^2   ----------    Mitral valve                             Value          Reference  Mitral  E-wave peak velocity              88.8  cm/s     ----------  Mitral A-wave peak velocity              68.9  cm/s     ----------  Mitral deceleration time                 194   ms       150 - 230  Mitral peak gradient, D                  3     mm Hg    ----------  Mitral E/A ratio, peak                   1.3            ----------    Tricuspid valve                          Value          Reference  Tricuspid regurg peak velocity           250   cm/s     ----------  Tricuspid peak RV-RA gradient            25    mm Hg    ----------    Right atrium                             Value          Reference  RA ID, S-I, ES, A4C                      47.8  mm       34 - 49  RA area, ES, A4C                         15.2  cm^2     8.3 - 19.5  RA volume, ES, A/L                       36.2  ml       ----------  RA volume/bsa, ES, A/L                   18.2  ml/m^2   ----------  Legend: (L)  and  (H)  mark values outside specified reference range.  ------------------------------------------------------------------- Prepared and Electronically Authenticated by  Shirlee More, MD 2018-11-21T15:49:44   Transesophageal Echocardiography  Patient:    Hav[place], [place]lu[place] MR #:       [id number] Study Date: 01/17/2018 Gender:     M Age:  85 Height:     172.7 cm Weight:     80.7 kg BSA:        1.98 m^2 Pt. Status: Room:   [place] [facility]  Nye Crenshaw  SONOGRAPHER  Cardell Peach, RDCS  cc:  ------------------------------------------------------------------- LV EF: 50% -   55%  ------------------------------------------------------------------- Indications:      Aortic stenosis 424.1.  ------------------------------------------------------------------- History:   PMH:   Coronary artery disease.  Aortic valve disease. Risk factors:  Diabetes mellitus.  Dyslipidemia.  ------------------------------------------------------------------- Study Conclusions  - Left ventricle: Systolic function was normal. The estimated   ejection fraction was in the range of 50% to 55%. Wall motion was   normal; there were no regional wall motion abnormalities. - Aortic valve: Cusp separation was reduced. There was moderate to   severe stenosis. There was mild regurgitation. Valve area (VTI):   0.73 cm^2. Valve area (Vmax): 0.69 cm^2. Valve area (Vmean): 0.66   cm^2. - Mitral valve: No evidence of vegetation. There was mild   regurgitation. - Left atrium: The atrium was mildly dilated. No evidence of   thrombus in the atrial cavity or appendage. - Right atrium: The atrium was mildly dilated. - Atrial septum: No defect or patent foramen ovale was identified. - Tricuspid valve: No evidence of vegetation. - Pulmonic valve: No evidence of vegetation.  Impressions:  - Low normal LV systolic function; calcified aortic valve with   fixed left and right coronary cusps; moderate to severe AS (mean   gradient 28 mmHg; AVA 0.85 cm2 by planimetry; dimensionless index   .26); mild AI; mild MR; mild biatrial enlargement; mild TR.  ------------------------------------------------------------------- Study data:   Study status:  Routine.  Consent:  The risks, benefits, and alternatives to the procedure were explained to the patient and informed consent was obtained.  Procedure:  Initial setup. The patient was brought to the laboratory. Surface ECG leads were monitored. Sedation. Conscious sedation was administered. Transesophageal echocardiography. Topical anesthesia was obtained using viscous lidocaine. An adult multiplane transesophageal probe was inserted by the attending cardiologist. Image quality was adequate.  Study completion:  The patient tolerated the procedure well. There were no complications.          Diagnostic transesophageal echocardiography.   2D and color Doppler. Birthdate:  Patient birthdate: 1932-04-28.  Age:  Patient is 84 yr old.  Sex:  Gender: male.    BMI: 27 kg/m^2.  Blood pressure: 167/77  Patient status:  Outpatient.  Study date:  Study date: 01/17/2018. Study time: 11:20 AM.  Location:  Endoscopy.  -------------------------------------------------------------------  ------------------------------------------------------------------- Left ventricle:  Systolic function was normal. The estimated ejection fraction was in the range of 50% to 55%. Wall motion was normal; there were no regional wall motion abnormalities.  ------------------------------------------------------------------- Aortic valve:   Trileaflet; severely calcified leaflets. Cusp separation was reduced.  Doppler:   There was moderate to severe stenosis.   There was mild regurgitation.    VTI ratio of LVOT to aortic valve: 0.23. Valve area (VTI): 0.73 cm^2. Indexed valve area (VTI): 0.37 cm^2/m^2. Peak velocity ratio of LVOT to aortic valve: 0.22. Valve area (Vmax): 0.69 cm^2. Indexed valve area (Vmax): 0.35 cm^2/m^2. Mean velocity ratio of LVOT to aortic valve: 0.21. Valve area (Vmean): 0.66 cm^2. Indexed valve area (Vmean): 0.33 cm^2/m^2.    Mean gradient (S): 26 mm Hg. Peak  gradient (S): 47 mm Hg.  ------------------------------------------------------------------- Aorta:  Descending aorta: The descending aorta had moderate diffuse disease.  ------------------------------------------------------------------- Mitral valve:   Structurally normal valve.   Leaflet separation was normal.  No evidence of vegetation.  Doppler:  There was mild regurgitation.  ------------------------------------------------------------------- Left atrium:  The atrium was mildly dilated.  No evidence of thrombus in the atrial cavity or appendage.  ------------------------------------------------------------------- Atrial septum:  No defect or patent foramen  ovale was identified.   ------------------------------------------------------------------- Right ventricle:  The cavity size was normal. Systolic function was normal.  ------------------------------------------------------------------- Pulmonic valve:    Structurally normal valve.   Cusp separation was normal.  No evidence of vegetation.  ------------------------------------------------------------------- Tricuspid valve:   Structurally normal valve.   Leaflet separation was normal.  No evidence of vegetation.  Doppler:  There was mild regurgitation.  ------------------------------------------------------------------- Right atrium:  The atrium was mildly dilated.  ------------------------------------------------------------------- Pericardium:  There was no pericardial effusion.  ------------------------------------------------------------------- Measurements   Left ventricle                           Value  Stroke volume, 2D                        60    ml  Stroke volume/bsa, 2D                    30    ml/m^2    LVOT                                     Value  LVOT ID, S                               20    mm  LVOT area                                3.14  cm^2  LVOT peak velocity, S                    75.1  cm/s  LVOT mean velocity, S                    50.9  cm/s  LVOT VTI, S                              19.2  cm  LVOT peak gradient, S                    2     mm Hg    Aortic valve                             Value  Aortic valve peak velocity, S            341   cm/s  Aortic valve mean velocity, S            241   cm/s  Aortic valve VTI, S                      82.5  cm  Aortic mean gradient, S                  26    mm Hg  Aortic peak gradient, S                  47    mm Hg  VTI ratio, LVOT/AV                       0.23  Aortic valve area, VTI                   0.73  cm^2  Aortic valve area/bsa, VTI               0.37  cm^2/m^2  Velocity ratio, peak, LVOT/AV             0.22  Aortic valve area, peak velocity         0.69  cm^2  Aortic valve area/bsa, peak velocity     0.35  cm^2/m^2  Velocity ratio, mean, LVOT/AV            0.21  Aortic valve area, mean velocity         0.66  cm^2  Aortic valve area/bsa, mean velocity     0.33  cm^2/m^2  Legend: (L)  and  (H)  mark values outside specified reference range.  ------------------------------------------------------------------- Prepared and Electronically Authenticated by  Kirk Ruths 2019-03-01T16:27:44   RIGHT/LEFT HEART CATH AND CORONARY/GRAFT ANGIOGRAPHY  Conclusion     Prox RCA lesion is 100% stenosed.  SVG graft was visualized by angiography and is normal in caliber.  Mid LM to Dist LM lesion is 30% stenosed.  Prox Cx to Dist Cx lesion is 70% stenosed.  SVG graft was not visualized due to inability to cannulate.  Prox LAD lesion is 90% stenosed.  Ost 1st Diag lesion is 90% stenosed.   1. 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 As above, there were no graft markers. I could not locate the SVG to the OM but it is open based on competitive flow seen on antegrade injections in the native Circumflex.  2. Aortic stenosis. Cath data is not consistent with echo data. Mean gradient 9 mmHg, peak to peak gradient 9 mmHg, AVA 2.24 cm2. The TEE images demonstrate movement of one of the leaflets. The mean gradient by TEE was 26 mmHg.   Recommendations: I will review case with Dr. [name]. Clinically he has symptoms c/w severe AS. The DVI by echo in November 2018 was 0.23. Cath data is not c/w severe AS. We will proceed with his scans in workup for TAVR.    Indications   Severe aortic stenosis [I35.0 (ICD-10-CM)]  Procedural Details/Technique   Technical Details Indication: 84 yo male with severe AS, CAD with prior CABG in 2001 in [place]. Unable to get op report. Recent worsened dyspnea and fatigue.   Procedure: The risks, benefits, complications,  treatment options, and expected outcomes were discussed with the patient. The patient and/or family concurred with the proposed plan, giving informed consent. The patient was brought to the cath lab after IV hydration was given. The patient was further sedated with Versed and Fentanyl. There was an IV catheter present in the right antecubital vein. This area was prepped and draped. I changed out this catheter for a 5 French sheath. Right heart cath performed with a balloon tipped catheter. The left wrist was prepped and draped  in a sterile fashion. 1% lidocaine was used for local anesthesia. Using the modified Seldinger access technique, a 5 French sheath was placed in the left radial artery. 3 mg Verapamil was given through the sheath. 4000 units IV heparin was given. Standard diagnostic catheters were used to perform selective coronary angiography. I engaged the LIMA graft, native RCA and the SVG to the RCA with the JR4 Catheter. I engaged the left main with a JL4 catheter. I was unable to engage the vein graft to the OM despite multiple attempts and different catheters. I obtained access in the right femoral artery in an attempt to engage the SVG to the OM. I could not find the graft. The graft is open because competitive flow is seen in the OM branch. The aortic valve was crossed with an AL-1 catheter and a straight wire. LV pressures measured. A pigtail was used to perform an aortic root angiogram but I could not visualize the SVG to the OM. The sheath was removed from the left radial artery and a Terumo hemostasis band was applied at the arteriotomy site on the left wrist. Of note, a long sheath was used from the femoral artery due to tortuosity in the iliac system.     Estimated blood loss <50 mL.  During this procedure the patient was administered the following to achieve and maintain moderate conscious sedation: Versed 2 mg, Fentanyl 50 mcg, while the patient's heart rate, blood pressure, and oxygen  saturation were continuously monitored. The period of conscious sedation was 75 minutes, of which I was present face-to-face 100% of this time.  Complications   Complications documented before study signed (01/31/2018 9:09 AM EDT)    RIGHT/LEFT HEART CATH AND CORONARY/GRAFT ANGIOGRAPHY   None Documented by Burnell Blanks, MD 01/31/2018 9:06 AM EDT  Time Range: Intraprocedure      Coronary Findings   Diagnostic  Dominance: Right  Left Main  Mid LM to Dist LM lesion 30% stenosed  Mid LM to Dist LM lesion is 30% stenosed. The lesion is eccentric.  Left Anterior Descending  Prox LAD lesion 90% stenosed  Prox LAD lesion is 90% stenosed.  First Diagonal Branch  Vessel is small in size.  Ost 1st Diag lesion 90% stenosed  Ost 1st Diag lesion is 90% stenosed.  Left Circumflex  Vessel is moderate in size.  Prox Cx to Dist Cx lesion 70% stenosed  Prox Cx to Dist Cx lesion is 70% stenosed.  First Obtuse Marginal Branch  Vessel is small in size.  Third Obtuse Marginal Branch  Vessel is moderate in size.  Right Coronary Artery  Vessel is large.  Prox RCA lesion 100% stenosed  Prox RCA lesion is 100% stenosed. The lesion is chronically occluded.  saphenous Graft to Dist RCA  SVG graft was visualized by angiography and is normal in caliber.  saphenous Graft to Ost 3rd Mrg  SVG graft was not visualized due to inability to cannulate. I could not find the origin of the graft to the OM but the OM branch fills from antegrade flow and competitive flow is seen from the graft.  LIMA Graft to Mid LAD  Intervention   No interventions have been documented.  Coronary Diagrams   Diagnostic Diagram       Implants    No implant documentation for this case.  MERGE Images   Show images for CARDIAC CATHETERIZATION   Link to Procedure Log   Procedure Log    Hemo Data  Most Recent Value  Fick Cardiac Output 5.62 L/min  Fick Cardiac Output Index 2.91 (L/min)/BSA  Aortic Mean  Gradient 9 mmHg  Aortic Peak Gradient 9 mmHg  Aortic Valve Area 2.24  Aortic Value Area Index 1.16 cm2/BSA  RA A Wave 5 mmHg  RA V Wave 7 mmHg  RA Mean 4 mmHg  RV Systolic Pressure 31 mmHg  RV Diastolic Pressure 0 mmHg  RV EDP 5 mmHg  PA Systolic Pressure 28 mmHg  PA Diastolic Pressure 9 mmHg  PA Mean 16 mmHg  PW A Wave 9 mmHg  PW V Wave 8 mmHg  PW Mean 7 mmHg  AO Systolic Pressure 595 mmHg  AO Diastolic Pressure 62 mmHg  AO Mean 91 mmHg  LV Systolic Pressure 638 mmHg  LV Diastolic Pressure 6 mmHg  LV EDP 19 mmHg  Arterial Occlusion Pressure Extended Systolic Pressure 756 mmHg  Arterial Occlusion Pressure Extended Diastolic Pressure 60 mmHg  Arterial Occlusion Pressure Extended Mean Pressure 87 mmHg  Left Ventricular Apex Extended Systolic Pressure 433 mmHg  Left Ventricular Apex Extended Diastolic Pressure 6 mmHg  Left Ventricular Apex Extended EDP Pressure 17 mmHg  QP/QS 1  TPVR Index 5.5 HRUI  TSVR Index 31.26 HRUI  PVR SVR Ratio 0.1  TPVR/TSVR Ratio 0.18    Cardiac TAVR CT  TECHNIQUE: The patient was scanned on a [employer]. A 120 kV retrospective scan was triggered in the descending thoracic aorta at 111 HU's. Gantry rotation speed was 250 msecs and collimation was .6 mm. No beta blockade or nitro were given. The 3D data set was reconstructed in 5% intervals of the R-R cycle. Systolic and diastolic phases were analyzed on a dedicated work station using MPR, MIP and VRT modes. The patient received 80 cc of contrast.  FINDINGS: Aortic Valve: Trileaflet aortic valve with severely thickened and calcified leaflets and moderately restricted leaflet opening. No calcifications are extending into the LVOT.  Aorta: Normal size of the thoracic aorta with moderate diffuse calcifications and atheroma in the descending aorta. No dissection in the thoracic portion of the aorta.  Sinotubular Junction: 26 x 25 mm  Ascending Thoracic Aorta: 29 x 28  mm  Aortic Arch: 28 x 25 mm  Descending Thoracic Aorta: 27 x 27 mm  Sinus of Valsalva Measurements:  Non-coronary: 30 mm  Right -coronary: 31 mm  Left -coronary: 32 mm  Coronary Artery Height above Annulus:  Left Main: 12 mm  Right Coronary: Occluded.  Virtual Basal Annulus Measurements:  Maximum/Minimum Diameter: 28.6 x 22.9 mm  Mean Diameter: 24.9 mm  Perimeter: 81.9 mm  Area: 487 mm2  Optimum Fluoroscopic Angle for Delivery: RAO 13 CRA 5  IMPRESSION: 1. Trileaflet aortic valve with severely thickened and calcified leaflets and moderately restricted leaflet opening. No calcifications are extending into the LVOT. Annular measurement suitable for delivery of a 26 mm Edwards-SAPIEN 3 valve.  2. Sufficient left main to annulus distance, RCA is occluded.  3. Optimum Fluoroscopic Angle for Delivery:  RAO 13 CRA 5  4. No thrombus in the left atrial appendage.   Electronically Signed   By: Ena Dawley   On: 02/12/2018 16:42   CT ANGIOGRAPHY CHEST, ABDOMEN AND PELVIS  TECHNIQUE: Multidetector CT imaging through the chest, abdomen and pelvis was performed using the standard protocol during bolus administration of intravenous contrast. Multiplanar reconstructed images and MIPs were obtained and reviewed to evaluate the vascular anatomy.  CONTRAST:  42mL ISOVUE-370 IOPAMIDOL (ISOVUE-370) INJECTION 76%  COMPARISON:  None.  FINDINGS: CTA CHEST FINDINGS  Cardiovascular: Heart size is enlarged with left atrial dilatation. There is no significant pericardial fluid, thickening or pericardial calcification. There is aortic atherosclerosis, as well as atherosclerosis of the great vessels of the mediastinum and the coronary arteries, including calcified atherosclerotic plaque in the left main, left anterior descending, left circumflex and right coronary arteries. Status post median sternotomy for CABG including LIMA to the LAD.  Severe thickening calcification of the aortic valve.  Mediastinum/Lymph Nodes: No pathologically enlarged mediastinal or hilar lymph nodes. Large hiatal hernia. No axillary lymphadenopathy.  Lungs/Pleura: Linear areas of scarring in the lung bases bilaterally. No consolidative airspace disease. No pleural effusions. No suspicious appearing pulmonary nodules or masses are noted.  Musculoskeletal/Soft Tissues: Median sternotomy wires. There are no aggressive appearing lytic or blastic lesions noted in the visualized portions of the skeleton.  CTA ABDOMEN AND PELVIS FINDINGS  Hepatobiliary: No suspicious cystic or solid hepatic lesions. No intra or extrahepatic biliary ductal dilatation. Gallbladder is normal in appearance.  Pancreas: No pancreatic mass. No pancreatic ductal dilatation. No pancreatic or peripancreatic fluid or inflammatory changes.  Spleen: Unremarkable.  Adrenals/Urinary Tract: Low-attenuation lesions in both kidneys, compatible with simple cysts, largest of which measures up to 1.7 cm in the lower pole of the left kidney. Extensive cortical thinning throughout the interpolar and upper pole region of the left kidney, particularly posteriorly, presumably sequela of prior renal infarction. Some subcentimeter low-attenuation lesions in the left kidney are too small to definitively characterize, but statistically likely to represent cysts. Bilateral adrenal glands are normal in appearance. No hydroureteronephrosis. Urinary bladder is normal in appearance.  Stomach/Bowel: Intraabdominal portion of the stomach is normal in appearance. No pathologic dilatation of small bowel or colon. Normal appendix.  Vascular/Lymphatic: Aortic atherosclerosis with multifocal ectasia of the infrarenal abdominal aorta which measures up to 2.5 x 2.6 cm. Findings and measurements pertinent to potential TAVR procedure, as detailed below. Small penetrating ulcer in the mid  right common iliac artery (axial image 152 of series 15). Celiac axis, superior mesenteric artery and inferior mesenteric artery are all widely patent without hemodynamically significant stenosis. Single right renal artery is widely patent. On the left, there are 2 small renal arteries, both of which are widely patent without hemodynamically significant stenosis. 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 No lymphadenopathy noted in the abdomen or pelvis.  Reproductive: Prostate gland and seminal vesicles are unremarkable in appearance. Small focal fluid collection in the right inguinal region measuring 2.0 x 3.2 cm is of uncertain etiology and significance, but has a benign appearance, potentially some chronically loculated ascites.  Other: No significant volume of free flowing ascites. No pneumoperitoneum.  Musculoskeletal: There are no aggressive appearing lytic or blastic lesions noted in the visualized portions of the skeleton.  VASCULAR MEASUREMENTS PERTINENT TO TAVR:  AORTA:  Minimal Aortic Diameter-16 x 16 mm  Severity of Aortic Calcification-moderate to severe  RIGHT PELVIS:  Right Common Iliac Artery -  Minimal Diameter-8.9 x 9.5 mm  Tortuosity-mild  Calcification-moderate to severe  Note-small penetrating ulcer in the mid right common iliac artery (axial image 152 of series 15).  Right External Iliac Artery -  Minimal Diameter-8.5 x 8.6 mm  Tortuosity-severe  Calcification-minimal  Right Common Femoral Artery -  Minimal Diameter-8.5 x 9.7 mm  Tortuosity-mild  Calcification-mild  LEFT PELVIS:  Left Common  Iliac Artery -  Minimal Diameter-8.2 x 7.8 mm  Tortuosity-mild  Calcification-moderate to severe  Left External Iliac Artery -  Minimal Diameter-9.9 x  9.0 mm  Tortuosity-moderate  Calcification-minimal  Left Common Femoral Artery -  Minimal Diameter-9.0 x 8.8 mm  Tortuosity-mild  Calcification-mild  Review of the MIP images confirms the above findings.  IMPRESSION: 1. Vascular findings and measurements pertinent to potential TAVR procedure, as detailed above. 2. Severe thickening calcification of the aortic valve, compatible with the reported clinical history of severe aortic stenosis. 3. Large hiatal hernia. This may have implications for real-time monitoring of the procedure by TEE at time of upcoming TAVR. 4. Aortic atherosclerosis, in addition to left main and 3 vessel coronary artery disease. Status post median sternotomy for CABG including LIMA to the LAD. 5. Small penetrating ulcer in the mid right common iliac artery. 6. Additional incidental findings, as above.  Aortic Atherosclerosis (ICD10-I70.0).   Electronically Signed   By: Vinnie Langton M.D.   On: 02/12/2018 09:33    STS Risk Calculator  Procedure: Isolated AVR CALCULATE  Risk of Mortality:  2.517%   Renal Failure:  2.670%   Permanent Stroke:  2.467%   Prolonged Ventilation:  9.169%   DSW Infection:  0.146%   Reoperation:  3.884%   Morbidity or Mortality:  14.417%   Short Length of Stay:  35.572%   Long Length of Stay:  5.899%    Procedure: AVR + CAB CALCULATE  Risk of Mortality:  4.549%   Renal Failure:  4.681%   Permanent Stroke:  2.351%   Prolonged Ventilation:  12.871%   DSW Infection:  0.144%   Reoperation:  5.091%   Morbidity or Mortality:  23.322%   Short Length of Stay:  20.298%   Long Length of Stay:  12.472%       Impression:  Patient has stage D severe symptomatic aortic stenosis.  He presents with gradual decline in exercise tolerance with worsening fatigue and exertional shortness of breath consistent with chronic diastolic congestive heart failure, New York Heart Association functional class I-II.  I  have personally reviewed the patient's recent transthoracic and transesophageal echocardiograms, diagnostic cardiac catheterization, and CT angiograms.  The patient's aortic valve is trileaflet.  There is severe thickening and calcification involving the right coronary leaflet.  There was moderate restriction of the remaining 2 leaflets.  Peak velocity across the aortic valve was reported 3.6 m/s corresponding to mean transvalvular gradient estimated 29 mmHg.  The DVI was reported between 0.24 and 0.26.  By planimetry at TEE the valve area was measured 0.85 cm.  Left ventricular systolic function remains normal.  Catheterization revealed severe native three-vessel coronary artery disease but continued patency of bypass grafts placed at the time of the patient's previous coronary artery bypass surgery.  The vein graft to the left circumflex system were never clearly imaged, but there was evidence of good flow because of competitive flow noted on left coronary injection. Given the fact that the patient's symptoms are mild and the severity of aortic stenosis is now just fairly severe, continued medical therapy with close follow-up remains a reasonable option.  However,  I agree the patient might benefit from elective aortic valve replacement.   Risks associated with conventional surgical aortic valve replacement with or without redo coronary artery bypass grafting would be at least moderately elevated.  Cardiac-gated CTA of the heart reveals anatomical characteristics consistent with aortic stenosis suitable for treatment by transcatheter aortic valve replacement without any significant complicating  features and CTA of the aorta and iliac vessels demonstrate what appears to be adequate pelvic vascular access to facilitate a transfemoral approach.  The patient does have a small infrarenal abdominal aortic aneurysm with a short segment limited dissection in the wall.  However, the vessel size is more than big enough to  allow safe passage of an introducing sheath for transcatheter aortic valve replacement.    Plan:  The patient and his wife counseled at length regarding treatment alternatives for management of moderate-severe symptomatic aortic stenosis. Alternative approaches such as conventional aortic valve replacement, transcatheter aortic valve replacement, and continued medical therapy without intervention were compared and contrasted at length.  The risks associated with conventional surgical aortic valve replacement were discussed in detail, as were expectations for post-operative convalescence, and why I would be reluctant to consider this patient a candidate for conventional surgery.  Issues specific to transcatheter aortic valve replacement were discussed including questions about long term valve durability, the potential for paravalvular leak, possible increased risk of need for permanent pacemaker placement, and other technical complications related to the procedure itself.  Long-term prognosis with medical therapy was discussed. This discussion was placed in the context of the patient's own specific clinical presentation and past medical history.  All of their questions have been addressed.  The patient desires to proceed with transcatheter aortic valve replacement in the near future.  We tentatively plan for surgery on Apr 01, 2018.  Following the decision to proceed with transcatheter aortic valve replacement, a discussion has been held regarding what types of management strategies would be attempted intraoperatively in the event of life-threatening complications, including whether or not the patient would be considered a candidate for the use of cardiopulmonary bypass and/or conversion to open sternotomy for attempted surgical intervention.  The patient has been advised of a variety of complications that might develop including but not limited to risks of death, stroke, paravalvular leak, aortic dissection  or other major vascular complications, aortic annulus rupture, device embolization, cardiac rupture or perforation, mitral regurgitation, acute myocardial infarction, arrhythmia, heart block or bradycardia requiring permanent pacemaker placement, congestive heart failure, respiratory failure, renal failure, pneumonia, infection, other late complications related to structural valve deterioration or migration, or other complications that might ultimately cause a temporary or permanent loss of functional independence or other long term morbidity.  The patient provides full informed consent for the procedure as described and all questions were answered.    I spent in excess of 90 minutes during the conduct of this office consultation and >50% of this time involved direct face-to-face encounter with the patient for counseling and/or coordination of their care.    Valentina Gu. Roxy Manns, MD 03/11/2018 9:49 AM

## 2018-03-11 NOTE — H&P (View-Only) (Signed)
[facility]  MULTIDISCIPLINARY HEART VALVE CLINIC  CARDIOTHORACIC SURGERY CONSULTATION REPORT  Referring Provider is Crenshaw, Denice Bors, MD PCP is Copland, Gay Filler, MD  Chief Complaint  Patient presents with  . Aortic Stenosis    1st TAVR consultation    HPI:  Patient is an 84 year old male with aortic stenosis, coronary artery disease status post coronary artery bypass grafting in the remote past, SVT status post ablation, hypertension, hyperlipidemia, and type 2 diabetes mellitus who has been referred for surgical consultation to discuss treatment options for management of severe aortic stenosis.  Patient's cardiac history dates back to 2001 when he presented with symptomatic tachycardia and was diagnosed with SVT.  He underwent a nuclear stress test that was reportedly abnormal.  He subsequently underwent coronary artery bypass grafting x4 for severe three-vessel coronary artery disease.  One month later he underwent ablation for SVT.  At the time he lived in [place] and details of all procedures are not currently available.  He reportedly did quite well.  The patient later developed a heart murmur and was found to have aortic stenosis which has slowly progressed in severity on follow-up echocardiograms.  Approximately 4 years ago he moved to [place] and he has been followed for the last several years by Dr. [name].  He has complained of gradual progression of decreased exercise tolerance with worsening fatigue.  He remains fairly active physically but he states that he simply gets tired much more easily than he used to.  He occasionally experiences some exertional shortness of breath.  Recent follow-up echocardiogram revealed preserved left ventricular systolic function with progression and severity of aortic stenosis.  Peak velocity across the aortic valve measured 3.6 m/s corresponding to mean transvalvular gradient estimated 29 mmHg.  The DVI was 0.24.  Left  ventricular ejection fraction remain normal and was estimated 55-60%.  The patient subsequently underwent TEE to confirm the presence of severe aortic stenosis.  Left ventricular systolic function was felt to be low normal with ejection fraction estimated 50-55%.  By planimetry the aortic valve area was 0.85 cm.  The dimensionless index was reported 0.26.  There was mild aortic insufficiency and mild mitral regurgitation.  The patient was referred to the multidisciplinary heart valve clinic and underwent diagnostic cardiac catheterization by Dr. Angelena Form.  Catheterization revealed severe native three-vessel coronary artery disease with continued patency of previous bypass grafts.  There was 100% proximal occlusion of the right coronary artery.  There was 70% stenosis of the left circumflex coronary artery there was 90% proximal stenosis of the left anterior descending coronary artery.  The left internal mammary artery remained widely patent.  Vein graft to the right coronary artery was diseased but patent and without flow limiting disease.  Vein grafts to the circumflex territory were not visualized but there was competitive flow seen on antegrade injections in the left circumflex to suggest continued patency of these grafts.  Mean transvalvular gradient across the aortic valve was measured 9 mmHg.  Patient subsequently underwent CT angiography and was referred for surgical consultation.  The patient is married and lives locally in [place] with his wife.  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  The patient remains quite active physically and enjoys working in the yard and around the house.  He spends a great deal of time working on his son's farm.  He complains that over the last few years he has had a slow gradual decline  in his exercise tolerance with worsening fatigue and tendency to get exhausted.  He reports some exertional shortness of breath but only with  strenuous physical activity.  He denies any history of chest pain or chest tightness either with activity or at rest.  He has had 1 or 2 brief dizzy spells but no history of syncope.  He denies any history of PND, orthopnea, or lower extremity edema.  Past Medical History:  Diagnosis Date  . Aortic aneurysm ([place])   . Aortic stenosis   . CAD (coronary artery disease)   . Diabetes mellitus type II, controlled ([place])   . Heart disease    Ablation  . History of chicken pox   . Hyperlipidemia   . Hypertension   . Umbilical hernia     Past Surgical History:  Procedure Laterality Date  . ABLATION     Rhythm problem; rhythm unknown; [place] GRAFT  2001  . EXPLORATION POST OPERATIVE OPEN HEART  2001, June  . RIGHT/LEFT HEART CATH AND CORONARY/GRAFT ANGIOGRAPHY N/A 01/31/2018   Procedure: RIGHT/LEFT HEART CATH AND CORONARY/GRAFT ANGIOGRAPHY;  Surgeon: Burnell Blanks, MD;  Location: [facility];  Service: Cardiovascular;  Laterality: N/A;  . TEE WITHOUT CARDIOVERSION N/A 01/17/2018   Procedure: TRANSESOPHAGEAL ECHOCARDIOGRAM (TEE);  Surgeon: Lelon Perla, MD;  Location[facility] END[facility];  Service: Cardiovascular;  Laterality: N/A;  . TONSILLECTOMY    . UMBILICAL HERNIA REPAIR  2015    Family History  Problem Relation Age of Onset  . Diabetes Mother 35       Deceased  . Heart disease Father 57       Deceased  . Heart disease Maternal Uncle   . Diabetes Maternal Uncle   . Heart disease Brother   . Diabetes Brother   . Diabetes Sister        #1  . Heart disease Sister        #2  . Hyperlipidemia Son        x2    Social History   Socioeconomic History  . Marital status: Married    Spouse name: Not on file  . Number of children: 2  . Years of education: Not on file  . Highest education level: Not on file  Occupational History  . Not on file  Social Needs  . Financial resource strain: Not on file  . Food insecurity:    Worry:  Not on file    Inability: Not on file  . Transportation needs:    Medical: Not on file    Non-medical: Not on file  Tobacco Use  . Smoking status: Former Smoker    Packs/day: 1.00    Years: 3.00    Pack years: 3.00  . Smokeless tobacco: Never Used  Substance and Sexual Activity  . Alcohol use: No    Alcohol/week: 0.0 oz  . Drug use: No  . Sexual activity: Not on file  Lifestyle  . Physical activity:    Days per week: Not on file    Minutes per session: Not on file  . Stress: Not on file  Relationships  . Social connections:    Talks on phone: Not on file    Gets together: Not on file    Attends religious service: Not on file    Active member of club or organization: Not on file    Attends meetings of clubs or organizations: Not on file    Relationship status: Not  on file  . Intimate partner violence:    Fear of current or ex partner: Not on file    Emotionally abused: Not on file    Physically abused: Not on file    Forced sexual activity: Not on file  Other Topics Concern  . Not on file  Social History Narrative  . Not on file    Current Outpatient Medications  Medication Sig Dispense Refill  . amLODipine (NORVASC) 10 MG tablet TAKE 1/2 TABLET BY MOUTH DAILY 45 tablet 1  . aspirin 81 MG tablet Take 81 mg by mouth daily.    Marland Kitchen atorvastatin (LIPITOR) 80 MG tablet Take 0.5 tablets (40 mg total) by mouth at bedtime. 45 tablet 1  . glucose blood (BAYER CONTOUR TEST) test strip TEST ONCE DAILY FOR DIABETES Dx Code: E11.9 100 each 11  . glyBURIDE (DIABETA) 5 MG tablet TAKE 1/2 (ONE-HALF) TABLET BY MOUTH TWICE DAILY WITH MEALS 90 tablet 1  . losartan (COZAAR) 100 MG tablet TAKE 1/2 TABLET BY MOUTH 2 TIMES A DAY 90 tablet 3  . metFORMIN (GLUCOPHAGE) 500 MG tablet TAKE 2 TABLETS BY MOUTH EVERY MORNING THEN TAKE 1 TABLET BY MOUTH EVERY EVENING 270 tablet 1  . spironolactone (ALDACTONE) 25 MG tablet Take 0.5 tablets (12.5 mg total) by mouth daily. 45 tablet 1   No current  facility-administered medications for this visit.     No Known Allergies    Review of Systems:   General:  normal appetite, decreased energy, no weight gain, no weight loss, no fever  Cardiac:  no chest pain with exertion, no chest pain at rest, + SOB with exertion, no resting SOB, no PND, no orthopnea, no palpitations, no arrhythmia, no atrial fibrillation, no LE edema, + dizzy spells, no syncope  Respiratory:  no shortness of breath, no home oxygen, + productive cough, no dry cough, no bronchitis, no wheezing, no hemoptysis, no asthma, no pain with inspiration or cough, no sleep apnea, no CPAP at night  GI:   no difficulty swallowing, no reflux, no frequent heartburn, no hiatal hernia, no abdominal pain, no constipation, no diarrhea, no hematochezia, no hematemesis, no melena  GU:   no dysuria,  no frequency, no urinary tract infection, no hematuria, no enlarged prostate, no kidney stones, no kidney disease  Vascular:  no pain suggestive of claudication, no pain in feet, no leg cramps, no varicose veins, no DVT, no non-healing foot ulcer  Neuro:   no stroke, no TIA's, no seizures, no headaches, no temporary blindness one eye,  no slurred speech, no peripheral neuropathy, no chronic pain, no instability of gait, no memory/cognitive dysfunction  Musculoskeletal: no arthritis, no joint swelling, no myalgias, no difficulty walking, normal mobility   Skin:   no rash, no itching, no skin infections, no pressure sores or ulcerations  Psych:   no anxiety, no depression, no nervousness, no unusual recent stress  Eyes:   no blurry vision, no floaters, no recent vision changes, + wears glasses or contacts  ENT:   no hearing loss, no loose or painful teeth, no dentures, last saw dentist January 2019  Hematologic:  no easy bruising, no abnormal bleeding, no clotting disorder, no frequent epistaxis  Endocrine:  + diabetes, does check CBG's at home           Physical Exam:   BP (!) 155/79 (BP  Location: Left Arm, Patient Position: Sitting, Cuff Size: Normal)   Pulse 64   Resp 18   Ht 5\' 8"  (1.727  m)   Wt 169 lb (76.7 kg)   SpO2 97% Comment: RA  BMI 25.70 kg/m   General:  Elderly but  well-appearing  HEENT:  Unremarkable   Neck:   no JVD, no bruits, no adenopathy   Chest:   clear to auscultation, symmetrical breath sounds, no wheezes, no rhonchi   CV:   RRR, grade II/VI crescendo/decrescendo murmur heard best at RSB,  no diastolic murmur  Abdomen:  soft, non-tender, no masses   Extremities:  warm, well-perfused, pulses palpable, no LE edema  Rectal/GU  Deferred  Neuro:   Grossly non-focal and symmetrical throughout  Skin:   Clean and dry, no rashes, no breakdown   Diagnostic Tests:  Transthoracic Echocardiography  Patient:    Raimundo, Corbit MR #:       [id number] Study Date: 10/26/2015 Gender:     M Age:        61 Height:     175.3 cm Weight:     77.1 kg BSA:        1.95 m^2 Pt. Status: Room:   ATTENDING    Elson Clan  REFERRING    Brunetta Jeans  PERFORMING   [facility], Outpatient  SONOGRAPHER  Darlina Sicilian, RDCS  cc:  ------------------------------------------------------------------- LV EF: 55% -   60%  ------------------------------------------------------------------- Indications:      Aortic stenosis /insufficiency 424.1.  ------------------------------------------------------------------- History:   PMH:   Atrial fibrillation.  Coronary artery disease. Risk factors:  Hypertension. Diabetes mellitus. Dyslipidemia.  ------------------------------------------------------------------- Study Conclusions  - Left ventricle: The cavity size was normal. Wall thickness was   increased in a pattern of mild LVH. Systolic function was normal.   The estimated ejection fraction was in the range of 55% to 60%.   Wall motion was normal; there were no regional wall motion   abnormalities. Features are consistent  with a pseudonormal left   ventricular filling pattern, with concomitant abnormal relaxation   and increased filling pressure (grade 2 diastolic dysfunction). - Aortic valve: There was moderate stenosis. There was moderate   regurgitation. Valve area (VTI): 0.94 cm^2. Valve area (Vmax):   0.77 cm^2. Valve area (Vmean): 0.7 cm^2. - Left atrium: The atrium was mildly dilated. - Right atrium: The atrium was mildly dilated. - Pulmonary arteries: Systolic pressure was mildly to moderately   increased. PA peak pressure: 37 mm Hg (S).  ------------------------------------------------------------------- Labs, prior tests, procedures, and surgery: Coronary artery bypass grafting.  Transthoracic echocardiography.  M-mode, complete 2D, spectral Doppler, and color Doppler.  Birthdate:  Patient birthdate: [birth year].  Age:  Patient is 84 yr old.  Sex:  Gender: male. BMI: 25.1 kg/m^2.  Blood pressure:     132/75  Patient status: Inpatient.  Study date:  Study date: 10/26/2015. Study time: 11:25 AM.  Location:  Echo laboratory.  -------------------------------------------------------------------  ------------------------------------------------------------------- Left ventricle:  The cavity size was normal. Wall thickness was increased in a pattern of mild LVH. Systolic function was normal. The estimated ejection fraction was in the range of 55% to 60%. Wall motion was normal; there were no regional wall motion abnormalities. Features are consistent with a pseudonormal left ventricular filling pattern, with concomitant abnormal relaxation and increased filling pressure (grade 2 diastolic dysfunction).  ------------------------------------------------------------------- Aortic valve:   Mildly thickened, mildly calcified leaflets. Doppler:   There was moderate stenosis.   There was moderate regurgitation.    VTI ratio of LVOT to aortic valve: 0.33. Valve area (VTI):  0.94 cm^2. Indexed valve  area (VTI): 0.48 cm^2/m^2. Peak velocity ratio of LVOT to aortic valve: 0.27. Valve area (Vmax): 0.77 cm^2. Indexed valve area (Vmax): 0.4 cm^2/m^2. Mean velocity ratio of LVOT to aortic valve: 0.24. Valve area (Vmean): 0.7 cm^2. Indexed valve area (Vmean): 0.36 cm^2/m^2.    Mean gradient (S): 18 mm Hg. Peak gradient (S): 29 mm Hg.  ------------------------------------------------------------------- Aorta:  Aortic root: The aortic root was normal in size. Ascending aorta: The ascending aorta was normal in size.  ------------------------------------------------------------------- Mitral valve:   Structurally normal valve.   Leaflet separation was normal.  Doppler:  Transvalvular velocity was within the normal range. There was no evidence for stenosis. There was no regurgitation.    Peak gradient (D): 5 mm Hg.  ------------------------------------------------------------------- Left atrium:  The atrium was mildly dilated.  ------------------------------------------------------------------- Right ventricle:  The cavity size was normal. Systolic function was normal.  ------------------------------------------------------------------- Pulmonic valve:    The valve appears to be grossly normal. Doppler:  There was no significant regurgitation.  ------------------------------------------------------------------- Tricuspid valve:   Structurally normal valve.   Leaflet separation was normal.  Doppler:  Transvalvular velocity was within the normal range. There was mild regurgitation.  ------------------------------------------------------------------- Pulmonary artery:   Systolic pressure was mildly to moderately increased.  ------------------------------------------------------------------- Right atrium:  The atrium was mildly dilated.  ------------------------------------------------------------------- Pericardium:  There was no pericardial  effusion.  ------------------------------------------------------------------- Measurements   Left ventricle                            Value          Reference  LV ID, ED, PLAX chordal                   45    mm       43 - 52  LV ID, ES, PLAX chordal                   33.6  mm       23 - 38  LV fx shortening, PLAX chordal    (L)     25    %        >=29  LV PW thickness, ED                       12.2  mm       ---------  IVS/LV PW ratio, ED                       1.02           <=1.3  Stroke volume, 2D                         62    ml       ---------  Stroke volume/bsa, 2D                     32    ml/m^2   ---------  LV ejection fraction, 1-p A4C             44    %        ---------  LV end-diastolic volume, 2-p              83    ml       ---------  LV end-systolic volume, 2-p  40    ml       ---------  LV ejection fraction, 2-p                 52    %        ---------  Stroke volume, 2-p                        43    ml       ---------  LV end-diastolic volume/bsa, 2-p          43    ml/m^2   ---------  LV end-systolic volume/bsa, 2-p           21    ml/m^2   ---------  Stroke volume/bsa, 2-p                    22.1  ml/m^2   ---------  LV e&', lateral                            8.38  cm/s     ---------  LV E/e&', lateral                          13.37          ---------  LV e&', medial                             4.48  cm/s     ---------  LV E/e&', medial                           25             ---------  LV e&', average                            6.43  cm/s     ---------  LV E/e&', average                          17.42          ---------    Ventricular septum                        Value          Reference  IVS thickness, ED                         12.5  mm       ---------    LVOT                                      Value          Reference  LVOT ID, S                                19    mm       ---------  LVOT area  2.84  cm^2      ---------  LVOT peak velocity, S                     72.4  cm/s     ---------  LVOT mean velocity, S                     50.2  cm/s     ---------  LVOT VTI, S                               21.7  cm       ---------    Aortic valve                              Value          Reference  Aortic valve peak velocity, S             267   cm/s     ---------  Aortic valve mean velocity, S             205   cm/s     ---------  Aortic valve VTI, S                       65.9  cm       ---------  Aortic mean gradient, S                   18    mm Hg    ---------  Aortic peak gradient, S                   29    mm Hg    ---------  VTI ratio, LVOT/AV                        0.33           ---------  Aortic valve area, VTI                    0.94  cm^2     ---------  Aortic valve area/bsa, VTI                0.48  cm^2/m^2 ---------  Velocity ratio, peak, LVOT/AV             0.27           ---------  Aortic valve area, peak velocity          0.77  cm^2     ---------  Aortic valve area/bsa, peak               0.4   cm^2/m^2 ---------  velocity  Velocity ratio, mean, LVOT/AV             0.24           ---------  Aortic valve area, mean velocity          0.7   cm^2     ---------  Aortic valve area/bsa, mean               0.36  cm^2/m^2 ---------  velocity  Aortic regurg pressure half-time          311   ms       ---------  Aorta                                     Value          Reference  Aortic root ID, ED                        30    mm       ---------    Left atrium                               Value          Reference  LA ID, A-P, ES                            43    mm       ---------  LA ID/bsa, A-P                    (H)     2.21  cm/m^2   <=2.2  LA volume, S                              82    ml       ---------  LA volume/bsa, S                          42.1  ml/m^2   ---------  LA volume, ES, 1-p A4C                    70    ml       ---------  LA volume/bsa, ES, 1-p A4C                36     ml/m^2   ---------  LA volume, ES, 1-p A2C                    95    ml       ---------  LA volume/bsa, ES, 1-p A2C                48.8  ml/m^2   ---------    Mitral valve                              Value          Reference  Mitral E-wave peak velocity               112   cm/s     ---------  Mitral A-wave peak velocity               97.2  cm/s     ---------  Mitral peak gradient, D                   5     mm Hg    ---------  Mitral E/A ratio, peak                    1.2            ---------    Pulmonary arteries  Value          Reference  PA pressure, S, DP                (H)     37    mm Hg    <=30    Tricuspid valve                           Value          Reference  Tricuspid regurg peak velocity            270   cm/s     ---------  Tricuspid peak RV-RA gradient             29    mm Hg    ---------    Systemic veins                            Value          Reference  Estimated CVP                             8     mm Hg    ---------    Right ventricle                           Value          Reference  RV pressure, S, DP                (H)     37    mm Hg    <=30  RV s&', lateral, S                         10    cm/s     ---------    Pulmonic valve                            Value          Reference  Pulmonic valve peak velocity, S           148   cm/s     ---------  Legend: (L)  and  (H)  mark values outside specified reference range.  ------------------------------------------------------------------- Prepared and Electronically Authenticated by  Mertie Moores, M.D. 2016-12-07T15:53:04   Transthoracic Echocardiography  Patient:    Peter Scott, Peter Scott MR #:       [id number] Study Date: 10/09/2017 Gender:     M Age:        89 Height:     175.3 cm Weight:     80.3 kg BSA:        1.99 m^2 Pt. Status: Room:   ATTENDING    Copland, Gay Filler  ORDERING     Copland, Jessica C  REFERRING    Copland, [place], [place]  SONOGRAPHER  Jimmy Reel, RDCS  cc:  ------------------------------------------------------------------- LV EF: 55% -   60%  ------------------------------------------------------------------- Indications:      Aortic stenosis 424.1.  ------------------------------------------------------------------- History:   PMH:   Coronary artery disease.  Risk factors: Hypertension. Diabetes mellitus.  ------------------------------------------------------------------- Study Conclusions  - Left ventricle: The cavity size was normal. Wall thickness was   increased  in a pattern of moderate LVH. There was moderate   concentric hypertrophy. Systolic function was normal. The   estimated ejection fraction was in the range of 55% to 60%. Wall   motion was normal; there were no regional wall motion   abnormalities. Features are consistent with a pseudonormal left   ventricular filling pattern, with concomitant abnormal relaxation   and increased filling pressure (grade 2 diastolic dysfunction).   Doppler parameters are consistent with high ventricular filling   pressure. - Aortic valve: Cusp separation was severely reduced. Valve   mobility was restricted. There was moderate to severe stenosis.   There was mild regurgitation. Valve area (VTI): 0.82 cm^2. Valve   area (Vmax): 0.77 cm^2. Valve area (Vmean): 0.82 cm^2. - Mitral valve: There was mild regurgitation. - Left atrium: The atrium was mildly dilated.  ------------------------------------------------------------------- Study data:  No prior study was available for comparison.  Study status:  Routine.  Procedure:  The patient reported no pain pre or post test. Transthoracic echocardiography. Image quality was adequate.  Study completion:  There were no complications. Transthoracic echocardiography.  M-mode, complete 2D, spectral Doppler, and color Doppler.  Birthdate:  Patient birthdate: 1932/01/05.  Age:  Patient is 84 yr  old.  Sex:  Gender: male. BMI: 26.1 kg/m^2.  Blood pressure:     140/70  Patient status: Outpatient.  Study date:  Study date: 10/09/2017. Study time: 02:18 PM.  Location:  Echo laboratory.  -------------------------------------------------------------------  ------------------------------------------------------------------- Left ventricle:  Well visualized. The cavity size was normal. Wall thickness was increased in a pattern of moderate LVH. There was moderate concentric hypertrophy. Systolic function was normal. The estimated ejection fraction was in the range of 55% to 60%. Wall motion was normal; there were no regional wall motion abnormalities. The ratio of early ventricular filling to atrial contraction velocities was within the normal range. Features are consistent with a pseudonormal left ventricular filling pattern, with concomitant abnormal relaxation and increased filling pressure (grade 2 diastolic dysfunction). Doppler parameters are consistent with high ventricular filling pressure.  ------------------------------------------------------------------- Aortic valve:   Trileaflet; moderately thickened, moderately calcified leaflets. Cusp separation was severely reduced. Valve mobility was restricted.  Doppler:   There was moderate to severe stenosis.   There was mild regurgitation.    VTI ratio of LVOT to aortic valve: 0.26. Valve area (VTI): 0.82 cm^2. Indexed valve area (VTI): 0.41 cm^2/m^2. Peak velocity ratio of LVOT to aortic valve: 0.24. Valve area (Vmax): 0.77 cm^2. Indexed valve area (Vmax): 0.39 cm^2/m^2. Mean velocity ratio of LVOT to aortic valve: 0.26. Valve area (Vmean): 0.82 cm^2. Indexed valve area (Vmean): 0.41 cm^2/m^2.    Mean gradient (S): 29 mm Hg. Peak gradient (S): 50 mm Hg.  ------------------------------------------------------------------- Aorta:  The aorta was normal, not dilated, and non-diseased. Aortic root: The aortic root was normal in  size.  ------------------------------------------------------------------- Mitral valve:   Structurally normal valve.   Leaflet separation was normal. Mobility was not restricted.  Doppler:  Transvalvular velocity was within the normal range. There was no evidence for stenosis. There was mild regurgitation.    Peak gradient (D): 3 mm Hg.  ------------------------------------------------------------------- Left atrium:  The atrium was mildly dilated.  ------------------------------------------------------------------- Atrial septum:  The septum was normal.  ------------------------------------------------------------------- Pulmonary veins:  Not visualized.  ------------------------------------------------------------------- Right ventricle:  The cavity size was normal. Wall thickness was normal. Systolic function was normal.  ------------------------------------------------------------------- Pulmonic valve:    Structurally normal valve.   Cusp separation was normal.  Doppler:  Transvalvular velocity was  within the normal range. There was no evidence for stenosis. There was no regurgitation.  ------------------------------------------------------------------- Tricuspid valve:   Structurally normal valve.    Doppler: Transvalvular velocity was within the normal range. There was mild regurgitation.  ------------------------------------------------------------------- Pulmonary artery:   The main pulmonary artery was normal-sized. Systolic pressure was within the normal range.  ------------------------------------------------------------------- Right atrium:  The atrium was normal in size.  ------------------------------------------------------------------- Pericardium:  The pericardium was normal in appearance. There was no pericardial effusion.  ------------------------------------------------------------------- Systemic veins: Inferior vena cava: Not  visualized.  ------------------------------------------------------------------- Post procedure conclusions Ascending Aorta:  - The aorta was normal, not dilated, and non-diseased.  ------------------------------------------------------------------- Measurements   Left ventricle                           Value          Reference  LV ID, ED, PLAX chordal           (L)    38.9  mm       43 - 52  LV ID, ES, PLAX chordal                  29.7  mm       23 - 38  LV fx shortening, PLAX chordal    (L)    24    %        >=29  LV PW thickness, ED                      14.4  mm       ----------  IVS/LV PW ratio, ED                      0.97           <=1.3  Stroke volume, 2D                        73    ml       ----------  Stroke volume/bsa, 2D                    37    ml/m^2   ----------  LV e&', lateral                           5.55  cm/s     ----------  LV E/e&', lateral                         16             ----------  LV e&', medial                            5.33  cm/s     ----------  LV E/e&', medial                          16.66          ----------  LV e&', average                           5.44  cm/s     ----------  LV E/e&', average  16.32          ----------  Longitudinal strain, TDI                 17    %        ----------    Ventricular septum                       Value          Reference  IVS thickness, ED                        13.9  mm       ----------    LVOT                                     Value          Reference  LVOT ID, S                               20    mm       ----------  LVOT area                                3.14  cm^2     ----------  LVOT peak velocity, S                    86.5  cm/s     ----------  LVOT mean velocity, S                    65.8  cm/s     ----------  LVOT VTI, S                              23.3  cm       ----------    Aortic valve                             Value          Reference  Aortic valve peak  velocity, S            355   cm/s     ----------  Aortic valve mean velocity, S            252   cm/s     ----------  Aortic valve VTI, S                      88.7  cm       ----------  Aortic mean gradient, S                  29    mm Hg    ----------  Aortic peak gradient, S                  50    mm Hg    ----------  VTI ratio, LVOT/AV                       0.26           ----------  Aortic valve area, VTI                   0.82  cm^2     ----------  Aortic valve area/bsa, VTI               0.41  cm^2/m^2 ----------  Velocity ratio, peak, LVOT/AV            0.24           ----------  Aortic valve area, peak velocity         0.77  cm^2     ----------  Aortic valve area/bsa, peak              0.39  cm^2/m^2 ----------  velocity  Velocity ratio, mean, LVOT/AV            0.26           ----------  Aortic valve area, mean velocity         0.82  cm^2     ----------  Aortic valve area/bsa, mean              0.41  cm^2/m^2 ----------  velocity  Aortic regurg pressure half-time         547   ms       ----------    Aorta                                    Value          Reference  Aortic root ID, ED                       32    mm       ----------  Ascending aorta ID, A-P, S               31    mm       ----------    Left atrium                              Value          Reference  LA ID, A-P, ES                           45    mm       ----------  LA ID/bsa, A-P                    (H)    2.26  cm/m^2   <=2.2  LA volume, S                             77.8  ml       ----------  LA volume/bsa, S                         39.1  ml/m^2   ----------  LA volume, ES, 1-p A4C                   57    ml       ----------  LA volume/bsa, ES, 1-p A4C               28.7  ml/m^2   ----------  LA volume, ES, 1-p A2C                   96.3  ml       ----------  LA volume/bsa, ES, 1-p A2C               48.4  ml/m^2   ----------    Mitral valve                             Value          Reference  Mitral  E-wave peak velocity              88.8  cm/s     ----------  Mitral A-wave peak velocity              68.9  cm/s     ----------  Mitral deceleration time                 194   ms       150 - 230  Mitral peak gradient, D                  3     mm Hg    ----------  Mitral E/A ratio, peak                   1.3            ----------    Tricuspid valve                          Value          Reference  Tricuspid regurg peak velocity           250   cm/s     ----------  Tricuspid peak RV-RA gradient            25    mm Hg    ----------    Right atrium                             Value          Reference  RA ID, S-I, ES, A4C                      47.8  mm       34 - 49  RA area, ES, A4C                         15.2  cm^2     8.3 - 19.5  RA volume, ES, A/L                       36.2  ml       ----------  RA volume/bsa, ES, A/L                   18.2  ml/m^2   ----------  Legend: (L)  and  (H)  mark values outside specified reference range.  ------------------------------------------------------------------- Prepared and Electronically Authenticated by  Shirlee More, MD 2018-11-21T15:49:44   Transesophageal Echocardiography  Patient:    Tarick, Parenteau MR #:       [id number] Study Date: 01/17/2018 Gender:     M Age:  85 Height:     172.7 cm Weight:     80.7 kg BSA:        1.98 m^2 Pt. Status: Room:   [place] [facility]  Gilmer Crenshaw  SONOGRAPHER  Cardell Peach, RDCS  cc:  ------------------------------------------------------------------- LV EF: 50% -   55%  ------------------------------------------------------------------- Indications:      Aortic stenosis 424.1.  ------------------------------------------------------------------- History:   PMH:   Coronary artery disease.  Aortic valve disease. Risk factors:  Diabetes mellitus.  Dyslipidemia.  ------------------------------------------------------------------- Study Conclusions  - Left ventricle: Systolic function was normal. The estimated   ejection fraction was in the range of 50% to 55%. Wall motion was   normal; there were no regional wall motion abnormalities. - Aortic valve: Cusp separation was reduced. There was moderate to   severe stenosis. There was mild regurgitation. Valve area (VTI):   0.73 cm^2. Valve area (Vmax): 0.69 cm^2. Valve area (Vmean): 0.66   cm^2. - Mitral valve: No evidence of vegetation. There was mild   regurgitation. - Left atrium: The atrium was mildly dilated. No evidence of   thrombus in the atrial cavity or appendage. - Right atrium: The atrium was mildly dilated. - Atrial septum: No defect or patent foramen ovale was identified. - Tricuspid valve: No evidence of vegetation. - Pulmonic valve: No evidence of vegetation.  Impressions:  - Low normal LV systolic function; calcified aortic valve with   fixed left and right coronary cusps; moderate to severe AS (mean   gradient 28 mmHg; AVA 0.85 cm2 by planimetry; dimensionless index   .26); mild AI; mild MR; mild biatrial enlargement; mild TR.  ------------------------------------------------------------------- Study data:   Study status:  Routine.  Consent:  The risks, benefits, and alternatives to the procedure were explained to the patient and informed consent was obtained.  Procedure:  Initial setup. The patient was brought to the laboratory. Surface ECG leads were monitored. Sedation. Conscious sedation was administered. Transesophageal echocardiography. Topical anesthesia was obtained using viscous lidocaine. An adult multiplane transesophageal probe was inserted by the attending cardiologist. Image quality was adequate.  Study completion:  The patient tolerated the procedure well. There were no complications.          Diagnostic transesophageal echocardiography.   2D and color Doppler. Birthdate:  Patient birthdate: 1932/11/15.  Age:  Patient is 84 yr old.  Sex:  Gender: male.    BMI: 27 kg/m^2.  Blood pressure: 167/77  Patient status:  Outpatient.  Study date:  Study date: 01/17/2018. Study time: 11:20 AM.  Location:  Endoscopy.  -------------------------------------------------------------------  ------------------------------------------------------------------- Left ventricle:  Systolic function was normal. The estimated ejection fraction was in the range of 50% to 55%. Wall motion was normal; there were no regional wall motion abnormalities.  ------------------------------------------------------------------- Aortic valve:   Trileaflet; severely calcified leaflets. Cusp separation was reduced.  Doppler:   There was moderate to severe stenosis.   There was mild regurgitation.    VTI ratio of LVOT to aortic valve: 0.23. Valve area (VTI): 0.73 cm^2. Indexed valve area (VTI): 0.37 cm^2/m^2. Peak velocity ratio of LVOT to aortic valve: 0.22. Valve area (Vmax): 0.69 cm^2. Indexed valve area (Vmax): 0.35 cm^2/m^2. Mean velocity ratio of LVOT to aortic valve: 0.21. Valve area (Vmean): 0.66 cm^2. Indexed valve area (Vmean): 0.33 cm^2/m^2.    Mean gradient (S): 26 mm Hg. Peak  gradient (S): 47 mm Hg.  ------------------------------------------------------------------- Aorta:  Descending aorta: The descending aorta had moderate diffuse disease.  ------------------------------------------------------------------- Mitral valve:   Structurally normal valve.   Leaflet separation was normal.  No evidence of vegetation.  Doppler:  There was mild regurgitation.  ------------------------------------------------------------------- Left atrium:  The atrium was mildly dilated.  No evidence of thrombus in the atrial cavity or appendage.  ------------------------------------------------------------------- Atrial septum:  No defect or patent foramen  ovale was identified.   ------------------------------------------------------------------- Right ventricle:  The cavity size was normal. Systolic function was normal.  ------------------------------------------------------------------- Pulmonic valve:    Structurally normal valve.   Cusp separation was normal.  No evidence of vegetation.  ------------------------------------------------------------------- Tricuspid valve:   Structurally normal valve.   Leaflet separation was normal.  No evidence of vegetation.  Doppler:  There was mild regurgitation.  ------------------------------------------------------------------- Right atrium:  The atrium was mildly dilated.  ------------------------------------------------------------------- Pericardium:  There was no pericardial effusion.  ------------------------------------------------------------------- Measurements   Left ventricle                           Value  Stroke volume, 2D                        60    ml  Stroke volume/bsa, 2D                    30    ml/m^2    LVOT                                     Value  LVOT ID, S                               20    mm  LVOT area                                3.14  cm^2  LVOT peak velocity, S                    75.1  cm/s  LVOT mean velocity, S                    50.9  cm/s  LVOT VTI, S                              19.2  cm  LVOT peak gradient, S                    2     mm Hg    Aortic valve                             Value  Aortic valve peak velocity, S            341   cm/s  Aortic valve mean velocity, S            241   cm/s  Aortic valve VTI, S                      82.5  cm  Aortic mean gradient, S                  26    mm Hg  Aortic peak gradient, S                  47    mm Hg  VTI ratio, LVOT/AV                       0.23  Aortic valve area, VTI                   0.73  cm^2  Aortic valve area/bsa, VTI               0.37  cm^2/m^2  Velocity ratio, peak, LVOT/AV             0.22  Aortic valve area, peak velocity         0.69  cm^2  Aortic valve area/bsa, peak velocity     0.35  cm^2/m^2  Velocity ratio, mean, LVOT/AV            0.21  Aortic valve area, mean velocity         0.66  cm^2  Aortic valve area/bsa, mean velocity     0.33  cm^2/m^2  Legend: (L)  and  (H)  mark values outside specified reference range.  ------------------------------------------------------------------- Prepared and Electronically Authenticated by  Kirk Ruths 2019-03-01T16:27:44   RIGHT/LEFT HEART CATH AND CORONARY/GRAFT ANGIOGRAPHY  Conclusion     Prox RCA lesion is 100% stenosed.  SVG graft was visualized by angiography and is normal in caliber.  Mid LM to Dist LM lesion is 30% stenosed.  Prox Cx to Dist Cx lesion is 70% stenosed.  SVG graft was not visualized due to inability to cannulate.  Prox LAD lesion is 90% stenosed.  Ost 1st Diag lesion is 90% stenosed.   1. 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 As above, there were no graft markers. I could not locate the SVG to the OM but it is open based on competitive flow seen on antegrade injections in the native Circumflex.  2. Aortic stenosis. Cath data is not consistent with echo data. Mean gradient 9 mmHg, peak to peak gradient 9 mmHg, AVA 2.24 cm2. The TEE images demonstrate movement of one of the leaflets. The mean gradient by TEE was 26 mmHg.   Recommendations: I will review case with Dr. [name]. Clinically he has symptoms c/w severe AS. The DVI by echo in November 2018 was 0.23. Cath data is not c/w severe AS. We will proceed with his scans in workup for TAVR.    Indications   Severe aortic stenosis [I35.0 (ICD-10-CM)]  Procedural Details/Technique   Technical Details Indication: 84 yo male with severe AS, CAD with prior CABG in 2001 in [place]. Unable to get op report. Recent worsened dyspnea and fatigue.   Procedure: The risks, benefits, complications,  treatment options, and expected outcomes were discussed with the patient. The patient and/or family concurred with the proposed plan, giving informed consent. The patient was brought to the cath lab after IV hydration was given. The patient was further sedated with Versed and Fentanyl. There was an IV catheter present in the right antecubital vein. This area was prepped and draped. I changed out this catheter for a 5 French sheath. Right heart cath performed with a balloon tipped catheter. The left wrist was prepped and draped  in a sterile fashion. 1% lidocaine was used for local anesthesia. Using the modified Seldinger access technique, a 5 French sheath was placed in the left radial artery. 3 mg Verapamil was given through the sheath. 4000 units IV heparin was given. Standard diagnostic catheters were used to perform selective coronary angiography. I engaged the LIMA graft, native RCA and the SVG to the RCA with the JR4 Catheter. I engaged the left main with a JL4 catheter. I was unable to engage the vein graft to the OM despite multiple attempts and different catheters. I obtained access in the right femoral artery in an attempt to engage the SVG to the OM. I could not find the graft. The graft is open because competitive flow is seen in the OM branch. The aortic valve was crossed with an AL-1 catheter and a straight wire. LV pressures measured. A pigtail was used to perform an aortic root angiogram but I could not visualize the SVG to the OM. The sheath was removed from the left radial artery and a Terumo hemostasis band was applied at the arteriotomy site on the left wrist. Of note, a long sheath was used from the femoral artery due to tortuosity in the iliac system.     Estimated blood loss <50 mL.  During this procedure the patient was administered the following to achieve and maintain moderate conscious sedation: Versed 2 mg, Fentanyl 50 mcg, while the patient's heart rate, blood pressure, and oxygen  saturation were continuously monitored. The period of conscious sedation was 75 minutes, of which I was present face-to-face 100% of this time.  Complications   Complications documented before study signed (01/31/2018 9:09 AM EDT)    RIGHT/LEFT HEART CATH AND CORONARY/GRAFT ANGIOGRAPHY   None Documented by Burnell Blanks, MD 01/31/2018 9:06 AM EDT  Time Range: Intraprocedure      Coronary Findings   Diagnostic  Dominance: Right  Left Main  Mid LM to Dist LM lesion 30% stenosed  Mid LM to Dist LM lesion is 30% stenosed. The lesion is eccentric.  Left Anterior Descending  Prox LAD lesion 90% stenosed  Prox LAD lesion is 90% stenosed.  First Diagonal Branch  Vessel is small in size.  Ost 1st Diag lesion 90% stenosed  Ost 1st Diag lesion is 90% stenosed.  Left Circumflex  Vessel is moderate in size.  Prox Cx to Dist Cx lesion 70% stenosed  Prox Cx to Dist Cx lesion is 70% stenosed.  First Obtuse Marginal Branch  Vessel is small in size.  Third Obtuse Marginal Branch  Vessel is moderate in size.  Right Coronary Artery  Vessel is large.  Prox RCA lesion 100% stenosed  Prox RCA lesion is 100% stenosed. The lesion is chronically occluded.  saphenous Graft to Dist RCA  SVG graft was visualized by angiography and is normal in caliber.  saphenous Graft to Ost 3rd Mrg  SVG graft was not visualized due to inability to cannulate. I could not find the origin of the graft to the OM but the OM branch fills from antegrade flow and competitive flow is seen from the graft.  LIMA Graft to Mid LAD  Intervention   No interventions have been documented.  Coronary Diagrams   Diagnostic Diagram       Implants    No implant documentation for this case.  MERGE Images   Show images for CARDIAC CATHETERIZATION   Link to Procedure Log   Procedure Log    Hemo Data  Most Recent Value  Fick Cardiac Output 5.62 L/min  Fick Cardiac Output Index 2.91 (L/min)/BSA  Aortic Mean  Gradient 9 mmHg  Aortic Peak Gradient 9 mmHg  Aortic Valve Area 2.24  Aortic Value Area Index 1.16 cm2/BSA  RA A Wave 5 mmHg  RA V Wave 7 mmHg  RA Mean 4 mmHg  RV Systolic Pressure 31 mmHg  RV Diastolic Pressure 0 mmHg  RV EDP 5 mmHg  PA Systolic Pressure 28 mmHg  PA Diastolic Pressure 9 mmHg  PA Mean 16 mmHg  PW A Wave 9 mmHg  PW V Wave 8 mmHg  PW Mean 7 mmHg  AO Systolic Pressure 244 mmHg  AO Diastolic Pressure 62 mmHg  AO Mean 91 mmHg  LV Systolic Pressure 010 mmHg  LV Diastolic Pressure 6 mmHg  LV EDP 19 mmHg  Arterial Occlusion Pressure Extended Systolic Pressure 272 mmHg  Arterial Occlusion Pressure Extended Diastolic Pressure 60 mmHg  Arterial Occlusion Pressure Extended Mean Pressure 87 mmHg  Left Ventricular Apex Extended Systolic Pressure 536 mmHg  Left Ventricular Apex Extended Diastolic Pressure 6 mmHg  Left Ventricular Apex Extended EDP Pressure 17 mmHg  QP/QS 1  TPVR Index 5.5 HRUI  TSVR Index 31.26 HRUI  PVR SVR Ratio 0.1  TPVR/TSVR Ratio 0.18    Cardiac TAVR CT  TECHNIQUE: The patient was scanned on a [employer]. A 120 kV retrospective scan was triggered in the descending thoracic aorta at 111 HU's. Gantry rotation speed was 250 msecs and collimation was .6 mm. No beta blockade or nitro were given. The 3D data set was reconstructed in 5% intervals of the R-R cycle. Systolic and diastolic phases were analyzed on a dedicated work station using MPR, MIP and VRT modes. The patient received 80 cc of contrast.  FINDINGS: Aortic Valve: Trileaflet aortic valve with severely thickened and calcified leaflets and moderately restricted leaflet opening. No calcifications are extending into the LVOT.  Aorta: Normal size of the thoracic aorta with moderate diffuse calcifications and atheroma in the descending aorta. No dissection in the thoracic portion of the aorta.  Sinotubular Junction: 26 x 25 mm  Ascending Thoracic Aorta: 29 x 28  mm  Aortic Arch: 28 x 25 mm  Descending Thoracic Aorta: 27 x 27 mm  Sinus of Valsalva Measurements:  Non-coronary: 30 mm  Right -coronary: 31 mm  Left -coronary: 32 mm  Coronary Artery Height above Annulus:  Left Main: 12 mm  Right Coronary: Occluded.  Virtual Basal Annulus Measurements:  Maximum/Minimum Diameter: 28.6 x 22.9 mm  Mean Diameter: 24.9 mm  Perimeter: 81.9 mm  Area: 487 mm2  Optimum Fluoroscopic Angle for Delivery: RAO 13 CRA 5  IMPRESSION: 1. Trileaflet aortic valve with severely thickened and calcified leaflets and moderately restricted leaflet opening. No calcifications are extending into the LVOT. Annular measurement suitable for delivery of a 26 mm Edwards-SAPIEN 3 valve.  2. Sufficient left main to annulus distance, RCA is occluded.  3. Optimum Fluoroscopic Angle for Delivery:  RAO 13 CRA 5  4. No thrombus in the left atrial appendage.   Electronically Signed   By: Ena Dawley   On: 02/12/2018 16:42   CT ANGIOGRAPHY CHEST, ABDOMEN AND PELVIS  TECHNIQUE: Multidetector CT imaging through the chest, abdomen and pelvis was performed using the standard protocol during bolus administration of intravenous contrast. Multiplanar reconstructed images and MIPs were obtained and reviewed to evaluate the vascular anatomy.  CONTRAST:  44mL ISOVUE-370 IOPAMIDOL (ISOVUE-370) INJECTION 76%  COMPARISON:  None.  FINDINGS: CTA CHEST FINDINGS  Cardiovascular: Heart size is enlarged with left atrial dilatation. There is no significant pericardial fluid, thickening or pericardial calcification. There is aortic atherosclerosis, as well as atherosclerosis of the great vessels of the mediastinum and the coronary arteries, including calcified atherosclerotic plaque in the left main, left anterior descending, left circumflex and right coronary arteries. Status post median sternotomy for CABG including LIMA to the LAD.  Severe thickening calcification of the aortic valve.  Mediastinum/Lymph Nodes: No pathologically enlarged mediastinal or hilar lymph nodes. Large hiatal hernia. No axillary lymphadenopathy.  Lungs/Pleura: Linear areas of scarring in the lung bases bilaterally. No consolidative airspace disease. No pleural effusions. No suspicious appearing pulmonary nodules or masses are noted.  Musculoskeletal/Soft Tissues: Median sternotomy wires. There are no aggressive appearing lytic or blastic lesions noted in the visualized portions of the skeleton.  CTA ABDOMEN AND PELVIS FINDINGS  Hepatobiliary: No suspicious cystic or solid hepatic lesions. No intra or extrahepatic biliary ductal dilatation. Gallbladder is normal in appearance.  Pancreas: No pancreatic mass. No pancreatic ductal dilatation. No pancreatic or peripancreatic fluid or inflammatory changes.  Spleen: Unremarkable.  Adrenals/Urinary Tract: Low-attenuation lesions in both kidneys, compatible with simple cysts, largest of which measures up to 1.7 cm in the lower pole of the left kidney. Extensive cortical thinning throughout the interpolar and upper pole region of the left kidney, particularly posteriorly, presumably sequela of prior renal infarction. Some subcentimeter low-attenuation lesions in the left kidney are too small to definitively characterize, but statistically likely to represent cysts. Bilateral adrenal glands are normal in appearance. No hydroureteronephrosis. Urinary bladder is normal in appearance.  Stomach/Bowel: Intraabdominal portion of the stomach is normal in appearance. No pathologic dilatation of small bowel or colon. Normal appendix.  Vascular/Lymphatic: Aortic atherosclerosis with multifocal ectasia of the infrarenal abdominal aorta which measures up to 2.5 x 2.6 cm. Findings and measurements pertinent to potential TAVR procedure, as detailed below. Small penetrating ulcer in the mid  right common iliac artery (axial image 152 of series 15). Celiac axis, superior mesenteric artery and inferior mesenteric artery are all widely patent without hemodynamically significant stenosis. Single right renal artery is widely patent. On the left, there are 2 small renal arteries, both of which are widely patent without hemodynamically significant stenosis. 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 No lymphadenopathy noted in the abdomen or pelvis.  Reproductive: Prostate gland and seminal vesicles are unremarkable in appearance. Small focal fluid collection in the right inguinal region measuring 2.0 x 3.2 cm is of uncertain etiology and significance, but has a benign appearance, potentially some chronically loculated ascites.  Other: No significant volume of free flowing ascites. No pneumoperitoneum.  Musculoskeletal: There are no aggressive appearing lytic or blastic lesions noted in the visualized portions of the skeleton.  VASCULAR MEASUREMENTS PERTINENT TO TAVR:  AORTA:  Minimal Aortic Diameter-16 x 16 mm  Severity of Aortic Calcification-moderate to severe  RIGHT PELVIS:  Right Common Iliac Artery -  Minimal Diameter-8.9 x 9.5 mm  Tortuosity-mild  Calcification-moderate to severe  Note-small penetrating ulcer in the mid right common iliac artery (axial image 152 of series 15).  Right External Iliac Artery -  Minimal Diameter-8.5 x 8.6 mm  Tortuosity-severe  Calcification-minimal  Right Common Femoral Artery -  Minimal Diameter-8.5 x 9.7 mm  Tortuosity-mild  Calcification-mild  LEFT PELVIS:  Left Common  Iliac Artery -  Minimal Diameter-8.2 x 7.8 mm  Tortuosity-mild  Calcification-moderate to severe  Left External Iliac Artery -  Minimal Diameter-9.9 x  9.0 mm  Tortuosity-moderate  Calcification-minimal  Left Common Femoral Artery -  Minimal Diameter-9.0 x 8.8 mm  Tortuosity-mild  Calcification-mild  Review of the MIP images confirms the above findings.  IMPRESSION: 1. Vascular findings and measurements pertinent to potential TAVR procedure, as detailed above. 2. Severe thickening calcification of the aortic valve, compatible with the reported clinical history of severe aortic stenosis. 3. Large hiatal hernia. This may have implications for real-time monitoring of the procedure by TEE at time of upcoming TAVR. 4. Aortic atherosclerosis, in addition to left main and 3 vessel coronary artery disease. Status post median sternotomy for CABG including LIMA to the LAD. 5. Small penetrating ulcer in the mid right common iliac artery. 6. Additional incidental findings, as above.  Aortic Atherosclerosis (ICD10-I70.0).   Electronically Signed   By: Vinnie Langton M.D.   On: 02/12/2018 09:33    STS Risk Calculator  Procedure: Isolated AVR CALCULATE  Risk of Mortality:  2.517%   Renal Failure:  2.670%   Permanent Stroke:  2.467%   Prolonged Ventilation:  9.169%   DSW Infection:  0.146%   Reoperation:  3.884%   Morbidity or Mortality:  14.417%   Short Length of Stay:  35.572%   Long Length of Stay:  5.899%    Procedure: AVR + CAB CALCULATE  Risk of Mortality:  4.549%   Renal Failure:  4.681%   Permanent Stroke:  2.351%   Prolonged Ventilation:  12.871%   DSW Infection:  0.144%   Reoperation:  5.091%   Morbidity or Mortality:  23.322%   Short Length of Stay:  20.298%   Long Length of Stay:  12.472%       Impression:  Patient has stage D severe symptomatic aortic stenosis.  He presents with gradual decline in exercise tolerance with worsening fatigue and exertional shortness of breath consistent with chronic diastolic congestive heart failure, New York Heart Association functional class I-II.  I  have personally reviewed the patient's recent transthoracic and transesophageal echocardiograms, diagnostic cardiac catheterization, and CT angiograms.  The patient's aortic valve is trileaflet.  There is severe thickening and calcification involving the right coronary leaflet.  There was moderate restriction of the remaining 2 leaflets.  Peak velocity across the aortic valve was reported 3.6 m/s corresponding to mean transvalvular gradient estimated 29 mmHg.  The DVI was reported between 0.24 and 0.26.  By planimetry at TEE the valve area was measured 0.85 cm.  Left ventricular systolic function remains normal.  Catheterization revealed severe native three-vessel coronary artery disease but continued patency of bypass grafts placed at the time of the patient's previous coronary artery bypass surgery.  The vein graft to the left circumflex system were never clearly imaged, but there was evidence of good flow because of competitive flow noted on left coronary injection. Given the fact that the patient's symptoms are mild and the severity of aortic stenosis is now just fairly severe, continued medical therapy with close follow-up remains a reasonable option.  However,  I agree the patient might benefit from elective aortic valve replacement.   Risks associated with conventional surgical aortic valve replacement with or without redo coronary artery bypass grafting would be at least moderately elevated.  Cardiac-gated CTA of the heart reveals anatomical characteristics consistent with aortic stenosis suitable for treatment by transcatheter aortic valve replacement without any significant complicating  features and CTA of the aorta and iliac vessels demonstrate what appears to be adequate pelvic vascular access to facilitate a transfemoral approach.  The patient does have a small infrarenal abdominal aortic aneurysm with a short segment limited dissection in the wall.  However, the vessel size is more than big enough to  allow safe passage of an introducing sheath for transcatheter aortic valve replacement.    Plan:  The patient and his wife counseled at length regarding treatment alternatives for management of moderate-severe symptomatic aortic stenosis. Alternative approaches such as conventional aortic valve replacement, transcatheter aortic valve replacement, and continued medical therapy without intervention were compared and contrasted at length.  The risks associated with conventional surgical aortic valve replacement were discussed in detail, as were expectations for post-operative convalescence, and why I would be reluctant to consider this patient a candidate for conventional surgery.  Issues specific to transcatheter aortic valve replacement were discussed including questions about long term valve durability, the potential for paravalvular leak, possible increased risk of need for permanent pacemaker placement, and other technical complications related to the procedure itself.  Long-term prognosis with medical therapy was discussed. This discussion was placed in the context of the patient's own specific clinical presentation and past medical history.  All of their questions have been addressed.  The patient desires to proceed with transcatheter aortic valve replacement in the near future.  We tentatively plan for surgery on Apr 01, 2018.  Following the decision to proceed with transcatheter aortic valve replacement, a discussion has been held regarding what types of management strategies would be attempted intraoperatively in the event of life-threatening complications, including whether or not the patient would be considered a candidate for the use of cardiopulmonary bypass and/or conversion to open sternotomy for attempted surgical intervention.  The patient has been advised of a variety of complications that might develop including but not limited to risks of death, stroke, paravalvular leak, aortic dissection  or other major vascular complications, aortic annulus rupture, device embolization, cardiac rupture or perforation, mitral regurgitation, acute myocardial infarction, arrhythmia, heart block or bradycardia requiring permanent pacemaker placement, congestive heart failure, respiratory failure, renal failure, pneumonia, infection, other late complications related to structural valve deterioration or migration, or other complications that might ultimately cause a temporary or permanent loss of functional independence or other long term morbidity.  The patient provides full informed consent for the procedure as described and all questions were answered.    I spent in excess of 90 minutes during the conduct of this office consultation and >50% of this time involved direct face-to-face encounter with the patient for counseling and/or coordination of their care.    Valentina Gu. Roxy Manns, MD 03/11/2018 9:49 AM

## 2018-03-11 NOTE — Patient Instructions (Signed)
Continue all previous medications without any changes at this time  

## 2018-03-19 ENCOUNTER — Other Ambulatory Visit: Payer: Self-pay

## 2018-03-19 DIAGNOSIS — I350 Nonrheumatic aortic (valve) stenosis: Secondary | ICD-10-CM

## 2018-03-20 ENCOUNTER — Institutional Professional Consult (permissible substitution) (INDEPENDENT_AMBULATORY_CARE_PROVIDER_SITE_OTHER): Payer: Medicare Other | Admitting: Surgery

## 2018-03-20 ENCOUNTER — Other Ambulatory Visit: Payer: Self-pay

## 2018-03-20 ENCOUNTER — Encounter: Payer: Self-pay | Admitting: Surgery

## 2018-03-20 VITALS — BP 144/77 | HR 59 | Resp 18 | Ht 68.0 in | Wt 169.0 lb

## 2018-03-20 DIAGNOSIS — I350 Nonrheumatic aortic (valve) stenosis: Secondary | ICD-10-CM | POA: Diagnosis not present

## 2018-03-20 DIAGNOSIS — I2583 Coronary atherosclerosis due to lipid rich plaque: Secondary | ICD-10-CM

## 2018-03-20 DIAGNOSIS — I2510 Atherosclerotic heart disease of native coronary artery without angina pectoris: Secondary | ICD-10-CM | POA: Diagnosis not present

## 2018-03-20 NOTE — Progress Notes (Deleted)
HPI: FU CAD and AS. Patient is status post coronary artery bypass graft in [place] in 2001. He also had ablation of what sounds to be SVT at that time. Last echocardiogram November 2018 showed normal LV function, grade 2 diastolic dysfunction, moderate to severe aortic stenosis with mean gradient recorded at 29 mmHg, mild aortic and mitral regurgitation and mild left atrial enlargement.  Abdominal ultrasound January 2019 showed abdominal aortic aneurysm measuring 3.9 cm.  TEE 3/19 showed EF 50-55, moderate to severe AS with mean gradient of 28 mmHg, AVA 0.85 cm2 by planimetry, mild AI, mild MR, mild biatrial enlargement, mild TR. Cath 3/19 showed severe 3 vessel CAD 3/3 grafts patent, AVA 2.24 cm2. Carotid dopplers 3/19 showed 1-39 bilateral stenosis. CTA 3/19 showed small penetrating ulcer right common iliac artery. Pt scheduled for TAVR. Since last seen,   Current Outpatient Medications  Medication Sig Dispense Refill  . amLODipine (NORVASC) 10 MG tablet TAKE 1/2 TABLET BY MOUTH DAILY 45 tablet 1  . aspirin 81 MG tablet Take 81 mg by mouth daily.    Marland Kitchen atorvastatin (LIPITOR) 80 MG tablet Take 0.5 tablets (40 mg total) by mouth at bedtime. 45 tablet 1  . glucose blood (BAYER CONTOUR TEST) test strip TEST ONCE DAILY FOR DIABETES Dx Code: E11.9 100 each 11  . glyBURIDE (DIABETA) 5 MG tablet TAKE 1/2 (ONE-HALF) TABLET BY MOUTH TWICE DAILY WITH MEALS 90 tablet 1  . losartan (COZAAR) 100 MG tablet TAKE 1/2 TABLET BY MOUTH 2 TIMES A DAY 90 tablet 3  . metFORMIN (GLUCOPHAGE) 500 MG tablet TAKE 2 TABLETS BY MOUTH EVERY MORNING THEN TAKE 1 TABLET BY MOUTH EVERY EVENING 270 tablet 1  . spironolactone (ALDACTONE) 25 MG tablet Take 0.5 tablets (12.5 mg total) by mouth daily. 45 tablet 1   No current facility-administered medications for this visit.      Past Medical History:  Diagnosis Date  . Aortic aneurysm ([place])   . Aortic stenosis   . CAD (coronary artery disease)   . Diabetes mellitus type  II, controlled ([place])   . Heart disease    Ablation  . History of chicken pox   . Hyperlipidemia   . Hypertension   . S/P CABG x 4    2001 - [place]  . Umbilical hernia     Past Surgical History:  Procedure Laterality Date  . ABLATION     Rhythm problem; rhythm unknown; [place] GRAFT  2001  . EXPLORATION POST OPERATIVE OPEN HEART  2001, June  . RIGHT/LEFT HEART CATH AND CORONARY/GRAFT ANGIOGRAPHY N/A 01/31/2018   Procedure: RIGHT/LEFT HEART CATH AND CORONARY/GRAFT ANGIOGRAPHY;  Surgeon: Burnell Blanks, MD;  Location: [facility];  Service: Cardiovascular;  Laterality: N/A;  . TEE WITHOUT CARDIOVERSION N/A 01/17/2018   Procedure: TRANSESOPHAGEAL ECHOCARDIOGRAM (TEE);  Surgeon: Lelon Perla, MD;  Location: [facility] ENDOSCOPY;  Service: Cardiovascular;  Laterality: N/A;  . TONSILLECTOMY    . UMBILICAL HERNIA REPAIR  2015    Social History   Socioeconomic History  . Marital status: Married    Spouse name: Not on file  . Number of children: 2  . Years of education: Not on file  . Highest education level: Not on file  Occupational History  . Not on file  Social Needs  . Financial resource strain: Not on file  . Food insecurity:    Worry: Not on file    Inability: Not on file  .  Transportation needs:    Medical: Not on file    Non-medical: Not on file  Tobacco Use  . Smoking status: Former Smoker    Packs/day: 1.00    Years: 3.00    Pack years: 3.00  . Smokeless tobacco: Never Used  Substance and Sexual Activity  . Alcohol use: No    Alcohol/week: 0.0 oz  . Drug use: No  . Sexual activity: Not on file  Lifestyle  . Physical activity:    Days per week: Not on file    Minutes per session: Not on file  . Stress: Not on file  Relationships  . Social connections:    Talks on phone: Not on file    Gets together: Not on file    Attends religious service: Not on file    Active member of club or organization: Not on file     Attends meetings of clubs or organizations: Not on file    Relationship status: Not on file  . Intimate partner violence:    Fear of current or ex partner: Not on file    Emotionally abused: Not on file    Physically abused: Not on file    Forced sexual activity: Not on file  Other Topics Concern  . Not on file  Social History Narrative  . Not on file    Family History  Problem Relation Age of Onset  . Diabetes Mother 34       Deceased  . Heart disease Father 2       Deceased  . Heart disease Maternal Uncle   . Diabetes Maternal Uncle   . Heart disease Brother   . Diabetes Brother   . Diabetes Sister        #1  . Heart disease Sister        #2  . Hyperlipidemia Son        x2    ROS: no fevers or chills, productive cough, hemoptysis, dysphasia, odynophagia, melena, hematochezia, dysuria, hematuria, rash, seizure activity, orthopnea, PND, pedal edema, claudication. Remaining systems are negative.  Physical Exam: Well-developed well-nourished in no acute distress.  Skin is warm and dry.  HEENT is normal.  Neck is supple.  Chest is clear to auscultation with normal expansion.  Cardiovascular exam is regular rate and rhythm.  Abdominal exam nontender or distended. No masses palpated. Extremities show no edema. neuro grossly intact  ECG- personally reviewed  A/P  1  Kirk Ruths, MD

## 2018-03-20 NOTE — Patient Instructions (Signed)
You are scheduled for Pre Admission Testing on Friday, Mar 28, 2018 at 10:00 AM.  Please arrive in Admitting at [facility] (Main Entrance A, Valet Parking) at 9:45 AM for check-in. No restrictions for this appointment.  04/01/2018 TAVR:  Please arrive in Admitting at [facility] at 5:30 AM for check-in.  Have nothing to eat or drink after midnight the night before surgery.  Continue taking all current medications without change through the day before surgery with the following exceptions: Stop Metformin on 03/30/2018, you will take your last dose on 03/29/2018.   On the morning of surgery do not take any medications

## 2018-03-21 ENCOUNTER — Encounter: Payer: Self-pay | Admitting: Surgery

## 2018-03-21 NOTE — Progress Notes (Signed)
Patient ID: [name], male   DOB: 10-16-1932, 84 y.o.   MRN: [id number]  [place] SURGERY CONSULTATION REPORT  Referring Provider is Crenshaw, Denice Bors, MD PCP is Copland, Gay Filler, MD  Chief Complaint  Patient presents with  . Aortic Stenosis    2nd TAVR consultation    HPI:  The patient is an 84 year old gentleman with history of diabetes, hypertension, hyperlipidemia, coronary artery disease status post coronary bypass graft surgery,  postoperative SVT status post ablation, and aortic stenosis who is being evaluated for consideration of transcatheter aortic valve replacement.  He presented in 2001 with symptomatic tachycardia and was diagnosed with SVT while living in [place].  He had an abnormal nuclear stress test and underwent coronary bypass graft surgery x4.  He said that he underwent an ablation about 1 month later and has had no further arrhythmias.  He was noted to have a heart murmur and was diagnosed with aortic stenosis which was followed by echocardiogram.  He moved to [place] about 4 years ago and has been followed by Dr. [name].  Serial echocardiograms of shown progression of aortic stenosis.  He has remained fairly active but has noted that his exertional tolerance has decreased over the past 6 months or so.  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  He has had no symptoms with ordinary activity around the house.  He denies any resting symptoms.  He has had no chest pain.  He has noted some dizziness when he goes from supine or sitting to standing which takes a couple minutes to clear.  He denies any peripheral edema.  A  follow-up echocardiogram on 10/09/2017 showed progression of his aortic stenosis with a peak velocity across aortic valve of 3.6 m/s and mean gradient of 29 mmHg.  The dimensionless index was 0.24.  Ventricular  ejection fraction was 55 to 60%.  He subsequently underwent a TEE to evaluate the valve more closely and this showed the aortic valve area by planimetry to be 0.85 cm.  The dimensionless index was measured at 0.22.  Mean gradient was 26 mmHg with a peak gradient of 47 mmHg.  Left ventricular ejection fraction remained normal at 50 to 55% with mild aortic insufficiency and mild mitral regurgitation.  He was subsequently evaluated by Dr. Angelena Form and underwent cardiac catheterization showing continued patency of his previous bypass grafts with severe native three-vessel coronary disease.  The right coronary was occluded proximally.  There is 70% left circumflex and 90% proximal LAD stenosis.  The patient is here with his wife today.  He is married and lives in [place].  He is a retired Chief Financial Officer.  Past Medical History:  Diagnosis Date  . Aortic aneurysm ([place])   . Aortic stenosis   . CAD (coronary artery disease)   . Diabetes mellitus type II, controlled ([place])   . Heart disease    Ablation  . History of chicken pox   . Hyperlipidemia   . Hypertension   . S/P CABG x 4    2001 - [place]  . Umbilical hernia     Past Surgical History:  Procedure Laterality Date  . ABLATION     Rhythm problem; rhythm unknown; [place] GRAFT  2001  . EXPLORATION POST OPERATIVE OPEN HEART  2001, June  . RIGHT/LEFT HEART CATH AND CORONARY/GRAFT ANGIOGRAPHY  N/A 01/31/2018   Procedure: RIGHT/LEFT HEART CATH AND CORONARY/GRAFT ANGIOGRAPHY;  Surgeon: Burnell Blanks, MD;  Location: [facility];  Service: Cardiovascular;  Laterality: N/A;  . TEE WITHOUT CARDIOVERSION N/A 01/17/2018   Procedure: TRANSESOPHAGEAL ECHOCARDIOGRAM (TEE);  Surgeon: Lelon Perla, MD;  Location: [facility] ENDOSCOPY;  Service: Cardiovascular;  Laterality: N/A;  . TONSILLECTOMY    . UMBILICAL HERNIA REPAIR  2015    Family History  Problem Relation Age of Onset  . Diabetes Mother 51        Deceased  . Heart disease Father 76       Deceased  . Heart disease Maternal Uncle   . Diabetes Maternal Uncle   . Heart disease Brother   . Diabetes Brother   . Diabetes Sister        #1  . Heart disease Sister        #2  . Hyperlipidemia Son        x2    Social History   Socioeconomic History  . Marital status: Married    Spouse name: Not on file  . Number of children: 2  . Years of education: Not on file  . Highest education level: Not on file  Occupational History  . Not on file  Social Needs  . Financial resource strain: Not on file  . Food insecurity:    Worry: Not on file    Inability: Not on file  . Transportation needs:    Medical: Not on file    Non-medical: Not on file  Tobacco Use  . Smoking status: Former Smoker    Packs/day: 1.00    Years: 3.00    Pack years: 3.00  . Smokeless tobacco: Never Used  Substance and Sexual Activity  . Alcohol use: No    Alcohol/week: 0.0 oz  . Drug use: No  . Sexual activity: Not on file  Lifestyle  . Physical activity:    Days per week: Not on file    Minutes per session: Not on file  . Stress: Not on file  Relationships  . Social connections:    Talks on phone: Not on file    Gets together: Not on file    Attends religious service: Not on file    Active member of club or organization: Not on file    Attends meetings of clubs or organizations: Not on file    Relationship status: Not on file  . Intimate partner violence:    Fear of current or ex partner: Not on file    Emotionally abused: Not on file    Physically abused: Not on file    Forced sexual activity: Not on file  Other Topics Concern  . Not on file  Social History Narrative  . Not on file    Current Outpatient Medications  Medication Sig Dispense Refill  . amLODipine (NORVASC) 10 MG tablet TAKE 1/2 TABLET BY MOUTH DAILY 45 tablet 1  . aspirin 81 MG tablet Take 81 mg by mouth daily.    Marland Kitchen atorvastatin (LIPITOR) 80 MG tablet Take 0.5 tablets (40  mg total) by mouth at bedtime. 45 tablet 1  . glucose blood (BAYER CONTOUR TEST) test strip TEST ONCE DAILY FOR DIABETES Dx Code: E11.9 100 each 11  . glyBURIDE (DIABETA) 5 MG tablet TAKE 1/2 (ONE-HALF) TABLET BY MOUTH TWICE DAILY WITH MEALS 90 tablet 1  . losartan (COZAAR) 100 MG tablet TAKE 1/2 TABLET BY MOUTH 2 TIMES A DAY 90 tablet  3  . metFORMIN (GLUCOPHAGE) 500 MG tablet TAKE 2 TABLETS BY MOUTH EVERY MORNING THEN TAKE 1 TABLET BY MOUTH EVERY EVENING 270 tablet 1  . spironolactone (ALDACTONE) 25 MG tablet Take 0.5 tablets (12.5 mg total) by mouth daily. 45 tablet 1   No current facility-administered medications for this visit.     No Known Allergies    Review of Systems:   General:  Normal appetite, decreased energy, no weight gain, no weight loss, no fever  Cardiac:  no chest pain with exertion, no chest pain at rest,  +SOB with mild exertion, no resting SOB, no PND, no orthopnea, no palpitations, no arrhythmia, no atrial fibrillation, no LE edema, + dizzy spells, no syncope  Respiratory:  exertional shortness of breath, no home oxygen, no productive cough, no dry cough, no bronchitis, no wheezing, no hemoptysis, no asthma, no pain with inspiration or cough, no sleep apnea, no CPAP at night  GI:   no difficulty swallowing, no reflux, no frequent heartburn, no hiatal hernia, no abdominal pain, no constipation, no diarrhea, no hematochezia, no hematemesis, no melena  GU:   no dysuria,  no frequency, no urinary tract infection, no hematuria, no enlarged prostate, no kidney stones, no kidney disease  Vascular:  no pain suggestive of claudication, no pain in feet, no leg cramps, no varicose veins, no DVT, no non-healing foot ulcer  Neuro:   no stroke, no TIA's, no seizures, no headaches, no temporary blindness one eye,  no slurred speech, no peripheral neuropathy, no chronic pain, no instability of gait, no memory/cognitive dysfunction  Musculoskeletal: no arthritis, no joint swelling, no  myalgias, no difficulty walking, normal mobility   Skin:   no rash, no itching, no skin infections, no pressure sores or ulcerations  Psych:   no anxiety, no depression, no nervousness, no unusual recent stress  Eyes:   no blurry vision, no floaters, no recent vision changes, + wears glasses  ENT:   no hearing loss, no loose or painful teeth, no dentures, last saw dentist Jan 2019  Hematologic:  no easy bruising, no abnormal bleeding, no clotting disorder, no frequent epistaxis  Endocrine:  + diabetes, does check CBG's at home      Physical Exam:   BP (!) 144/77 (BP Location: Right Arm, Patient Position: Sitting, Cuff Size: Normal)   Pulse (!) 59   Resp 18   Ht 5\' 8"  (1.727 m)   Wt 169 lb (76.7 kg)   SpO2 97% Comment: RA  BMI 25.70 kg/m    General:  Elderly but well-appearing and looks good for his age.  HEENT:  Unremarkable, NCAT, PERLA, EOMI, oropharynx clear  Neck:   no JVD, no bruits, no adenopathy or thyromegaly  Chest:   clear to auscultation, symmetrical breath sounds, no wheezes, no rhonchi   CV:   RRR, grade III/VI crescendo/decrescendo murmur heard best at RSB,  no diastolic murmur  Abdomen:  soft, non-tender, no masses or organomegaly  Extremities:  warm, well-perfused, pulses palpable, no LE edema  Rectal/GU  Deferred  Neuro:   Grossly non-focal and symmetrical throughout  Skin:   Clean and dry, no rashes, no breakdown   Diagnostic Tests:                             [facility]*                   *[facility]*  [address], [place] [address]                            [phone]  ------------------------------------------------------------------- Transesophageal Echocardiography  Patient:    Vertis, Scheib MR #:       [id number] Study Date: 01/17/2018 Gender:     M Age:        67 Height:     172.7 cm Weight:     80.7 kg BSA:        1.98 m^2 Pt. Status: Room:   [place] Crenshaw  [facility] Crenshaw  SONOGRAPHER  Cardell Peach, RDCS  cc:  ------------------------------------------------------------------- LV EF: 50% -   55%  ------------------------------------------------------------------- Indications:      Aortic stenosis 424.1.  ------------------------------------------------------------------- History:   PMH:   Coronary artery disease.  Aortic valve disease. Risk factors:  Diabetes mellitus. Dyslipidemia.  ------------------------------------------------------------------- Study Conclusions  - Left ventricle: Systolic function was normal. The estimated   ejection fraction was in the range of 50% to 55%. Wall motion was   normal; there were no regional wall motion abnormalities. - Aortic valve: Cusp separation was reduced. There was moderate to   severe stenosis. There was mild regurgitation. Valve area (VTI):   0.73 cm^2. Valve area (Vmax): 0.69 cm^2. Valve area (Vmean): 0.66   cm^2. - Mitral valve: No evidence of vegetation. There was mild   regurgitation. - Left atrium: The atrium was mildly dilated. No evidence of   thrombus in the atrial cavity or appendage. - Right atrium: The atrium was mildly dilated. - Atrial septum: No defect or patent foramen ovale was identified. - Tricuspid valve: No evidence of vegetation. - Pulmonic valve: No evidence of vegetation.  Impressions:  - Low normal LV systolic function; calcified aortic valve with   fixed left and right coronary cusps; moderate to severe AS (mean   gradient 28 mmHg; AVA 0.85 cm2 by planimetry; dimensionless index   .26); mild AI; mild MR; mild biatrial enlargement; mild TR.  ------------------------------------------------------------------- Study data:   Study status:  Routine.  Consent:  The risks, benefits, and alternatives to the procedure were explained to  the patient and informed consent was obtained.  Procedure:  Initial setup. The patient was brought to the laboratory. Surface ECG leads were monitored. Sedation. Conscious sedation was administered. Transesophageal echocardiography. Topical anesthesia was obtained using viscous lidocaine. An adult multiplane transesophageal probe was inserted by the attending cardiologist. Image quality was adequate.  Study completion:  The patient tolerated the procedure well. There were no complications.          Diagnostic transesophageal echocardiography.  2D and color Doppler. Birthdate:  Patient birthdate: Nov 24, 1931.  Age:  Patient is 84 yr old.  Sex:  Gender: male.    BMI: 27 kg/m^2.  Blood pressure: 167/77  Patient status:  Outpatient.  Study date:  Study date: 01/17/2018. Study time: 11:20 AM.  Location:  Endoscopy.  -------------------------------------------------------------------  ------------------------------------------------------------------- Left ventricle:  Systolic function was normal. The estimated ejection fraction was in the range of 50% to 55%. Wall motion was normal; there were no regional wall motion abnormalities.  ------------------------------------------------------------------- Aortic valve:  Trileaflet; severely calcified leaflets. Cusp separation was reduced.  Doppler:   There was moderate to severe stenosis.   There was mild regurgitation.    VTI ratio of LVOT to aortic valve: 0.23. Valve area (VTI): 0.73 cm^2. Indexed valve area (VTI): 0.37 cm^2/m^2. Peak velocity ratio of LVOT to aortic valve: 0.22. Valve area (Vmax): 0.69 cm^2. Indexed valve area (Vmax): 0.35 cm^2/m^2. Mean velocity ratio of LVOT to aortic valve: 0.21. Valve area (Vmean): 0.66 cm^2. Indexed valve area (Vmean): 0.33 cm^2/m^2.    Mean gradient (S): 26 mm Hg. Peak gradient (S): 47 mm Hg.  ------------------------------------------------------------------- Aorta:  Descending aorta: The  descending aorta had moderate diffuse disease.  ------------------------------------------------------------------- Mitral valve:   Structurally normal valve.   Leaflet separation was normal.  No evidence of vegetation.  Doppler:  There was mild regurgitation.  ------------------------------------------------------------------- Left atrium:  The atrium was mildly dilated.  No evidence of thrombus in the atrial cavity or appendage.  ------------------------------------------------------------------- Atrial septum:  No defect or patent foramen ovale was identified.   ------------------------------------------------------------------- Right ventricle:  The cavity size was normal. Systolic function was normal.  ------------------------------------------------------------------- Pulmonic valve:    Structurally normal valve.   Cusp separation was normal.  No evidence of vegetation.  ------------------------------------------------------------------- Tricuspid valve:   Structurally normal valve.   Leaflet separation was normal.  No evidence of vegetation.  Doppler:  There was mild regurgitation.  ------------------------------------------------------------------- Right atrium:  The atrium was mildly dilated.  ------------------------------------------------------------------- Pericardium:  There was no pericardial effusion.  ------------------------------------------------------------------- Measurements   Left ventricle                           Value  Stroke volume, 2D                        60    ml  Stroke volume/bsa, 2D                    30    ml/m^2    LVOT                                     Value  LVOT ID, S                               20    mm  LVOT area                                3.14  cm^2  LVOT peak velocity, S                    75.1  cm/s  LVOT mean velocity, S                    50.9  cm/s  LVOT VTI, S                              19.2  cm  LVOT  peak gradient, S                    2     mm Hg    Aortic valve  Value  Aortic valve peak velocity, S            341   cm/s  Aortic valve mean velocity, S            241   cm/s  Aortic valve VTI, S                      82.5  cm  Aortic mean gradient, S                  26    mm Hg  Aortic peak gradient, S                  47    mm Hg  VTI ratio, LVOT/AV                       0.23  Aortic valve area, VTI                   0.73  cm^2  Aortic valve area/bsa, VTI               0.37  cm^2/m^2  Velocity ratio, peak, LVOT/AV            0.22  Aortic valve area, peak velocity         0.69  cm^2  Aortic valve area/bsa, peak velocity     0.35  cm^2/m^2  Velocity ratio, mean, LVOT/AV            0.21  Aortic valve area, mean velocity         0.66  cm^2  Aortic valve area/bsa, mean velocity     0.33  cm^2/m^2  Legend: (L)  and  (H)  mark values outside specified reference range.  ------------------------------------------------------------------- Prepared and Electronically Authenticated by  Kirk Ruths 2019-03-01T16:27:44   Physicians   Panel Physicians Referring Physician Case Authorizing Physician  Burnell Blanks, MD (Primary)    Procedures   RIGHT/LEFT HEART CATH AND CORONARY/GRAFT ANGIOGRAPHY  Conclusion     Prox RCA lesion is 100% stenosed.  SVG graft was visualized by angiography and is normal in caliber.  Mid LM to Dist LM lesion is 30% stenosed.  Prox Cx to Dist Cx lesion is 70% stenosed.  SVG graft was not visualized due to inability to cannulate.  Prox LAD lesion is 90% stenosed.  Ost 1st Diag lesion is 90% stenosed.   1. 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 As above, there were no graft markers. I could not locate the SVG to the OM but it is open based on competitive flow seen on antegrade injections in the native Circumflex.  2. Aortic stenosis. Cath data is not consistent with echo data.  Mean gradient 9 mmHg, peak to peak gradient 9 mmHg, AVA 2.24 cm2. The TEE images demonstrate movement of one of the leaflets. The mean gradient by TEE was 26 mmHg.   Recommendations: I will review case with Dr. [name]. Clinically he has symptoms c/w severe AS. The DVI by echo in November 2018 was 0.23. Cath data is not c/w severe AS. We will proceed with his scans in workup for TAVR.    Indications   Severe aortic stenosis [I35.0 (ICD-10-CM)]  Procedural Details/Technique   Technical Details Indication: 84 yo male with severe AS, CAD with prior CABG in 2001 in [place]. Unable to get op report. Recent worsened dyspnea and fatigue.  Procedure: The risks, benefits, complications, treatment options, and expected outcomes were discussed with the patient. The patient and/or family concurred with the proposed plan, giving informed consent. The patient was brought to the cath lab after IV hydration was given. The patient was further sedated with Versed and Fentanyl. There was an IV catheter present in the right antecubital vein. This area was prepped and draped. I changed out this catheter for a 5 French sheath. Right heart cath performed with a balloon tipped catheter. The left wrist was prepped and draped in a sterile fashion. 1% lidocaine was used for local anesthesia. Using the modified Seldinger access technique, a 5 French sheath was placed in the left radial artery. 3 mg Verapamil was given through the sheath. 4000 units IV heparin was given. Standard diagnostic catheters were used to perform selective coronary angiography. I engaged the LIMA graft, native RCA and the SVG to the RCA with the JR4 Catheter. I engaged the left main with a JL4 catheter. I was unable to engage the vein graft to the OM despite multiple attempts and different catheters. I obtained access in the right femoral artery in an attempt to engage the SVG to the OM. I could not find the graft. The graft is open because competitive  flow is seen in the OM branch. The aortic valve was crossed with an AL-1 catheter and a straight wire. LV pressures measured. A pigtail was used to perform an aortic root angiogram but I could not visualize the SVG to the OM. The sheath was removed from the left radial artery and a Terumo hemostasis band was applied at the arteriotomy site on the left wrist. Of note, a long sheath was used from the femoral artery due to tortuosity in the iliac system.     Estimated blood loss <50 mL.  During this procedure the patient was administered the following to achieve and maintain moderate conscious sedation: Versed 2 mg, Fentanyl 50 mcg, while the patient's heart rate, blood pressure, and oxygen saturation were continuously monitored. The period of conscious sedation was 75 minutes, of which I was present face-to-face 100% of this time.  Complications   Complications documented before study signed (01/31/2018 9:09 AM EDT)    RIGHT/LEFT HEART CATH AND CORONARY/GRAFT ANGIOGRAPHY   None Documented by Burnell Blanks, MD 01/31/2018 9:06 AM EDT  Time Range: Intraprocedure      Coronary Findings   Diagnostic  Dominance: Right  Left Main  Mid LM to Dist LM lesion 30% stenosed  Mid LM to Dist LM lesion is 30% stenosed. The lesion is eccentric.  Left Anterior Descending  Prox LAD lesion 90% stenosed  Prox LAD lesion is 90% stenosed.  First Diagonal Branch  Vessel is small in size.  Ost 1st Diag lesion 90% stenosed  Ost 1st Diag lesion is 90% stenosed.  Left Circumflex  Vessel is moderate in size.  Prox Cx to Dist Cx lesion 70% stenosed  Prox Cx to Dist Cx lesion is 70% stenosed.  First Obtuse Marginal Branch  Vessel is small in size.  Third Obtuse Marginal Branch  Vessel is moderate in size.  Right Coronary Artery  Vessel is large.  Prox RCA lesion 100% stenosed  Prox RCA lesion is 100% stenosed. The lesion is chronically occluded.  saphenous Graft to Dist RCA  SVG graft was  visualized by angiography and is normal in caliber.  saphenous Graft to Ost 3rd Mrg  SVG graft was not visualized due to inability to cannulate. I  could not find the origin of the graft to the OM but the OM branch fills from antegrade flow and competitive flow is seen from the graft.  LIMA Graft to Mid LAD  Intervention   No interventions have been documented.  Coronary Diagrams   Diagnostic Diagram       Implants    No implant documentation for this case.  MERGE Images   Show images for CARDIAC CATHETERIZATION   Link to Procedure Log   Procedure Log    Hemo Data    Most Recent Value  Fick Cardiac Output 5.62 L/min  Fick Cardiac Output Index 2.91 (L/min)/BSA  Aortic Mean Gradient 9 mmHg  Aortic Peak Gradient 9 mmHg  Aortic Valve Area 2.24  Aortic Value Area Index 1.16 cm2/BSA  RA A Wave 5 mmHg  RA V Wave 7 mmHg  RA Mean 4 mmHg  RV Systolic Pressure 31 mmHg  RV Diastolic Pressure 0 mmHg  RV EDP 5 mmHg  PA Systolic Pressure 28 mmHg  PA Diastolic Pressure 9 mmHg  PA Mean 16 mmHg  PW A Wave 9 mmHg  PW V Wave 8 mmHg  PW Mean 7 mmHg  AO Systolic Pressure 751 mmHg  AO Diastolic Pressure 62 mmHg  AO Mean 91 mmHg  LV Systolic Pressure 025 mmHg  LV Diastolic Pressure 6 mmHg  LV EDP 19 mmHg  Arterial Occlusion Pressure Extended Systolic Pressure 852 mmHg  Arterial Occlusion Pressure Extended Diastolic Pressure 60 mmHg  Arterial Occlusion Pressure Extended Mean Pressure 87 mmHg  Left Ventricular Apex Extended Systolic Pressure 778 mmHg  Left Ventricular Apex Extended Diastolic Pressure 6 mmHg  Left Ventricular Apex Extended EDP Pressure 17 mmHg  QP/QS 1  TPVR Index 5.5 HRUI  TSVR Index 31.26 HRUI  PVR SVR Ratio 0.1  TPVR/TSVR Ratio 0.18    ADDENDUM REPORT: 02/12/2018 16:42  CLINICAL DATA:  84 year old male with severe aortic stenosis being evaluated for a TAVR procedure.  EXAM: Cardiac TAVR CT  TECHNIQUE: The patient was scanned on a [employer] . A 120 kV retrospective scan was triggered in the descending thoracic aorta at 111 HU's. Gantry rotation speed was 250 msecs and collimation was .6 mm. No beta blockade or nitro were given. The 3D data set was reconstructed in 5% intervals of the R-R cycle. Systolic and diastolic phases were analyzed on a dedicated work station using MPR, MIP and VRT modes. The patient received 80 cc of contrast.  FINDINGS: Aortic Valve: Trileaflet aortic valve with severely thickened and calcified leaflets and moderately restricted leaflet opening. No calcifications are extending into the LVOT.  Aorta: Normal size of the thoracic aorta with moderate diffuse calcifications and atheroma in the descending aorta. No dissection in the thoracic portion of the aorta.  Sinotubular Junction: 26 x 25 mm  Ascending Thoracic Aorta: 29 x 28 mm  Aortic Arch: 28 x 25 mm  Descending Thoracic Aorta: 27 x 27 mm  Sinus of Valsalva Measurements:  Non-coronary: 30 mm  Right -coronary: 31 mm  Left -coronary: 32 mm  Coronary Artery Height above Annulus:  Left Main: 12 mm  Right Coronary: Occluded.  Virtual Basal Annulus Measurements:  Maximum/Minimum Diameter: 28.6 x 22.9 mm  Mean Diameter: 24.9 mm  Perimeter: 81.9 mm  Area: 487 mm2  Optimum Fluoroscopic Angle for Delivery: RAO 13 CRA 5  IMPRESSION: 1. Trileaflet aortic valve with severely thickened and calcified leaflets and moderately restricted leaflet opening. No calcifications are extending into the LVOT. Annular measurement suitable for  delivery of a 26 mm Edwards-SAPIEN 3 valve.  2. Sufficient left main to annulus distance, RCA is occluded.  3. Optimum Fluoroscopic Angle for Delivery:  RAO 13 CRA 5  4. No thrombus in the left atrial appendage.   Electronically Signed   By: Ena Dawley   On: 02/12/2018 16:42   Addended by Dorothy Spark, MD on 02/12/2018 4:44 PM    Study Result    EXAM: OVER-READ INTERPRETATION  CT CHEST  The following report is an over-read performed by radiologist Dr. Vinnie Langton of [facility], [place] on 02/12/2018. This over-read does not include interpretation of cardiac or coronary anatomy or pathology. The coronary CTA interpretation by the cardiologist is attached.  COMPARISON:  None.  FINDINGS: Extracardiac findings will be described separately under dictation for contemporaneously obtained CTA chest, abdomen and pelvis.  IMPRESSION: Please see separate dictation for contemporaneously obtained CTA chest, abdomen and pelvis for full description of relevant extracardiac findings.  Electronically Signed: By: Vinnie Langton M.D. On: 02/12/2018 09:11       CLINICAL DATA:  84 year old male with history of severe aortic stenosis. Preprocedural study prior to potential transcatheter aortic valve replacement (TAVR) procedure.  EXAM: CT ANGIOGRAPHY CHEST, ABDOMEN AND PELVIS  TECHNIQUE: Multidetector CT imaging through the chest, abdomen and pelvis was performed using the standard protocol during bolus administration of intravenous contrast. Multiplanar reconstructed images and MIPs were obtained and reviewed to evaluate the vascular anatomy.  CONTRAST:  47mL ISOVUE-370 IOPAMIDOL (ISOVUE-370) INJECTION 76%  COMPARISON:  None.  FINDINGS: CTA CHEST FINDINGS  Cardiovascular: Heart size is enlarged with left atrial dilatation. There is no significant pericardial fluid, thickening or pericardial calcification. There is aortic atherosclerosis, as well as atherosclerosis of the great vessels of the mediastinum and the coronary arteries, including calcified atherosclerotic plaque in the left main, left anterior descending, left circumflex and right coronary arteries. Status post median sternotomy for CABG including LIMA to the LAD. Severe thickening calcification of the aortic valve.  Mediastinum/Lymph  Nodes: No pathologically enlarged mediastinal or hilar lymph nodes. Large hiatal hernia. No axillary lymphadenopathy.  Lungs/Pleura: Linear areas of scarring in the lung bases bilaterally. No consolidative airspace disease. No pleural effusions. No suspicious appearing pulmonary nodules or masses are noted.  Musculoskeletal/Soft Tissues: Median sternotomy wires. There are no aggressive appearing lytic or blastic lesions noted in the visualized portions of the skeleton.  CTA ABDOMEN AND PELVIS FINDINGS  Hepatobiliary: No suspicious cystic or solid hepatic lesions. No intra or extrahepatic biliary ductal dilatation. Gallbladder is normal in appearance.  Pancreas: No pancreatic mass. No pancreatic ductal dilatation. No pancreatic or peripancreatic fluid or inflammatory changes.  Spleen: Unremarkable.  Adrenals/Urinary Tract: Low-attenuation lesions in both kidneys, compatible with simple cysts, largest of which measures up to 1.7 cm in the lower pole of the left kidney. Extensive cortical thinning throughout the interpolar and upper pole region of the left kidney, particularly posteriorly, presumably sequela of prior renal infarction. Some subcentimeter low-attenuation lesions in the left kidney are too small to definitively characterize, but statistically likely to represent cysts. Bilateral adrenal glands are normal in appearance. No hydroureteronephrosis. Urinary bladder is normal in appearance.  Stomach/Bowel: Intraabdominal portion of the stomach is normal in appearance. No pathologic dilatation of small bowel or colon. Normal appendix.  Vascular/Lymphatic: Aortic atherosclerosis with multifocal ectasia of the infrarenal abdominal aorta which measures up to 2.5 x 2.6 cm. Findings and measurements pertinent to potential TAVR procedure, as detailed below. Small penetrating ulcer in the mid right  common iliac artery (axial image 152 of series 15). Celiac axis,  superior mesenteric artery and inferior mesenteric artery are all widely patent without hemodynamically significant stenosis. Single right renal artery is widely patent. On the left, there are 2 small renal arteries, both of which are widely patent without hemodynamically significant stenosis. 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 No lymphadenopathy noted in the abdomen or pelvis.  Reproductive: Prostate gland and seminal vesicles are unremarkable in appearance. Small focal fluid collection in the right inguinal region measuring 2.0 x 3.2 cm is of uncertain etiology and significance, but has a benign appearance, potentially some chronically loculated ascites.  Other: No significant volume of free flowing ascites. No pneumoperitoneum.  Musculoskeletal: There are no aggressive appearing lytic or blastic lesions noted in the visualized portions of the skeleton.  VASCULAR MEASUREMENTS PERTINENT TO TAVR:  AORTA:  Minimal Aortic Diameter-16 x 16 mm  Severity of Aortic Calcification-moderate to severe  RIGHT PELVIS:  Right Common Iliac Artery -  Minimal Diameter-8.9 x 9.5 mm  Tortuosity-mild  Calcification-moderate to severe  Note-small penetrating ulcer in the mid right common iliac artery (axial image 152 of series 15).  Right External Iliac Artery -  Minimal Diameter-8.5 x 8.6 mm  Tortuosity-severe  Calcification-minimal  Right Common Femoral Artery -  Minimal Diameter-8.5 x 9.7 mm  Tortuosity-mild  Calcification-mild  LEFT PELVIS:  Left Common Iliac Artery -  Minimal Diameter-8.2 x 7.8 mm  Tortuosity-mild  Calcification-moderate to severe  Left External Iliac Artery -  Minimal Diameter-9.9 x 9.0 mm  Tortuosity-moderate  Calcification-minimal  Left Common  Femoral Artery -  Minimal Diameter-9.0 x 8.8 mm  Tortuosity-mild  Calcification-mild  Review of the MIP images confirms the above findings.  IMPRESSION: 1. Vascular findings and measurements pertinent to potential TAVR procedure, as detailed above. 2. Severe thickening calcification of the aortic valve, compatible with the reported clinical history of severe aortic stenosis. 3. Large hiatal hernia. This may have implications for real-time monitoring of the procedure by TEE at time of upcoming TAVR. 4. Aortic atherosclerosis, in addition to left main and 3 vessel coronary artery disease. Status post median sternotomy for CABG including LIMA to the LAD. 5. Small penetrating ulcer in the mid right common iliac artery. 6. Additional incidental findings, as above.  Aortic Atherosclerosis (ICD10-I70.0).   Electronically Signed   By: Vinnie Langton M.D.   On: 02/12/2018 09:33   Impression:  This 84 year old gentleman has stage D, severe, symptomatic aortic stenosis with New York Heart Association class II symptoms of exertional fatigue and shortness of breath consistent with chronic diastolic congestive heart failure.I have personally reviewed the patient's transthoracic and transesophageal echocardiograms, cardiac catheterization, and CTA studies.  Echocardiogram shows a trileaflet aortic valve with severe thickening and calcification and restricted mobility.  The peak velocity across aortic valve was 3.6 m/s with a mean transvalvular gradient of 29 mmHg and a dimensionless index of 0.24.  The aortic valve area by planimetry was 0.85 cm.  Left ventricular ejection fraction is normal.  Cardiac catheterization shows continued patency of his bypass grafts and he has no anginal symptoms.  I agree that aortic valve replacement is the best treatment for this patient.  I think he would be at least moderate risk for redo sternotomy and open surgical aortic valve replacement.  I think  transcatheter aortic valve replacement  is the best treatment at his age with prior coronary bypass graft surgery and patent bypass grafts.  His gated cardiac CTA shows anatomy suitable for transcatheter aortic valve replacement without any significant complicating features.  His abdominal and pelvic CTA shows adequate pelvic vasculature to allow transfemoral insertion.  The patient and his wife were counseled at length regarding treatment alternatives for management of severe symptomatic aortic stenosis. The risks and benefits of surgical intervention have been discussed in detail. Long-term prognosis with medical therapy was discussed. Alternative approaches such as conventional surgical aortic valve replacement, transcatheter aortic valve replacement, and palliative medical therapy were compared and contrasted at length. This discussion was placed in the context of the patient's own specific clinical presentation and past medical history. All of their questions been addressed. Following the decision to proceed with transcatheter aortic valve replacement, a discussion was held regarding what types of management strategies would be attempted intraoperatively in the event of life-threatening complications, including whether or not the patient would be considered a candidate for the use of cardiopulmonary bypass and/or conversion to open sternotomy for attempted surgical intervention.   The patient has been advised of a variety of complications that might develop including but not limited to risks of death, stroke, paravalvular leak, aortic dissection or other major vascular complications, aortic annulus rupture, device embolization, cardiac rupture or perforation, mitral regurgitation, acute myocardial infarction, arrhythmia, heart block or bradycardia requiring permanent pacemaker placement, congestive heart failure, respiratory failure, renal failure, pneumonia, infection, other late complications related to  structural valve deterioration or migration, or other complications that might ultimately cause a temporary or permanent loss of functional independence or other long term morbidity. The patient provides full informed consent for the procedure as described and all questions were answered.     Plan:  Transfemoral transcatheter aortic valve replacement on Tuesday, 04/01/2018.   I spent 45 minutes performing this consultation and > 50% of this time was spent face to face counseling and coordinating the care of this patient's severe symptomatic aortic stenosis.    Gaye Pollack, MD 03/20/2018

## 2018-03-26 ENCOUNTER — Encounter (HOSPITAL_BASED_OUTPATIENT_CLINIC_OR_DEPARTMENT_OTHER): Payer: Self-pay

## 2018-03-26 ENCOUNTER — Other Ambulatory Visit: Payer: Self-pay

## 2018-03-26 ENCOUNTER — Emergency Department (HOSPITAL_BASED_OUTPATIENT_CLINIC_OR_DEPARTMENT_OTHER)
Admission: EM | Admit: 2018-03-26 | Discharge: 2018-03-26 | Disposition: A | Discharge status: Home / Self Care | Payer: Medicare Other | Attending: Emergency Medicine | Admitting: Emergency Medicine

## 2018-03-26 ENCOUNTER — Ambulatory Visit: Payer: Medicare Other | Admitting: Cardiology

## 2018-03-26 ENCOUNTER — Other Ambulatory Visit: Payer: Self-pay | Admitting: Family Medicine

## 2018-03-26 ENCOUNTER — Emergency Department (HOSPITAL_BASED_OUTPATIENT_CLINIC_OR_DEPARTMENT_OTHER): Payer: Medicare Other

## 2018-03-26 DIAGNOSIS — R002 Palpitations: Secondary | ICD-10-CM | POA: Diagnosis present

## 2018-03-26 DIAGNOSIS — E119 Type 2 diabetes mellitus without complications: Secondary | ICD-10-CM | POA: Insufficient documentation

## 2018-03-26 DIAGNOSIS — I491 Atrial premature depolarization: Secondary | ICD-10-CM

## 2018-03-26 DIAGNOSIS — Z7984 Long term (current) use of oral hypoglycemic drugs: Secondary | ICD-10-CM | POA: Insufficient documentation

## 2018-03-26 DIAGNOSIS — I119 Hypertensive heart disease without heart failure: Secondary | ICD-10-CM | POA: Insufficient documentation

## 2018-03-26 DIAGNOSIS — Z87891 Personal history of nicotine dependence: Secondary | ICD-10-CM | POA: Insufficient documentation

## 2018-03-26 DIAGNOSIS — I493 Ventricular premature depolarization: Secondary | ICD-10-CM | POA: Diagnosis not present

## 2018-03-26 DIAGNOSIS — Z951 Presence of aortocoronary bypass graft: Secondary | ICD-10-CM | POA: Diagnosis not present

## 2018-03-26 DIAGNOSIS — J9811 Atelectasis: Secondary | ICD-10-CM | POA: Diagnosis not present

## 2018-03-26 DIAGNOSIS — I2510 Atherosclerotic heart disease of native coronary artery without angina pectoris: Secondary | ICD-10-CM | POA: Diagnosis not present

## 2018-03-26 DIAGNOSIS — Z85828 Personal history of other malignant neoplasm of skin: Secondary | ICD-10-CM | POA: Insufficient documentation

## 2018-03-26 DIAGNOSIS — R42 Dizziness and giddiness: Secondary | ICD-10-CM | POA: Insufficient documentation

## 2018-03-26 DIAGNOSIS — Z79899 Other long term (current) drug therapy: Secondary | ICD-10-CM | POA: Insufficient documentation

## 2018-03-26 DIAGNOSIS — Z7982 Long term (current) use of aspirin: Secondary | ICD-10-CM | POA: Insufficient documentation

## 2018-03-26 LAB — BASIC METABOLIC PANEL
Anion gap: 10 (ref 5–15)
BUN: 26 mg/dL — ABNORMAL HIGH (ref 6–20)
CALCIUM: 9.2 mg/dL (ref 8.9–10.3)
CO2: 23 mmol/L (ref 22–32)
CREATININE: 1.36 mg/dL — AB (ref 0.61–1.24)
Chloride: 103 mmol/L (ref 101–111)
GFR, EST AFRICAN AMERICAN: 53 mL/min — AB (ref >60)
GFR, EST NON AFRICAN AMERICAN: 46 mL/min — AB (ref >60)
Glucose, Bld: 138 mg/dL — ABNORMAL HIGH (ref 65–99)
Potassium: 3.9 mmol/L (ref 3.5–5.1)
Sodium: 136 mmol/L (ref 135–145)

## 2018-03-26 LAB — CBC WITH DIFFERENTIAL/PLATELET
BASOS PCT: 0 %
Basophils Absolute: 0 10*3/uL (ref 0.0–0.1)
EOS ABS: 0.1 10*3/uL (ref 0.0–0.7)
Eosinophils Relative: 1 %
HCT: 39.6 % (ref 39.0–52.0)
HEMOGLOBIN: 13.9 g/dL (ref 13.0–17.0)
Lymphocytes Relative: 13 %
Lymphs Abs: 1 10*3/uL (ref 0.7–4.0)
MCH: 33.5 pg (ref 26.0–34.0)
MCHC: 35.1 g/dL (ref 30.0–36.0)
MCV: 95.4 fL (ref 78.0–100.0)
MONOS PCT: 12 %
Monocytes Absolute: 1 10*3/uL (ref 0.1–1.0)
NEUTROS PCT: 74 %
Neutro Abs: 5.8 10*3/uL (ref 1.7–7.7)
Platelets: 171 10*3/uL (ref 150–400)
RBC: 4.15 MIL/uL — ABNORMAL LOW (ref 4.22–5.81)
RDW: 12.1 % (ref 11.5–15.5)
WBC: 7.9 10*3/uL (ref 4.0–10.5)

## 2018-03-26 LAB — TROPONIN I

## 2018-03-26 LAB — BRAIN NATRIURETIC PEPTIDE: B NATRIURETIC PEPTIDE 5: 101.4 pg/mL — AB (ref 0.0–100.0)

## 2018-03-26 NOTE — ED Provider Notes (Signed)
[facility] DEPARTMENT Provider Note   CSN: [id number] Arrival date & time: 03/26/18  2111     History   Chief Complaint Chief Complaint  Patient presents with  . Irregular Heart Beat    HPI Peter Scott is a 84 y.o. male.  HPI  84 year old male with a history of aortic stenosis currently waiting to have a replacement presents with palpitations and lightheadedness.  Started last night but noticed more today.  The patient has had intermittent skipped beats.  He will feel a few irregular beats and then feel one skipped.  When the skipped beat happens he starts to feel briefly lightheaded.  It seemed like it was becoming more frequent where he would only have 2 regular beats and then a skipped beat.  However the skipped beats and lightheadedness have significantly improved and he does not currently feel unless he is feeling his pulse.  He has chronic shortness of breath but no new shortness of breath.  No chest pain or leg swelling.  Currently does not feel dizzy.  Past Medical History:  Diagnosis Date  . Aortic aneurysm ([place])   . Aortic stenosis   . CAD (coronary artery disease)   . Diabetes mellitus type II, controlled ([place])   . Heart disease    Ablation  . History of chicken pox   . Hyperlipidemia   . Hypertension   . S/P CABG x 4    2001 - [place]  . Umbilical hernia     Patient Active Problem List   Diagnosis Date Noted  . S/P CABG x 4   . Severe aortic stenosis   . Medicare annual wellness visit, subsequent 10/04/2014  . Aortic stenosis 09/29/2014  . Bruit 09/29/2014  . S/P gastric bypass 07/23/2014  . Abdominal aortic aneurysm ([place]) 07/23/2014  . BCC (basal cell carcinoma) 07/23/2014  . SCC (squamous cell carcinoma), arm 07/23/2014  . Essential hypertension, benign 07/08/2014  . Coronary artery disease 07/08/2014  . Hyperlipidemia LDL goal <100 07/08/2014  . Type II diabetes mellitus, well controlled ([place]) 07/08/2014  . Colon cancer  screening 07/08/2014    Past Surgical History:  Procedure Laterality Date  . ABLATION     Rhythm problem; rhythm unknown; [place] GRAFT  2001  . EXPLORATION POST OPERATIVE OPEN HEART  2001, June  . RIGHT/LEFT HEART CATH AND CORONARY/GRAFT ANGIOGRAPHY N/A 01/31/2018   Procedure: RIGHT/LEFT HEART CATH AND CORONARY/GRAFT ANGIOGRAPHY;  Surgeon: Burnell Blanks, MD;  Location: [facility];  Service: Cardiovascular;  Laterality: N/A;  . TEE WITHOUT CARDIOVERSION N/A 01/17/2018   Procedure: TRANSESOPHAGEAL ECHOCARDIOGRAM (TEE);  Surgeon: Lelon Perla, MD;  Location: [facility] ENDOSCOPY;  Service: Cardiovascular;  Laterality: N/A;  . TONSILLECTOMY    . UMBILICAL HERNIA REPAIR  2015        Home Medications    Prior to Admission medications   Medication Sig Start Date End Date Taking? Authorizing Provider  amLODipine (NORVASC) 10 MG tablet TAKE 1/2 TABLET BY MOUTH DAILY 10/17/17  Yes Copland, Gay Filler, MD  aspirin EC 81 MG tablet Take 81 mg by mouth daily.   Yes [provider]  atorvastatin (LIPITOR) 80 MG tablet Take 0.5 tablets (40 mg total) by mouth at bedtime. 10/04/16  Yes Raiford Noble C, PA-C  glucose blood (BAYER CONTOUR TEST) test strip TEST ONCE DAILY FOR DIABETES Dx Code: E11.9 04/05/17  Yes Copland, Gay Filler, MD  glyBURIDE (DIABETA) 5 MG tablet TAKE 1/2 (  ONE-HALF) TABLET BY MOUTH TWICE DAILY WITH MEALS 03/26/18  Yes Copland, Gay Filler, MD  losartan (COZAAR) 100 MG tablet TAKE 1/2 TABLET BY MOUTH 2 TIMES A DAY 07/01/17  Yes Copland, Gay Filler, MD  metFORMIN (GLUCOPHAGE) 500 MG tablet TAKE 2 TABLETS BY MOUTH EVERY MORNING THEN TAKE 1 TABLET BY MOUTH EVERY EVENING 10/17/17  Yes Copland, Gay Filler, MD  spironolactone (ALDACTONE) 25 MG tablet Take 0.5 tablets (12.5 mg total) by mouth daily. 11/14/17  Yes Copland, Gay Filler, MD    Family History Family History  Problem Relation Age of Onset  . Diabetes Mother 28       Deceased  .  Heart disease Father 36       Deceased  . Heart disease Maternal Uncle   . Diabetes Maternal Uncle   . Heart disease Brother   . Diabetes Brother   . Diabetes Sister        #1  . Heart disease Sister        #2  . Hyperlipidemia Son        x2    Social History Social History   Tobacco Use  . Smoking status: Former Smoker    Packs/day: 1.00    Years: 3.00    Pack years: 3.00  . Smokeless tobacco: Never Used  Substance Use Topics  . Alcohol use: No    Alcohol/week: 0.0 oz  . Drug use: No     Allergies   Patient has no known allergies.   Review of Systems Review of Systems  Respiratory: Positive for shortness of breath (chronic, unchanged).   Cardiovascular: Positive for palpitations. Negative for chest pain and leg swelling.  Neurological: Positive for light-headedness.  All other systems reviewed and are negative.    Physical Exam Updated Vital Signs BP (!) 150/71   Pulse 94   Temp 97.7 F (36.5 C) (Oral)   Resp (!) 26   Ht 5\' 9"  (1.753 m)   Wt 78.9 kg (174 lb)   SpO2 95%   BMI 25.70 kg/m   Physical Exam  Constitutional: He is oriented to person, place, and time. He appears well-developed and well-nourished. No distress.  HENT:  Head: Normocephalic and atraumatic.  Right Ear: External ear normal.  Left Ear: External ear normal.  Nose: Nose normal.  Eyes: Right eye exhibits no discharge. Left eye exhibits no discharge.  Neck: Neck supple.  Cardiovascular: Normal rate and regular rhythm.  Murmur heard. Pulmonary/Chest: Effort normal and breath sounds normal.  Abdominal: He exhibits no distension.  Musculoskeletal: He exhibits no edema.  Neurological: He is alert and oriented to person, place, and time.  Skin: Skin is warm and dry. He is not diaphoretic.  Nursing note and vitals reviewed.    ED Treatments / Results  Labs (all labs ordered are listed, but only abnormal results are displayed) Labs Reviewed  BASIC METABOLIC PANEL - Abnormal;  Notable for the following components:      Result Value   Glucose, Bld 138 (*)    BUN 26 (*)    Creatinine, Ser 1.36 (*)    GFR calc non Af Amer 46 (*)    GFR calc Af Amer 53 (*)    All other components within normal limits  CBC WITH DIFFERENTIAL/PLATELET - Abnormal; Notable for the following components:   RBC 4.15 (*)    All other components within normal limits  BRAIN NATRIURETIC PEPTIDE - Abnormal; Notable for the following components:   B Natriuretic Peptide 101.4 (*)  All other components within normal limits  TROPONIN I    EKG EKG Interpretation  Date/Time:  Wednesday Mar 26 2018 21:18:30 EDT Ventricular Rate:  71 PR Interval:  184 QRS Duration: 86 QT Interval:  364 QTC Calculation: 395 R Axis:   38 Text Interpretation:  Sinus rhythm with Premature atrial complexes Cannot rule out Anterior infarct , age undetermined Abnormal ECG Confirmed by Sherwood Gambler [phone]) on 03/26/2018 9:28:07 PM   Radiology Dg Chest 2 View  Result Date: 03/26/2018 CLINICAL DATA:  Dyspnea and palpitations with irregular heart beat per patient. Patient scheduled for aortic valve replacement in 6 days. EXAM: CHEST - 2 VIEW COMPARISON:  02/12/2018 FINDINGS: Low lung volumes with bibasilar atelectasis. No pulmonary consolidation or overt pulmonary edema. No effusion or pneumothorax. Tortuous atherosclerotic aorta without aneurysmal dilatation identified. Moderate-sized hiatal hernia is noted. Median sternotomy sutures and post CABG noted as before. Acute osseous abnormality. Degenerative changes are present in the dorsal spine. IMPRESSION: 1. Low lung volumes with bibasilar atelectasis. 2. No pneumonia or CHF. 3. Aortic atherosclerosis. 4. Moderate-sized hiatal hernia Electronically Signed   By: Ashley Royalty M.D.   On: 03/26/2018 22:38    Procedures Procedures (including critical care time)  Medications Ordered in ED Medications - No data to display   Initial Impression / Assessment and Plan / ED  Course  I have reviewed the triage vital signs and the nursing notes.  Pertinent labs & imaging results that were available during my care of the patient were reviewed by me and considered in my medical decision making (see chart for details).     Patient is asymptomatic while in the ED. EKG shows some PACs, which are also seen on monitor. Also occasionally has some solitary PVCs. No runs of PVCs or PACs. No current lightheadedness or other symptoms.  He is only describing solitary skipped beats while he does occasionally feel lightheaded with them, I highly doubt significant arrhythmia.  He appears stable for discharge home.  His heart rate one in the ED is mostly in the low 60s so I do not think adding a beta-blocker for symptomatic care would be helpful.  While he does have some symptomatic palpitations I think it be reasonable to have him follow-up with his cardiologist with no further treatment at this time.  We did discuss that if lightheadedness persists or he develops consistent palpitations he should return to the ER or see his cardiologist.  Final Clinical Impressions(s) / ED Diagnoses   Final diagnoses:  PVC's (premature ventricular contractions)  PAC (premature atrial contraction)    ED Discharge Orders    None       Sherwood Gambler, MD 03/26/18 2329

## 2018-03-26 NOTE — ED Notes (Signed)
Patient c/o intermittent dizziness throughout the day today accompanied by palpitations and SHOB; denies any chest pain.

## 2018-03-26 NOTE — Discharge Instructions (Addendum)
If you develop worsening palpitations or continuous palpitations or dizziness/lightheadedness or if you develop chest pain or shortness of breath, return to the ER for evaluation.  Otherwise follow-up with your cardiologist.

## 2018-03-26 NOTE — ED Triage Notes (Signed)
Pt c/o irregular heart beat today-states he is scheduled for heart valve replacement next week-NAD-steady gait

## 2018-03-26 NOTE — ED Notes (Signed)
ED Provider at bedside. 

## 2018-03-27 NOTE — Pre-Procedure Instructions (Addendum)
Peter Scott  03/27/2018      [place], [place] [address] Phone: [phone] Fax: [phone]  [facility] [address] [place], [place] - [address] [place] [address] Phone: [phone] Fax: [phone]    Your procedure is scheduled on Tues., Apr 01, 2018  Report to [facility] Admitting Entrance "A" at 5:30AM  Call this number if you have problems the morning of surgery:  [phone]   Remember:  Do not eat food or drink liquids after midnight.  Take these medicines the morning of surgery with A SIP OF WATER: NONE  Follow your doctors instructions regarding your Aspirin.  If no instructions were given by your doctor, then you will need to call the prescribing office office to get instructions.    As of today, stop taking all other Aspirins, Vitamins, Fish oils, and Herbal medications. Also stop all NSAIDS i.e. Advil, Ibuprofen, Motrin, Aleve, Anaprox, Naproxen, BC and Goody Powders.  How to Manage Your Diabetes Before and After Surgery  Why is it important to control my blood sugar before and after surgery? . Improving blood sugar levels before and after surgery helps healing and can limit problems. . A way of improving blood sugar control is eating a healthy diet by: o  Eating less sugar and carbohydrates o  Increasing activity/exercise o  Talking with your doctor about reaching your blood sugar goals . High blood sugars (greater than 180 mg/dL) can raise your risk of infections and slow your recovery, so you will need to focus on controlling your diabetes during the weeks before surgery. . Make sure that the doctor who takes care of your diabetes knows about your planned surgery including the date and location.  How do I manage my blood sugar before surgery? . Check your blood sugar at least 4 times a day,  starting 2 days before surgery, to make sure that the level is not too high or low. o Check your blood sugar the morning of your surgery when you wake up and every 2 hours until you get to the Short Stay unit. . If your blood sugar is less than 70 mg/dL, you will need to treat for low blood sugar: o Do not take insulin. o Treat a low blood sugar (less than 70 mg/dL) with  cup of clear juice (cranberry or apple), 4 glucose tablets, OR glucose gel. Recheck blood sugar in 15 minutes after treatment (to make sure it is greater than 70 mg/dL). If your blood sugar is not greater than 70 mg/dL on recheck, call [phone] o  for further instructions. . Report your blood sugar to the short stay nurse when you get to Short Stay.  . If you are admitted to the hospital after surgery: o Your blood sugar will be checked by the staff and you will probably be given insulin after surgery (instead of oral diabetes medicines) to make sure you have good blood sugar levels. o The goal for blood sugar control after surgery is 80-180 mg/dL.  WHAT DO I DO ABOUT MY DIABETES MEDICATION?  Stop Taking MetFORMIN (GLUCOPHAGE) on (5/11)  Do not take GlyBURIDE (DIABETA) and the morning of surgery.  . If your CBG is greater than 220 mg/dL, call us at [phone]   Do not wear jewelry.  Do not wear lotions, powders, colognes, or deodorant.  Do not shave 48 hours prior to surgery.  Men may shave face.  Do not bring valuables to the hospital.  [facility] is not responsible for any belongings or valuables.  Contacts, dentures or bridgework may not be worn into surgery.  Leave your suitcase in the car.  After surgery it may be brought to your room.  For patients admitted to the hospital, discharge time will be determined by your treatment team.  Patients discharged the day of surgery will not be allowed to drive home.   Special instructions:   [facility]- Preparing For Surgery  Before surgery, you can play an  important role. Because skin is not sterile, your skin needs to be as free of germs as possible. You can reduce the number of germs on your skin by washing with CHG (chlorahexidine gluconate) Soap before surgery.  CHG is an antiseptic cleaner which kills germs and bonds with the skin to continue killing germs even after washing.  Oral Hygiene is also important to reduce your risk of infection.  Remember - BRUSH YOUR TEETH THE MORNING OF SURGERY  Please do not use if you have an allergy to CHG or antibacterial soaps. If your skin becomes reddened/irritated stop using the CHG.  Do not shave (including legs and underarms) for at least 48 hours prior to first CHG shower. It is OK to shave your face.  Please follow these instructions carefully.   1. Shower the NIGHT BEFORE SURGERY and the MORNING OF SURGERY with CHG.   2. If you chose to wash your hair, wash your hair first as usual with your normal shampoo.  3. After you shampoo, rinse your hair and body thoroughly to remove the shampoo.  4. Use CHG as you would any other liquid soap. You can apply CHG directly to the skin and wash gently with a scrungie or a clean washcloth.   5. Apply the CHG Soap to your body ONLY FROM THE NECK DOWN.  Do not use on open wounds or open sores. Avoid contact with your eyes, ears, mouth and genitals (private parts). Wash Face and genitals (private parts)  with your normal soap.  6. Wash thoroughly, paying special attention to the area where your surgery will be performed.  7. Thoroughly rinse your body with warm water from the neck down.  8. DO NOT shower/wash with your normal soap after using and rinsing off the CHG Soap.  9. Pat yourself dry with a CLEAN TOWEL.  10. Wear CLEAN PAJAMAS to bed the night before surgery, wear comfortable clothes the morning of surgery  11. Place CLEAN SHEETS on your bed the night of your first shower and DO NOT SLEEP WITH PETS.  Day of Surgery: Do not apply any  deodorants/lotions. Please wear clean clothes to the hospital/surgery center.  Remember to brush your teeth.    Please read over the following fact sheets that you were given. Pain Booklet, Coughing and Deep Breathing, MRSA Information and Surgical Site Infection Prevention

## 2018-03-28 ENCOUNTER — Encounter (HOSPITAL_COMMUNITY)
Admission: RE | Admit: 2018-03-28 | Discharge: 2018-03-28 | Disposition: A | Discharge status: Home / Self Care | Payer: Medicare Other | Source: Ambulatory Visit | Attending: Cardiovascular Disease | Admitting: Cardiovascular Disease

## 2018-03-28 ENCOUNTER — Other Ambulatory Visit: Payer: Self-pay

## 2018-03-28 ENCOUNTER — Other Ambulatory Visit: Payer: Self-pay | Admitting: *Deleted

## 2018-03-28 ENCOUNTER — Encounter: Payer: Self-pay | Admitting: Family Medicine

## 2018-03-28 ENCOUNTER — Encounter (HOSPITAL_COMMUNITY): Payer: Self-pay

## 2018-03-28 DIAGNOSIS — Z7984 Long term (current) use of oral hypoglycemic drugs: Secondary | ICD-10-CM | POA: Diagnosis not present

## 2018-03-28 DIAGNOSIS — Z7901 Long term (current) use of anticoagulants: Secondary | ICD-10-CM | POA: Insufficient documentation

## 2018-03-28 DIAGNOSIS — I2510 Atherosclerotic heart disease of native coronary artery without angina pectoris: Secondary | ICD-10-CM | POA: Diagnosis not present

## 2018-03-28 DIAGNOSIS — E119 Type 2 diabetes mellitus without complications: Secondary | ICD-10-CM | POA: Diagnosis not present

## 2018-03-28 DIAGNOSIS — Z87891 Personal history of nicotine dependence: Secondary | ICD-10-CM | POA: Diagnosis not present

## 2018-03-28 DIAGNOSIS — Z951 Presence of aortocoronary bypass graft: Secondary | ICD-10-CM | POA: Diagnosis not present

## 2018-03-28 DIAGNOSIS — Z7982 Long term (current) use of aspirin: Secondary | ICD-10-CM | POA: Diagnosis not present

## 2018-03-28 DIAGNOSIS — I119 Hypertensive heart disease without heart failure: Secondary | ICD-10-CM | POA: Diagnosis not present

## 2018-03-28 DIAGNOSIS — I350 Nonrheumatic aortic (valve) stenosis: Secondary | ICD-10-CM | POA: Diagnosis not present

## 2018-03-28 DIAGNOSIS — E785 Hyperlipidemia, unspecified: Secondary | ICD-10-CM | POA: Insufficient documentation

## 2018-03-28 DIAGNOSIS — Z79899 Other long term (current) drug therapy: Secondary | ICD-10-CM | POA: Insufficient documentation

## 2018-03-28 LAB — COMPREHENSIVE METABOLIC PANEL
ALBUMIN: 3.5 g/dL (ref 3.5–5.0)
ALK PHOS: 54 U/L (ref 38–126)
ALT: 21 U/L (ref 17–63)
AST: 22 U/L (ref 15–41)
Anion gap: 10 (ref 5–15)
BILIRUBIN TOTAL: 0.6 mg/dL (ref 0.3–1.2)
BUN: 23 mg/dL — AB (ref 6–20)
CO2: 22 mmol/L (ref 22–32)
Calcium: 9.5 mg/dL (ref 8.9–10.3)
Chloride: 105 mmol/L (ref 101–111)
Creatinine, Ser: 1.22 mg/dL (ref 0.61–1.24)
GFR calc Af Amer: >60 mL/min (ref >60)
GFR calc non Af Amer: 53 mL/min — ABNORMAL LOW (ref >60)
GLUCOSE: 126 mg/dL — AB (ref 65–99)
Potassium: 4.6 mmol/L (ref 3.5–5.1)
SODIUM: 137 mmol/L (ref 135–145)
TOTAL PROTEIN: 6.7 g/dL (ref 6.5–8.1)

## 2018-03-28 LAB — TYPE AND SCREEN
ABO/RH(D): O POS
ANTIBODY SCREEN: NEGATIVE

## 2018-03-28 LAB — BLOOD GAS, ARTERIAL
Acid-base deficit: 0.1 mmol/L (ref 0.0–2.0)
Bicarbonate: 23.8 mmol/L (ref 20.0–28.0)
Drawn by: 470591
FIO2: 21
O2 Saturation: 98 %
PCO2 ART: 37.2 mmHg (ref 32.0–48.0)
PH ART: 7.422 (ref 7.350–7.450)
PO2 ART: 111 mmHg — AB (ref 83.0–108.0)
Patient temperature: 98.6

## 2018-03-28 LAB — PROTIME-INR
INR: 0.98
PROTHROMBIN TIME: 12.9 s (ref 11.4–15.2)

## 2018-03-28 LAB — URINALYSIS, ROUTINE W REFLEX MICROSCOPIC
BILIRUBIN URINE: NEGATIVE
Glucose, UA: 50 mg/dL — AB
Hgb urine dipstick: NEGATIVE
KETONES UR: NEGATIVE mg/dL
LEUKOCYTES UA: NEGATIVE
Nitrite: NEGATIVE
PH: 5 (ref 5.0–8.0)
PROTEIN: NEGATIVE mg/dL
Specific Gravity, Urine: 1.019 (ref 1.005–1.030)

## 2018-03-28 LAB — HEMOGLOBIN A1C
Hgb A1c MFr Bld: 7.6 % — ABNORMAL HIGH (ref 4.8–5.6)
Mean Plasma Glucose: 171.42 mg/dL

## 2018-03-28 LAB — SURGICAL PCR SCREEN
MRSA, PCR: NEGATIVE
Staphylococcus aureus: POSITIVE — AB

## 2018-03-28 LAB — GLUCOSE, CAPILLARY: Glucose-Capillary: 180 mg/dL — ABNORMAL HIGH (ref 65–99)

## 2018-03-28 LAB — ABO/RH: ABO/RH(D): O POS

## 2018-03-28 LAB — APTT: aPTT: 29 seconds (ref 24–36)

## 2018-03-28 MED ORDER — GLYBURIDE 5 MG PO TABS: ORAL_TABLET | ORAL | 1 refills | Status: DC

## 2018-03-28 MED FILL — LOSARTAN POTASSIUM 100 MG T: 100 | 90 days supply | Qty: 90 | Fill #3

## 2018-03-28 MED FILL — glyBURIDE 5 MG TABS: 5 | 90 days supply | Qty: 90 | Fill #0

## 2018-03-28 NOTE — Progress Notes (Signed)
PCP - Dr. Janett Billow Copland  Cardiologist - Dr. Kirk Ruths  Chest x-ray - 03/26/18 (E)  EKG - 03/26/18 (E)  Stress Test - 11/23/17 (E)  ECHO - 10/09/17 (E)  Cardiac Cath - 01/31/18 (E)  Sleep Study - Denies CPAP - None  LABS- 03/28/18: CMP, PT, PTT, ABG, T/S, UA  ASA- Hold DOS  HA1C- 03/28/18 Fasting Blood Sugar - 90-110, Today 180 Checks Blood Sugar ___1__ time a day  Anesthesia- Yes- history and previous ER visit 03/26/18  Pt denies having chest pain, sob, or fever at this time. All instructions explained to the pt, with a verbal understanding of the material. Pt agrees to go over the instructions while at home for a better understanding. The opportunity to ask questions was provided.

## 2018-03-28 NOTE — Progress Notes (Signed)
Two attempts to call in prescription to [facility] without success. Mupirocin order placed to be administered day of surgery.

## 2018-03-31 ENCOUNTER — Other Ambulatory Visit: Payer: Self-pay

## 2018-03-31 MED ORDER — SODIUM CHLORIDE 0.9 % IV SOLN
1.5000 g | INTRAVENOUS | Status: AC
  Administered 2018-04-01: 1.5 g via INTRAVENOUS
  Filled 2018-03-31: qty 1.5

## 2018-03-31 MED ORDER — SODIUM CHLORIDE 0.9 % IV SOLN
INTRAVENOUS | Status: DC
  Filled 2018-03-31: qty 1

## 2018-03-31 MED ORDER — POTASSIUM CHLORIDE 2 MEQ/ML IV SOLN
80.0000 meq | INTRAVENOUS | Status: DC
  Filled 2018-03-31: qty 40

## 2018-03-31 MED ORDER — VANCOMYCIN HCL 10 G IV SOLR
1250.0000 mg | INTRAVENOUS | Status: AC
  Administered 2018-04-01: 1250 mg via INTRAVENOUS
  Filled 2018-03-31: qty 1250

## 2018-03-31 MED ORDER — MAGNESIUM SULFATE 50 % IJ SOLN
40.0000 meq | INTRAMUSCULAR | Status: DC
  Filled 2018-03-31: qty 9.85

## 2018-03-31 MED ORDER — GLYBURIDE 5 MG PO TABS: ORAL_TABLET | ORAL | 0 refills | Status: DC

## 2018-03-31 MED ORDER — NOREPINEPHRINE BITARTRATE 1 MG/ML IV SOLN
0.0000 ug/min | INTRAVENOUS | Status: AC
  Administered 2018-04-01: 2 ug/min via INTRAVENOUS
  Filled 2018-03-31: qty 4

## 2018-03-31 MED ORDER — SODIUM CHLORIDE 0.9 % IV SOLN
INTRAVENOUS | Status: DC
  Filled 2018-03-31: qty 30

## 2018-03-31 MED ORDER — NITROGLYCERIN IN D5W 200-5 MCG/ML-% IV SOLN
2.0000 ug/min | INTRAVENOUS | Status: DC
  Filled 2018-03-31: qty 250

## 2018-03-31 MED ORDER — EPINEPHRINE PF 1 MG/ML IJ SOLN
0.0000 ug/min | INTRAMUSCULAR | Status: DC
  Filled 2018-03-31: qty 4

## 2018-03-31 MED ORDER — DOPAMINE-DEXTROSE 3.2-5 MG/ML-% IV SOLN
0.0000 ug/kg/min | INTRAVENOUS | Status: DC
  Filled 2018-03-31: qty 250

## 2018-03-31 MED ORDER — MUPIROCIN 2 % EX OINT: 1.0000 "application " | TOPICAL_OINTMENT | Freq: Once | CUTANEOUS | Status: DC

## 2018-03-31 MED ORDER — SODIUM CHLORIDE 0.9 % IV SOLN: INTRAVENOUS | Status: DC

## 2018-03-31 MED ORDER — DEXMEDETOMIDINE HCL IN NACL 400 MCG/100ML IV SOLN
0.1000 ug/kg/h | INTRAVENOUS | Status: AC
  Administered 2018-04-01: 1 ug/kg/h via INTRAVENOUS
  Filled 2018-03-31: qty 100

## 2018-03-31 MED ORDER — SODIUM CHLORIDE 0.9 % IV SOLN
30.0000 ug/min | INTRAVENOUS | Status: DC
  Filled 2018-03-31: qty 2

## 2018-03-31 NOTE — Anesthesia Preprocedure Evaluation (Addendum)
Anesthesia Evaluation  Patient identified by MRN, date of birth, ID band Patient awake    Reviewed: Allergy & Precautions, NPO status , Patient's Chart, lab work & pertinent test results  Airway Mallampati: III  TM Distance: >3 FB Neck ROM: Full    Dental no notable dental hx.    Pulmonary former smoker,    Pulmonary exam normal breath sounds clear to auscultation       Cardiovascular hypertension, + CAD and + CABG (x 4 in 2011)  + Valvular Problems/Murmurs AS  Rhythm:Regular Rate:Normal + Systolic murmurs ECG: SR, PAC's, rate 71  CATH Prox RCA lesion is 100% stenosed. SVG graft was visualized by angiography and is normal in caliber. Mid LM to Dist LM lesion is 30% stenosed. Prox Cx to Dist Cx lesion is 70% stenosed. SVG graft was not visualized due to inability to cannulate. Prox LAD lesion is 90% stenosed. Ost 1st Diag lesion is 90% stenosed. 1. 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  As above, there were no graft markers. I could not locate the SVG to the OM but it is open based on competitive flow seen on antegrade injections  in the native Circumflex.  2. Aortic stenosis. Cath data is not consistent with echo data. Mean gradient 9 mmHg, peak to peak gradient 9 mmHg, AVA 2.24 cm2. The TEE images demonstrate movement of one of the leaflets. The mean gradient by  TEE was 26 mmHg.   ECHO: Left ventricle: Systolic function was normal. The estimated ejection fraction was in the range of 50% to 55%. Wall motion was normal; there were no regional wall motion abnormalities. Aortic valve: Cusp separation was reduced. There was moderate to severe stenosis. There was mild regurgitation. Valve area (VTI): 0.73 cm^2. Valve area (Vmax): 0.69 cm^2. Valve area (Vmean): 0.66 cm^2. Mitral valve: No evidence of vegetation. There was mild regurgitation. Left atrium: The atrium was mildly dilated. No evidence  of thrombus in the atrial cavity or appendage. Right atrium: The atrium was mildly dilated. Atrial septum: No defect or patent foramen ovale was identified. Tricuspid valve: No evidence of vegetation. Pulmonic valve: No evidence of vegetation.      Neuro/Psych negative neurological ROS  negative psych ROS   GI/Hepatic negative GI ROS, Neg liver ROS,   Endo/Other  diabetes, Oral Hypoglycemic Agents  Renal/GU negative Renal ROS     Musculoskeletal negative musculoskeletal ROS (+)   Abdominal   Peds  Hematology HLD   Anesthesia Other Findings   Reproductive/Obstetrics                            Anesthesia Physical Anesthesia Plan  ASA: IV  Anesthesia Plan: MAC   Post-op Pain Management:    Induction: Intravenous  PONV Risk Score and Plan: 1 and Treatment may vary due to age or medical condition  Airway Management Planned: Simple Face Mask  Additional Equipment: Arterial line and CVP  Intra-op Plan:   Post-operative Plan:   Informed Consent: I have reviewed the patients History and Physical, chart, labs and discussed the procedure including the risks, benefits and alternatives for the proposed anesthesia with the patient or authorized representative who has indicated his/her understanding and acceptance.   Dental advisory given  Plan Discussed with: CRNA  Anesthesia Plan Comments:         Anesthesia Quick Evaluation

## 2018-03-31 NOTE — Progress Notes (Signed)
Anesthesia Chart Review:  Case:  [id number] Date/Time:  04/01/18 0715   Procedures:      TRANSCATHETER AORTIC VALVE REPLACEMENT, TRANSFEMORAL (N/A Chest)     TRANSESOPHAGEAL ECHOCARDIOGRAM (TEE) (N/A )   Anesthesia type:  General   Pre-op diagnosis:  Severe Aortic Stenosis   Location:  [facility] OR ROOM 16 / [facility] OR   Surgeon:  Burnell Blanks, MD     DISCUSSION: Patient is an 84 year old male scheduled for the above procedure. History includes severe AS, CAD (s/p CABG X 4, [place]), dysrhythmia (SVT?, s/p ablation), AAA (2.5x2.6 cm), DM2, HTN, HLD, gastric bypass.  He went to the ED on 03/26/18 for palpitations and lightheadedness. EKG showed PACs. He had some solitary PVCs on telemetry, but not runs. Symptoms resolved and he was felt stable for discharge home. He denied chest pain and SOB at PAT.   If no acute changes then I would anticipate that he can proceed as planned.   VS: BP 125/67   Pulse 64   Temp 36.6 C   Resp 20   Ht 5' 8.5" (1.74 m)   SpO2 97%   BMI 26.07 kg/m   PROVIDERS: Copland, Gay Filler, MD is PCP Kirk Ruths, MD is cardiologist   LABS: Labs reviewed: Acceptable for surgery. His CBC is from 03/26/18 and latest CMET, PT/PTT and A1c are from 03/28/18. H/H 13.9/39.6, PLT 171.  (all labs ordered are listed, but only abnormal results are displayed)  Labs Reviewed  SURGICAL PCR SCREEN - Abnormal; Notable for the following components:      Result Value   Staphylococcus aureus POSITIVE (*)    All other components within normal limits  GLUCOSE, CAPILLARY - Abnormal; Notable for the following components:   Glucose-Capillary 180 (*)    All other components within normal limits  BLOOD GAS, ARTERIAL - Abnormal; Notable for the following components:   pO2, Arterial 111 (*)    All other components within normal limits  COMPREHENSIVE METABOLIC PANEL - Abnormal; Notable for the following components:   Glucose, Bld 126 (*)    BUN 23 (*)    GFR calc non Af Amer 53 (*)     All other components within normal limits  HEMOGLOBIN A1C - Abnormal; Notable for the following components:   Hgb A1c MFr Bld 7.6 (*)    All other components within normal limits  URINALYSIS, ROUTINE W REFLEX MICROSCOPIC - Abnormal; Notable for the following components:   Glucose, UA 50 (*)    All other components within normal limits  APTT  PROTIME-INR  TYPE AND SCREEN  ABO/RH    IMAGES: CXR 03/26/18: IMPRESSION: 1. Low lung volumes with bibasilar atelectasis. 2. No pneumonia or CHF. 3. Aortic atherosclerosis. 4. Moderate-sized hiatal hernia   CTA chest/abd/pelvis 02/11/18: IMPRESSION: 1. Vascular findings and measurements pertinent to potential TAVR procedure, as detailed above. 2. Severe thickening calcification of the aortic valve, compatible with the reported clinical history of severe aortic stenosis. 3. Large hiatal hernia. This may have implications for real-time monitoring of the procedure by TEE at time of upcoming TAVR. 4. Aortic atherosclerosis, in addition to left main and 3 vessel coronary artery disease. Status post median sternotomy for CABG including LIMA to the LAD. 5. Small penetrating ulcer in the mid right common iliac artery. 6. Aortic atherosclerosis with multifocal ectasia of the infrarenal abdominal aorta which measures up to 2.5 x 2.6 cm. 7. Additional incidental findings, as above (see report).  OTHER:  PFTs 02/11/18: FVC 2.41 (  68%), FEV1 1.55 (63%), DLCO unc 12.91 (43%).  EKG: 03/26/18: SR with PACs, cannot rule out anterior infarct, age undetermined.   CV: Cardiac cath 01/31/18:  Prox RCA lesion is 100% stenosed.  SVG graft was visualized by angiography and is normal in caliber.  Mid LM to Dist LM lesion is 30% stenosed.  Prox Cx to Dist Cx lesion is 70% stenosed.  SVG graft was not visualized due to inability to cannulate.  Prox LAD lesion is 90% stenosed.  Ost 1st Diag lesion is 90% stenosed.  1. Severe triple vessel CAD s/p ? 3V CABG  with 3/3 patent bypass grafts. As above, there were no graft markers. I could not locate the SVG to the OM but it is open based on competitive flow seen on antegrade injections in the native Circumflex.  2. Aortic stenosis. Cath data is not consistent with echo data. Mean gradient 9 mmHg, peak to peak gradient 9 mmHg, AVA 2.24 cm2. The TEE images demonstrate movement of one of the leaflets. The mean gradient by TEE was 26 mmHg.  Recommendations: I will review case with Dr. [name]. Clinically he has symptoms c/w severe AS. The DVI by echo in November 2018 was 0.23. Cath data is not c/w severe AS. We will proceed with his scans in workup for TAVR.   Echo 01/17/18: Study Conclusions - Left ventricle: Systolic function was normal. The estimated   ejection fraction was in the range of 50% to 55%. Wall motion was   normal; there were no regional wall motion abnormalities. - Aortic valve: Cusp separation was reduced. There was moderate to   severe stenosis. There was mild regurgitation. Valve area (VTI):   0.73 cm^2. Valve area (Vmax): 0.69 cm^2. Valve area (Vmean): 0.66   cm^2. - Mitral valve: No evidence of vegetation. There was mild   regurgitation. - Left atrium: The atrium was mildly dilated. No evidence of   thrombus in the atrial cavity or appendage. - Right atrium: The atrium was mildly dilated. - Atrial septum: No defect or patent foramen ovale was identified. - Tricuspid valve: No evidence of vegetation. - Pulmonic valve: No evidence of vegetation. Impressions: - Low normal LV systolic function; calcified aortic valve with   fixed left and right coronary cusps; moderate to severe AS (mean   gradient 28 mmHg; AVA 0.85 cm2 by planimetry; dimensionless index   .26); mild AI; mild MR; mild biatrial enlargement; mild TR.  Carotid U/S 02/11/18: Final Interpretation: Right Carotid: Velocities in the right ICA are consistent with a 1-39% stenosis. Left Carotid: Velocities in the left ICA are  consistent with a 1-39% stenosis. Vertebrals: Bilateral vertebral arteries demonstrate antegrade flow. Subclavians: Normal flow hemodynamics were seen in bilateral subclavian arteries.  Past Medical History:  Diagnosis Date  . Aortic aneurysm ([place])   . Aortic stenosis   . CAD (coronary artery disease)   . Diabetes mellitus type II, controlled ([place])   . Heart disease    Ablation  . History of chicken pox   . Hyperlipidemia   . Hypertension   . S/P CABG x 4    2001 - [place]  . Umbilical hernia     Past Surgical History:  Procedure Laterality Date  . ABLATION     Rhythm problem; rhythm unknown; [place] GRAFT  2001  . EXPLORATION POST OPERATIVE OPEN HEART  2001, June  . RIGHT/LEFT HEART CATH AND CORONARY/GRAFT ANGIOGRAPHY N/A 01/31/2018   Procedure: RIGHT/LEFT HEART CATH AND CORONARY/GRAFT  ANGIOGRAPHY;  Surgeon: Burnell Blanks, MD;  Location: [facility];  Service: Cardiovascular;  Laterality: N/A;  . TEE WITHOUT CARDIOVERSION N/A 01/17/2018   Procedure: TRANSESOPHAGEAL ECHOCARDIOGRAM (TEE);  Surgeon: Lelon Perla, MD;  Location: [facility] ENDOSCOPY;  Service: Cardiovascular;  Laterality: N/A;  . TONSILLECTOMY    . UMBILICAL HERNIA REPAIR  2015    MEDICATIONS: . amLODipine (NORVASC) 10 MG tablet  . aspirin EC 81 MG tablet  . atorvastatin (LIPITOR) 80 MG tablet  . glucose blood (BAYER CONTOUR TEST) test strip  . glyBURIDE (DIABETA) 5 MG tablet  . losartan (COZAAR) 100 MG tablet  . metFORMIN (GLUCOPHAGE) 500 MG tablet  . spironolactone (ALDACTONE) 25 MG tablet   No current facility-administered medications for this encounter.    Derrill Memo ON 04/01/2018] 0.9 %  sodium chloride infusion  . [START ON 04/01/2018] mupirocin ointment (BACTROBAN) 2 % 1 application  He is to hold ASA on the day of surgery.   [facility] Short Stay Center/Anesthesiology Phone [phone] 03/31/2018 11:25 AM

## 2018-04-01 ENCOUNTER — Other Ambulatory Visit: Payer: Self-pay

## 2018-04-01 ENCOUNTER — Inpatient Hospital Stay (HOSPITAL_COMMUNITY)
Admission: RE | Admit: 2018-04-01 | Discharge: 2018-04-01 | Disposition: A | Discharge status: Home / Self Care | Payer: Medicare Other | Source: Ambulatory Visit | Attending: Cardiovascular Disease | Admitting: Cardiovascular Disease

## 2018-04-01 ENCOUNTER — Inpatient Hospital Stay (HOSPITAL_COMMUNITY)
Admission: RE | Admit: 2018-04-01 | Discharge: 2018-04-02 | DRG: 267 | Disposition: A | Discharge status: Home / Self Care | Payer: Medicare Other | Source: Ambulatory Visit | Attending: Thoracic Surgery (Cardiothoracic Vascular Surgery) | Admitting: Thoracic Surgery (Cardiothoracic Vascular Surgery)

## 2018-04-01 ENCOUNTER — Inpatient Hospital Stay (HOSPITAL_COMMUNITY): Payer: Medicare Other | Admitting: Vascular Surgery

## 2018-04-01 ENCOUNTER — Encounter (HOSPITAL_COMMUNITY)
Admission: RE | Disposition: A | Discharge status: Home / Self Care | Payer: Self-pay | Source: Ambulatory Visit | Attending: Cardiovascular Disease

## 2018-04-01 ENCOUNTER — Inpatient Hospital Stay (HOSPITAL_COMMUNITY): Payer: Medicare Other

## 2018-04-01 ENCOUNTER — Inpatient Hospital Stay (HOSPITAL_COMMUNITY): Payer: Medicare Other | Admitting: Anesthesiology

## 2018-04-01 ENCOUNTER — Encounter (HOSPITAL_COMMUNITY): Payer: Self-pay | Admitting: *Deleted

## 2018-04-01 DIAGNOSIS — Z952 Presence of prosthetic heart valve: Secondary | ICD-10-CM

## 2018-04-01 DIAGNOSIS — Z7982 Long term (current) use of aspirin: Secondary | ICD-10-CM | POA: Diagnosis not present

## 2018-04-01 DIAGNOSIS — E119 Type 2 diabetes mellitus without complications: Secondary | ICD-10-CM | POA: Diagnosis not present

## 2018-04-01 DIAGNOSIS — Z7984 Long term (current) use of oral hypoglycemic drugs: Secondary | ICD-10-CM | POA: Diagnosis not present

## 2018-04-01 DIAGNOSIS — I110 Hypertensive heart disease with heart failure: Secondary | ICD-10-CM | POA: Diagnosis present

## 2018-04-01 DIAGNOSIS — Z87891 Personal history of nicotine dependence: Secondary | ICD-10-CM

## 2018-04-01 DIAGNOSIS — Z006 Encounter for examination for normal comparison and control in clinical research program: Secondary | ICD-10-CM | POA: Diagnosis not present

## 2018-04-01 DIAGNOSIS — E785 Hyperlipidemia, unspecified: Secondary | ICD-10-CM | POA: Diagnosis not present

## 2018-04-01 DIAGNOSIS — I10 Essential (primary) hypertension: Secondary | ICD-10-CM | POA: Diagnosis present

## 2018-04-01 DIAGNOSIS — Z951 Presence of aortocoronary bypass graft: Secondary | ICD-10-CM

## 2018-04-01 DIAGNOSIS — I5032 Chronic diastolic (congestive) heart failure: Secondary | ICD-10-CM | POA: Diagnosis not present

## 2018-04-01 DIAGNOSIS — I2510 Atherosclerotic heart disease of native coronary artery without angina pectoris: Secondary | ICD-10-CM | POA: Diagnosis not present

## 2018-04-01 DIAGNOSIS — Z8249 Family history of ischemic heart disease and other diseases of the circulatory system: Secondary | ICD-10-CM | POA: Diagnosis not present

## 2018-04-01 DIAGNOSIS — J90 Pleural effusion, not elsewhere classified: Secondary | ICD-10-CM | POA: Diagnosis not present

## 2018-04-01 DIAGNOSIS — D649 Anemia, unspecified: Secondary | ICD-10-CM | POA: Diagnosis not present

## 2018-04-01 DIAGNOSIS — I361 Nonrheumatic tricuspid (valve) insufficiency: Secondary | ICD-10-CM | POA: Diagnosis not present

## 2018-04-01 DIAGNOSIS — I350 Nonrheumatic aortic (valve) stenosis: Principal | ICD-10-CM

## 2018-04-01 DIAGNOSIS — Z954 Presence of other heart-valve replacement: Secondary | ICD-10-CM | POA: Diagnosis not present

## 2018-04-01 DIAGNOSIS — Z953 Presence of xenogenic heart valve: Secondary | ICD-10-CM

## 2018-04-01 DIAGNOSIS — Z833 Family history of diabetes mellitus: Secondary | ICD-10-CM | POA: Diagnosis not present

## 2018-04-01 DIAGNOSIS — E1169 Type 2 diabetes mellitus with other specified complication: Secondary | ICD-10-CM

## 2018-04-01 HISTORY — DX: Presence of prosthetic heart valve: Z95.2

## 2018-04-01 HISTORY — PX: INTRAOPERATIVE TRANSTHORACIC ECHOCARDIOGRAM: SHX6523

## 2018-04-01 HISTORY — PX: TRANSCATHETER AORTIC VALVE REPLACEMENT, TRANSFEMORAL: SHX6400

## 2018-04-01 LAB — POCT I-STAT, CHEM 8
BUN: 17 mg/dL (ref 6–20)
BUN: 17 mg/dL (ref 6–20)
CHLORIDE: 101 mmol/L (ref 101–111)
CREATININE: 1 mg/dL (ref 0.61–1.24)
CREATININE: 1 mg/dL (ref 0.61–1.24)
Calcium, Ion: 1.27 mmol/L (ref 1.15–1.40)
Calcium, Ion: 1.24 mmol/L (ref 1.15–1.40)
Chloride: 101 mmol/L (ref 101–111)
GLUCOSE: 189 mg/dL — AB (ref 65–99)
Glucose, Bld: 172 mg/dL — ABNORMAL HIGH (ref 65–99)
HCT: 34 % — ABNORMAL LOW (ref 39.0–52.0)
HEMATOCRIT: 35 % — AB (ref 39.0–52.0)
Hemoglobin: 11.9 g/dL — ABNORMAL LOW (ref 13.0–17.0)
Hemoglobin: 11.6 g/dL — ABNORMAL LOW (ref 13.0–17.0)
POTASSIUM: 4.3 mmol/L (ref 3.5–5.1)
POTASSIUM: 4.7 mmol/L (ref 3.5–5.1)
Sodium: 139 mmol/L (ref 135–145)
Sodium: 137 mmol/L (ref 135–145)
TCO2: 27 mmol/L (ref 22–32)
TCO2: 24 mmol/L (ref 22–32)

## 2018-04-01 LAB — CBC
HEMATOCRIT: 36.8 % — AB (ref 39.0–52.0)
Hemoglobin: 12.4 g/dL — ABNORMAL LOW (ref 13.0–17.0)
MCH: 32.3 pg (ref 26.0–34.0)
MCHC: 33.7 g/dL (ref 30.0–36.0)
MCV: 95.8 fL (ref 78.0–100.0)
PLATELETS: 143 10*3/uL — AB (ref 150–400)
RBC: 3.84 MIL/uL — ABNORMAL LOW (ref 4.22–5.81)
RDW: 12.1 % (ref 11.5–15.5)
WBC: 6.8 10*3/uL (ref 4.0–10.5)

## 2018-04-01 LAB — GLUCOSE, CAPILLARY: GLUCOSE-CAPILLARY: 155 mg/dL — AB (ref 65–99)

## 2018-04-01 LAB — POCT I-STAT 4, (NA,K, GLUC, HGB,HCT)
GLUCOSE: 188 mg/dL — AB (ref 65–99)
HEMATOCRIT: 34 % — AB (ref 39.0–52.0)
HEMOGLOBIN: 11.6 g/dL — AB (ref 13.0–17.0)
Potassium: 4.6 mmol/L (ref 3.5–5.1)
Sodium: 137 mmol/L (ref 135–145)

## 2018-04-01 LAB — PROTIME-INR
INR: 1.18
Prothrombin Time: 14.9 seconds (ref 11.4–15.2)

## 2018-04-01 LAB — APTT: APTT: 36 s (ref 24–36)

## 2018-04-01 SURGERY — IMPLANTATION, AORTIC VALVE, TRANSCATHETER, FEMORAL APPROACH
Anesthesia: Monitor Anesthesia Care | Site: Chest

## 2018-04-01 MED ORDER — PROPOFOL 10 MG/ML IV BOLUS
INTRAVENOUS | Status: AC
  Filled 2018-04-01: qty 20

## 2018-04-01 MED ORDER — GLYCOPYRROLATE 0.2 MG/ML IJ SOLN
INTRAMUSCULAR | Status: DC | PRN
  Administered 2018-04-01 (×2): .2 mg via INTRAVENOUS

## 2018-04-01 MED ORDER — CHLORHEXIDINE GLUCONATE 4 % EX LIQD: 30.0000 mL | CUTANEOUS | Status: DC

## 2018-04-01 MED ORDER — IODIXANOL 320 MG/ML IV SOLN
INTRAVENOUS | Status: DC | PRN
  Administered 2018-04-01: 80.7 mL via INTRAVENOUS

## 2018-04-01 MED ORDER — ACETAMINOPHEN 160 MG/5ML PO SOLN: 1000.0000 mg | Freq: Four times a day (QID) | ORAL | Status: DC

## 2018-04-01 MED ORDER — FENTANYL CITRATE (PF) 100 MCG/2ML IJ SOLN
INTRAMUSCULAR | Status: DC | PRN
  Administered 2018-04-01 (×3): 25 ug via INTRAVENOUS

## 2018-04-01 MED ORDER — SODIUM CHLORIDE 0.9 % IV SOLN
INTRAVENOUS | Status: AC
  Filled 2018-04-01 (×3): qty 1.2

## 2018-04-01 MED ORDER — AMLODIPINE BESYLATE 5 MG PO TABS: 5.0000 mg | ORAL_TABLET | Freq: Every day | ORAL | Status: DC

## 2018-04-01 MED ORDER — LACTATED RINGERS IV SOLN: 500.0000 mL | Freq: Once | INTRAVENOUS | Status: DC | PRN

## 2018-04-01 MED ORDER — ACETAMINOPHEN 500 MG PO TABS
1000.0000 mg | ORAL_TABLET | Freq: Four times a day (QID) | ORAL | Status: DC
  Filled 2018-04-01: qty 2

## 2018-04-01 MED ORDER — LACTATED RINGERS IV SOLN
INTRAVENOUS | Status: DC | PRN
  Administered 2018-04-01: 07:00:00 via INTRAVENOUS

## 2018-04-01 MED ORDER — TRAMADOL HCL 50 MG PO TABS: 50.0000 mg | ORAL_TABLET | ORAL | Status: DC | PRN

## 2018-04-01 MED ORDER — MIDAZOLAM HCL 5 MG/5ML IJ SOLN
INTRAMUSCULAR | Status: DC | PRN
  Administered 2018-04-01: 1 mg via INTRAVENOUS

## 2018-04-01 MED ORDER — CHLORHEXIDINE GLUCONATE 4 % EX LIQD: 60.0000 mL | Freq: Once | CUTANEOUS | Status: DC

## 2018-04-01 MED ORDER — MIDAZOLAM HCL 2 MG/2ML IJ SOLN
INTRAMUSCULAR | Status: AC
  Filled 2018-04-01: qty 2

## 2018-04-01 MED ORDER — OXYCODONE HCL 5 MG PO TABS: 5.0000 mg | ORAL_TABLET | ORAL | Status: DC | PRN

## 2018-04-01 MED ORDER — MORPHINE SULFATE (PF) 2 MG/ML IV SOLN: 2.0000 mg | INTRAVENOUS | Status: DC | PRN

## 2018-04-01 MED ORDER — LIDOCAINE HCL (PF) 1 % IJ SOLN
INTRAMUSCULAR | Status: AC
  Filled 2018-04-01: qty 30

## 2018-04-01 MED ORDER — SODIUM CHLORIDE 0.9 % IV SOLN
INTRAVENOUS | Status: AC
  Administered 2018-04-01: 10:00:00 via INTRAVENOUS

## 2018-04-01 MED ORDER — SODIUM CHLORIDE 0.9 % IV SOLN
INTRAVENOUS | Status: DC | PRN
  Administered 2018-04-01: 1500 mL

## 2018-04-01 MED ORDER — ASPIRIN EC 81 MG PO TBEC
81.0000 mg | DELAYED_RELEASE_TABLET | Freq: Every day | ORAL | Status: DC
  Administered 2018-04-02: 81 mg via ORAL
  Filled 2018-04-01: qty 1

## 2018-04-01 MED ORDER — LOSARTAN POTASSIUM 50 MG PO TABS
50.0000 mg | ORAL_TABLET | Freq: Two times a day (BID) | ORAL | Status: DC
  Administered 2018-04-01 – 2018-04-02 (×2): 50 mg via ORAL
  Filled 2018-04-01 (×2): qty 1

## 2018-04-01 MED ORDER — SPIRONOLACTONE 25 MG PO TABS
12.5000 mg | ORAL_TABLET | Freq: Every day | ORAL | Status: DC
  Administered 2018-04-02: 12.5 mg via ORAL
  Filled 2018-04-01: qty 1

## 2018-04-01 MED ORDER — DOPAMINE-DEXTROSE 3.2-5 MG/ML-% IV SOLN
0.0000 ug/kg/min | INTRAVENOUS | Status: DC
  Administered 2018-04-01: 5 ug/kg/min via INTRAVENOUS
  Filled 2018-04-01: qty 250

## 2018-04-01 MED ORDER — ATORVASTATIN CALCIUM 40 MG PO TABS
40.0000 mg | ORAL_TABLET | Freq: Every day | ORAL | Status: DC
  Administered 2018-04-01: 40 mg via ORAL
  Filled 2018-04-01: qty 1

## 2018-04-01 MED ORDER — LIDOCAINE HCL 1 % IJ SOLN
INTRAMUSCULAR | Status: DC | PRN
  Administered 2018-04-01: 10 mL

## 2018-04-01 MED ORDER — ACETAMINOPHEN 650 MG RE SUPP: 650.0000 mg | Freq: Once | RECTAL | Status: DC

## 2018-04-01 MED ORDER — FAMOTIDINE IN NACL 20-0.9 MG/50ML-% IV SOLN
20.0000 mg | Freq: Two times a day (BID) | INTRAVENOUS | Status: DC
  Administered 2018-04-01: 20 mg via INTRAVENOUS
  Filled 2018-04-01: qty 50

## 2018-04-01 MED ORDER — FENTANYL CITRATE (PF) 250 MCG/5ML IJ SOLN
INTRAMUSCULAR | Status: AC
  Filled 2018-04-01: qty 5

## 2018-04-01 MED ORDER — ONDANSETRON HCL 4 MG/2ML IJ SOLN
INTRAMUSCULAR | Status: DC | PRN
  Administered 2018-04-01: 4 mg via INTRAVENOUS

## 2018-04-01 MED ORDER — SODIUM CHLORIDE 0.9 % IV SOLN
1.5000 g | Freq: Two times a day (BID) | INTRAVENOUS | Status: DC
  Administered 2018-04-01 – 2018-04-02 (×3): 1.5 g via INTRAVENOUS
  Filled 2018-04-01 (×5): qty 1.5

## 2018-04-01 MED ORDER — ACETAMINOPHEN 500 MG PO TABS
1000.0000 mg | ORAL_TABLET | Freq: Four times a day (QID) | ORAL | Status: DC
Start: 2018-04-01 — End: 2018-04-02
  Administered 2018-04-01 – 2018-04-02 (×3): 1000 mg via ORAL
  Filled 2018-04-01 (×2): qty 2

## 2018-04-01 MED ORDER — SODIUM CHLORIDE 0.9 % IV BOLUS
500.0000 mL | Freq: Once | INTRAVENOUS | Status: AC
  Administered 2018-04-01: 500 mL via INTRAVENOUS

## 2018-04-01 MED ORDER — GLYBURIDE 2.5 MG PO TABS
2.5000 mg | ORAL_TABLET | Freq: Two times a day (BID) | ORAL | Status: DC
  Administered 2018-04-01 – 2018-04-02 (×2): 2.5 mg via ORAL
  Filled 2018-04-01 (×3): qty 1

## 2018-04-01 MED ORDER — CHLORHEXIDINE GLUCONATE 0.12 % MT SOLN
15.0000 mL | Freq: Once | OROMUCOSAL | Status: AC
  Administered 2018-04-01: 15 mL via OROMUCOSAL
  Filled 2018-04-01: qty 15

## 2018-04-01 MED ORDER — METOPROLOL TARTRATE 5 MG/5ML IV SOLN: 2.5000 mg | INTRAVENOUS | Status: DC | PRN

## 2018-04-01 MED ORDER — PROTAMINE SULFATE 10 MG/ML IV SOLN
INTRAVENOUS | Status: DC | PRN
  Administered 2018-04-01: 10 mg via INTRAVENOUS
  Administered 2018-04-01: 110 mg via INTRAVENOUS

## 2018-04-01 MED ORDER — MIDAZOLAM HCL 2 MG/2ML IJ SOLN: 2.0000 mg | INTRAMUSCULAR | Status: DC | PRN

## 2018-04-01 MED ORDER — MUPIROCIN 2 % EX OINT
TOPICAL_OINTMENT | CUTANEOUS | Status: AC
  Filled 2018-04-01: qty 22

## 2018-04-01 MED ORDER — VANCOMYCIN HCL IN DEXTROSE 1-5 GM/200ML-% IV SOLN
1000.0000 mg | Freq: Once | INTRAVENOUS | Status: AC
  Administered 2018-04-01: 1000 mg via INTRAVENOUS
  Filled 2018-04-01: qty 200

## 2018-04-01 MED ORDER — MUPIROCIN 2 % EX OINT
1.0000 "application " | TOPICAL_OINTMENT | Freq: Two times a day (BID) | CUTANEOUS | Status: DC
  Administered 2018-04-01: 1 via TOPICAL

## 2018-04-01 MED ORDER — ONDANSETRON HCL 4 MG/2ML IJ SOLN: 4.0000 mg | Freq: Four times a day (QID) | INTRAMUSCULAR | Status: DC | PRN

## 2018-04-01 MED ORDER — ACETAMINOPHEN 160 MG/5ML PO SOLN: 650.0000 mg | Freq: Once | ORAL | Status: DC

## 2018-04-01 MED ORDER — PANTOPRAZOLE SODIUM 40 MG PO TBEC: 40.0000 mg | DELAYED_RELEASE_TABLET | Freq: Every day | ORAL | Status: DC

## 2018-04-01 MED ORDER — HEPARIN SODIUM (PORCINE) 1000 UNIT/ML IJ SOLN
INTRAMUSCULAR | Status: DC | PRN
  Administered 2018-04-01: 12000 [IU] via INTRAVENOUS

## 2018-04-01 MED ORDER — ALBUMIN HUMAN 5 % IV SOLN
250.0000 mL | INTRAVENOUS | Status: DC | PRN
  Administered 2018-04-01: 250 mL via INTRAVENOUS
  Filled 2018-04-01: qty 250

## 2018-04-01 MED ORDER — DEXMEDETOMIDINE HCL IN NACL 200 MCG/50ML IV SOLN
INTRAVENOUS | Status: DC | PRN
  Administered 2018-04-01: 39.44 ug via INTRAVENOUS

## 2018-04-01 MED ORDER — CLOPIDOGREL BISULFATE 75 MG PO TABS
75.0000 mg | ORAL_TABLET | Freq: Every day | ORAL | Status: DC
  Administered 2018-04-02: 75 mg via ORAL
  Filled 2018-04-01: qty 1

## 2018-04-01 MED ORDER — ORAL CARE MOUTH RINSE
15.0000 mL | Freq: Two times a day (BID) | OROMUCOSAL | Status: DC
  Administered 2018-04-01: 15 mL via OROMUCOSAL

## 2018-04-01 MED ORDER — PROPOFOL 10 MG/ML IV BOLUS
INTRAVENOUS | Status: DC | PRN
  Administered 2018-04-01: 20 mg via INTRAVENOUS

## 2018-04-01 SURGICAL SUPPLY — 60 items
BAG DECANTER FOR FLEXI CONT (MISCELLANEOUS) ×3 IMPLANT
BAG SNAP BAND KOVER 36X36 (MISCELLANEOUS) ×3 IMPLANT
BLADE CLIPPER SURG (BLADE) ×3 IMPLANT
CABLE ADAPT CONN TEMP 6FT (ADAPTER) ×3 IMPLANT
CATH DIAG EXPO 6F VENT PIG 145 (CATHETERS) ×6 IMPLANT
CATH EXPO 5FR AL1 (CATHETERS) IMPLANT
CATH INFINITI 6F AL2 (CATHETERS) ×3 IMPLANT
CATH S G BIP PACING (SET/KITS/TRAYS/PACK) ×3 IMPLANT
CONT SPEC 4OZ CLIKSEAL STRL BL (MISCELLANEOUS) ×6 IMPLANT
COVER BACK TABLE 24X17X13 BIG (DRAPES) IMPLANT
COVER BACK TABLE 80X110 HD (DRAPES) ×6 IMPLANT
COVER DOME SNAP 22 D (MISCELLANEOUS) IMPLANT
CRADLE DONUT ADULT HEAD (MISCELLANEOUS) ×3 IMPLANT
DERMABOND ADVANCED (GAUZE/BANDAGES/DRESSINGS) ×1
DERMABOND ADVANCED .7 DNX12 (GAUZE/BANDAGES/DRESSINGS) ×2 IMPLANT
DEVICE CLOSURE PERCLS PRGLD 6F (VASCULAR PRODUCTS) ×4 IMPLANT
DRSG TEGADERM 4X4.75 (GAUZE/BANDAGES/DRESSINGS) ×6 IMPLANT
ELECT CAUTERY BLADE 6.4 (BLADE) IMPLANT
ELECT REM PT RETURN 9FT ADLT (ELECTROSURGICAL) ×6
ELECTRODE REM PT RTRN 9FT ADLT (ELECTROSURGICAL) ×4 IMPLANT
GAUZE SPONGE 4X4 12PLY STRL (GAUZE/BANDAGES/DRESSINGS) ×3 IMPLANT
GLOVE BIO SURGEON STRL SZ7.5 (GLOVE) ×3 IMPLANT
GLOVE BIO SURGEON STRL SZ8 (GLOVE) IMPLANT
GLOVE EUDERMIC 7 POWDERFREE (GLOVE) IMPLANT
GLOVE ORTHO TXT STRL SZ7.5 (GLOVE) IMPLANT
GOWN STRL REUS W/ TWL LRG LVL3 (GOWN DISPOSABLE) IMPLANT
GOWN STRL REUS W/ TWL XL LVL3 (GOWN DISPOSABLE) ×2 IMPLANT
GOWN STRL REUS W/TWL LRG LVL3 (GOWN DISPOSABLE)
GOWN STRL REUS W/TWL XL LVL3 (GOWN DISPOSABLE) ×1
GUIDEWIRE SAFE TJ AMPLATZ EXST (WIRE) ×3 IMPLANT
GUIDEWIRE STRAIGHT .035 260CM (WIRE) ×3 IMPLANT
KIT BASIN OR (CUSTOM PROCEDURE TRAY) ×3 IMPLANT
KIT DILATOR VASC 18G NDL (KITS) IMPLANT
KIT HEART LEFT (KITS) ×3 IMPLANT
KIT SUCTION CATH 14FR (SUCTIONS) IMPLANT
KIT TURNOVER KIT B (KITS) ×3 IMPLANT
NEEDLE 22X1 1/2 (OR ONLY) (NEEDLE) ×3 IMPLANT
NEEDLE PERC 18GX7CM (NEEDLE) ×3 IMPLANT
NS IRRIG 1000ML POUR BTL (IV SOLUTION) ×9 IMPLANT
PACK ENDOVASCULAR (PACKS) ×3 IMPLANT
PAD ARMBOARD 7.5X6 YLW CONV (MISCELLANEOUS) ×6 IMPLANT
PAD ELECT DEFIB RADIOL ZOLL (MISCELLANEOUS) ×3 IMPLANT
PENCIL BUTTON HOLSTER BLD 10FT (ELECTRODE) ×3 IMPLANT
PERCLOSE PROGLIDE 6F (VASCULAR PRODUCTS) ×6
SET MICROPUNCTURE 5F STIFF (MISCELLANEOUS) ×3 IMPLANT
SHEATH BRITE TIP 6FR 35CM (SHEATH) ×3 IMPLANT
SHEATH PINNACLE 6F 10CM (SHEATH) ×3 IMPLANT
SHEATH PINNACLE 8F 10CM (SHEATH) ×3 IMPLANT
SLEEVE REPOSITIONING LENGTH 30 (MISCELLANEOUS) ×3 IMPLANT
SPONGE LAP 4X18 X RAY DECT (DISPOSABLE) IMPLANT
STOPCOCK MORSE 400PSI 3WAY (MISCELLANEOUS) ×6 IMPLANT
SUT SILK  1 MH (SUTURE) ×1
SUT SILK 1 MH (SUTURE) ×2 IMPLANT
SYR 50ML LL SCALE MARK (SYRINGE) ×3 IMPLANT
SYR CONTROL 10ML LL (SYRINGE) ×3 IMPLANT
TAPE CLOTH SURG 4X10 WHT LF (GAUZE/BANDAGES/DRESSINGS) ×3 IMPLANT
TOWEL GREEN STERILE (TOWEL DISPOSABLE) ×6 IMPLANT
TRANSDUCER W/STOPCOCK (MISCELLANEOUS) ×6 IMPLANT
VALVE HEART TRANSCATH SZ3 26MM (Prosthesis & Implant Heart) ×3 IMPLANT
WIRE .035 3MM-J 145CM (WIRE) ×3 IMPLANT

## 2018-04-01 NOTE — Progress Notes (Signed)
Echocardiogram 2D Echocardiogram has been performed.  Peter Scott 04/01/2018, 8:09 AM

## 2018-04-01 NOTE — Progress Notes (Signed)
  Echocardiogram 2D Echocardiogram has been performed.  Peter Scott 04/01/2018, 8:07 AM

## 2018-04-01 NOTE — Transfer of Care (Signed)
Immediate Anesthesia Transfer of Care Note  Patient: Peter Scott  Procedure(s) Performed: TRANSCATHETER AORTIC VALVE REPLACEMENT, TRANSFEMORAL (N/A Chest) TRANSESOPHAGEAL ECHOCARDIOGRAM (TEE) (N/A )  Patient Location: ICU  Anesthesia Type:MAC  Level of Consciousness: awake, alert  and patient cooperative  Airway & Oxygen Therapy: Patient Spontanous Breathing and Patient connected to face mask oxygen  Post-op Assessment: Report given to RN and Post -op Vital signs reviewed and stable  Post vital signs: Reviewed and stable  Last Vitals:  Vitals Value Taken Time  BP 105/57 04/01/2018  9:38 AM  Temp    Pulse 50 04/01/2018  9:40 AM  Resp 25 04/01/2018  9:40 AM  SpO2 99 % 04/01/2018  9:40 AM  Vitals shown include unvalidated device data.  Last Pain:  Vitals:   04/01/18 0559  TempSrc:   PainSc: 0-No pain      Patients Stated Pain Goal: 3 (34/35/68 6168)  Complications: No apparent anesthesia complications

## 2018-04-01 NOTE — Anesthesia Procedure Notes (Signed)
Arterial Line Insertion Start/End5/14/2019 6:45 AM Performed by: White, Amedeo Plenty, Immunologist, CRNA  Lidocaine 1% used for infiltration Right, radial was placed Catheter size: 20 G Hand hygiene performed  and maximum sterile barriers used  Allen's test indicative of satisfactory collateral circulation Attempts: 1 Procedure performed without using ultrasound guided technique. Following insertion, dressing applied and Biopatch. Post procedure assessment: normal  Patient tolerated the procedure well with no immediate complications.

## 2018-04-01 NOTE — Plan of Care (Signed)
  Problem: Education: Goal: Knowledge of General Education information will improve Outcome: Progressing   Problem: Health Behavior/Discharge Planning: Goal: Ability to manage health-related needs will improve Outcome: Progressing   Problem: Activity: Goal: Risk for activity intolerance will decrease Outcome: Progressing   Problem: Nutrition: Goal: Adequate nutrition will be maintained Outcome: Progressing   Problem: Elimination: Goal: Will not experience complications related to bowel motility Outcome: Progressing

## 2018-04-01 NOTE — Interval H&P Note (Signed)
History and Physical Interval Note:  04/01/2018 6:25 AM  Peter Scott  has presented today for surgery, with the diagnosis of Severe Aortic Stenosis  The various methods of treatment have been discussed with the patient and family. After consideration of risks, benefits and other options for treatment, the patient has consented to  Procedure(s): TRANSCATHETER AORTIC VALVE REPLACEMENT, TRANSFEMORAL (N/A) TRANSESOPHAGEAL ECHOCARDIOGRAM (TEE) (N/A) as a surgical intervention .  The patient's history has been reviewed, patient examined, no change in status, stable for surgery.  I have reviewed the patient's chart and labs.  Questions were answered to the patient's satisfaction.     Rexene Alberts

## 2018-04-01 NOTE — Anesthesia Procedure Notes (Signed)
Central Venous Catheter Insertion Performed by: Suzette Battiest, MD, anesthesiologist Start/End5/14/2019 6:35 AM, 04/01/2018 6:45 AM Patient location: Pre-op. Preanesthetic checklist: patient identified, IV checked, site marked, risks and benefits discussed, surgical consent, monitors and equipment checked, pre-op evaluation, timeout performed and anesthesia consent Position: Trendelenburg Lidocaine 1% used for infiltration and patient sedated Hand hygiene performed , maximum sterile barriers used  and Seldinger technique used Catheter size: 8 Fr Total catheter length 16. Central line was placed.Double lumen Procedure performed using ultrasound guided technique. Ultrasound Notes:anatomy identified, needle tip was noted to be adjacent to the nerve/plexus identified, no ultrasound evidence of intravascular and/or intraneural injection and image(s) printed for medical record Attempts: 2 Following insertion, dressing applied, line sutured and Biopatch. Post procedure assessment: blood return through all ports  Patient tolerated the procedure well with no immediate complications.

## 2018-04-01 NOTE — Progress Notes (Signed)
6 french sheath removed from left femoral artery and left femoral vein.  Pressure held x 20 minutes.  Site looks good with no hematoma.  Vitals stable throughout sheath removal.  Dressed with 4x4 and tegaderm.  Bedrest instructions given.  RN will continue to monitor site.

## 2018-04-01 NOTE — CV Procedure (Signed)
[facility]  TAVR OPERATIVE NOTE   Date of Procedure:  04/01/2018  Preoperative Diagnosis: Severe Aortic Stenosis   Postoperative Diagnosis: Same   Procedure:    Transcatheter Aortic Valve Replacement - Transfemoral Approach  Edwards Sapien 3 THV (size 26 mm, model # [id number], serial # [id number])   Co-Surgeons:  Lauree Chandler, MD and Valentina Gu. Roxy Manns, MD   Anesthesiologist:  Roanna Banning  Echocardiographer:  Aundra Dubin  Pre-operative Echo Findings:  Severe aortic stenosis  Normal left ventricular systolic function  Post-operative Echo Findings:  No paravalvular leak  Normal left ventricular systolic function  BRIEF CLINICAL NOTE AND INDICATIONS FOR SURGERY  84 yo male with h/o severe aortic stenosis, CAD s/p 4V CABG in 2001, post-op atrial fibrillation s/p ablation, DM, HLD, HTN with recent worsened fatigue and dyspnea on exertion. Cardiac cath with 3/3 patent bypass grafts.   During the course of the patient's preoperative work up they have been evaluated comprehensively by a multidisciplinary team of specialists coordinated through the [facility] in the [facility].  They have been demonstrated to suffer from symptomatic severe aortic stenosis as noted above. The patient has been counseled extensively as to the relative risks and benefits of all options for the treatment of severe aortic stenosis including long term medical therapy, conventional surgery for aortic valve replacement, and transcatheter aortic valve replacement.  The patient has been independently evaluated by two cardiac surgeons including Dr Roxy Manns and Dr. Cyndia Bent, and they are felt to be at high risk for conventional surgical aortic valve replacement. Both surgeons indicated the patient would be a poor candidate for conventional surgery. Based upon review of all of the patient's preoperative diagnostic tests they are felt to be candidate for  transcatheter aortic valve replacement using the transfemoral approach as an alternative to high risk conventional surgery.    Following the decision to proceed with transcatheter aortic valve replacement, a discussion has been held regarding what types of management strategies would be attempted intraoperatively in the event of life-threatening complications, including whether or not the patient would be considered a candidate for the use of cardiopulmonary bypass and/or conversion to open sternotomy for attempted surgical intervention.  The patient has been advised of a variety of complications that might develop peculiar to this approach including but not limited to risks of death, stroke, paravalvular leak, aortic dissection or other major vascular complications, aortic annulus rupture, device embolization, cardiac rupture or perforation, acute myocardial infarction, arrhythmia, heart block or bradycardia requiring permanent pacemaker placement, congestive heart failure, respiratory failure, renal failure, pneumonia, infection, other late complications related to structural valve deterioration or migration, or other complications that might ultimately cause a temporary or permanent loss of functional independence or other long term morbidity.  The patient provides full informed consent for the procedure as described and all questions were answered preoperatively.    DETAILS OF THE OPERATIVE PROCEDURE  PREPARATION:   The patient is brought to the operating room on the above mentioned date and central monitoring was established by the anesthesia team including placement of a radial arterial line. The patient is placed in the supine position on the operating table.  Intravenous antibiotics are administered. Conscious sedation is used.   Baseline transthoracic echocardiogram was performed. The patient's chest, abdomen, both groins, and both lower extremities are prepared and draped in a sterile manner. A  time out procedure is performed.   PERIPHERAL ACCESS:   Using the modified Seldinger technique, femoral arterial  and venous access were obtained with placement of 6 Fr sheaths on the left side using u/s guidance.  A pigtail diagnostic catheter was passed through the femoral arterial sheath under fluoroscopic guidance into the aortic root.  A temporary transvenous pacemaker catheter was passed through the femoral venous sheath under fluoroscopic guidance into the right ventricle.  The pacemaker was tested to ensure stable lead placement and pacemaker capture. Aortic root angiography was performed in order to determine the optimal angiographic angle for valve deployment.  TRANSFEMORAL ACCESS:  A micropuncture kit was used to gain access to the right femoral artery using u/s guidance. Pre-closure with double ProGlide closure devices. The patient was heparinized systemically and ACT verified > 250 seconds.    A 14 Fr transfemoral E-sheath was introduced into the right femoral artery after progressively dilating over an Amplatz superstiff wire. An AL-2 catheter was used to direct a straight-tip exchange length wire across the native aortic valve into the left ventricle. This was exchanged out for a pigtail catheter and position was confirmed in the LV apex. Simultaneous LV and Ao pressures were recorded.  The pigtail catheter was then exchanged for an Amplatz Extra-stiff wire in the LV apex.   TRANSCATHETER HEART VALVE DEPLOYMENT:  An Edwards Sapien 3 THV (size 26 mm) was prepared and crimped per manufacturer's guidelines, and the proper orientation of the valve is confirmed on the [employer] delivery system. The valve was advanced through the introducer sheath using normal technique until in an appropriate position in the abdominal aorta beyond the sheath tip. The balloon was then retracted and using the fine-tuning wheel was centered on the valve. The valve was then advanced across the aortic arch  using appropriate flexion of the catheter. The valve was carefully positioned across the aortic valve annulus. The Commander catheter was retracted using normal technique. Once final position of the valve has been confirmed by angiographic assessment, the valve is deployed while temporarily holding respiration and during rapid ventricular pacing to maintain systolic blood pressure < 50 mmHg and pulse pressure < 10 mmHg. The balloon inflation is held for >3 seconds after reaching full deployment volume. Once the balloon has fully deflated the balloon is retracted into the ascending aorta and valve function is assessed using TTE. There is felt to be no paravalvular leak and no central aortic insufficiency.  The patient's hemodynamic recovery following valve deployment is good.  The deployment balloon and guidewire are both removed. Echo demostrated acceptable post-procedural gradients, stable mitral valve function, and no AI.   PROCEDURE COMPLETION:  The sheath was then removed and closure devices were completed. Protamine was administered once femoral arterial repair was complete. The temporary pacemaker, pigtail catheters and femoral sheaths were removed with manual pressure used for hemostasis.   The patient tolerated the procedure well and is transported to the surgical intensive care in stable condition. There were no immediate intraoperative complications. All sponge instrument and needle counts are verified correct at completion of the operation.   No blood products were administered during the operation.  The patient received a total of 80.7 mL of intravenous contrast during the procedure.  Lauree Chandler MD 04/01/2018 9:42 AM

## 2018-04-01 NOTE — Anesthesia Procedure Notes (Signed)
Procedure Name: MAC Date/Time: 04/01/2018 7:38 AM Performed by: White, Amedeo Plenty, CRNA Pre-anesthesia Checklist: Patient identified, Emergency Drugs available, Suction available and Patient being monitored Patient Re-evaluated:Patient Re-evaluated prior to induction Oxygen Delivery Method: Simple face mask

## 2018-04-01 NOTE — Op Note (Signed)
[facility]   MULTIDISCIPLINARY HEART VALVE TEAM   TAVR OPERATIVE NOTE   Date of Procedure:  04/01/2018  Preoperative Diagnosis: Severe Aortic Stenosis   Postoperative Diagnosis: Same   Procedure:    Transcatheter Aortic Valve Replacement - Percutaneous Percutaneous Right Transfemoral Approach  Edwards Sapien 3 THV (size 26 mm, model # [id number], serial # [id number])   Co-Surgeons:  Lauree Chandler, MD and Valentina Gu. Roxy Manns, MD   Anesthesiologist:  Adele Barthel, MD  Pre-operative Echo Findings:  Severe aortic stenosis  Normal left ventricular systolic function  Post-operative Echo Findings:  No paravalvular leak  Normal left ventricular systolic function   BRIEF CLINICAL NOTE AND INDICATIONS FOR SURGERY  Patient is an 84 year old male with aortic stenosis, coronary artery disease status post coronary artery bypass grafting in the remote past, SVT status post ablation, hypertension, hyperlipidemia, and type 2 diabetes mellitus who has been referred for surgical consultation to discuss treatment options for management of severe aortic stenosis.  Patient's cardiac history dates back to 2001 when he presented with symptomatic tachycardia and was diagnosed with SVT.  He underwent a nuclear stress test that was reportedly abnormal.  He subsequently underwent coronary artery bypass grafting x4 for severe three-vessel coronary artery disease.  One month later he underwent ablation for SVT.  At the time he lived in [place] and details of all procedures are not currently available.  He reportedly did quite well.  The patient later developed a heart murmur and was found to have aortic stenosis which has slowly progressed in severity on follow-up echocardiograms.  Approximately 4 years ago he moved to [place] and he has been followed for the last several years by Dr. [name].  He has complained of gradual progression of decreased exercise tolerance with  worsening fatigue.  He remains fairly active physically but he states that he simply gets tired much more easily than he used to.  He occasionally experiences some exertional shortness of breath.  Recent follow-up echocardiogram revealed preserved left ventricular systolic function with progression and severity of aortic stenosis.  Peak velocity across the aortic valve measured 3.6 m/s corresponding to mean transvalvular gradient estimated 29 mmHg.  The DVI was 0.24.  Left ventricular ejection fraction remain normal and was estimated 55-60%.  The patient subsequently underwent TEE to confirm the presence of severe aortic stenosis.  Left ventricular systolic function was felt to be low normal with ejection fraction estimated 50-55%.  By planimetry the aortic valve area was 0.85 cm.  The dimensionless index was reported 0.26.  There was mild aortic insufficiency and mild mitral regurgitation.  The patient was referred to the multidisciplinary heart valve clinic and underwent diagnostic cardiac catheterization by Dr. Angelena Form.  Catheterization revealed severe native three-vessel coronary artery disease with continued patency of previous bypass grafts.  There was 100% proximal occlusion of the right coronary artery.  There was 70% stenosis of the left circumflex coronary artery there was 90% proximal stenosis of the left anterior descending coronary artery.  The left internal mammary artery remained widely patent.  Vein graft to the right coronary artery was diseased but patent and without flow limiting disease.  Vein grafts to the circumflex territory were not visualized but there was competitive flow seen on antegrade injections in the left circumflex to suggest continued patency of these grafts.  Mean transvalvular gradient across the aortic valve was measured 9 mmHg.  Patient subsequently underwent CT angiography and was referred for surgical consultation.  During the course of  the patient's preoperative work up  they have been evaluated comprehensively by a multidisciplinary team of specialists coordinated through the [facility] in the [facility].  They have been demonstrated to suffer from symptomatic severe aortic stenosis as noted above. The patient has been counseled extensively as to the relative risks and benefits of all options for the treatment of severe aortic stenosis including long term medical therapy, conventional surgery for aortic valve replacement, and transcatheter aortic valve replacement.  All questions have been answered, and the patient provides full informed consent for the operation as described.   DETAILS OF THE OPERATIVE PROCEDURE  PREPARATION:    The patient is brought to the operating room on the above mentioned date and central monitoring was established by the anesthesia team including placement of a central venous line and radial arterial line. The patient is placed in the supine position on the operating table.  Intravenous antibiotics are administered. The patient is monitored closely throughout the procedure under conscious sedation.    Baseline transthoracic echocardiogram was performed. The patient's chest, abdomen, both groins, and both lower extremities are prepared and draped in a sterile manner. A time out procedure is performed.   PERIPHERAL ACCESS:    Using the modified Seldinger technique, femoral arterial and venous access was obtained with placement of 6 Fr sheaths on the left side.  A pigtail diagnostic catheter was passed through the left arterial sheath under fluoroscopic guidance into the aortic root.  A temporary transvenous pacemaker catheter was passed through the left femoral venous sheath under fluoroscopic guidance into the right ventricle.  The pacemaker was tested to ensure stable lead placement and pacemaker capture. Aortic root angiography was performed in order to determine the optimal angiographic  angle for valve deployment.   TRANSFEMORAL ACCESS:   Percutaneous transfemoral access and sheath placement was performed by Dr. Angelena Form using ultrasound guidance.  The right common femoral artery was cannulated using a micropuncture needle and appropriate location was verified using hand injection angiogram.  A pair of Abbott Perclose percutaneous closure devices were placed and a 6 French sheath replaced into the femoral artery.  The patient was heparinized systemically and ACT verified > 250 seconds.    A 14 Fr transfemoral E-sheath was introduced into the right common femoral artery after progressively dilating over an Amplatz superstiff wire. An AL-2 catheter was used to direct a straight-tip exchange length wire across the native aortic valve into the left ventricle. This was exchanged out for a pigtail catheter and position was confirmed in the LV apex. Simultaneous LV and Ao pressures were recorded.  The pigtail catheter was exchanged for an Amplatz Extra-stiff wire in the LV apex.  Echocardiography was utilized to confirm appropriate wire position and no sign of entanglement in the mitral subvalvular apparatus.   TRANSCATHETER HEART VALVE DEPLOYMENT:   An Edwards Sapien 3 transcatheter heart valve (size 26 mm, model #9600TFX, serial #[id number]) was prepared and crimped per manufacturer's guidelines, and the proper orientation of the valve is confirmed on the [employer] delivery system. The valve was advanced through the introducer sheath using normal technique until in an appropriate position in the abdominal aorta beyond the sheath tip. The balloon was then retracted and using the fine-tuning wheel was centered on the valve. The valve was then advanced across the aortic arch using appropriate flexion of the catheter. The valve was carefully positioned across the aortic valve annulus. The Commander catheter was retracted using normal technique.  Once final position of the valve has been  confirmed by angiographic assessment, the valve is deployed while temporarily holding ventilation and during rapid ventricular pacing to maintain systolic blood pressure < 50 mmHg and pulse pressure < 10 mmHg. The balloon inflation is held for >3 seconds after reaching full deployment volume. Once the balloon has fully deflated the balloon is retracted into the ascending aorta and valve function is assessed using echocardiography. There is felt to be no paravalvular leak and no central aortic insufficiency.  The patient's hemodynamic recovery following valve deployment is good.  The deployment balloon and guidewire are both removed.    PROCEDURE COMPLETION:   The sheath was removed and femoral artery closure performed by Dr Angelena Form.  Protamine was administered once femoral arterial repair was complete. The temporary pacemaker, pigtail catheters and femoral sheaths were removed with manual pressure used for hemostasis.   The patient tolerated the procedure well and is transported to the surgical intensive care in stable condition. There were no immediate intraoperative complications. All sponge instrument and needle counts are verified correct at completion of the operation.   No blood products were administered during the operation.  The patient received a total of 80.7 mL of intravenous contrast during the procedure.   Rexene Alberts, MD 04/01/2018 9:36 AM

## 2018-04-01 NOTE — Anesthesia Postprocedure Evaluation (Signed)
Anesthesia Post Note  Patient: Peter Scott  Procedure(s) Performed: TRANSCATHETER AORTIC VALVE REPLACEMENT, TRANSFEMORAL (N/A Chest) INTRAOPERATIVE TRANSTHORACIC ECHOCARDIOGRAM (N/A )     Patient location during evaluation: ICU Anesthesia Type: MAC Level of consciousness: awake and alert Pain management: pain level controlled Vital Signs Assessment: post-procedure vital signs reviewed and stable Respiratory status: spontaneous breathing, nonlabored ventilation, respiratory function stable and patient connected to nasal cannula oxygen Cardiovascular status: stable and blood pressure returned to baseline Postop Assessment: no apparent nausea or vomiting Anesthetic complications: no    Last Vitals:  Vitals:   04/01/18 1145 04/01/18 1200  BP:  (!) 118/59  Pulse: (!) 53 (!) 48  Resp: 17 16  Temp:  36.7 C  SpO2: 97% 98%    Last Pain:  Vitals:   04/01/18 1200  TempSrc: Oral  PainSc: Asleep                 Cornelious Bartolucci P Pa Tennant

## 2018-04-02 ENCOUNTER — Inpatient Hospital Stay (HOSPITAL_COMMUNITY): Payer: Medicare Other

## 2018-04-02 ENCOUNTER — Encounter (HOSPITAL_COMMUNITY): Payer: Self-pay | Admitting: Cardiovascular Disease

## 2018-04-02 DIAGNOSIS — I350 Nonrheumatic aortic (valve) stenosis: Secondary | ICD-10-CM

## 2018-04-02 DIAGNOSIS — I361 Nonrheumatic tricuspid (valve) insufficiency: Secondary | ICD-10-CM

## 2018-04-02 DIAGNOSIS — Z954 Presence of other heart-valve replacement: Secondary | ICD-10-CM

## 2018-04-02 LAB — BASIC METABOLIC PANEL
ANION GAP: 6 (ref 5–15)
BUN: 16 mg/dL (ref 6–20)
CALCIUM: 8.3 mg/dL — AB (ref 8.9–10.3)
CO2: 27 mmol/L (ref 22–32)
Chloride: 104 mmol/L (ref 101–111)
Creatinine, Ser: 1.22 mg/dL (ref 0.61–1.24)
GFR, EST NON AFRICAN AMERICAN: 53 mL/min — AB (ref >60)
GLUCOSE: 140 mg/dL — AB (ref 65–99)
POTASSIUM: 4.1 mmol/L (ref 3.5–5.1)
Sodium: 137 mmol/L (ref 135–145)

## 2018-04-02 LAB — ECHOCARDIOGRAM COMPLETE
HEIGHTINCHES: 68 in
WEIGHTICAEL: 2777.6 [oz_av]

## 2018-04-02 LAB — CBC
HEMATOCRIT: 35.8 % — AB (ref 39.0–52.0)
Hemoglobin: 12.1 g/dL — ABNORMAL LOW (ref 13.0–17.0)
MCH: 32.8 pg (ref 26.0–34.0)
MCHC: 33.8 g/dL (ref 30.0–36.0)
MCV: 97 fL (ref 78.0–100.0)
Platelets: 124 10*3/uL — ABNORMAL LOW (ref 150–400)
RBC: 3.69 MIL/uL — AB (ref 4.22–5.81)
RDW: 12.1 % (ref 11.5–15.5)
WBC: 8.8 10*3/uL (ref 4.0–10.5)

## 2018-04-02 LAB — GLUCOSE, CAPILLARY
GLUCOSE-CAPILLARY: 116 mg/dL — AB (ref 65–99)
GLUCOSE-CAPILLARY: 269 mg/dL — AB (ref 65–99)

## 2018-04-02 LAB — MAGNESIUM: MAGNESIUM: 1.9 mg/dL (ref 1.7–2.4)

## 2018-04-02 MED ORDER — INSULIN ASPART 100 UNIT/ML ~~LOC~~ SOLN
0.0000 [IU] | Freq: Three times a day (TID) | SUBCUTANEOUS | Status: DC
  Administered 2018-04-02: 8 [IU] via SUBCUTANEOUS

## 2018-04-02 MED ORDER — CLOPIDOGREL BISULFATE 75 MG PO TABS: 75.0000 mg | ORAL_TABLET | Freq: Every day | ORAL | 1 refills | Status: DC

## 2018-04-02 MED ORDER — ACETAMINOPHEN 500 MG PO TABS: 500.0000 mg | ORAL_TABLET | Freq: Four times a day (QID) | ORAL | 0 refills | Status: DC | PRN

## 2018-04-02 MED FILL — Sodium Chloride IV Soln 0.9%: INTRAVENOUS | Qty: 250 | Status: AC

## 2018-04-02 MED FILL — Magnesium Sulfate Inj 50%: INTRAMUSCULAR | Qty: 10 | Status: AC

## 2018-04-02 MED FILL — Potassium Chloride Inj 2 mEq/ML: INTRAVENOUS | Qty: 40 | Status: AC

## 2018-04-02 MED FILL — Heparin Sodium (Porcine) Inj 1000 Unit/ML: INTRAMUSCULAR | Qty: 30 | Status: AC

## 2018-04-02 MED FILL — Phenylephrine HCl IV Soln 10 MG/ML: INTRAVENOUS | Qty: 2 | Status: AC

## 2018-04-02 NOTE — Progress Notes (Signed)
Patient given discharge instructions medication list and paper prescriptions along with follow up appointments. Patient verbalized understanding. All questions answered IV and tele dcd will discharge home as ordered transported to exit with wheel chair and nursing staff. Nelda Bucks, Bettina Gavia RN

## 2018-04-02 NOTE — Progress Notes (Signed)
  Echocardiogram 2D Echocardiogram has been performed.  Peter Scott 04/02/2018, 10:14 AM

## 2018-04-02 NOTE — Progress Notes (Signed)
Patient with 6 beats of Vtach on monitor, Jadene Pierini [facility] made aware, stated OK to discharge. Ravynn Hogate, Bettina Gavia RN

## 2018-04-02 NOTE — Discharge Instructions (Signed)
ACTIVITY AND EXERCISE °• Daily activity and exercise are an important part °of your recovery. People recover at different rates °depending on their general health and type of °valve procedure. °• Most people require six to 10 weeks to feel °recovered. °• No lifting, pushing, pulling more than 10 pounds °(examples to avoid: groceries, vacuuming, °gardening, golfing): °- For one week with a procedure through the groin. °- For six weeks for procedures through the chest °wall. °- For three months for procedures through the °breast-bone. °• After the initial healing process of the access site, °we recommend cardiac rehabilitation for all TAVR °patients. Cardiac rehabilitation will help you: °- Rebuild stamina, strength and balance. °- Learn how to participate in activities safely, as well °as help you regain confidence to do so. °- Return to activities of daily living and leisure. °• Discuss attending cardiac rehabilitation at your °follow-up appointment  ° °DRIVING °• Do not drive for four weeks after the date of your °procedure. °• If you have been told by your doctor in the past °that you may not drive, you must talk with him/her °before you begin driving again. °• When you resume driving, you must have someone °with you. ° °HYGIENE °If you had a femoral (leg) procedure, you may take a shower when you return home. After the shower, pat the °site dry. Do NOT use powder, oils or lotions in your groin area until the site has completely healed. °• If you had a chest procedure, you may shower when you return home unless specifically instructed not to by °your discharging practitioner. °- DO NOT scrub incision; pat dry with a towel °- DO NOT apply any lotions, oils, powders to the incision °- No tub baths / swimming for at least six weeks. ° °SITE CARE °• You likely will have small openings in both groins °from catheters used during the procedure. If you °had a transfemoral procedure, one groin will have a °larger opening  and may be bruised or tender. °• If you had a chest procedure, you will have either a °small incision in your upper sternum (breast-bone) °or between your ribs on your left side. °- Chest wall site: The surgical incision should be °kept dry (no lotions / oils / powders) and open °to air. If you experience irritation from clothing °rubbing on the incision, a light gauze dressing °may be applied. °- Inspect your incision daily; notify your °physician if there is increased redness, swelling °or drainage from the incision. °- If the incision is located on your breast-bone °you must avoid lifting objects heavier than a °gallon of milk (eight pounds) and stretching / °twisting / pulling with your arms for at least °three months to ensure strong bone healing. ° ° °CONTACT [phone]- °• Check your sites daily. Contact our office if you have °any of the following problems: °- Redness and warmth that does not go away °- Yellow or green drainage from the wound °- Fever and chills °- Increasing numbness in your legs °- Worsening pain at the site °• If you had a leg/groin procedure, it is normal to °have bruising or a soft lump at the site. It is not °normal if the lump suddenly becomes larger or °more firm. This may mean you are bleeding. If this °happens: °- Lie down °- Have someone press down hard, just above °the hole in your skin where the procedure was °performed for 15 minutes. If after holding on the °site, the lump does not become larger or harder, °they   are performing this correctly. °- If the bleeding has stopped after 15 minutes, rest °and stay laying down for at least two hours. °- If the bleeding continues, call 911 for an °ambulance. Do NOT drive yourself or have °someone else drive you. °

## 2018-04-02 NOTE — Discharge Summary (Addendum)
Physician Discharge Summary       [address]       [place] [address]             [phone]    Patient ID: Peter Scott MRN: [id number] DOB/AGE: 82-Aug-1933 82 y.o.  Admit date: 04/01/2018 Discharge date: 04/02/2018  Admission Diagnoses: Severe aortic stenosis  Discharge Diagnoses:  1. S/P TAVR (transcatheter aortic valve replacement) 2. Anemia 3. History of essential hypertension, benign 4. History of coronary artery disease-S/P CABG x 4 5. History of Type II diabetes mellitus, well controlled ([place]) 6. History of hyperlipidemia 7. History of umbilical hernia    Procedure (s):    Transcatheter Aortic Valve Replacement - Percutaneous Percutaneous Right Transfemoral Approach             Edwards Sapien 3 THV (size 26 mm, model # 9600TFX, serial # [id number]) by Dr. Roxy Manns and Angelena Form on 04/01/2018.  History of Presenting Illness: Patient is an 84 year old male with aortic stenosis, coronary artery disease status post coronary artery bypass grafting in the remote past, SVT status post ablation, hypertension, hyperlipidemia, and type 2 diabetes mellitus who has been referred for surgical consultation to discuss treatment options for management of severe aortic stenosis.  Patient's cardiac history dates back to 2001 when he presented with symptomatic tachycardia and was diagnosed with SVT.  He underwent a nuclear stress test that was reportedly abnormal.  He subsequently underwent coronary artery bypass grafting x4 for severe three-vessel coronary artery disease.  One month later he underwent ablation for SVT.  At the time he lived in [place] and details of all procedures are not currently available.  He reportedly did quite well.  The patient later developed a heart murmur and was found to have aortic stenosis which has slowly progressed in severity on follow-up echocardiograms.  Approximately 4 years ago he moved to [place] and he has been followed for the  last several years by Dr. [name].  He has complained of gradual progression of decreased exercise tolerance with worsening fatigue.  He remains fairly active physically but he states that he simply gets tired much more easily than he used to.  He occasionally experiences some exertional shortness of breath.  Recent follow-up echocardiogram revealed preserved left ventricular systolic function with progression and severity of aortic stenosis.  Peak velocity across the aortic valve measured 3.6 m/s corresponding to mean transvalvular gradient estimated 29 mmHg.  The DVI was 0.24.  Left ventricular ejection fraction remain normal and was estimated 55-60%.  The patient subsequently underwent TEE to confirm the presence of severe aortic stenosis.  Left ventricular systolic function was felt to be low normal with ejection fraction estimated 50-55%.  By planimetry the aortic valve area was 0.85 cm.  The dimensionless index was reported 0.26.  There was mild aortic insufficiency and mild mitral regurgitation.  The patient was referred to the multidisciplinary heart valve clinic and underwent diagnostic cardiac catheterization by Dr. Angelena Form.  Catheterization revealed severe native three-vessel coronary artery disease with continued patency of previous bypass grafts.  There was 100% proximal occlusion of the right coronary artery.  There was 70% stenosis of the left circumflex coronary artery there was 90% proximal stenosis of the left anterior descending coronary artery.  The left internal mammary artery remained widely patent.  Vein graft to the right coronary artery was diseased but patent and without flow limiting disease.  Vein grafts to the circumflex territory were not visualized but there was competitive flow  seen on antegrade injections in the left circumflex to suggest continued patency of these grafts.  Mean transvalvular gradient across the aortic valve was measured 9 mmHg.  Patient subsequently underwent CT  angiography and was referred for surgical consultation.  The patient is married and lives locally in [place] with his wife.  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  The patient remains quite active physically and enjoys working in the yard and around the house.  He spends a great deal of time working on his son's farm.  He complains that over the last few years he has had a slow gradual decline in his exercise tolerance with worsening fatigue and tendency to get exhausted.  He reports some exertional shortness of breath but only with strenuous physical activity.  He denies any history of chest pain or chest tightness either with activity or at rest.  He has had 1 or 2 brief dizzy spells but no history of syncope.  He denies any history of PND, orthopnea, or lower extremity edema. The patient and his wife counseled at length regarding treatment alternatives for management of moderate-severe symptomatic aortic stenosis. Alternative approaches such as conventional aortic valve replacement, transcatheter aortic valve replacement, and continued medical therapy without intervention were compared and contrasted at length.  The risks associated with conventional surgical aortic valve replacement were discussed in detail, as were expectations for post-operative convalescence, and why I would be reluctant to consider this patient a candidate for conventional surgery.  Issues specific to transcatheter aortic valve replacement were discussed including questions about long term valve durability, the potential for paravalvular leak, possible increased risk of need for permanent pacemaker placement, and other technical complications related to the procedure itself.  Long-term prognosis with medical therapy was discussed. This discussion was placed in the context of the patient's own specific clinical presentation and past medical history.  All of their questions have been  addressed.  The patient desires to proceed with transcatheter aortic valve replacement.   Brief Hospital Course:  Patient remained afebrile and hemodynamically stable. Post op EKG showed SB without heart block. He was started on baby ec asa and Plavix. He was also restarted on Spironolactone and Losartan. As discussed with Dr. Roxy Manns, will restart Amlodipine for better BP control. He has a history of diabetes. He has been restarted on Glyburide. He was instructed to restart Metformin as taken prior to surgery either tonight with dinner or in the am. His pre op was HGA1C 7.6. Echo was obtained on 05/15. Results showed LVEF 60-65%, trivial MR, mild perivalvular AR, and TAVR well seated valve and normal function. Dr. Roxy Manns reviewed this echo.   Right groin wound is clean and dry. Patient instructed to remove all dressings and shower in the am (05/16). Per Dr.  Roxy Manns, patient is felt surgically stable for discharge today.   Latest Vital Signs: Blood pressure (!) 144/50, pulse (!) 53, temperature 97.8 F (36.6 C), temperature source Oral, resp. rate 17, height 5\' 8"  (1.727 m), weight 173 lb 9.6 oz (78.7 kg), SpO2 100 %.  Physical Exam: Rhythm:                       Sinus              Breath sounds:            clear             Heart sounds:  RRR w/out murmur             Incisions:                     Both groins okay             Abdomen:                    Soft, non-distended, non-tender             Extremities:                 Warm, well-perfused    Discharge Condition:Stable and discharged to home.  Recent laboratory studies:  Lab Results  Component Value Date   WBC 8.8 04/02/2018   HGB 12.1 (L) 04/02/2018   HCT 35.8 (L) 04/02/2018   MCV 97.0 04/02/2018   PLT 124 (L) 04/02/2018   Lab Results  Component Value Date   NA 137 04/02/2018   K 4.1 04/02/2018   CL 104 04/02/2018   CO2 27 04/02/2018   CREATININE 1.22 04/02/2018   GLUCOSE 140 (H) 04/02/2018     Diagnostic  Studies: ------------------------------------------------------------------- Transthoracic Echocardiography  Patient:    Rune, Mendez MR #:       [id number] Study Date: 04/02/2018 Gender:     M Age:        26 Height:     172.7 cm Weight:     78.7 kg BSA:        1.96 m^2 Pt. Status: Room:       2H22C   ADMITTING    [place], [place], [place], [place], Inpatient  SONOGRAPHER  Chelsea Androw  cc:  ------------------------------------------------------------------- LV EF: 60% -   65%  ------------------------------------------------------------------- Indications:      (Post TAVR evaluation).  ------------------------------------------------------------------- History:   PMH:  History of aortic valve disease Edwards sapien 36mm.  Coronary artery disease.  Coronary artery disease.  Risk factors:  Hypertension. Dyslipidemia.  ------------------------------------------------------------------- Study Conclusions  - Left ventricle: The cavity size was normal. Systolic function was   normal. The estimated ejection fraction was in the range of 60%   to 65%. Wall motion was normal; there were no regional wall   motion abnormalities. Features are consistent with a pseudonormal   left ventricular filling pattern, with concomitant abnormal   relaxation and increased filling pressure (grade 2 diastolic   dysfunction). Doppler parameters are consistent with high   ventricular filling pressure. - Aortic valve: S/P Edwards Sapiein 70mm TAVR with well seated   valve and normal function. There is mild perivalvular AR. Mean   gradient (S): 13 mm Hg. Valve area (VTI): 2.62 cm^2. Valve area   (Vmax): 2.63 cm^2. Valve area (Vmean): 2.76 cm^2. - Mitral valve: There was trivial regurgitation. Valve area by   pressure half-time: 2.44 cm^2. - Left atrium: The atrium was  mildly dilated. - Tricuspid valve: There was mild regurgitation. - Pulmonary arteries: PA peak pressure: 35 mm Hg (S).  Impressions:  - Compared to prior echo a TAVR is now present.  ------------------------------------------------------------------- Labs, prior tests, procedures, and surgery: s/p CABG x 4  ------------------------------------------------------------------- Study data:  The previous study was not available, so comparison was made to the report of 04/01/2018.  Study status:  Routine. Procedure:  The patient reported no pain pre or post test. Transthoracic echocardiography.  Image quality was adequate.  Study completion:  There were no complications.          Transthoracic echocardiography.  M-mode, complete 2D, spectral Doppler, and color Doppler.  Birthdate:  Patient birthdate: [birth year].  Age:  Patient is 84 yr old.  Sex:  Gender: male.    BMI: 26.4 kg/m^2.  Blood pressure:     116/52  Patient status:  Inpatient.  Study date: Study date: 04/02/2018. Study time: 09:36 AM.  Location:  ICU/CCU   -------------------------------------------------------------------  ------------------------------------------------------------------- Left ventricle:  The cavity size was normal. Systolic function was normal. The estimated ejection fraction was in the range of 60% to 65%. Wall motion was normal; there were no regional wall motion abnormalities. Features are consistent with a pseudonormal left ventricular filling pattern, with concomitant abnormal relaxation and increased filling pressure (grade 2 diastolic dysfunction). Doppler parameters are consistent with high ventricular filling pressure.  ------------------------------------------------------------------- Aortic valve:  S/P Edwards Sapiein 47mm TAVR with well seated valve and normal function. There is mild perivalvular AR.  Doppler: Transvalvular velocity was within the normal range. There was no stenosis.     VTI ratio of LVOT to aortic valve: 0.84. Valve area (VTI): 2.62 cm^2. Indexed valve area (VTI): 1.34 cm^2/m^2. Peak velocity ratio of LVOT to aortic valve: 0.84. Valve area (Vmax): 2.63 cm^2. Indexed valve area (Vmax): 1.34 cm^2/m^2. Mean velocity ratio of LVOT to aortic valve: 0.88. Valve area (Vmean): 2.76 cm^2. Indexed valve area (Vmean): 1.41 cm^2/m^2.    Mean gradient (S): 13 mm Hg. Peak gradient (S): 28 mm Hg.  ------------------------------------------------------------------- Aorta:  Aortic root: The aortic root was normal in size.  ------------------------------------------------------------------- Mitral valve:   Structurally normal valve.   Mobility was not restricted.  Doppler:  Transvalvular velocity was within the normal range. There was no evidence for stenosis. There was trivial regurgitation.    Valve area by pressure half-time: 2.44 cm^2. Indexed valve area by pressure half-time: 1.25 cm^2/m^2.    Peak gradient (D): 5 mm Hg.  ------------------------------------------------------------------- Left atrium:  The atrium was mildly dilated.  ------------------------------------------------------------------- Right ventricle:  The cavity size was normal. Wall thickness was normal. Systolic function was normal.  ------------------------------------------------------------------- Pulmonic valve:    Structurally normal valve.   Cusp separation was normal.  Doppler:  Transvalvular velocity was within the normal range. There was no evidence for stenosis. There was no regurgitation.  ------------------------------------------------------------------- Tricuspid valve:   Structurally normal valve.    Doppler: Transvalvular velocity was within the normal range. There was mild regurgitation.  ------------------------------------------------------------------- Pulmonary artery:   The main pulmonary artery was normal-sized. Systolic pressure was within the normal  range.  ------------------------------------------------------------------- Right atrium:  The atrium was normal in size.  ------------------------------------------------------------------- Pericardium:  There was no pericardial effusion.  ------------------------------------------------------------------- Systemic veins: Inferior vena cava: The vessel was normal in size.   Dg Chest 2 View  Result Date: 03/26/2018 CLINICAL DATA:  Dyspnea and palpitations with irregular heart beat per patient. Patient scheduled for aortic valve replacement in 6 days. EXAM: CHEST - 2 VIEW COMPARISON:  02/12/2018 FINDINGS: Low lung volumes with bibasilar atelectasis. No pulmonary consolidation or overt pulmonary edema. No effusion or pneumothorax. Tortuous atherosclerotic aorta without aneurysmal dilatation identified. Moderate-sized hiatal hernia is noted. Median sternotomy sutures and post CABG noted as before. Acute osseous abnormality. Degenerative changes are present in the dorsal spine. IMPRESSION: 1. Low lung volumes with bibasilar atelectasis. 2. No pneumonia or CHF. 3. Aortic atherosclerosis. 4. Moderate-sized hiatal hernia Electronically Signed   By: Shanon Brow  Randel Pigg M.D.   On: 03/26/2018 22:38   Dg Chest Port 1 View  Result Date: 04/01/2018 CLINICAL DATA:  Status post transcatheter aortic valve replacement. History of coronary artery disease, aortic stenosis, previous CABG. EXAM: PORTABLE CHEST 1 VIEW COMPARISON:  Chest x-ray of Mar 26, 2018 FINDINGS: The lungs remain hypoinflated. The interstitial markings are coarse though stable. There is a small left pleural effusion. The heart is mildly enlarged. The pulmonary vascularity is not clearly engorged. A prosthetic aortic valve cage is visible. There is calcification in the wall of the aortic arch. The sternal wires are intact. The right internal jugular venous catheter tip projects over the junction of the middle and distal thirds of the SVC. IMPRESSION: Mild  hypoinflation. Allowing for differences in radiographic technique and positioning no acute postprocedure complication is observed. Electronically Signed   By: David  Martinique M.D.   On: 04/01/2018 10:03      Discharge Medications: Allergies as of 04/02/2018   No Known Allergies     Medication List    TAKE these medications   acetaminophen 500 MG tablet Commonly known as:  TYLENOL Take 1 tablet (500 mg total) by mouth every 6 (six) hours as needed.   amLODipine 10 MG tablet Commonly known as:  NORVASC TAKE 1/2 TABLET BY MOUTH DAILY   aspirin EC 81 MG tablet Take 81 mg by mouth daily.   atorvastatin 80 MG tablet Commonly known as:  LIPITOR Take 0.5 tablets (40 mg total) by mouth at bedtime.   clopidogrel 75 MG tablet Commonly known as:  PLAVIX Take 1 tablet (75 mg total) by mouth daily with breakfast. Start taking on:  04/03/2018   glucose blood test strip Commonly known as:  BAYER CONTOUR TEST TEST ONCE DAILY FOR DIABETES Dx Code: E11.9   glyBURIDE 5 MG tablet Commonly known as:  DIABETA TAKE 1/2 (ONE-HALF) TABLET BY MOUTH TWICE DAILY WITH MEALS   losartan 100 MG tablet Commonly known as:  COZAAR TAKE 1/2 TABLET BY MOUTH 2 TIMES A DAY   metFORMIN 500 MG tablet Commonly known as:  GLUCOPHAGE TAKE 2 TABLETS BY MOUTH EVERY MORNING THEN TAKE 1 TABLET BY MOUTH EVERY EVENING   spironolactone 25 MG tablet Commonly known as:  ALDACTONE Take 0.5 tablets (12.5 mg total) by mouth daily.      The patient has been discharged on:   1.Beta Blocker:  Yes [   ]                              No   [  x ]                              If No, reason:Bradycardia  2.Ace Inhibitor/ARB: Yes [ x  ]                                     No  [    ]                                     If No, reason:  3.Statin:   Yes [x ]                  No  [   ]  If No, reason:  4.Ecasa:  Yes  [ x  ]                  No   [   ]                  If No, reason:  Follow Up  Appointments: Follow-up Information    Eileen Stanford, PA-C. Go on 04/16/2018.   Specialties:  Cardiology, Radiology Why:  Appointment time is at 1:30 pm Contact information: [address] [place] [phone]           Signed: Sharalyn Ink [facility] 04/02/2018, 2:50 PM

## 2018-04-02 NOTE — Progress Notes (Signed)
Echo done and reviewed by Dr. Roxy Manns. As discussed with Dr. Roxy Manns, patient is surgically stable for discharge. Patient wishes to go home. Will arrange for discharge.

## 2018-04-02 NOTE — Progress Notes (Signed)
Pt walked independently with RN 740 ft about an hour ago. Feels well, no SOB per pt. Ed completed with pt and wife including restrictions, walking gl, and CRPII. He goes to gym daily so not interested in [place] (it would also be a drive for him). Good reception. Gave them diet sheets. [place], ACSM 1:23 PM 04/02/2018

## 2018-04-02 NOTE — Progress Notes (Addendum)
      [address]       [place] [address]             [phone]        CARDIOTHORACIC SURGERY PROGRESS NOTE   R1 Day Post-Op Procedure(s) (LRB): TRANSCATHETER AORTIC VALVE REPLACEMENT, TRANSFEMORAL (N/A) INTRAOPERATIVE TRANSTHORACIC ECHOCARDIOGRAM (N/A)  Subjective: Looks good and feels well  Objective: Vital signs: BP Readings from Last 1 Encounters:  04/02/18 (!) 158/58   Pulse Readings from Last 1 Encounters:  04/02/18 (!) 53   Resp Readings from Last 1 Encounters:  04/02/18 (!) 22   Temp Readings from Last 1 Encounters:  04/02/18 98.6 F (37 C) (Oral)    Hemodynamics:    Physical Exam:  Rhythm:   Sinus   Breath sounds: clear  Heart sounds:  RRR w/out murmur  Incisions:  Both groins okay  Abdomen:  Soft, non-distended, non-tender  Extremities:  Warm, well-perfused    Intake/Output from previous day: 05/14 0701 - 05/15 0700 In: 1859.7 [P.O.:480; I.V.:707.5; IV Piggyback:672.2] Out: 2140 [Urine:2125; Blood:15] Intake/Output this shift: Total I/O In: 240 [P.O.:240] Out: -   Lab Results:  CBC: Recent Labs    04/01/18 1000 04/02/18 0344  WBC 6.8 8.8  HGB 12.4* 12.1*  HCT 36.8* 35.8*  PLT 143* 124*    BMET:  Recent Labs    04/01/18 0922 04/01/18 0953 04/02/18 0344  NA 137 137 137  K 4.7 4.6 4.1  CL 101  --  104  CO2  --   --  27  GLUCOSE 189* 188* 140*  BUN 17  --  16  CREATININE 1.00  --  1.22  CALCIUM  --   --  8.3*     PT/INR:   Recent Labs    04/01/18 1000  LABPROT 14.9  INR 1.18    CBG (last 3)  Recent Labs    04/01/18 0553  GLUCAP 155*    ABG    Component Value Date/Time   PHART 7.422 03/28/2018 1033   PCO2ART 37.2 03/28/2018 1033   PO2ART 111 (H) 03/28/2018 1033   HCO3 23.8 03/28/2018 1033   TCO2 24 04/01/2018 0922   ACIDBASEDEF 0.1 03/28/2018 1033   O2SAT 98.0 03/28/2018 1033    CXR: PORTABLE CHEST 1 VIEW  COMPARISON:  Chest x-ray of Mar 26, 2018  FINDINGS: The lungs remain  hypoinflated. The interstitial markings are coarse though stable. There is a small left pleural effusion. The heart is mildly enlarged. The pulmonary vascularity is not clearly engorged. A prosthetic aortic valve cage is visible. There is calcification in the wall of the aortic arch. The sternal wires are intact. The right internal jugular venous catheter tip projects over the junction of the middle and distal thirds of the SVC.  IMPRESSION: Mild hypoinflation. Allowing for differences in radiographic technique and positioning no acute postprocedure complication is observed.   Electronically Signed   By: David  Martinique M.D.   On: 04/01/2018 10:03   EKG: Sinus bradycardia w/out acute ischemic changes or significant heart block   Assessment/Plan: S/P Procedure(s) (LRB): TRANSCATHETER AORTIC VALVE REPLACEMENT, TRANSFEMORAL (N/A) INTRAOPERATIVE TRANSTHORACIC ECHOCARDIOGRAM (N/A)  Doing well POD1 TAVR Mobilize Resume home dose ARB and spironolactone Hold amlodipine and plan to resume later today or tomorrow if BP will allow ASA and Plavix Routine ECHO Transfer 4E Anticipate likely d/c home later today or tomorrow  Rexene Alberts, MD 04/02/2018 9:13 AM

## 2018-04-02 NOTE — Care Management Note (Signed)
Case Management Note Marvetta Gibbons RN,BSN Unit [facility] 1-22 Case Manager  [phone]  Patient Details  Name: Peter Scott MRN: [id number] Date of Birth: [birth year]  Subjective/Objective:    Pt admitted s/p TAVR                Action/Plan: PTA pt lived at home with spouse- anticipate return home- CM to follow for transition of care needs  Expected Discharge Date:  04/03/18               Expected Discharge Plan:  Home/Self Care  In-House Referral:     Discharge planning Services  CM Consult  Post Acute Care Choice:    Choice offered to:     DME Arranged:    DME Agency:     HH Arranged:    [place] Agency:     Status of Service:  In process, will continue to follow  If discussed at Long Length of Stay Meetings, dates discussed:    Discharge Disposition:   Additional Comments:  Dawayne Patricia, RN 04/02/2018, 11:24 AM

## 2018-04-03 ENCOUNTER — Telehealth: Payer: Self-pay

## 2018-04-03 MED FILL — CLOPIDOGREL 75 MG TABLET: 75 | 30 days supply | Qty: 30 | Fill #0

## 2018-04-03 NOTE — Telephone Encounter (Signed)
Patient contacted regarding discharge from [facility] on 04/02/2018.  Patient understands to follow up with provider Angelena Form PA-C on 04/16/2018 at 1:30 PM  at [facility] office. Patient understands discharge instructions? yes Patient understands medications and regiment? yes Patient understands to bring all medications to this visit? yes  The pt is doing well today with no complaints. The pt is also aware of apts on 6/20 for Echo and OV.  The pt will contact me with any additional questions or concerns.

## 2018-04-07 MED FILL — ATORVASTATIN 80 MG TABLET: 80 | 90 days supply | Qty: 45 | Fill #3

## 2018-04-15 NOTE — Progress Notes (Signed)
[facility] [place]                                       Cardiology Office Note    Date:  04/16/2018   ID:  Peter Scott, DOB 1932/03/04, MRN [id number]  PCP:  [name], MD  Cardiologist:  Dr. [name] / Dr. Angelena Form & Dr. Roxy Manns ( TAVR )    CC: TOC s/p TAVR   History of Present Illness:  Peter Scott is a 84 y.o. male with a history of CAD s/p CABG (2001), SVT s/p ablation, HTN, HLD, DMT2, and severe AS s/p TAVR ( 04/01/18) who presents to clinic for post hospital follow up.   He developed a heart murmur and was diagnosed with aortic stenosis which has slowly progressed in severity on follow-up echocardiograms. He had a gradual worsening of fatigue and decreased exercise tolerance with exertional SOB. Recent follow-up echocardiogram revealed preserved left ventricular systolic function with progression and severity of aortic stenosis. Peak velocity across the aortic valve measured 3.6 m/s corresponding to mean transvalvular gradient estimated 29 mmHg. The DVI was 0.24. Left ventricular ejection fraction remain normal and was estimated 55-60%. The patient subsequently underwent TEE to confirm the presence of severe aortic stenosis. Left ventricular systolic function was felt to be low normal with ejection fraction estimated 50-55%. By planimetry the aortic valve area was 0.85 cm. The dimensionless index was reported 0.26. There was mild aortic insufficiency and mild mitral regurgitation. The patient was referred to the multidisciplinary heart valve clinic and underwent diagnostic cardiac catheterization by Dr. Angelena Form.Catheterization revealed severe native three-vessel coronary artery disease with continued patency of previous bypass grafts.  He underwent successful TAVR with a 26 mm Edwards Sapien THV via the TF approach on 04/01/18. Post operative echo showed EF 60%, normally functioning TAVR valve with no PVL; mean gradient 13 mmHg. He  was discharged on ASA and plavix.   Today he presents to clinic for follow up. He has been doing okay. He has had ups and downs. He has had days of feeling great and some days he has been more tired. He has a cough as well. He is coughing up clear mucus. No fevers or chills. This has been going on since last fall but worsened recently. He had an episode of palpitations on sat night and felt terrible. 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 Cough is not worse when in a supine position. He and his wife are concerned with the cough. He has had a lot of improvement in energy. No CP. No LE edema, orthopnea or PND. No dizziness or syncope. No blood in stool or urine. He stays very active and he and his wife work out at [employer] every day.    Past Medical History:  Diagnosis Date  . Aortic aneurysm ([place])   . Aortic stenosis   . CAD (coronary artery disease)   . Diabetes mellitus type II, controlled ([place])   . Heart disease    Ablation  . History of chicken pox   . Hyperlipidemia   . Hypertension   . S/P CABG x 4    2001 - [place]  . 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   26 mm Edwards Sapien 3 transcatheter heart valve placed via  percutaneous right transfemoral approach   . Umbilical hernia     Past Surgical History:  Procedure Laterality Date  . ABLATION     Rhythm problem; rhythm unknown; [place] GRAFT  2001  . EXPLORATION POST OPERATIVE OPEN HEART  2001, June  . INTRAOPERATIVE TRANSTHORACIC ECHOCARDIOGRAM N/A 04/01/2018   Procedure: INTRAOPERATIVE TRANSTHORACIC ECHOCARDIOGRAM;  Surgeon: Burnell Blanks, MD;  Location: [facility];  Service: Open Heart Surgery;  Laterality: N/A;  . RIGHT/LEFT HEART CATH AND CORONARY/GRAFT ANGIOGRAPHY N/A 01/31/2018   Procedure: RIGHT/LEFT HEART CATH AND CORONARY/GRAFT ANGIOGRAPHY;  Surgeon: Burnell Blanks, MD;  Location: [facility]  CV LAB;  Service: Cardiovascular;  Laterality: N/A;  . TEE WITHOUT CARDIOVERSION N/A 01/17/2018   Procedure: TRANSESOPHAGEAL ECHOCARDIOGRAM (TEE);  Surgeon: Lelon Perla, MD;  Location: [facility] ENDOSCOPY;  Service: Cardiovascular;  Laterality: N/A;  . TONSILLECTOMY    . TRANSCATHETER AORTIC VALVE REPLACEMENT, TRANSFEMORAL N/A 04/01/2018   Procedure: TRANSCATHETER AORTIC VALVE REPLACEMENT, TRANSFEMORAL;  Surgeon: Burnell Blanks, MD;  Location: [facility];  Service: Open Heart Surgery;  Laterality: N/A;  . UMBILICAL HERNIA REPAIR  2015    Current Medications: Outpatient Medications Prior to Visit  Medication Sig Dispense Refill  . acetaminophen (TYLENOL) 500 MG tablet Take 1 tablet (500 mg total) by mouth every 6 (six) hours as needed. 30 tablet 0  . aspirin EC 81 MG tablet Take 81 mg by mouth daily.    Marland Kitchen atorvastatin (LIPITOR) 80 MG tablet Take 0.5 tablets (40 mg total) by mouth at bedtime. 45 tablet 1  . clopidogrel (PLAVIX) 75 MG tablet Take 1 tablet (75 mg total) by mouth daily with breakfast. 30 tablet 1  . glucose blood (BAYER CONTOUR TEST) test strip TEST ONCE DAILY FOR DIABETES Dx Code: E11.9 100 each 11  . glyBURIDE (DIABETA) 5 MG tablet TAKE 1/2 (ONE-HALF) TABLET BY MOUTH TWICE DAILY WITH MEALS 90 tablet 0  . losartan (COZAAR) 100 MG tablet TAKE 1/2 TABLET BY MOUTH 2 TIMES A DAY 90 tablet 3  . metFORMIN (GLUCOPHAGE) 500 MG tablet TAKE 2 TABLETS BY MOUTH EVERY MORNING THEN TAKE 1 TABLET BY MOUTH EVERY EVENING 270 tablet 1  . spironolactone (ALDACTONE) 25 MG tablet Take 0.5 tablets (12.5 mg total) by mouth daily. 45 tablet 1  . amLODipine (NORVASC) 10 MG tablet TAKE 1/2 TABLET BY MOUTH DAILY 45 tablet 1   No facility-administered medications prior to visit.      Allergies:   Patient has no known allergies.   Social History   Socioeconomic History  . Marital status: Married    Spouse name: Not on file  . Number of children: 2  . Years of education: Not on file  . Highest  education level: Not on file  Occupational History  . Not on file  Social Needs  . Financial resource strain: Not on file  . Food insecurity:    Worry: Not on file    Inability: Not on file  . Transportation needs:    Medical: Not on file    Non-medical: Not on file  Tobacco Use  . Smoking status: Former Smoker    Packs/day: 1.00    Years: 3.00    Pack years: 3.00  . Smokeless tobacco: Never Used  Substance and Sexual Activity  . Alcohol use: No    Alcohol/week: 0.0 oz  . Drug use: No  . Sexual activity: Not on file  Lifestyle  . Physical activity:    Days per  week: Not on file    Minutes per session: Not on file  . Stress: Not on file  Relationships  . Social connections:    Talks on phone: Not on file    Gets together: Not on file    Attends religious service: Not on file    Active member of club or organization: Not on file    Attends meetings of clubs or organizations: Not on file    Relationship status: Not on file  Other Topics Concern  . Not on file  Social History Narrative  . Not on file     Family History:  The patient's family history includes Diabetes in his brother, maternal uncle, and sister; Diabetes (age of onset: 27) in his mother; Heart disease in his brother, maternal uncle, and sister; Heart disease (age of onset: 62) in his father; Hyperlipidemia in his son.      ROS:   Please see the history of present illness.    ROS All other systems reviewed and are negative.   PHYSICAL EXAM:   VS:  BP 118/74   Pulse 77   Ht 5\' 8"  (1.727 m)   Wt 175 lb (79.4 kg)   SpO2 98%   BMI 26.61 kg/m    GEN: Well nourished, well developed, in no acute distress  HEENT: normal  Neck: no JVD, carotid bruits, or masses Cardiac: RRR; no murmurs, rubs, or gallops,no edema  Respiratory:  clear to auscultation bilaterally, normal work of breathing GI: soft, nontender, nondistended, + BS MS: no deformity or atrophy  Skin: warm and dry, no rash. Groin sites stable.    Neuro:  Alert and Oriented x 3, Strength and sensation are intact Psych: euthymic mood, full affect   Wt Readings from Last 3 Encounters:  04/16/18 175 lb (79.4 kg)  04/02/18 173 lb 9.6 oz (78.7 kg)  03/26/18 174 lb (78.9 kg)      Studies/Labs Reviewed:   EKG:  EKG is ordered today.  The ekg ordered today demonstrates atrial fibrillation HR 77.   Recent Labs: 03/26/2018: B Natriuretic Peptide 101.4 03/28/2018: ALT 21 04/02/2018: BUN 16; Creatinine, Ser 1.22; Hemoglobin 12.1; Magnesium 1.9; Platelets 124; Potassium 4.1; Sodium 137   Lipid Panel    Component Value Date/Time   CHOL 123 09/23/2017 1100   TRIG 171.0 (H) 09/23/2017 1100   HDL 33.10 (L) 09/23/2017 1100   CHOLHDL 4 09/23/2017 1100   VLDL 34.2 09/23/2017 1100   LDLCALC 56 09/23/2017 1100    Additional studies/ records that were reviewed today include:  TAVR OPERATIVE NOTE  Date of Procedure:                04/01/2018  Preoperative Diagnosis:      Severe Aortic Stenosis   Postoperative Diagnosis:    Same   Procedure:        Transcatheter Aortic Valve Replacement - Percutaneous Percutaneous Right Transfemoral Approach             Edwards Sapien 3 THV (size 26 mm, model # 9600TFX, serial # [id number])              Co-Surgeons:                        Lauree Chandler, MD and Valentina Gu. Roxy Manns, MD   Pre-operative Echo Findings: ? Severe aortic stenosis ? Normal left ventricular systolic function  Post-operative Echo Findings: ? No paravalvular leak ? Normal left ventricular systolic function  ____________________   Post operative echo 04/02/18 Study Conclusions - Left ventricle: The cavity size was normal. Systolic function was   normal. The estimated ejection fraction was in the range of 60%   to 65%. Wall motion was normal; there were no regional wall   motion abnormalities. Features are consistent with a pseudonormal   left ventricular filling pattern, with concomitant abnormal    relaxation and increased filling pressure (grade 2 diastolic   dysfunction). Doppler parameters are consistent with high   ventricular filling pressure. - Aortic valve: S/P Edwards Sapiein 11mm TAVR with well seated   valve and normal function. There is mild perivalvular AR. Mean   gradient (S): 13 mm Hg. Valve area (VTI): 2.62 cm^2. Valve area   (Vmax): 2.63 cm^2. Valve area (Vmean): 2.76 cm^2. - Mitral valve: There was trivial regurgitation. Valve area by   pressure half-time: 2.44 cm^2. - Left atrium: The atrium was mildly dilated. - Tricuspid valve: There was mild regurgitation. - Pulmonary arteries: PA peak pressure: 35 mm Hg (S). Impressions: - Compared to prior echo a TAVR is now present.    ASSESSMENT & PLAN:   Severe AS s/p TAVR: doing well. Overall improved since surgery. He has had some days where he felt wiped out and SOB, but this was after working outside in the heat. He also complains of a persistent cough. Groin sites are healing well. SBE prophylaxis discussed, we have Rx'd Amoxil. I will see him back for 1 month echo and follow up  Cough: this has bothered him a lot and has been going on since last fall, but worsened recently. Productive of clear sputum. Will get CXR to rule out PNA. If normal, we can trial a PPI.   HTN: BP well controlled today  CAD s/p CABG: continue medical therapy  HLD: continue statin   DMT2: continue current therapy.   Medication Adjustments/Labs and Tests Ordered: Current medicines are reviewed at length with the patient today.  Concerns regarding medicines are outlined above.  Medication changes, Labs and Tests ordered today are listed in the Patient Instructions below. Patient Instructions  Medication Instructions:  Your provider recommends that you continue on your current medications as directed. Please refer to the Current Medication list given to you today.    Your physician discussed the importance of taking an antibiotic prior to  any dental, gastrointestinal, genitourinary procedures to prevent damage to the heart valves from infection. You were given a prescription for AMOXIL 2 g to take 1 hour prior to dental visits.   Labwork: None  Testing/Procedures: Nell Range recommends you have a CHEST XRAY. Please proceed to [place] at [facility] to complete. You do not need an appointment. The address is [place] (at [address] corner of [address]  Please keep your appointment for your echocardiogram scheduled 6/20. Please arrive to our office at 12:30 PM for your appointment.  Follow-Up: You have a follow-up appointment arranged with Nell Range, PA immediately following your echocardiogram on 6/20.   Any Other Special Instructions Will Be Listed Below (If Applicable).     If you need a refill on your cardiac medications before your next appointment, please call your pharmacy.      Signed, Angelena Form, PA-C  04/16/2018 2:21 PM    [facility] [address], [place], [place]  [address] Phone: [phone]; Fax: [phone]

## 2018-04-16 ENCOUNTER — Ambulatory Visit
Admission: RE | Admit: 2018-04-16 | Discharge: 2018-04-16 | Disposition: A | Discharge status: Home / Self Care | Payer: Medicare Other | Source: Ambulatory Visit | Attending: Physician Assistant | Admitting: Physician Assistant

## 2018-04-16 ENCOUNTER — Encounter: Payer: Self-pay | Admitting: Physician Assistant

## 2018-04-16 ENCOUNTER — Other Ambulatory Visit: Payer: Self-pay | Admitting: Family Medicine

## 2018-04-16 ENCOUNTER — Ambulatory Visit (INDEPENDENT_AMBULATORY_CARE_PROVIDER_SITE_OTHER): Payer: Medicare Other | Admitting: Physician Assistant

## 2018-04-16 ENCOUNTER — Telehealth: Payer: Self-pay

## 2018-04-16 VITALS — BP 118/74 | HR 77 | Ht 68.0 in | Wt 175.0 lb

## 2018-04-16 DIAGNOSIS — R05 Cough: Secondary | ICD-10-CM

## 2018-04-16 DIAGNOSIS — E118 Type 2 diabetes mellitus with unspecified complications: Secondary | ICD-10-CM

## 2018-04-16 DIAGNOSIS — I2510 Atherosclerotic heart disease of native coronary artery without angina pectoris: Secondary | ICD-10-CM | POA: Diagnosis not present

## 2018-04-16 DIAGNOSIS — R059 Cough, unspecified: Secondary | ICD-10-CM

## 2018-04-16 DIAGNOSIS — E785 Hyperlipidemia, unspecified: Secondary | ICD-10-CM | POA: Diagnosis not present

## 2018-04-16 DIAGNOSIS — Z952 Presence of prosthetic heart valve: Secondary | ICD-10-CM | POA: Diagnosis not present

## 2018-04-16 DIAGNOSIS — I10 Essential (primary) hypertension: Secondary | ICD-10-CM | POA: Diagnosis not present

## 2018-04-16 MED ORDER — AMOXICILLIN 500 MG PO TABS: 2000.0000 mg | ORAL_TABLET | ORAL | 6 refills | Status: DC

## 2018-04-16 MED ORDER — PANTOPRAZOLE SODIUM 40 MG PO TBEC: 40.0000 mg | DELAYED_RELEASE_TABLET | Freq: Every day | ORAL | 11 refills | Status: DC

## 2018-04-16 MED FILL — PANTOPRAZOLE SOD DR 40 MG T: 40 | 30 days supply | Qty: 30 | Fill #0

## 2018-04-16 MED FILL — AMLODIPINE BESYLATE 10 MG T: 10 | 90 days supply | Qty: 45 | Fill #0

## 2018-04-16 NOTE — Patient Instructions (Addendum)
Medication Instructions:  Your provider recommends that you continue on your current medications as directed. Please refer to the Current Medication list given to you today.    Your physician discussed the importance of taking an antibiotic prior to any dental, gastrointestinal, genitourinary procedures to prevent damage to the heart valves from infection. You were given a prescription for AMOXIL 2 g to take 1 hour prior to dental visits.   Labwork: None  Testing/Procedures: Nell Range recommends you have a CHEST XRAY. Please proceed to [place] at [facility] to complete. You do not need an appointment. The address is [place] (at the corner of [address]  Please keep your appointment for your echocardiogram scheduled 6/20. Please arrive to our office at 12:30 PM for your appointment.  Follow-Up: You have a follow-up appointment arranged with Nell Range, PA immediately following your echocardiogram on 6/20.   Any Other Special Instructions Will Be Listed Below (If Applicable).     If you need a refill on your cardiac medications before your next appointment, please call your pharmacy.

## 2018-04-16 NOTE — Telephone Encounter (Signed)
Informed patient of results and verbal understanding expressed.  Instructed patient to START PROTONIX 40 daily. He will give an update about his symptoms at next visit 6/20. Patient agrees with treatment plan.

## 2018-04-16 NOTE — Telephone Encounter (Signed)
-----   Message from Eileen Stanford, PA-C sent at 04/16/2018  5:30 PM EDT ----- It reports smoking related changes with no acute PNA or CHF. 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 Lets trial a PPI for chronic cough and see how he does. Rx Protonix 40mg  daily. If he doesn't improve we could think about a pulm referral.

## 2018-04-23 ENCOUNTER — Encounter: Payer: Self-pay | Admitting: Thoracic Surgery (Cardiothoracic Vascular Surgery)

## 2018-05-01 MED FILL — AMOXICILLIN 500 MG CAPSULE: 500 | 2 days supply | Qty: 8 | Fill #0

## 2018-05-01 MED FILL — CLOPIDOGREL 75 MG TABLET: 75 | 30 days supply | Qty: 30 | Fill #1

## 2018-05-05 ENCOUNTER — Other Ambulatory Visit: Payer: Self-pay | Admitting: Family Medicine

## 2018-05-06 MED FILL — metFORMIN HCL 500 MG TABS: 500 | 90 days supply | Qty: 270 | Fill #0

## 2018-05-07 MED FILL — PREVIDENT 5000 BOOSTER PLUS: 1.1 | 30 days supply | Qty: 100 | Fill #0

## 2018-05-08 ENCOUNTER — Other Ambulatory Visit: Payer: Self-pay

## 2018-05-08 ENCOUNTER — Ambulatory Visit (INDEPENDENT_AMBULATORY_CARE_PROVIDER_SITE_OTHER): Payer: Medicare Other | Admitting: Physician Assistant

## 2018-05-08 ENCOUNTER — Encounter: Payer: Self-pay | Admitting: Physician Assistant

## 2018-05-08 ENCOUNTER — Ambulatory Visit (HOSPITAL_COMMUNITY): Payer: Medicare Other | Attending: Cardiology

## 2018-05-08 VITALS — BP 146/78 | HR 64 | Ht 68.0 in | Wt 172.0 lb

## 2018-05-08 DIAGNOSIS — I350 Nonrheumatic aortic (valve) stenosis: Secondary | ICD-10-CM | POA: Diagnosis not present

## 2018-05-08 DIAGNOSIS — Z952 Presence of prosthetic heart valve: Secondary | ICD-10-CM | POA: Diagnosis not present

## 2018-05-08 DIAGNOSIS — E118 Type 2 diabetes mellitus with unspecified complications: Secondary | ICD-10-CM

## 2018-05-08 DIAGNOSIS — I10 Essential (primary) hypertension: Secondary | ICD-10-CM | POA: Diagnosis not present

## 2018-05-08 DIAGNOSIS — R05 Cough: Secondary | ICD-10-CM

## 2018-05-08 DIAGNOSIS — E785 Hyperlipidemia, unspecified: Secondary | ICD-10-CM

## 2018-05-08 DIAGNOSIS — I2510 Atherosclerotic heart disease of native coronary artery without angina pectoris: Secondary | ICD-10-CM | POA: Diagnosis not present

## 2018-05-08 DIAGNOSIS — E119 Type 2 diabetes mellitus without complications: Secondary | ICD-10-CM | POA: Diagnosis not present

## 2018-05-08 DIAGNOSIS — R059 Cough, unspecified: Secondary | ICD-10-CM

## 2018-05-08 DIAGNOSIS — I2583 Coronary atherosclerosis due to lipid rich plaque: Secondary | ICD-10-CM

## 2018-05-08 NOTE — Progress Notes (Signed)
[facility] [place]                                       Cardiology Office Note    Date:  05/08/2018   ID:  Peter Scott, DOB 1932/03/13, MRN [id number]  PCP:  [name], MD  Cardiologist:  Dr. [name] / Dr. Angelena Form & Dr. Roxy Manns ( TAVR )    CC: 1 month s/p TAVR  History of Present Illness:  Peter Scott is a 84 y.o. male with a history of  CAD s/p CABG (2001), SVT s/p ablation, HTN, HLD, DMT2, and severe AS s/p TAVR ( 04/01/18) who presents to clinic for follow up.   He developed a heart murmur and was diagnosed with aortic stenosis which had slowly progressed in severity on follow-up echocardiograms. He had a gradual worsening of fatigue and decreased exercise tolerance with exertional SOB. Recent follow-up echocardiogram revealed preserved left ventricular systolic function with progression and severity of aortic stenosis. Peak velocity across the aortic valve measured 3.6 m/s corresponding to mean transvalvular gradient estimated 29 mmHg. The DVI was 0.24. Left ventricular ejection fraction remain normal and was estimated 55-60%. The patient subsequently underwent TEE to confirm the presence of severe aortic stenosis. Left ventricular systolic function was felt to be low normal with ejection fraction estimated 50-55%. By planimetry the aortic valve area was 0.85 cm. The dimensionless index was reported 0.26. There was mild aortic insufficiency and mild mitral regurgitation. The patient was referred to the multidisciplinary heart valve clinic and underwent diagnostic cardiac catheterization by Dr. Angelena Form.Catheterization revealed severe native three-vessel coronary artery disease with continued patency of previous bypass grafts.  He underwent successful TAVR with a 26 mm Edwards Sapien THV via the TF approach on 04/01/18. Post operative echo showed EF 60%, normally functioning TAVR valve with no PVL; mean gradient 13 mmHg. He was  discharged on ASA and plavix.   At post hospital follow up appointment he complained of a persistent cough. Repeat CXR showed mild chronic bronchitic-smoking related changes with no pneumonia nor CHF. He was started on a PPI; however, he didn't start it because the cough got better. He thinks this is related to allergies.   Today he presents to clinic for follow up. No CP. He gets SOB with bending over but otherwise he has no issues. He can use his push mower with no dyspnea. No LE edema, orthopnea or PND. No dizziness or syncope. No blood in stool or urine. He occasionally gets palpitations when laying on his left that resolves after coughing.     Past Medical History:  Diagnosis Date  . Aortic aneurysm ([place])   . Aortic stenosis   . CAD (coronary artery disease)   . Diabetes mellitus type II, controlled ([place])   . Heart disease    Ablation  . History of chicken pox   . Hyperlipidemia   . Hypertension   . S/P CABG x 4    2001 - [place]  . 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 Umbilical hernia     Past Surgical History:  Procedure Laterality Date  . ABLATION     Rhythm problem; rhythm unknown; [place] GRAFT  2001  . EXPLORATION POST  OPERATIVE OPEN HEART  2001, June  . INTRAOPERATIVE TRANSTHORACIC ECHOCARDIOGRAM N/A 04/01/2018   Procedure: INTRAOPERATIVE TRANSTHORACIC ECHOCARDIOGRAM;  Surgeon: Burnell Blanks, MD;  Location: [place];  Service: Open Heart Surgery;  Laterality: N/A;  . RIGHT/LEFT HEART CATH AND CORONARY/GRAFT ANGIOGRAPHY N/A 01/31/2018   Procedure: RIGHT/LEFT HEART CATH AND CORONARY/GRAFT ANGIOGRAPHY;  Surgeon: Burnell Blanks, MD;  Location: [place] CV LAB;  Service: Cardiovascular;  Laterality: N/A;  . TEE WITHOUT CARDIOVERSION N/A 01/17/2018   Procedure: TRANSESOPHAGEAL ECHOCARDIOGRAM (TEE);   Surgeon: Lelon Perla, MD;  [facility] ENDOSCOPY;  Service: Cardiovascular;  Laterality: N/A;  . TONSILLECTOMY    . TRANSCATHETER AORTIC VALVE REPLACEMENT, TRANSFEMORAL N/A 04/01/2018   Procedure: TRANSCATHETER AORTIC VALVE REPLACEMENT, TRANSFEMORAL;  Surgeon: Burnell Blanks, MD;  Location: [place];  Service: Open Heart Surgery;  Laterality: N/A;  . UMBILICAL HERNIA REPAIR  2015    Current Medications: Outpatient Medications Prior to Visit  Medication Sig Dispense Refill  . acetaminophen (TYLENOL) 500 MG tablet Take 1 tablet (500 mg total) by mouth every 6 (six) hours as needed. 30 tablet 0  . amLODipine (NORVASC) 10 MG tablet TAKE 1/2 TABLET BY MOUTH DAILY 45 tablet 1  . amoxicillin (AMOXIL) 500 MG tablet Take 4 tablets (2,000 mg total) by mouth as directed. Take 1 hour prior to dental visits. 8 tablet 6  . aspirin EC 81 MG tablet Take 81 mg by mouth daily.    Marland Kitchen atorvastatin (LIPITOR) 80 MG tablet Take 0.5 tablets (40 mg total) by mouth at bedtime. 45 tablet 1  . clopidogrel (PLAVIX) 75 MG tablet Take 1 tablet (75 mg total) by mouth daily with breakfast. 30 tablet 1  . glucose blood (BAYER CONTOUR TEST) test strip TEST ONCE DAILY FOR DIABETES Dx Code: E11.9 100 each 11  . glyBURIDE (DIABETA) 5 MG tablet TAKE 1/2 (ONE-HALF) TABLET BY MOUTH TWICE DAILY WITH MEALS 90 tablet 0  . losartan (COZAAR) 100 MG tablet TAKE 1/2 TABLET BY MOUTH 2 TIMES A DAY 90 tablet 3  . metFORMIN (GLUCOPHAGE) 500 MG tablet TAKE 2 TABLETS BY MOUTH EVERY MORNING THEN TAKE 1 TABLET BY MOUTH EVERY EVENING 270 tablet 1  . Sodium Fluoride (PREVIDENT 5000 BOOSTER PLUS) 1.1 % PSTE Place onto teeth as directed.    Marland Kitchen spironolactone (ALDACTONE) 25 MG tablet Take 0.5 tablets (12.5 mg total) by mouth daily. 45 tablet 1  . pantoprazole (PROTONIX) 40 MG tablet Take 1 tablet (40 mg total) by mouth daily. (Patient not taking: Reported on 05/08/2018) 30 tablet 11   No facility-administered medications prior to visit.        Allergies:   Patient has no known allergies.   Social History   Socioeconomic History  . Marital status: Married    Spouse name: Not on file  . Number of children: 2  . Years of education: Not on file  . Highest education level: Not on file  Occupational History  . Not on file  Social Needs  . Financial resource strain: Not on file  . Food insecurity:    Worry: Not on file    Inability: Not on file  . Transportation needs:    Medical: Not on file    Non-medical: Not on file  Tobacco Use  . Smoking status: Former Smoker    Packs/day: 1.00    Years: 3.00    Pack years: 3.00  . Smokeless tobacco: Never Used  Substance and Sexual Activity  . Alcohol use: No  Alcohol/week: 0.0 oz  . Drug use: No  . Sexual activity: Not on file  Lifestyle  . Physical activity:    Days per week: Not on file    Minutes per session: Not on file  . Stress: Not on file  Relationships  . Social connections:    Talks on phone: Not on file    Gets together: Not on file    Attends religious service: Not on file    Active member of club or organization: Not on file    Attends meetings of clubs or organizations: Not on file    Relationship status: Not on file  Other Topics Concern  . Not on file  Social History Narrative  . Not on file     Family History:  The patient's family history includes Diabetes in his brother, maternal uncle, and sister; Diabetes (age of onset: 21) in his mother; Heart disease in his brother, maternal uncle, and sister; Heart disease (age of onset: 68) in his father; Hyperlipidemia in his son.     ROS:   Please see the history of present illness.    ROS All other systems reviewed and are negative.   PHYSICAL EXAM:   VS:  BP (!) 146/78   Pulse 64   Ht 5\' 8"  (1.727 m)   Wt 172 lb (78 kg)   SpO2 97%   BMI 26.15 kg/m    GEN: Well nourished, well developed, in no acute distress  HEENT: normal  Neck: no JVD, carotid bruits, or masses Cardiac: RRR; no  murmurs, rubs, or gallops,no edema  Respiratory:  clear to auscultation bilaterally, normal work of breathing GI: soft, nontender, nondistended, + BS MS: no deformity or atrophy  Skin: warm and dry, no rash Neuro:  Alert and Oriented x 3, Strength and sensation are intact Psych: euthymic mood, full affect   Wt Readings from Last 3 Encounters:  05/08/18 172 lb (78 kg)  04/16/18 175 lb (79.4 kg)  04/02/18 173 lb 9.6 oz (78.7 kg)      Studies/Labs Reviewed:   EKG:  EKG is NOT ordered today.   Recent Labs: 03/26/2018: B Natriuretic Peptide 101.4 03/28/2018: ALT 21 04/02/2018: BUN 16; Creatinine, Ser 1.22; Hemoglobin 12.1; Magnesium 1.9; Platelets 124; Potassium 4.1; Sodium 137   Lipid Panel    Component Value Date/Time   CHOL 123 09/23/2017 1100   TRIG 171.0 (H) 09/23/2017 1100   HDL 33.10 (L) 09/23/2017 1100   CHOLHDL 4 09/23/2017 1100   VLDL 34.2 09/23/2017 1100   LDLCALC 56 09/23/2017 1100    Additional studies/ records that were reviewed today include:  TAVR OPERATIVE NOTE  Date of Procedure:04/01/2018  Preoperative Diagnosis:Severe Aortic Stenosis   Postoperative Diagnosis:Same   Procedure:   Transcatheter Aortic Valve Replacement - PercutaneousPercutaneous RightTransfemoral Approach Edwards Sapien 3 THV (size 84mm, model # 9600TFX, serial # [id number])  Co-Surgeons:Christopher Angelena Form, MD andClarence H. Roxy Manns, MD   Pre-operative Echo Findings: ? Severe aortic stenosis ? Normalleft ventricular systolic function  Post-operative Echo Findings: ? Noparavalvular leak ? Normalleft ventricular systolic function   ____________________   Post operative echo 04/02/18 Study Conclusions - Left ventricle: The cavity size was normal. Systolic function was normal. The estimated ejection fraction was in the range of 60% to 65%. Wall motion was normal; there were no regional  wall motion abnormalities. Features are consistent with a pseudonormal left ventricular filling pattern, with concomitant abnormal relaxation and increased filling pressure (grade 2 diastolic dysfunction). Doppler parameters are consistent with high  ventricular filling pressure. - Aortic valve: S/P Edwards Sapiein 61mm TAVR with well seated valve and normal function. There is mild perivalvular AR. Mean gradient (S): 13 mm Hg. Valve area (VTI): 2.62 cm^2. Valve area (Vmax): 2.63 cm^2. Valve area (Vmean): 2.76 cm^2. - Mitral valve: There was trivial regurgitation. Valve area by pressure half-time: 2.44 cm^2. - Left atrium: The atrium was mildly dilated. - Tricuspid valve: There was mild regurgitation. - Pulmonary arteries: PA peak pressure: 35 mm Hg (S). Impressions: - Compared to prior echo a TAVR is now present.  ____________________  2D ECHO 05/08/18 (30 days post op) Study Conclusions - Left ventricle: The cavity size was normal. Systolic function was   mildly reduced. The estimated ejection fraction was in the range   of 45% to 50%. Diffuse hypokinesis. Features are consistent with   a pseudonormal left ventricular filling pattern, with concomitant   abnormal relaxation and increased filling pressure (grade 2   diastolic dysfunction). - Aortic valve: A TAVR 42mm Edwards Sapien bioprosthesis was   present. There was trivial perivalvular regurgitation, improved.   Peak velocity (S): 208 cm/s. Mean gradient (S): 10 mm Hg. - Mitral valve: Calcified annulus. There was mild regurgitation. - Left atrium: The atrium was severely dilated. Volume/bsa, ES,   (1-plane Simpson&'s, A2C): 54.4 ml/m^2. - Tricuspid valve: There was moderate regurgitation directed   eccentrically. - Pulmonary arteries: Systolic pressure was mildly increased. PA   peak pressure: 35 mm Hg (S).   ASSESSMENT & PLAN:   Severe AS s/p TAVR: 2D ECHO today shows EF mildly decreased (45-50%) with  normally functioning TAVR valve; no PVL and mean gradient 10 mm Hg. He has NYHA class I symptoms. SBE prophylaxis discussed; he has Amoxil. He can stop plavix after 6 months of therapy and continue ASA 81 mg daily. I will see him back 1 year with an echo.   LV dysfunction: noted on echo today. He will continue on Losartan and spiro. He appears euvolemic. Continue to follow   Cough: this has resolved.   HTN: Bp mildly elevated today but it has been running normal at home. No changes made  CAD s/p CABG: continue medical therapy.   HLD: continue statin  DMT2: continue current regimen   Palpitations: he has a history of SVT s/p ablation. He has some intermittent palpitations mostly at night. I offered a monitor but he deferred this at this time.    Medication Adjustments/Labs and Tests Ordered: Current medicines are reviewed at length with the patient today.  Concerns regarding medicines are outlined above.  Medication changes, Labs and Tests ordered today are listed in the Patient Instructions below. Patient Instructions  Medication Instructions:  Your physician recommends that you continue on your current medications as directed. Please refer to the Current Medication list given to you today.  ---You may stop taking your clopidogrel (plavix) on October 03, 2018---  Labwork: None ordered  Testing/Procedures: None ordered  Follow-Up: Your physician wants you to follow-up in: 1 year with the TAVR Clinic with an echocardiogram. We will call you to schedule these appointments.   Keep your appointment already scheduled with Dr. [name] on 07/02/18 at 9:20 AM.  Any Other Special Instructions Will Be Listed Below (If Applicable).     If you need a refill on your cardiac medications before your next appointment, please call your pharmacy.      Signed, Angelena Form, PA-C  05/08/2018 3:51 PM    [facility] [place],  [place], [place]   [address] Phone: [phone]; Fax: [phone]

## 2018-05-08 NOTE — Patient Instructions (Addendum)
Medication Instructions:  Your physician recommends that you continue on your current medications as directed. Please refer to the Current Medication list given to you today.  ---You may stop taking your clopidogrel (plavix) on October 03, 2018---  Labwork: None ordered  Testing/Procedures: None ordered  Follow-Up: Your physician wants you to follow-up in: 1 year with the TAVR Clinic with an echocardiogram. We will call you to schedule these appointments.   Keep your appointment already scheduled with Dr. [name] on 07/02/18 at 9:20 AM.  Any Other Special Instructions Will Be Listed Below (If Applicable).     If you need a refill on your cardiac medications before your next appointment, please call your pharmacy.

## 2018-05-23 ENCOUNTER — Other Ambulatory Visit: Payer: Self-pay | Admitting: Family Medicine

## 2018-05-23 MED FILL — SPIRONOLACTONE 25 MG TABLET: 25 | 90 days supply | Qty: 45 | Fill #0

## 2018-05-26 ENCOUNTER — Other Ambulatory Visit: Payer: Self-pay

## 2018-05-26 ENCOUNTER — Encounter: Payer: Self-pay | Admitting: Physician Assistant

## 2018-05-26 MED ORDER — CLOPIDOGREL BISULFATE 75 MG PO TABS: 75.0000 mg | ORAL_TABLET | Freq: Every day | ORAL | 4 refills | Status: DC

## 2018-05-28 MED FILL — CLOPIDOGREL 75 MG TABLET: 75 | 30 days supply | Qty: 30 | Fill #0

## 2018-06-23 ENCOUNTER — Other Ambulatory Visit: Payer: Self-pay | Admitting: Family Medicine

## 2018-06-23 MED FILL — LOSARTAN POTASSIUM 100 MG T: 100 | 90 days supply | Qty: 90 | Fill #0

## 2018-06-27 MED FILL — CLOPIDOGREL 75 MG TABLET: 75 | 30 days supply | Qty: 30 | Fill #1

## 2018-06-27 MED FILL — glyBURIDE 5 MG TABS: 5 | 90 days supply | Qty: 90 | Fill #1

## 2018-06-30 ENCOUNTER — Telehealth: Payer: Self-pay

## 2018-06-30 NOTE — Telephone Encounter (Signed)
PCP has no availability. Please offer patient an appointment at a different day this week.

## 2018-06-30 NOTE — H&P (View-Only) (Signed)
HPI: FU CAD and AS. Patient is status post coronary artery bypass graft in [place] in 2001. He also had ablation of what sounds to be SVT at that time. Carotid dopplers 11/15 showed plaque but no significant stenosis. Abdominal ultrasound January 2019 showed abdominal aortic aneurysm measuring 3.9 cm.  Recently found to have moderate to severe aortic stenosis.  Carotid Dopplers March 2019 showed 1 to 39% bilateral stenosis.  Patient had preoperative cardiac catheterization that revealed severe native three-vessel coronary artery disease with patency of grafts.  He underwent TAVR 5/19.  Last echocardiogram June 2019 showed ejection fraction 45 to 50%, moderate diastolic dysfunction, prior aortic valve replacement with mean gradient 10 mmHg, severe left atrial enlargement and moderate tricuspid regurgitation.  Since last seen, patient describes fatigue, some dyspnea and palpitations/irregular heart rhythm.  No chest pain or syncope.  Current Outpatient Medications  Medication Sig Dispense Refill  . acetaminophen (TYLENOL) 500 MG tablet Take 1 tablet (500 mg total) by mouth every 6 (six) hours as needed. 30 tablet 0  . amLODipine (NORVASC) 10 MG tablet TAKE 1/2 TABLET BY MOUTH DAILY 45 tablet 1  . amoxicillin (AMOXIL) 500 MG tablet Take 4 tablets (2,000 mg total) by mouth as directed. Take 1 hour prior to dental visits. 8 tablet 6  . aspirin EC 81 MG tablet Take 81 mg by mouth daily.    Marland Kitchen atorvastatin (LIPITOR) 80 MG tablet Take 0.5 tablets (40 mg total) by mouth at bedtime. 45 tablet 1  . atorvastatin (LIPITOR) 80 MG tablet TAKE 1/2 TABLET BY MOUTH AT BEDTIME 45 tablet 3  . clopidogrel (PLAVIX) 75 MG tablet Take 1 tablet (75 mg total) by mouth daily with breakfast. 30 tablet 4  . glucose blood (BAYER CONTOUR TEST) test strip TEST ONCE DAILY FOR DIABETES Dx Code: E11.9 100 each 11  . glyBURIDE (DIABETA) 5 MG tablet TAKE 1/2 (ONE-HALF) TABLET BY MOUTH TWICE DAILY WITH MEALS 90 tablet 0  .  losartan (COZAAR) 100 MG tablet TAKE 1/2 TABLET BY MOUTH 2 TIMES A DAY 90 tablet 1  . metFORMIN (GLUCOPHAGE) 500 MG tablet TAKE 2 TABLETS BY MOUTH EVERY MORNING THEN TAKE 1 TABLET BY MOUTH EVERY EVENING 270 tablet 1  . Sodium Fluoride (PREVIDENT 5000 BOOSTER PLUS) 1.1 % PSTE Place onto teeth as directed.    Marland Kitchen spironolactone (ALDACTONE) 25 MG tablet TAKE 0.5 TABLETS (12.5 MG TOTAL) BY MOUTH DAILY. 45 tablet 1   No current facility-administered medications for this visit.      Past Medical History:  Diagnosis Date  . Aortic aneurysm ([place])   . Aortic stenosis   . CAD (coronary artery disease)   . Diabetes mellitus type II, controlled ([place])   . Heart disease    Ablation  . History of chicken pox   . Hyperlipidemia   . Hypertension   . S/P CABG x 4    2001 - [place]  . 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 Umbilical hernia     Past Surgical History:  Procedure Laterality Date  . ABLATION     Rhythm problem; rhythm unknown; [place] GRAFT  2001  . EXPLORATION POST OPERATIVE OPEN HEART  2001, June  . INTRAOPERATIVE TRANSTHORACIC ECHOCARDIOGRAM N/A 04/01/2018   Procedure: INTRAOPERATIVE TRANSTHORACIC ECHOCARDIOGRAM;  Surgeon: Burnell Blanks, MD;  Location: [place];  Service: Open Heart  Surgery;  Laterality: N/A;  . RIGHT/LEFT HEART CATH AND CORONARY/GRAFT ANGIOGRAPHY N/A 01/31/2018   Procedure: RIGHT/LEFT HEART CATH AND CORONARY/GRAFT ANGIOGRAPHY;  Surgeon: Burnell Blanks, MD;  Location: [place] CV LAB;  Service: Cardiovascular;  Laterality: N/A;  . TEE WITHOUT CARDIOVERSION N/A 01/17/2018   Procedure: TRANSESOPHAGEAL ECHOCARDIOGRAM (TEE);  Surgeon: Lelon Perla, MD;  Location: [facility] ENDOSCOPY;  Service: Cardiovascular;  Laterality: N/A;  . TONSILLECTOMY    . TRANSCATHETER AORTIC VALVE REPLACEMENT,  TRANSFEMORAL N/A 04/01/2018   Procedure: TRANSCATHETER AORTIC VALVE REPLACEMENT, TRANSFEMORAL;  Surgeon: Burnell Blanks, MD;  Location: [place];  Service: Open Heart Surgery;  Laterality: N/A;  . UMBILICAL HERNIA REPAIR  2015    Social History   Socioeconomic History  . Marital status: Married    Spouse name: Not on file  . Number of children: 2  . Years of education: Not on file  . Highest education level: Not on file  Occupational History  . Not on file  Social Needs  . Financial resource strain: Not on file  . Food insecurity:    Worry: Not on file    Inability: Not on file  . Transportation needs:    Medical: Not on file    Non-medical: Not on file  Tobacco Use  . Smoking status: Former Smoker    Packs/day: 1.00    Years: 3.00    Pack years: 3.00  . Smokeless tobacco: Never Used  Substance and Sexual Activity  . Alcohol use: No    Alcohol/week: 0.0 standard drinks  . Drug use: No  . Sexual activity: Not on file  Lifestyle  . Physical activity:    Days per week: Not on file    Minutes per session: Not on file  . Stress: Not on file  Relationships  . Social connections:    Talks on phone: Not on file    Gets together: Not on file    Attends religious service: Not on file    Active member of club or organization: Not on file    Attends meetings of clubs or organizations: Not on file    Relationship status: Not on file  . Intimate partner violence:    Fear of current or ex partner: Not on file    Emotionally abused: Not on file    Physically abused: Not on file    Forced sexual activity: Not on file  Other Topics Concern  . Not on file  Social History Narrative  . Not on file    Family History  Problem Relation Age of Onset  . Diabetes Mother 47       Deceased  . Heart disease Father 37       Deceased  . Heart disease Maternal Uncle   . Diabetes Maternal Uncle   . Heart disease Brother   . Diabetes Brother   . Diabetes Sister        #1  .  Heart disease Sister        #2  . Hyperlipidemia Son        x2    ROS: Fatigue but no fevers or chills, productive cough, hemoptysis, dysphasia, odynophagia, melena, hematochezia, dysuria, hematuria, rash, seizure activity, orthopnea, PND, pedal edema, claudication. Remaining systems are negative.  Physical Exam: Well-developed well-nourished in no acute distress.  Skin is warm and dry.  HEENT is normal.  Neck is supple.  Chest is clear to auscultation with normal expansion.  Cardiovascular exam is irregular.  1/6 systolic  murmur.  No diastolic murmur. Abdominal exam nontender or distended. No masses palpated. Extremities show no edema. neuro grossly intact  Electrocardiogram-atrial fibrillation at a rate of 82.  Nonspecific ST changes.  Personally reviewed.   A/P  1 status post aortic valve replacement-given new onset atrial fibrillation and need for apixaban I will discontinue Plavix.  Continue aspirin 81 mg daily.  2 coronary artery disease status post coronary artery bypass and graft-recent catheterization revealed patent grafts.  Continue medical therapy with aspirin and statin.  3 abdominal aortic aneurysm-ultrasound January 2020.  4 hypertension-blood pressure is controlled.  Continue present medications.  5 hyperlipidemia-continue statin.  6 new onset atrial fibrillation-patient is in atrial fibrillation this morning.  He is describing increased fatigue and dyspnea at night.  This is likely related to his atrial arrhythmia.  His heart rate appears to be controlled. CHADSvasc 4.  Discontinue Plavix.  Add apixaban 5 mg twice daily.  We will plan to schedule for TEE guided cardioversion next week as he is symptomatic.  Hopefully he will hold sinus rhythm which should improve his symptoms.  May need to add antiarrhythmic if he does not hold sinus rhythm.  Check hemoglobin and renal function in 4 weeks.  Check TSH.  Kirk Ruths, MD

## 2018-06-30 NOTE — Telephone Encounter (Signed)
Copied from [place] [id number]. Topic: Appointment Scheduling - Scheduling Inquiry for Clinic >> Jun 30, 2018  7:51 AM Lennox Solders wrote: Reason for CRM: pt is calling and has cough and chest congestion. Pt would like to be work in today. There is not avail opening as of now

## 2018-06-30 NOTE — Telephone Encounter (Signed)
Pt has been sch to see edward on 07-02-18

## 2018-06-30 NOTE — Progress Notes (Signed)
HPI: FU CAD and AS. Patient is status post coronary artery bypass graft in [place] in 2001. He also had ablation of what sounds to be SVT at that time. Carotid dopplers 11/15 showed plaque but no significant stenosis. Abdominal ultrasound January 2019 showed abdominal aortic aneurysm measuring 3.9 cm.  Recently found to have moderate to severe aortic stenosis.  Carotid Dopplers March 2019 showed 1 to 39% bilateral stenosis.  Patient had preoperative cardiac catheterization that revealed severe native three-vessel coronary artery disease with patency of grafts.  He underwent TAVR 5/19.  Last echocardiogram June 2019 showed ejection fraction 45 to 50%, moderate diastolic dysfunction, prior aortic valve replacement with mean gradient 10 mmHg, severe left atrial enlargement and moderate tricuspid regurgitation.  Since last seen, patient describes fatigue, some dyspnea and palpitations/irregular heart rhythm.  No chest pain or syncope.  Current Outpatient Medications  Medication Sig Dispense Refill  . acetaminophen (TYLENOL) 500 MG tablet Take 1 tablet (500 mg total) by mouth every 6 (six) hours as needed. 30 tablet 0  . amLODipine (NORVASC) 10 MG tablet TAKE 1/2 TABLET BY MOUTH DAILY 45 tablet 1  . amoxicillin (AMOXIL) 500 MG tablet Take 4 tablets (2,000 mg total) by mouth as directed. Take 1 hour prior to dental visits. 8 tablet 6  . aspirin EC 81 MG tablet Take 81 mg by mouth daily.    Marland Kitchen atorvastatin (LIPITOR) 80 MG tablet Take 0.5 tablets (40 mg total) by mouth at bedtime. 45 tablet 1  . atorvastatin (LIPITOR) 80 MG tablet TAKE 1/2 TABLET BY MOUTH AT BEDTIME 45 tablet 3  . clopidogrel (PLAVIX) 75 MG tablet Take 1 tablet (75 mg total) by mouth daily with breakfast. 30 tablet 4  . glucose blood (BAYER CONTOUR TEST) test strip TEST ONCE DAILY FOR DIABETES Dx Code: E11.9 100 each 11  . glyBURIDE (DIABETA) 5 MG tablet TAKE 1/2 (ONE-HALF) TABLET BY MOUTH TWICE DAILY WITH MEALS 90 tablet 0  .  losartan (COZAAR) 100 MG tablet TAKE 1/2 TABLET BY MOUTH 2 TIMES A DAY 90 tablet 1  . metFORMIN (GLUCOPHAGE) 500 MG tablet TAKE 2 TABLETS BY MOUTH EVERY MORNING THEN TAKE 1 TABLET BY MOUTH EVERY EVENING 270 tablet 1  . Sodium Fluoride (PREVIDENT 5000 BOOSTER PLUS) 1.1 % PSTE Place onto teeth as directed.    Marland Kitchen spironolactone (ALDACTONE) 25 MG tablet TAKE 0.5 TABLETS (12.5 MG TOTAL) BY MOUTH DAILY. 45 tablet 1   No current facility-administered medications for this visit.      Past Medical History:  Diagnosis Date  . Aortic aneurysm ([place])   . Aortic stenosis   . CAD (coronary artery disease)   . Diabetes mellitus type II, controlled ([place])   . Heart disease    Ablation  . History of chicken pox   . Hyperlipidemia   . Hypertension   . S/P CABG x 4    2001 - [place]  . 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 Umbilical hernia     Past Surgical History:  Procedure Laterality Date  . ABLATION     Rhythm problem; rhythm unknown; [place] GRAFT  2001  . EXPLORATION POST OPERATIVE OPEN HEART  2001, June  . INTRAOPERATIVE TRANSTHORACIC ECHOCARDIOGRAM N/A 04/01/2018   Procedure: INTRAOPERATIVE TRANSTHORACIC ECHOCARDIOGRAM;  Surgeon: Burnell Blanks, MD;  Location: [place];  Service: Open Heart  Surgery;  Laterality: N/A;  . RIGHT/LEFT HEART CATH AND CORONARY/GRAFT ANGIOGRAPHY N/A 01/31/2018   Procedure: RIGHT/LEFT HEART CATH AND CORONARY/GRAFT ANGIOGRAPHY;  Surgeon: Burnell Blanks, MD;  Location: [place] [facility] LAB;  Service: Cardiovascular;  Laterality: N/A;  . TEE WITHOUT CARDIOVERSION N/A 01/17/2018   Procedure: TRANSESOPHAGEAL ECHOCARDIOGRAM (TEE);  Surgeon: Lelon Perla, MD;  Location: [facility] ENDOSCOPY;  Service: Cardiovascular;  Laterality: N/A;  . TONSILLECTOMY    . TRANSCATHETER AORTIC VALVE REPLACEMENT,  TRANSFEMORAL N/A 04/01/2018   Procedure: TRANSCATHETER AORTIC VALVE REPLACEMENT, TRANSFEMORAL;  Surgeon: Burnell Blanks, MD;  Location: [place];  Service: Open Heart Surgery;  Laterality: N/A;  . UMBILICAL HERNIA REPAIR  2015    Social History   Socioeconomic History  . Marital status: Married    Spouse name: Not on file  . Number of children: 2  . Years of education: Not on file  . Highest education level: Not on file  Occupational History  . Not on file  Social Needs  . Financial resource strain: Not on file  . Food insecurity:    Worry: Not on file    Inability: Not on file  . Transportation needs:    Medical: Not on file    Non-medical: Not on file  Tobacco Use  . Smoking status: Former Smoker    Packs/day: 1.00    Years: 3.00    Pack years: 3.00  . Smokeless tobacco: Never Used  Substance and Sexual Activity  . Alcohol use: No    Alcohol/week: 0.0 standard drinks  . Drug use: No  . Sexual activity: Not on file  Lifestyle  . Physical activity:    Days per week: Not on file    Minutes per session: Not on file  . Stress: Not on file  Relationships  . Social connections:    Talks on phone: Not on file    Gets together: Not on file    Attends religious service: Not on file    Active member of club or organization: Not on file    Attends meetings of clubs or organizations: Not on file    Relationship status: Not on file  . Intimate partner violence:    Fear of current or ex partner: Not on file    Emotionally abused: Not on file    Physically abused: Not on file    Forced sexual activity: Not on file  Other Topics Concern  . Not on file  Social History Narrative  . Not on file    Family History  Problem Relation Age of Onset  . Diabetes Mother 15       Deceased  . Heart disease Father 59       Deceased  . Heart disease Maternal Uncle   . Diabetes Maternal Uncle   . Heart disease Brother   . Diabetes Brother   . Diabetes Sister        #1  .  Heart disease Sister        #2  . Hyperlipidemia Son        x2    ROS: Fatigue but no fevers or chills, productive cough, hemoptysis, dysphasia, odynophagia, melena, hematochezia, dysuria, hematuria, rash, seizure activity, orthopnea, PND, pedal edema, claudication. Remaining systems are negative.  Physical Exam: Well-developed well-nourished in no acute distress.  Skin is warm and dry.  HEENT is normal.  Neck is supple.  Chest is clear to auscultation with normal expansion.  Cardiovascular exam is irregular.  1/6 systolic  murmur.  No diastolic murmur. Abdominal exam nontender or distended. No masses palpated. Extremities show no edema. neuro grossly intact  Electrocardiogram-atrial fibrillation at a rate of 82.  Nonspecific ST changes.  Personally reviewed.   A/P  1 status post aortic valve replacement-given new onset atrial fibrillation and need for apixaban I will discontinue Plavix.  Continue aspirin 81 mg daily.  2 coronary artery disease status post coronary artery bypass and graft-recent catheterization revealed patent grafts.  Continue medical therapy with aspirin and statin.  3 abdominal aortic aneurysm-ultrasound January 2020.  4 hypertension-blood pressure is controlled.  Continue present medications.  5 hyperlipidemia-continue statin.  6 new onset atrial fibrillation-patient is in atrial fibrillation this morning.  He is describing increased fatigue and dyspnea at night.  This is likely related to his atrial arrhythmia.  His heart rate appears to be controlled. CHADSvasc 4.  Discontinue Plavix.  Add apixaban 5 mg twice daily.  We will plan to schedule for TEE guided cardioversion next week as he is symptomatic.  Hopefully he will hold sinus rhythm which should improve his symptoms.  May need to add antiarrhythmic if he does not hold sinus rhythm.  Check hemoglobin and renal function in 4 weeks.  Check TSH.  Kirk Ruths, MD

## 2018-07-02 ENCOUNTER — Other Ambulatory Visit: Payer: Self-pay | Admitting: Family Medicine

## 2018-07-02 ENCOUNTER — Encounter: Payer: Self-pay | Admitting: Medical

## 2018-07-02 ENCOUNTER — Ambulatory Visit (INDEPENDENT_AMBULATORY_CARE_PROVIDER_SITE_OTHER): Payer: Medicare Other | Admitting: Cardiology

## 2018-07-02 ENCOUNTER — Other Ambulatory Visit: Payer: Self-pay | Admitting: *Deleted

## 2018-07-02 ENCOUNTER — Encounter: Payer: Self-pay | Admitting: Cardiology

## 2018-07-02 ENCOUNTER — Ambulatory Visit (INDEPENDENT_AMBULATORY_CARE_PROVIDER_SITE_OTHER): Payer: Medicare Other | Admitting: Medical

## 2018-07-02 ENCOUNTER — Ambulatory Visit: Payer: Medicare Other | Admitting: Medical

## 2018-07-02 VITALS — BP 150/70 | HR 98 | Temp 98.1°F | Resp 16 | Ht 68.0 in | Wt 170.0 lb

## 2018-07-02 VITALS — BP 138/70 | HR 89 | Ht 68.0 in | Wt 170.0 lb

## 2018-07-02 DIAGNOSIS — R05 Cough: Secondary | ICD-10-CM | POA: Diagnosis not present

## 2018-07-02 DIAGNOSIS — I480 Paroxysmal atrial fibrillation: Secondary | ICD-10-CM | POA: Diagnosis not present

## 2018-07-02 DIAGNOSIS — I2583 Coronary atherosclerosis due to lipid rich plaque: Secondary | ICD-10-CM | POA: Diagnosis not present

## 2018-07-02 DIAGNOSIS — R059 Cough, unspecified: Secondary | ICD-10-CM

## 2018-07-02 DIAGNOSIS — Z952 Presence of prosthetic heart valve: Secondary | ICD-10-CM | POA: Diagnosis not present

## 2018-07-02 DIAGNOSIS — I2510 Atherosclerotic heart disease of native coronary artery without angina pectoris: Secondary | ICD-10-CM

## 2018-07-02 DIAGNOSIS — J40 Bronchitis, not specified as acute or chronic: Secondary | ICD-10-CM | POA: Diagnosis not present

## 2018-07-02 MED ORDER — APIXABAN 5 MG PO TABS: 5.0000 mg | ORAL_TABLET | Freq: Two times a day (BID) | ORAL | 6 refills | Status: DC

## 2018-07-02 MED ORDER — DOXYCYCLINE HYCLATE 100 MG PO TABS: 100.0000 mg | ORAL_TABLET | Freq: Two times a day (BID) | ORAL | 0 refills | Status: DC

## 2018-07-02 MED ORDER — HYDROCODONE-HOMATROPINE 5-1.5 MG/5ML PO SYRP: 5.0000 mL | ORAL_SOLUTION | Freq: Three times a day (TID) | ORAL | 0 refills | Status: DC | PRN

## 2018-07-02 MED FILL — DOXYCYCLINE HYCLATE 100 MG: 100 | 10 days supply | Qty: 20 | Fill #0

## 2018-07-02 MED FILL — ATORVASTATIN 80 MG TABLET: 80 | 90 days supply | Qty: 45 | Fill #0

## 2018-07-02 MED FILL — HYDROCODONE-HOMATROPINE SOL: 5-1.5 | 7 days supply | Qty: 100 | Fill #0

## 2018-07-02 MED FILL — ELIQUIS 5 MG TABLET: 5 | 30 days supply | Qty: 60 | Fill #0

## 2018-07-02 NOTE — Progress Notes (Signed)
Subjective:    Patient ID: Peter Scott, male    DOB: Nov 07, 1932, 84 y.o.   MRN: [id number]  HPI  Pt in with some severe cough for about 3 weeks. Cough chronically per pt even before past 3 weeks but worse recently. Pt states coughing up some mucus. States almost can fill a cup coughing so much. He is not sleeping well due to cough.  No chills, no sweats, or fever. Feels some chest congestion. No chest pain.    Review of Systems  Constitutional: Negative for chills, fatigue and fever.  Respiratory: Positive for cough. Negative for chest tightness and wheezing.        Chest congestion  Cardiovascular: Negative for chest pain and leg swelling.  Gastrointestinal: Negative for abdominal pain, diarrhea, nausea and vomiting.  Musculoskeletal: Negative for arthralgias, back pain and neck pain.  Neurological: Negative for dizziness, syncope, facial asymmetry, weakness and numbness.  Hematological: Negative for adenopathy. Does not bruise/bleed easily.  Psychiatric/Behavioral: Negative for behavioral problems and confusion.     Past Medical History:  Diagnosis Date  . Aortic aneurysm ([place])   . Aortic stenosis   . CAD (coronary artery disease)   . Diabetes mellitus type II, controlled ([place])   . Heart disease    Ablation  . History of chicken pox   . Hyperlipidemia   . Hypertension   . S/P CABG x 4    2001 - [place]  . 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 Umbilical hernia      Social History   Socioeconomic History  . Marital status: Married    Spouse name: Not on file  . Number of children: 2  . Years of education: Not on file  . Highest education level: Not on file  Occupational History  . Not on file  Social Needs  . Financial resource strain: Not on file  . Food insecurity:    Worry: Not on file    Inability: Not on file  .  Transportation needs:    Medical: Not on file    Non-medical: Not on file  Tobacco Use  . Smoking status: Former Smoker    Packs/day: 1.00    Years: 3.00    Pack years: 3.00  . Smokeless tobacco: Never Used  Substance and Sexual Activity  . Alcohol use: No    Alcohol/week: 0.0 standard drinks  . Drug use: No  . Sexual activity: Not on file  Lifestyle  . Physical activity:    Days per week: Not on file    Minutes per session: Not on file  . Stress: Not on file  Relationships  . Social connections:    Talks on phone: Not on file    Gets together: Not on file    Attends religious service: Not on file    Active member of club or organization: Not on file    Attends meetings of clubs or organizations: Not on file    Relationship status: Not on file  . Intimate partner violence:    Fear of current or ex partner: Not on file    Emotionally abused: Not on file    Physically abused: Not on file    Forced sexual activity: Not on file  Other Topics Concern  . Not on file  Social History Narrative  . Not on file    Past Surgical History:  Procedure Laterality Date  . ABLATION     Rhythm problem; rhythm unknown; [place] GRAFT  2001  . EXPLORATION POST OPERATIVE OPEN HEART  2001, June  . INTRAOPERATIVE TRANSTHORACIC ECHOCARDIOGRAM N/A 04/01/2018   Procedure: INTRAOPERATIVE TRANSTHORACIC ECHOCARDIOGRAM;  Surgeon: Burnell Blanks, MD;  Location: [place];  Service: Open Heart Surgery;  Laterality: N/A;  . RIGHT/LEFT HEART CATH AND CORONARY/GRAFT ANGIOGRAPHY N/A 01/31/2018   Procedure: RIGHT/LEFT HEART CATH AND CORONARY/GRAFT ANGIOGRAPHY;  Surgeon: Burnell Blanks, MD;  Location: [place] CV LAB;  Service: Cardiovascular;  Laterality: N/A;  . TEE WITHOUT CARDIOVERSION N/A 01/17/2018   Procedure: TRANSESOPHAGEAL ECHOCARDIOGRAM (TEE);  Surgeon: Lelon Perla, MD;  Location: [facility] ENDOSCOPY;  Service: Cardiovascular;  Laterality: N/A;  .  TONSILLECTOMY    . TRANSCATHETER AORTIC VALVE REPLACEMENT, TRANSFEMORAL N/A 04/01/2018   Procedure: TRANSCATHETER AORTIC VALVE REPLACEMENT, TRANSFEMORAL;  Surgeon: Burnell Blanks, MD;  Location: [place];  Service: Open Heart Surgery;  Laterality: N/A;  . UMBILICAL HERNIA REPAIR  2015    Family History  Problem Relation Age of Onset  . Diabetes Mother 63       Deceased  . Heart disease Father 26       Deceased  . Heart disease Maternal Uncle   . Diabetes Maternal Uncle   . Heart disease Brother   . Diabetes Brother   . Diabetes Sister        #1  . Heart disease Sister        #2  . Hyperlipidemia Son        x2    No Known Allergies  Current Outpatient Medications on File Prior to Visit  Medication Sig Dispense Refill  . acetaminophen (TYLENOL) 500 MG tablet Take 1 tablet (500 mg total) by mouth every 6 (six) hours as needed. (Patient taking differently: Take 500 mg by mouth every 6 (six) hours as needed for moderate pain or headache. ) 30 tablet 0  . amLODipine (NORVASC) 10 MG tablet TAKE 1/2 TABLET BY MOUTH DAILY (Patient taking differently: Take 5 mg by mouth daily. ) 45 tablet 1  . amoxicillin (AMOXIL) 500 MG tablet Take 4 tablets (2,000 mg total) by mouth as directed. Take 1 hour prior to dental visits. (Patient taking differently: Take 2,000 mg by mouth See admin instructions. Take 2000 mg by mouth 1 hour prior to dental visits.) 8 tablet 6  . apixaban (ELIQUIS) 5 MG TABS tablet Take 1 tablet (5 mg total) by mouth 2 (two) times daily. 60 tablet 6  . aspirin EC 81 MG tablet Take 81 mg by mouth daily.    Marland Kitchen atorvastatin (LIPITOR) 80 MG tablet TAKE 1/2 TABLET BY MOUTH AT BEDTIME (Patient taking differently: Take 40 mg by mouth at bedtime. ) 45 tablet 3  . glucose blood (BAYER CONTOUR TEST) test strip TEST ONCE DAILY FOR DIABETES Dx Code: E11.9 100 each 11  . glyBURIDE (DIABETA) 5 MG tablet TAKE 1/2 (ONE-HALF) TABLET BY MOUTH TWICE DAILY WITH MEALS (Patient taking differently:  Take 2.5 mg by mouth 2 (two) times daily with a meal. ) 90 tablet 0  . losartan (COZAAR) 100 MG tablet TAKE 1/2 TABLET BY MOUTH 2 TIMES A DAY (Patient taking differently: Take 50 mg by mouth 2 (two) times daily. ) 90 tablet 1  . metFORMIN (GLUCOPHAGE) 500 MG tablet TAKE 2 TABLETS BY MOUTH EVERY MORNING THEN TAKE 1 TABLET BY MOUTH EVERY EVENING (Patient taking differently: Take 500-1,000 mg by mouth  See admin instructions. Take 1000 mg by mouth in the morning and take 500 mg by mouth in the evening) 270 tablet 1  . Sodium Fluoride (PREVIDENT 5000 BOOSTER PLUS) 1.1 % PSTE Place 1 application onto teeth daily.     Marland Kitchen spironolactone (ALDACTONE) 25 MG tablet TAKE 0.5 TABLETS (12.5 MG TOTAL) BY MOUTH DAILY. 45 tablet 1   No current facility-administered medications on file prior to visit.     BP (!) 150/70   Pulse 98 Comment: on recheck  Temp 98.1 F (36.7 C) (Oral)   Resp 16   Ht 5\' 8"  (1.727 m)   Wt 170 lb (77.1 kg)   SpO2 96%   BMI 25.85 kg/m       Objective:   Physical Exam   General  Mental Status - Alert. General Appearance - Well groomed. Not in acute distress.  Skin Rashes- No Rashes.  HEENT Head- Normal. Ear Auditory Canal - Left- Normal. Right - Normal.Tympanic Membrane- Left- Normal. Right- Normal. Eye Sclera/Conjunctiva- Left- Normal. Right- Normal. Nose & Sinuses Nasal Mucosa- Left-   Not boggy or Congested. Right-  Not boggy or Congested.Bilateral no maxillary and no  frontal sinus pressure. Mouth & Throat Lips: Upper Lip- Normal: no dryness, cracking, pallor, cyanosis, or vesicular eruption. Lower Lip-Normal: no dryness, cracking, pallor, cyanosis or vesicular eruption. Buccal Mucosa- Bilateral- No Aphthous ulcers. Oropharynx- No Discharge or Erythema. Tonsils: Characteristics- Bilateral- No Erythema or Congestion. Size/Enlargement- Bilateral- No enlargement. Discharge- bilateral-None.  Neck Neck- Supple. No Masses.   Chest and Lung Exam Auscultation: Breath  Sounds:-even and unlabored. But shallow  Cardiovascular Auscultation:Rythm- Regular, rate and rhythm. Murmurs & Other Heart Sounds:Ausculatation of the heart reveal- No Murmurs.  Lymphatic Head & Neck General Head & Neck Lymphatics: Bilateral: Description- No Localized lymphadenopathy.       Assessment & Plan:  You appear to have bronchitis. Rest hydrate and tylenol for fever. I am prescribing cough medicine hycodan, and doxycycline antibiotic.(rx advisement given)  You should gradually get better. Please get cxr today.  Follow up in 7-10 days or as needed  [employer], [employer]

## 2018-07-02 NOTE — Patient Instructions (Signed)
Medication Instructions:   STOP PLAVIX  START ELIQUIS 5 MG ONE TABLET TWICE DAILY  Labwork:  Your physician recommends that you return for lab work WHEN YOU RETURN IN 4 WEEKS  Testing/Procedures:  Your physician has requested that you have a TEE/Cardioversion. During a TEE, sound waves are used to create images of your heart. It provides your doctor with information about the size and shape of your heart and how well your heart's chambers and valves are working. In this test, a transducer is attached to the end of a flexible tube that is guided down you throat and into your esophagus (the tube leading from your mouth to your stomach) to get a more detailed image of your heart. Once the TEE has determined that a blood clot is not present, the cardioversion begins. Electrical Cardioversion uses a jolt of electricity to your heart either through paddles or wired patches attached to your chest. This is a controlled, usually prescheduled, procedure. This procedure is done at the hospital and you are not awake during the procedure. You usually go home the day of the procedure. Please see the instruction sheet given to you today for more information.    Follow-Up:  Your physician recommends that you schedule a follow-up appointment in:  [place] Monday 07-07-18 @   [place] Monday 06-1918 BETWEEN 8:30 AM AND 9 AM. YOUR PROCEDURE WILL BE AT 10 AM WITH DR Meda Coffee.  NOTHING TO EAT OR DRINK AFTER MIDNIGHT Sunday EVENING  Monday MORNING TAKE YOUR ELIQUIS AND ALL OTHER MEDICATIONS WITH ENOUGH WATER TO GET IT DOWN  YOU WILL NEED SOMEONE TO DRIVE YOU HOME AFTER THE PROCEDURE

## 2018-07-02 NOTE — Patient Instructions (Addendum)
You appear to have bronchitis. Rest hydrate and tylenol for fever. I am prescribing cough medicine hycodan, and doxycycline antibiotic.(rx advisement given)  You should gradually get better. Please get cxr today.  Follow up in 7-10 days or as needed

## 2018-07-03 ENCOUNTER — Telehealth: Payer: Self-pay | Admitting: Cardiology

## 2018-07-03 ENCOUNTER — Encounter: Payer: Self-pay | Admitting: Thoracic Surgery (Cardiothoracic Vascular Surgery)

## 2018-07-03 ENCOUNTER — Encounter: Payer: Self-pay | Admitting: Medical

## 2018-07-03 ENCOUNTER — Ambulatory Visit (HOSPITAL_BASED_OUTPATIENT_CLINIC_OR_DEPARTMENT_OTHER)
Admission: RE | Admit: 2018-07-03 | Discharge: 2018-07-03 | Disposition: A | Discharge status: Home / Self Care | Payer: Medicare Other | Source: Ambulatory Visit | Attending: Medical | Admitting: Medical

## 2018-07-03 DIAGNOSIS — R059 Cough, unspecified: Secondary | ICD-10-CM

## 2018-07-03 DIAGNOSIS — R05 Cough: Secondary | ICD-10-CM | POA: Diagnosis not present

## 2018-07-03 DIAGNOSIS — J40 Bronchitis, not specified as acute or chronic: Secondary | ICD-10-CM

## 2018-07-03 DIAGNOSIS — R918 Other nonspecific abnormal finding of lung field: Secondary | ICD-10-CM | POA: Insufficient documentation

## 2018-07-03 NOTE — Telephone Encounter (Signed)
Agree with antibiotics for one week and then rescheduling TEE for that time (one week after original date). Kirk Ruths

## 2018-07-03 NOTE — Telephone Encounter (Signed)
New Message    Patient is calling to discuss his upcoming procedure. Please call to discuss.

## 2018-07-03 NOTE — Telephone Encounter (Signed)
If he is going to be started on antibiotics I would postpone TEE/DCCV for a week

## 2018-07-03 NOTE — Telephone Encounter (Signed)
Patient called and stated that he had a bad cough, so he went to his PCP and they ordered a chest xray, which is in the chart. Patient has possible pneumonia in his right upper long, and they advised to inform Cardiologist.   I will send a message to [facility] but he is out this week. And we would like to know if patient would be okay to continue with his TEE with you on Monday.   Please advise, thank you!

## 2018-07-04 NOTE — Telephone Encounter (Signed)
Called pt and let him know he is rescheduled for 07/16/18 @ 9am.

## 2018-07-04 NOTE — Telephone Encounter (Signed)
Called patient and advised of message. He began his antibiotic yesterday with a 10 day supply. Patient was rescheduled for his TEE to be completed on 08/26 at 9:00am. Patient verbalized understanding.

## 2018-07-09 ENCOUNTER — Ambulatory Visit: Payer: Medicare Other | Admitting: Medical

## 2018-07-09 DIAGNOSIS — Z86008 Personal history of in-situ neoplasm of other site: Secondary | ICD-10-CM | POA: Diagnosis not present

## 2018-07-09 DIAGNOSIS — L570 Actinic keratosis: Secondary | ICD-10-CM | POA: Diagnosis not present

## 2018-07-14 ENCOUNTER — Ambulatory Visit (HOSPITAL_COMMUNITY): Payer: Medicare Other | Admitting: Certified Registered Nurse Anesthetist

## 2018-07-14 ENCOUNTER — Ambulatory Visit (HOSPITAL_BASED_OUTPATIENT_CLINIC_OR_DEPARTMENT_OTHER)
Admission: RE | Admit: 2018-07-14 | Discharge: 2018-07-14 | Disposition: A | Discharge status: Home / Self Care | Payer: Medicare Other | Source: Ambulatory Visit | Attending: Cardiology | Admitting: Cardiology

## 2018-07-14 ENCOUNTER — Other Ambulatory Visit: Payer: Self-pay

## 2018-07-14 ENCOUNTER — Encounter (HOSPITAL_COMMUNITY)
Admission: RE | Disposition: A | Discharge status: Home / Self Care | Payer: Self-pay | Source: Ambulatory Visit | Attending: Cardiology

## 2018-07-14 ENCOUNTER — Ambulatory Visit (HOSPITAL_COMMUNITY)
Admission: RE | Admit: 2018-07-14 | Discharge: 2018-07-14 | Disposition: A | Discharge status: Home / Self Care | Payer: Medicare Other | Source: Ambulatory Visit | Attending: Cardiology | Admitting: Cardiology

## 2018-07-14 ENCOUNTER — Encounter (HOSPITAL_COMMUNITY): Payer: Self-pay | Admitting: *Deleted

## 2018-07-14 DIAGNOSIS — I714 Abdominal aortic aneurysm, without rupture: Secondary | ICD-10-CM | POA: Insufficient documentation

## 2018-07-14 DIAGNOSIS — Z8249 Family history of ischemic heart disease and other diseases of the circulatory system: Secondary | ICD-10-CM | POA: Diagnosis not present

## 2018-07-14 DIAGNOSIS — Z7902 Long term (current) use of antithrombotics/antiplatelets: Secondary | ICD-10-CM | POA: Insufficient documentation

## 2018-07-14 DIAGNOSIS — E119 Type 2 diabetes mellitus without complications: Secondary | ICD-10-CM | POA: Insufficient documentation

## 2018-07-14 DIAGNOSIS — Z7984 Long term (current) use of oral hypoglycemic drugs: Secondary | ICD-10-CM | POA: Diagnosis not present

## 2018-07-14 DIAGNOSIS — Z833 Family history of diabetes mellitus: Secondary | ICD-10-CM | POA: Insufficient documentation

## 2018-07-14 DIAGNOSIS — Z79899 Other long term (current) drug therapy: Secondary | ICD-10-CM | POA: Insufficient documentation

## 2018-07-14 DIAGNOSIS — I340 Nonrheumatic mitral (valve) insufficiency: Secondary | ICD-10-CM | POA: Diagnosis not present

## 2018-07-14 DIAGNOSIS — Z8619 Personal history of other infectious and parasitic diseases: Secondary | ICD-10-CM | POA: Diagnosis not present

## 2018-07-14 DIAGNOSIS — I2510 Atherosclerotic heart disease of native coronary artery without angina pectoris: Secondary | ICD-10-CM | POA: Diagnosis not present

## 2018-07-14 DIAGNOSIS — E785 Hyperlipidemia, unspecified: Secondary | ICD-10-CM | POA: Diagnosis not present

## 2018-07-14 DIAGNOSIS — Z951 Presence of aortocoronary bypass graft: Secondary | ICD-10-CM | POA: Diagnosis not present

## 2018-07-14 DIAGNOSIS — Z7982 Long term (current) use of aspirin: Secondary | ICD-10-CM | POA: Diagnosis not present

## 2018-07-14 DIAGNOSIS — Z9889 Other specified postprocedural states: Secondary | ICD-10-CM | POA: Insufficient documentation

## 2018-07-14 DIAGNOSIS — I480 Paroxysmal atrial fibrillation: Secondary | ICD-10-CM

## 2018-07-14 DIAGNOSIS — I420 Dilated cardiomyopathy: Secondary | ICD-10-CM | POA: Diagnosis not present

## 2018-07-14 DIAGNOSIS — I4891 Unspecified atrial fibrillation: Secondary | ICD-10-CM | POA: Insufficient documentation

## 2018-07-14 DIAGNOSIS — Z87891 Personal history of nicotine dependence: Secondary | ICD-10-CM | POA: Diagnosis not present

## 2018-07-14 DIAGNOSIS — I051 Rheumatic mitral insufficiency: Secondary | ICD-10-CM | POA: Insufficient documentation

## 2018-07-14 DIAGNOSIS — I471 Supraventricular tachycardia: Secondary | ICD-10-CM | POA: Diagnosis not present

## 2018-07-14 DIAGNOSIS — I10 Essential (primary) hypertension: Secondary | ICD-10-CM | POA: Diagnosis not present

## 2018-07-14 DIAGNOSIS — Z952 Presence of prosthetic heart valve: Secondary | ICD-10-CM | POA: Diagnosis not present

## 2018-07-14 DIAGNOSIS — I350 Nonrheumatic aortic (valve) stenosis: Secondary | ICD-10-CM | POA: Diagnosis not present

## 2018-07-14 DIAGNOSIS — I25810 Atherosclerosis of coronary artery bypass graft(s) without angina pectoris: Secondary | ICD-10-CM | POA: Diagnosis not present

## 2018-07-14 HISTORY — PX: CARDIOVERSION: SHX1299

## 2018-07-14 HISTORY — PX: TEE WITHOUT CARDIOVERSION: SHX5443

## 2018-07-14 LAB — POCT I-STAT 4, (NA,K, GLUC, HGB,HCT)
Glucose, Bld: 125 mg/dL — ABNORMAL HIGH (ref 70–99)
HCT: 39 % (ref 39.0–52.0)
Hemoglobin: 13.3 g/dL (ref 13.0–17.0)
Potassium: 4.4 mmol/L (ref 3.5–5.1)
Sodium: 137 mmol/L (ref 135–145)

## 2018-07-14 SURGERY — ECHOCARDIOGRAM, TRANSESOPHAGEAL
Anesthesia: General

## 2018-07-14 MED ORDER — LIDOCAINE 2% (20 MG/ML) 5 ML SYRINGE
INTRAMUSCULAR | Status: DC | PRN
  Administered 2018-07-14: 60 mg via INTRAVENOUS

## 2018-07-14 MED ORDER — SODIUM CHLORIDE 0.9 % IV SOLN
INTRAVENOUS | Status: DC
  Administered 2018-07-14: 500 mL via INTRAVENOUS

## 2018-07-14 MED ORDER — SODIUM CHLORIDE 0.9 % IV SOLN: 250.0000 mL | INTRAVENOUS | Status: DC

## 2018-07-14 MED ORDER — PROPOFOL 10 MG/ML IV BOLUS
INTRAVENOUS | Status: DC | PRN
  Administered 2018-07-14: 20 mg via INTRAVENOUS
  Administered 2018-07-14: 10 mg via INTRAVENOUS

## 2018-07-14 MED ORDER — SODIUM CHLORIDE 0.9% FLUSH: 3.0000 mL | Freq: Two times a day (BID) | INTRAVENOUS | Status: DC

## 2018-07-14 MED ORDER — SODIUM CHLORIDE 0.9% FLUSH: 3.0000 mL | INTRAVENOUS | Status: DC | PRN

## 2018-07-14 MED ORDER — PROPOFOL 500 MG/50ML IV EMUL
INTRAVENOUS | Status: DC | PRN
  Administered 2018-07-14: 100 ug/kg/min via INTRAVENOUS

## 2018-07-14 MED ORDER — BUTAMBEN-TETRACAINE-BENZOCAINE 2-2-14 % EX AERO
INHALATION_SPRAY | CUTANEOUS | Status: DC | PRN
  Administered 2018-07-14: 2 via TOPICAL

## 2018-07-14 NOTE — Progress Notes (Signed)
  Echocardiogram Echocardiogram Transesophageal has been performed.  Peter Scott 07/14/2018, 9:17 AM

## 2018-07-14 NOTE — Discharge Instructions (Signed)
TEE ° °YOU HAD AN CARDIAC PROCEDURE TODAY: Refer to the procedure report and other information in the discharge instructions given to you for any specific questions about what was found during the examination. If this information does not answer your questions, please call [facility] office at [phone] to clarify.  ° °DIET: Your first meal following the procedure should be a light meal and then it is ok to progress to your normal diet. A half-sandwich or bowl of soup is an example of a good first meal. Heavy or fried foods are harder to digest and may make you feel nauseous or bloated. Drink plenty of fluids but you should avoid alcoholic beverages for 24 hours. If you had a esophageal dilation, please see attached instructions for diet.  ° °ACTIVITY: Your care partner should take you home directly after the procedure. You should plan to take it easy, moving slowly for the rest of the day. You can resume normal activity the day after the procedure however YOU SHOULD NOT DRIVE, use power tools, machinery or perform tasks that involve climbing or major physical exertion for 24 hours (because of the sedation medicines used during the test).  ° °SYMPTOMS TO REPORT IMMEDIATELY: °A cardiologist can be reached at any hour. Please call [phone] for any of the following symptoms:  °Vomiting of blood or coffee ground material  °New, significant abdominal pain  °New, significant chest pain or pain under the shoulder blades  °Painful or persistently difficult swallowing  °New shortness of breath  °Black, tarry-looking or red, bloody stools ° °FOLLOW UP:  °Please also call with any specific questions about appointments or follow up tests. ° °Electrical Cardioversion, Care After °This sheet gives you information about how to care for yourself after your procedure. Your health care provider may also give you more specific instructions. If you have problems or questions, contact your health care provider. °What can I  expect after the procedure? °After the procedure, it is common to have: °· Some redness on the skin where the shocks were given. ° °Follow these instructions at home: °· Do not drive for 24 hours if you were given a medicine to help you relax (sedative). °· Take over-the-counter and prescription medicines only as told by your health care provider. °· Ask your health care provider how to check your pulse. Check it often. °· Rest for 48 hours after the procedure or as told by your health care provider. °· Avoid or limit your caffeine use as told by your health care provider. °Contact a health care provider if: °· You feel like your heart is beating too quickly or your pulse is not regular. °· You have a serious muscle cramp that does not go away. °Get help right away if: °· You have discomfort in your chest. °· You are dizzy or you feel faint. °· You have trouble breathing or you are short of breath. °· Your speech is slurred. °· You have trouble moving an arm or leg on one side of your body. °· Your fingers or toes turn cold or blue. °This information is not intended to replace advice given to you by your health care provider. Make sure you discuss any questions you have with your health care provider. °Document Released: 08/26/2013 Document Revised: 06/08/2016 Document Reviewed: 05/11/2016 °Elsevier Interactive Patient Education © 2018 [employer] ° °

## 2018-07-14 NOTE — Transfer of Care (Signed)
Immediate Anesthesia Transfer of Care Note  Patient: Peter Scott  Procedure(s) Performed: TRANSESOPHAGEAL ECHOCARDIOGRAM (TEE) (N/A ) CARDIOVERSION (N/A )  Patient Location: PACU and Endoscopy Unit  Anesthesia Type:MAC  Level of Consciousness: drowsy and patient cooperative  Airway & Oxygen Therapy: Patient Spontanous Breathing and Patient connected to nasal cannula oxygen  Post-op Assessment: Report given to RN and Post -op Vital signs reviewed and stable  Post vital signs: Reviewed and stable  Last Vitals:  Vitals Value Taken Time  BP    Temp    Pulse    Resp    SpO2      Last Pain:  Vitals:   07/14/18 0743  TempSrc: Oral  PainSc: 0-No pain         Complications: No apparent anesthesia complications

## 2018-07-14 NOTE — Anesthesia Preprocedure Evaluation (Signed)
Anesthesia Evaluation  Patient identified by MRN, date of birth, ID band Patient awake    Reviewed: Allergy & Precautions, NPO status , Patient's Chart, lab work & pertinent test results  History of Anesthesia Complications Negative for: history of anesthetic complications  Airway Mallampati: III  TM Distance: >3 FB Neck ROM: Full    Dental  (+) Teeth Intact, Dental Advisory Given   Pulmonary former smoker,    breath sounds clear to auscultation       Cardiovascular hypertension, Pt. on medications + CAD, + CABG (x 4 in 2011) and + Peripheral Vascular Disease  + Valvular Problems/Murmurs AS  Rhythm:Irregular Rate:Normal + Systolic murmurs ECG: SR, PAC's, rate 71  CATH Prox RCA lesion is 100% stenosed. SVG graft was visualized by angiography and is normal in caliber. Mid LM to Dist LM lesion is 30% stenosed. Prox Cx to Dist Cx lesion is 70% stenosed. SVG graft was not visualized due to inability to cannulate. Prox LAD lesion is 90% stenosed. Ost 1st Diag lesion is 90% stenosed. 1. 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  As above, there were no graft markers. I could not locate the SVG to the OM but it is open based on competitive flow seen on antegrade injections  in the native Circumflex.  2. Aortic stenosis. Cath data is not consistent with echo data. Mean gradient 9 mmHg, peak to peak gradient 9 mmHg, AVA 2.24 cm2. The TEE images demonstrate movement of one of the leaflets. The mean gradient by  TEE was 26 mmHg.   ECHO: Left ventricle: Systolic function was normal. The estimated ejection fraction was in the range of 50% to 55%. Wall motion was normal; there were no regional wall motion abnormalities. Aortic valve: Cusp separation was reduced. There was moderate to severe stenosis. There was mild regurgitation. Valve area (VTI): 0.73 cm^2. Valve area (Vmax): 0.69 cm^2. Valve area (Vmean):  0.66 cm^2. Mitral valve: No evidence of vegetation. There was mild regurgitation. Left atrium: The atrium was mildly dilated. No evidence of thrombus in the atrial cavity or appendage. Right atrium: The atrium was mildly dilated. Atrial septum: No defect or patent foramen ovale was identified. Tricuspid valve: No evidence of vegetation. Pulmonic valve: No evidence of vegetation.      Neuro/Psych negative neurological ROS  negative psych ROS   GI/Hepatic negative GI ROS, Neg liver ROS,   Endo/Other  diabetes, Oral Hypoglycemic Agents  Renal/GU negative Renal ROS     Musculoskeletal negative musculoskeletal ROS (+)   Abdominal   Peds  Hematology HLD   Anesthesia Other Findings   Reproductive/Obstetrics                             Anesthesia Physical Anesthesia Plan  ASA: III  Anesthesia Plan: General   Post-op Pain Management:    Induction:   PONV Risk Score and Plan: 2 and Treatment may vary due to age or medical condition  Airway Management Planned: Nasal Cannula  Additional Equipment: None  Intra-op Plan:   Post-operative Plan:   Informed Consent: I have reviewed the patients History and Physical, chart, labs and discussed the procedure including the risks, benefits and alternatives for the proposed anesthesia with the patient or authorized representative who has indicated his/her understanding and acceptance.   Dental advisory given  Plan Discussed with: CRNA and Surgeon  Anesthesia Plan Comments:  Anesthesia Quick Evaluation  

## 2018-07-14 NOTE — CV Procedure (Signed)
     Transesophageal Echocardiogram Note  Peter Scott [id number] 10-27-1932  Procedure: Transesophageal Echocardiogram Indications: atrial fibrillation  Procedure Details Consent: Obtained Time Out: Verified patient identification, verified procedure, site/side was marked, verified correct patient position, special equipment/implants available, Radiology Safety Procedures followed,  medications/allergies/relevent history reviewed, required imaging and test results available.  Performed  Medications: Iv propofol administered by anesthesia staff.   No intracardiac source of embolism.  Complications: No apparent complications Patient did tolerate procedure well.  Ena Dawley, MD, [facility] 07/14/2018, 9:24 AM      Cardioversion Note  Peter Scott [id number] Mar 26, 1932  Procedure: DC Cardioversion Indications: atrial fibrillation  Procedure Details Consent: Obtained Time Out: Verified patient identification, verified procedure, site/side was marked, verified correct patient position, special equipment/implants available, Radiology Safety Procedures followed,  medications/allergies/relevent history reviewed, required imaging and test results available.  Performed  The patient has been on adequate anticoagulation.  The patient received IV propofol for sedation.  Synchronous cardioversion was performed at 120 joules.  The cardioversion was successful.    Complications: No apparent complications Patient did tolerate procedure well.   Ena Dawley, MD, [facility] 07/14/2018, 9:25 AM

## 2018-07-14 NOTE — Interval H&P Note (Signed)
History and Physical Interval Note:  07/14/2018 8:40 AM  Peter Scott  has presented today for surgery, with the diagnosis of atrial fibrillation  The various methods of treatment have been discussed with the patient and family. After consideration of risks, benefits and other options for treatment, the patient has consented to  Procedure(s): TRANSESOPHAGEAL ECHOCARDIOGRAM (TEE) (N/A) CARDIOVERSION (N/A) as a surgical intervention .  The patient's history has been reviewed, patient examined, no change in status, stable for surgery.  I have reviewed the patient's chart and labs.  Questions were answered to the patient's satisfaction.     Ena Dawley

## 2018-07-15 ENCOUNTER — Encounter (HOSPITAL_COMMUNITY): Payer: Self-pay | Admitting: Cardiology

## 2018-07-15 MED FILL — AMLODIPINE BESYLATE 10 MG T: 10 | 90 days supply | Qty: 45 | Fill #1

## 2018-07-16 ENCOUNTER — Ambulatory Visit (INDEPENDENT_AMBULATORY_CARE_PROVIDER_SITE_OTHER): Payer: Medicare Other | Admitting: Medical

## 2018-07-16 ENCOUNTER — Encounter: Payer: Self-pay | Admitting: Medical

## 2018-07-16 ENCOUNTER — Ambulatory Visit (HOSPITAL_BASED_OUTPATIENT_CLINIC_OR_DEPARTMENT_OTHER)
Admission: RE | Admit: 2018-07-16 | Discharge: 2018-07-16 | Disposition: A | Discharge status: Home / Self Care | Payer: Medicare Other | Source: Ambulatory Visit | Attending: Medical | Admitting: Medical

## 2018-07-16 VITALS — BP 131/63 | HR 58 | Temp 97.9°F | Resp 16 | Ht 68.0 in | Wt 170.8 lb

## 2018-07-16 DIAGNOSIS — R918 Other nonspecific abnormal finding of lung field: Secondary | ICD-10-CM | POA: Insufficient documentation

## 2018-07-16 DIAGNOSIS — I2583 Coronary atherosclerosis due to lipid rich plaque: Secondary | ICD-10-CM

## 2018-07-16 DIAGNOSIS — R067 Sneezing: Secondary | ICD-10-CM

## 2018-07-16 DIAGNOSIS — I2510 Atherosclerotic heart disease of native coronary artery without angina pectoris: Secondary | ICD-10-CM | POA: Diagnosis not present

## 2018-07-16 DIAGNOSIS — R05 Cough: Secondary | ICD-10-CM | POA: Insufficient documentation

## 2018-07-16 DIAGNOSIS — R0982 Postnasal drip: Secondary | ICD-10-CM | POA: Diagnosis not present

## 2018-07-16 DIAGNOSIS — J9811 Atelectasis: Secondary | ICD-10-CM | POA: Diagnosis not present

## 2018-07-16 DIAGNOSIS — R059 Cough, unspecified: Secondary | ICD-10-CM

## 2018-07-16 NOTE — Progress Notes (Signed)
Subjective:    Patient ID: Peter Scott, male    DOB: [birth year], 84 y.o.   MRN: [id number]  HPI  Pt in for follow up.  He states he is much improved from prior bronchitis type symptoms. Cough is resolved for most part per pt.(but then he did have random cough mid interview). Pt took antibiotic doxycycline. No fever, no chills or sweats. 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  He also reports successful cardioversion on Monday.    Review of Systems  Constitutional: Negative for chills, fatigue and fever.  HENT: Positive for postnasal drip and sneezing. Negative for congestion, ear discharge, ear pain, sinus pressure and sinus pain.   Respiratory: Negative for cough, chest tightness, shortness of breath and wheezing.        Very random seldom cough.  Cardiovascular: Negative for chest pain and palpitations.  Gastrointestinal: Negative for abdominal pain, blood in stool, diarrhea, nausea, rectal pain and vomiting.  Musculoskeletal: Negative for back pain.  Skin: Negative for rash.  Neurological: Negative for dizziness, speech difficulty and headaches.  Hematological: Negative for adenopathy. Does not bruise/bleed easily.  Psychiatric/Behavioral: Negative for behavioral problems, confusion and sleep disturbance. The patient is not nervous/anxious.     Past Medical History:  Diagnosis Date  . Aortic aneurysm ([place])   . Aortic stenosis   . CAD (coronary artery disease)   . Diabetes mellitus type II, controlled ([place])   . Heart disease    Ablation  . History of chicken pox   . Hyperlipidemia   . Hypertension   . S/P CABG x 4    2001 - [place]  . 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 Umbilical hernia      Social History   Socioeconomic History  . Marital status: Married    Spouse name: Not on file  . Number of children: 2  . Years of  education: Not on file  . Highest education level: Not on file  Occupational History  . Not on file  Social Needs  . Financial resource strain: Not on file  . Food insecurity:    Worry: Not on file    Inability: Not on file  . Transportation needs:    Medical: Not on file    Non-medical: Not on file  Tobacco Use  . Smoking status: Former Smoker    Packs/day: 1.00    Years: 3.00    Pack years: 3.00  . Smokeless tobacco: Never Used  Substance and Sexual Activity  . Alcohol use: No    Alcohol/week: 0.0 standard drinks  . Drug use: No  . Sexual activity: Not on file  Lifestyle  . Physical activity:    Days per week: Not on file    Minutes per session: Not on file  . Stress: Not on file  Relationships  . Social connections:    Talks on phone: Not on file    Gets together: Not on file    Attends religious service: Not on file    Active member of club or organization: Not on file    Attends meetings of clubs or organizations: Not on file    Relationship status: Not on file  . Intimate partner violence:    Fear of current or ex partner: Not on file    Emotionally abused: Not on file    Physically abused: Not on  file    Forced sexual activity: Not on file  Other Topics Concern  . Not on file  Social History Narrative  . Not on file    Past Surgical History:  Procedure Laterality Date  . ABLATION     Rhythm problem; rhythm unknown; [place]  . CARDIOVERSION N/A 07/14/2018   Procedure: CARDIOVERSION;  Surgeon: Dorothy Spark, MD;  Location: [facility] ENDOSCOPY;  Service: Cardiovascular;  Laterality: N/A;  . CORONARY ARTERY BYPASS GRAFT  2001  . EXPLORATION POST OPERATIVE OPEN HEART  2001, June  . INTRAOPERATIVE TRANSTHORACIC ECHOCARDIOGRAM N/A 04/01/2018   Procedure: INTRAOPERATIVE TRANSTHORACIC ECHOCARDIOGRAM;  Surgeon: Burnell Blanks, MD;  Location: [place];  Service: Open Heart Surgery;  Laterality: N/A;  . RIGHT/LEFT HEART CATH AND CORONARY/GRAFT ANGIOGRAPHY  N/A 01/31/2018   Procedure: RIGHT/LEFT HEART CATH AND CORONARY/GRAFT ANGIOGRAPHY;  Surgeon: Burnell Blanks, MD;  Location: [place] CV LAB;  Service: Cardiovascular;  Laterality: N/A;  . TEE WITHOUT CARDIOVERSION N/A 01/17/2018   Procedure: TRANSESOPHAGEAL ECHOCARDIOGRAM (TEE);  Surgeon: Lelon Perla, MD;  Location: [facility] ENDOSCOPY;  Service: Cardiovascular;  Laterality: N/A;  . TEE WITHOUT CARDIOVERSION N/A 07/14/2018   Procedure: TRANSESOPHAGEAL ECHOCARDIOGRAM (TEE);  Surgeon: Dorothy Spark, MD;  Location: [facility] ENDOSCOPY;  Service: Cardiovascular;  Laterality: N/A;  . TONSILLECTOMY    . TRANSCATHETER AORTIC VALVE REPLACEMENT, TRANSFEMORAL N/A 04/01/2018   Procedure: TRANSCATHETER AORTIC VALVE REPLACEMENT, TRANSFEMORAL;  Surgeon: Burnell Blanks, MD;  Location: [place];  Service: Open Heart Surgery;  Laterality: N/A;  . UMBILICAL HERNIA REPAIR  2015    Family History  Problem Relation Age of Onset  . Diabetes Mother 67       Deceased  . Heart disease Father 61       Deceased  . Heart disease Maternal Uncle   . Diabetes Maternal Uncle   . Heart disease Brother   . Diabetes Brother   . Diabetes Sister        #1  . Heart disease Sister        #2  . Hyperlipidemia Son        x2    No Known Allergies  Current Outpatient Medications on File Prior to Visit  Medication Sig Dispense Refill  . acetaminophen (TYLENOL) 500 MG tablet Take 1 tablet (500 mg total) by mouth every 6 (six) hours as needed. (Patient taking differently: Take 500 mg by mouth every 6 (six) hours as needed for moderate pain or headache. ) 30 tablet 0  . amLODipine (NORVASC) 10 MG tablet TAKE 1/2 TABLET BY MOUTH DAILY (Patient taking differently: Take 5 mg by mouth daily. ) 45 tablet 1  . amoxicillin (AMOXIL) 500 MG tablet Take 4 tablets (2,000 mg total) by mouth as directed. Take 1 hour prior to dental visits. (Patient taking differently: Take 2,000 mg by mouth See admin instructions. Take 2000 mg by  mouth 1 hour prior to dental visits.) 8 tablet 6  . apixaban (ELIQUIS) 5 MG TABS tablet Take 1 tablet (5 mg total) by mouth 2 (two) times daily. 60 tablet 6  . aspirin EC 81 MG tablet Take 81 mg by mouth daily.    Marland Kitchen atorvastatin (LIPITOR) 80 MG tablet TAKE 1/2 TABLET BY MOUTH AT BEDTIME (Patient taking differently: Take 40 mg by mouth at bedtime. ) 45 tablet 3  . glucose blood (BAYER CONTOUR TEST) test strip TEST ONCE DAILY FOR DIABETES Dx Code: E11.9 100 each 11  . glyBURIDE (DIABETA) 5 MG tablet TAKE 1/2 (  ONE-HALF) TABLET BY MOUTH TWICE DAILY WITH MEALS (Patient taking differently: Take 2.5 mg by mouth 2 (two) times daily with a meal. ) 90 tablet 0  . losartan (COZAAR) 100 MG tablet TAKE 1/2 TABLET BY MOUTH 2 TIMES A DAY (Patient taking differently: Take 50 mg by mouth 2 (two) times daily. ) 90 tablet 1  . metFORMIN (GLUCOPHAGE) 500 MG tablet TAKE 2 TABLETS BY MOUTH EVERY MORNING THEN TAKE 1 TABLET BY MOUTH EVERY EVENING (Patient taking differently: Take 500-1,000 mg by mouth See admin instructions. Take 1000 mg by mouth in the morning and take 500 mg by mouth in the evening) 270 tablet 1  . Sodium Fluoride (PREVIDENT 5000 BOOSTER PLUS) 1.1 % PSTE Place 1 application onto teeth daily.     Marland Kitchen spironolactone (ALDACTONE) 25 MG tablet TAKE 0.5 TABLETS (12.5 MG TOTAL) BY MOUTH DAILY. 45 tablet 1   No current facility-administered medications on file prior to visit.     BP 131/63   Pulse (!) 58   Temp 97.9 F (36.6 C) (Oral)   Resp 16   Ht 5\' 8"  (1.727 m)   Wt 170 lb 12.8 oz (77.5 kg)   SpO2 98%   BMI 25.97 kg/m       Objective:   Physical Exam  General  Mental Status - Alert. General Appearance - Well groomed. Not in acute distress.  Skin Rashes- No Rashes.  HEENT Head- Normal. Ear Auditory Canal - Left- Normal. Right - Normal.Tympanic Membrane- Left- Normal. Right- Normal. Eye Sclera/Conjunctiva- Left- Normal. Right- Normal. Nose & Sinuses Nasal Mucosa- Left-  Boggy and  Congested. Right-  Boggy and  Congested.Bilateral no  maxillary and no  frontal sinus pressure. Mouth & Throat Lips: Upper Lip- Normal: no dryness, cracking, pallor, cyanosis, or vesicular eruption. Lower Lip-Normal: no dryness, cracking, pallor, cyanosis or vesicular eruption. Buccal Mucosa- Bilateral- No Aphthous ulcers. Oropharynx- No Discharge or Erythema. +pnd. Tonsils: Characteristics- Bilateral- No Erythema or Congestion. Size/Enlargement- Bilateral- No enlargement. Discharge- bilateral-None.  Neck Neck- Supple. No Masses.   Chest and Lung Exam Auscultation: Breath Sounds:-Clear even and unlabored.  Cardiovascular Auscultation:Rythm- Regular, rate and rhythm. Murmurs & Other Heart Sounds:Ausculatation of the heart reveal- No Murmurs.  Lymphatic Head & Neck General Head & Neck Lymphatics: Bilateral: Description- No Localized lymphadenopathy.  Lower ext- no swelling to calfs. symmetric       Assessment & Plan:  For residual cough will get cxr to make to evaluate no opacity/pneumonia. Radiologist will compare to prior xray.  For possible mild cough related to pnd. Please keep in mind maybe allergies. Can use otc antihistamine if needed.  Glad your cardioversion went well.  Follow up as regularly scheduled with pcp or as needed  Mackie Pai, PA-C

## 2018-07-16 NOTE — Anesthesia Postprocedure Evaluation (Signed)
Anesthesia Post Note  Patient: Peter Scott  Procedure(s) Performed: TRANSESOPHAGEAL ECHOCARDIOGRAM (TEE) (N/A ) CARDIOVERSION (N/A )     Patient location during evaluation: Endoscopy Anesthesia Type: General Level of consciousness: awake and alert Pain management: pain level controlled Vital Signs Assessment: post-procedure vital signs reviewed and stable Respiratory status: spontaneous breathing, nonlabored ventilation, respiratory function stable and patient connected to nasal cannula oxygen Cardiovascular status: blood pressure returned to baseline and stable Postop Assessment: no apparent nausea or vomiting Anesthetic complications: no    Last Vitals:  Vitals:   07/14/18 0920 07/14/18 0930  BP: (!) 101/41 106/64  Pulse: 81 82  Resp: (!) 22 (!) 28  Temp:    SpO2: 97% 95%    Last Pain:  Vitals:   07/14/18 0930  TempSrc:   PainSc: 0-No pain                 Jaciel Diem

## 2018-07-16 NOTE — Patient Instructions (Signed)
For residual cough will get cxr to make to evaluate no opacity/pneumonia. Radiologist will compare to prior xray.  For possible mild cough related to pnd. Please keep in mind maybe allergies. Can use otc antihistamine if needed.  Glad your cardioversion went well.  Follow up as regularly scheduled with pcp or as needed

## 2018-07-17 ENCOUNTER — Telehealth: Payer: Self-pay | Admitting: Medical

## 2018-07-17 MED ORDER — AZITHROMYCIN 250 MG PO TABS: ORAL_TABLET | ORAL | 0 refills | Status: DC

## 2018-07-17 MED FILL — AZITHROMYCIN 250 MG TABLET: 250 | 5 days supply | Qty: 6 | Fill #0

## 2018-07-17 NOTE — Telephone Encounter (Signed)
Opened to review 

## 2018-07-24 ENCOUNTER — Other Ambulatory Visit: Payer: Self-pay | Admitting: Family Medicine

## 2018-07-28 NOTE — Progress Notes (Signed)
HPI: FU CAD andAS. Patient is status post coronary artery bypass graft in [place] in 2001. He also had ablation of what sounds to be SVT at that time. Carotid dopplers 11/15 showed plaque but no significant stenosis. Abdominal ultrasound January 2019 showed abdominal aortic aneurysm measuring 3.9 cm.  Recently found to have moderate to severe aortic stenosis.  Carotid Dopplers March 2019 showed 1 to 39% bilateral stenosis.  Patient had preoperative cardiac catheterization that revealed severe native three-vessel coronary artery disease with patency of grafts.  He underwent TAVR 5/19.  Last echocardiogram June 2019 showed ejection fraction 45 to 50%, moderate diastolic dysfunction, prior aortic valve replacement with mean gradient 10 mmHg, severe left atrial enlargement and moderate tricuspid regurgitation.  Patient found to be in atrial fibrillation at last office visit.  He underwent TEE guided cardioversion on August 26.  Ejection fraction 45 to 50% with well-seated prosthetic aortic valve, mild mitral regurgitation and severe left atrial enlargement. Since last seen,  patient denies dyspnea, chest pain, palpitations or syncope.  However he is having fatigue.  Current Outpatient Medications  Medication Sig Dispense Refill  . acetaminophen (TYLENOL) 500 MG tablet Take 1 tablet (500 mg total) by mouth every 6 (six) hours as needed. (Patient taking differently: Take 500 mg by mouth every 6 (six) hours as needed for moderate pain or headache. ) 30 tablet 0  . amLODipine (NORVASC) 10 MG tablet TAKE 1/2 TABLET BY MOUTH DAILY (Patient taking differently: Take 5 mg by mouth daily. ) 45 tablet 1  . amoxicillin (AMOXIL) 500 MG tablet Take 4 tablets (2,000 mg total) by mouth as directed. Take 1 hour prior to dental visits. (Patient taking differently: Take 2,000 mg by mouth See admin instructions. Take 2000 mg by mouth 1 hour prior to dental visits.) 8 tablet 6  . apixaban (ELIQUIS) 5 MG TABS tablet Take 1  tablet (5 mg total) by mouth 2 (two) times daily. 60 tablet 6  . aspirin EC 81 MG tablet Take 81 mg by mouth daily.    Marland Kitchen atorvastatin (LIPITOR) 80 MG tablet TAKE 1/2 TABLET BY MOUTH AT BEDTIME (Patient taking differently: Take 40 mg by mouth at bedtime. ) 45 tablet 3  . glucose blood (BAYER CONTOUR TEST) test strip TEST ONCE DAILY FOR DIABETES Dx Code: E11.9 100 each 11  . glyBURIDE (DIABETA) 5 MG tablet TAKE 1/2 (ONE-HALF) TABLET BY MOUTH TWICE DAILY WITH MEALS (Patient taking differently: Take 2.5 mg by mouth 2 (two) times daily with a meal. ) 90 tablet 0  . losartan (COZAAR) 100 MG tablet TAKE 1/2 TABLET BY MOUTH 2 TIMES A DAY (Patient taking differently: Take 50 mg by mouth 2 (two) times daily. ) 90 tablet 1  . metFORMIN (GLUCOPHAGE) 500 MG tablet TAKE 2 TABLETS BY MOUTH EVERY MORNING THEN TAKE 1 TABLET BY MOUTH EVERY EVENING (Patient taking differently: Take 500-1,000 mg by mouth See admin instructions. Take 1000 mg by mouth in the morning and take 500 mg by mouth in the evening) 270 tablet 1  . Sodium Fluoride (PREVIDENT 5000 BOOSTER PLUS) 1.1 % PSTE Place 1 application onto teeth daily.     Marland Kitchen spironolactone (ALDACTONE) 25 MG tablet TAKE 0.5 TABLETS (12.5 MG TOTAL) BY MOUTH DAILY. 45 tablet 1   No current facility-administered medications for this visit.      Past Medical History:  Diagnosis Date  . Aortic aneurysm ([place])   . Aortic stenosis   . CAD (coronary artery disease)   .  Diabetes mellitus type II, controlled ([place])   . Heart disease    Ablation  . History of chicken pox   . Hyperlipidemia   . Hypertension   . S/P CABG x 4    2001 - [place]  . 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 Umbilical hernia     Past Surgical History:  Procedure Laterality Date  . ABLATION     Rhythm problem; rhythm unknown; [place]  . CARDIOVERSION N/A 07/14/2018    Procedure: CARDIOVERSION;  Surgeon: Dorothy Spark, MD;  Location: [facility] ENDOSCOPY;  Service: Cardiovascular;  Laterality: N/A;  . CORONARY ARTERY BYPASS GRAFT  2001  . EXPLORATION POST OPERATIVE OPEN HEART  2001, June  . INTRAOPERATIVE TRANSTHORACIC ECHOCARDIOGRAM N/A 04/01/2018   Procedure: INTRAOPERATIVE TRANSTHORACIC ECHOCARDIOGRAM;  Surgeon: Burnell Blanks, MD;  Location: [place];  Service: Open Heart Surgery;  Laterality: N/A;  . RIGHT/LEFT HEART CATH AND CORONARY/GRAFT ANGIOGRAPHY N/A 01/31/2018   Procedure: RIGHT/LEFT HEART CATH AND CORONARY/GRAFT ANGIOGRAPHY;  Surgeon: Burnell Blanks, MD;  Location: [place] CV LAB;  Service: Cardiovascular;  Laterality: N/A;  . TEE WITHOUT CARDIOVERSION N/A 01/17/2018   Procedure: TRANSESOPHAGEAL ECHOCARDIOGRAM (TEE);  Surgeon: Lelon Perla, MD;  Location: [facility] ENDOSCOPY;  Service: Cardiovascular;  Laterality: N/A;  . TEE WITHOUT CARDIOVERSION N/A 07/14/2018   Procedure: TRANSESOPHAGEAL ECHOCARDIOGRAM (TEE);  Surgeon: Dorothy Spark, MD;  Location: [facility] ENDOSCOPY;  Service: Cardiovascular;  Laterality: N/A;  . TONSILLECTOMY    . TRANSCATHETER AORTIC VALVE REPLACEMENT, TRANSFEMORAL N/A 04/01/2018   Procedure: TRANSCATHETER AORTIC VALVE REPLACEMENT, TRANSFEMORAL;  Surgeon: Burnell Blanks, MD;  Location: [place];  Service: Open Heart Surgery;  Laterality: N/A;  . UMBILICAL HERNIA REPAIR  2015    Social History   Socioeconomic History  . Marital status: Married    Spouse name: Not on file  . Number of children: 2  . Years of education: Not on file  . Highest education level: Not on file  Occupational History  . Not on file  Social Needs  . Financial resource strain: Not on file  . Food insecurity:    Worry: Not on file    Inability: Not on file  . Transportation needs:    Medical: Not on file    Non-medical: Not on file  Tobacco Use  . Smoking status: Former Smoker    Packs/day: 1.00    Years: 3.00    Pack  years: 3.00  . Smokeless tobacco: Never Used  Substance and Sexual Activity  . Alcohol use: No    Alcohol/week: 0.0 standard drinks  . Drug use: No  . Sexual activity: Not on file  Lifestyle  . Physical activity:    Days per week: Not on file    Minutes per session: Not on file  . Stress: Not on file  Relationships  . Social connections:    Talks on phone: Not on file    Gets together: Not on file    Attends religious service: Not on file    Active member of club or organization: Not on file    Attends meetings of clubs or organizations: Not on file    Relationship status: Not on file  . Intimate partner violence:    Fear of current or ex partner: Not on file    Emotionally abused: Not on file    Physically abused: Not on file  Forced sexual activity: Not on file  Other Topics Concern  . Not on file  Social History Narrative  . Not on file    Family History  Problem Relation Age of Onset  . Diabetes Mother 66       Deceased  . Heart disease Father 24       Deceased  . Heart disease Maternal Uncle   . Diabetes Maternal Uncle   . Heart disease Brother   . Diabetes Brother   . Diabetes Sister        #1  . Heart disease Sister        #2  . Hyperlipidemia Son        x2    ROS: Fatigue but no fevers or chills, productive cough, hemoptysis, dysphasia, odynophagia, melena, hematochezia, dysuria, hematuria, rash, seizure activity, orthopnea, PND, pedal edema, claudication. Remaining systems are negative.  Physical Exam: Well-developed well-nourished in no acute distress.  Skin is warm and dry.  HEENT is normal.  Neck is supple.  Chest is clear to auscultation with normal expansion.  Cardiovascular exam is irregular Abdominal exam nontender or distended. No masses palpated. Extremities show no edema. neuro grossly intact  ECG-atrial fibrillation at a rate of 69.  Nonspecific ST changes.  Personally reviewed  A/P  1 paroxysmal atrial fibrillation-patient has  developed recurrent atrial fibrillation today.  We will continue apixaban at present dose.  His rate is controlled on no medications at present.  I am concerned that atrial fibrillation may be contributing to his fatigue.  He did not hold sinus rhythm on his own.  Add amiodarone 200 mg twice daily for 2 weeks.  Then decreased to 200 mg daily thereafter.  I will see him back in 4 to 6 weeks and if atrial fibrillation persists we will arrange cardioversion at that time.  Follow closely for any evidence of bradycardia with addition of amiodarone.  2 status post aortic valve replacement-continue aspirin.  Continue SBE prophylaxis.  3 abdominal aortic aneurysm-he will need follow-up ultrasound January 2020.  4 coronary artery disease status post coronary artery bypass and graft-patient denies chest pain.  Continue statin.  5 hyperlipidemia-continue statin.  6 hypertension-blood pressure is controlled.  Continue present medications and follow.  Kirk Ruths, MD

## 2018-07-30 ENCOUNTER — Encounter: Payer: Self-pay | Admitting: Cardiology

## 2018-07-30 ENCOUNTER — Ambulatory Visit (INDEPENDENT_AMBULATORY_CARE_PROVIDER_SITE_OTHER): Payer: Medicare Other | Admitting: Cardiology

## 2018-07-30 VITALS — BP 130/76 | HR 69 | Ht 68.0 in | Wt 170.8 lb

## 2018-07-30 DIAGNOSIS — I480 Paroxysmal atrial fibrillation: Secondary | ICD-10-CM

## 2018-07-30 DIAGNOSIS — Z952 Presence of prosthetic heart valve: Secondary | ICD-10-CM | POA: Diagnosis not present

## 2018-07-30 DIAGNOSIS — I2583 Coronary atherosclerosis due to lipid rich plaque: Secondary | ICD-10-CM | POA: Diagnosis not present

## 2018-07-30 DIAGNOSIS — I2510 Atherosclerotic heart disease of native coronary artery without angina pectoris: Secondary | ICD-10-CM

## 2018-07-30 MED ORDER — AMIODARONE HCL 200 MG PO TABS: ORAL_TABLET | ORAL | 3 refills | Status: DC

## 2018-07-30 MED FILL — AMIODARONE HCL 200 MG TABS: 200 | 76 days supply | Qty: 90 | Fill #0

## 2018-07-30 NOTE — Patient Instructions (Addendum)
Medication Instructions: Your Physician recommend you make the following changes to your medication. Start: Amiodarone 200 mg two times a day for 2 weeks the 200 mg daily thereafter.    If you need a refill on your cardiac medications before your next appointment, please call your pharmacy.   Labwork: None  Procedures/Testing: None  Follow-Up: Your physician wants you to follow-up in  4-6 weeks with Dr. [name].   Special Instructions: Eliquis Samples provided today.   Thank you for choosing [facility] at [facility]!!

## 2018-08-05 ENCOUNTER — Other Ambulatory Visit: Payer: Self-pay

## 2018-08-05 ENCOUNTER — Other Ambulatory Visit: Payer: Self-pay | Admitting: *Deleted

## 2018-08-05 DIAGNOSIS — I480 Paroxysmal atrial fibrillation: Secondary | ICD-10-CM

## 2018-08-05 MED ORDER — GLYBURIDE 5 MG PO TABS: 2.5000 mg | ORAL_TABLET | Freq: Two times a day (BID) | ORAL | 1 refills | Status: DC

## 2018-08-05 MED ORDER — LOSARTAN POTASSIUM 100 MG PO TABS: 50.0000 mg | ORAL_TABLET | Freq: Two times a day (BID) | ORAL | 1 refills | Status: DC

## 2018-08-05 MED ORDER — AMLODIPINE BESYLATE 10 MG PO TABS: 5.0000 mg | ORAL_TABLET | Freq: Every day | ORAL | 1 refills | Status: DC

## 2018-08-05 MED ORDER — SPIRONOLACTONE 25 MG PO TABS: 12.5000 mg | ORAL_TABLET | Freq: Every day | ORAL | 1 refills | Status: DC

## 2018-08-05 MED ORDER — ATORVASTATIN CALCIUM 80 MG PO TABS: 40.0000 mg | ORAL_TABLET | Freq: Every day | ORAL | 1 refills | Status: DC

## 2018-08-05 MED FILL — metFORMIN HCL 500 MG TABS: 500 | 90 days supply | Qty: 270 | Fill #1

## 2018-08-05 NOTE — Telephone Encounter (Signed)
Pt needs printed rx's for Amiodarone and Eliquis to take to the [place]. Please let pt know when this is done so he can pick them up.

## 2018-08-07 MED ORDER — AMIODARONE HCL 200 MG PO TABS: 200.0000 mg | ORAL_TABLET | Freq: Every day | ORAL | 3 refills | Status: DC

## 2018-08-07 NOTE — Addendum Note (Signed)
Addended by: Raiford Simmonds on: 08/07/2018 03:39 PM   Modules accepted: Orders

## 2018-08-07 NOTE — Telephone Encounter (Signed)
Spoke to patient. Patient states he does not need a prescription for eliquis - he had a older bottle the [place] used.  patient states he still needs a prescription for amiodarone. RN informed patient will leave for Dr [name] to sign and will call patient . Patient would like to pick prescription at [place] [facility]

## 2018-08-08 ENCOUNTER — Telehealth: Payer: Self-pay | Admitting: *Deleted

## 2018-08-08 NOTE — Telephone Encounter (Signed)
Received Physician Orders from [place] for testing strips, completed as much as possible; forwarded to provider/SLS 09/20

## 2018-08-13 ENCOUNTER — Other Ambulatory Visit: Payer: Self-pay | Admitting: *Deleted

## 2018-08-13 ENCOUNTER — Encounter: Payer: Self-pay | Admitting: Cardiology

## 2018-08-13 ENCOUNTER — Ambulatory Visit (INDEPENDENT_AMBULATORY_CARE_PROVIDER_SITE_OTHER): Payer: Medicare Other | Admitting: Cardiology

## 2018-08-13 VITALS — BP 132/68 | HR 75 | Ht 68.5 in | Wt 170.0 lb

## 2018-08-13 DIAGNOSIS — I2583 Coronary atherosclerosis due to lipid rich plaque: Secondary | ICD-10-CM | POA: Diagnosis not present

## 2018-08-13 DIAGNOSIS — E7800 Pure hypercholesterolemia, unspecified: Secondary | ICD-10-CM | POA: Diagnosis not present

## 2018-08-13 DIAGNOSIS — I10 Essential (primary) hypertension: Secondary | ICD-10-CM | POA: Diagnosis not present

## 2018-08-13 DIAGNOSIS — I714 Abdominal aortic aneurysm, without rupture, unspecified: Secondary | ICD-10-CM

## 2018-08-13 DIAGNOSIS — I480 Paroxysmal atrial fibrillation: Secondary | ICD-10-CM

## 2018-08-13 DIAGNOSIS — I2510 Atherosclerotic heart disease of native coronary artery without angina pectoris: Secondary | ICD-10-CM

## 2018-08-13 MED ORDER — AMIODARONE HCL 200 MG PO TABS: 200.0000 mg | ORAL_TABLET | Freq: Every day | ORAL | 3 refills | Status: AC

## 2018-08-13 MED ORDER — AMIODARONE HCL 200 MG PO TABS: 200.0000 mg | ORAL_TABLET | Freq: Every day | ORAL | 3 refills | Status: DC

## 2018-08-13 NOTE — Addendum Note (Signed)
Addended by: Cristopher Estimable on: 08/13/2018 08:24 AM   Modules accepted: Orders, SmartSet

## 2018-08-13 NOTE — H&P (View-Only) (Signed)
HPI: FU CAD andAS. Patient is status post coronary artery bypass graft in [place] in 2001. He also had ablation of what sounds to be SVT at that time. Carotid dopplers 11/15 showed plaque but no significant stenosis. Abdominal ultrasound January 2019 showed abdominal aortic aneurysm measuring 3.9 cm.Recently found to have moderate to severe aortic stenosis. Carotid Dopplers March 2019 showed 1 to 39% bilateral stenosis. Patient had preoperative cardiac catheterization that revealed severe native three-vessel coronary artery disease with patency of grafts. He underwent TAVR 5/19.Last echocardiogram June 2019 showed ejection fraction 45 to 50%, moderate diastolic dysfunction, prior aortic valve replacement with mean gradient 10 mmHg, severe left atrial enlargement and moderate tricuspid regurgitation.  Patient found to be in atrial fibrillation at previous office visit.  He underwent TEE guided cardioversion on August 26.  Ejection fraction 45 to 50% with well-seated prosthetic aortic valve, mild mitral regurgitation and severe left atrial enlargement. However when he followed up atrial fibrillation had recurred.  Amiodarone initiated.  Since last seen,he denies dyspnea, chest pain, palpitations or syncope.  He does have some fatigue.  Current Outpatient Medications  Medication Sig Dispense Refill  . acetaminophen (TYLENOL) 500 MG tablet Take 1 tablet (500 mg total) by mouth every 6 (six) hours as needed. (Patient taking differently: Take 500 mg by mouth every 6 (six) hours as needed for moderate pain or headache. ) 30 tablet 0  . amiodarone (PACERONE) 200 MG tablet Take 1 tablet (200 mg total) by mouth daily. 90 tablet 3  . amLODipine (NORVASC) 10 MG tablet Take 0.5 tablets (5 mg total) by mouth daily. 90 tablet 1  . amoxicillin (AMOXIL) 500 MG tablet Take 4 tablets (2,000 mg total) by mouth as directed. Take 1 hour prior to dental visits. (Patient taking differently: Take 2,000 mg by  mouth See admin instructions. Take 2000 mg by mouth 1 hour prior to dental visits.) 8 tablet 6  . apixaban (ELIQUIS) 5 MG TABS tablet Take 1 tablet (5 mg total) by mouth 2 (two) times daily. 60 tablet 6  . aspirin EC 81 MG tablet Take 81 mg by mouth daily.    Marland Kitchen atorvastatin (LIPITOR) 80 MG tablet Take 0.5 tablets (40 mg total) by mouth at bedtime. 90 tablet 1  . glucose blood (BAYER CONTOUR TEST) test strip TEST ONCE DAILY FOR DIABETES Dx Code: E11.9 100 each 11  . glyBURIDE (DIABETA) 5 MG tablet Take 0.5 tablets (2.5 mg total) by mouth 2 (two) times daily with a meal. 90 tablet 1  . losartan (COZAAR) 100 MG tablet Take 0.5 tablets (50 mg total) by mouth 2 (two) times daily. 90 tablet 1  . metFORMIN (GLUCOPHAGE) 500 MG tablet TAKE 2 TABLETS BY MOUTH EVERY MORNING THEN TAKE 1 TABLET BY MOUTH EVERY EVENING (Patient taking differently: Take 500-1,000 mg by mouth See admin instructions. Take 1000 mg by mouth in the morning and take 500 mg by mouth in the evening) 270 tablet 1  . Sodium Fluoride (PREVIDENT 5000 BOOSTER PLUS) 1.1 % PSTE Place 1 application onto teeth daily.     Marland Kitchen spironolactone (ALDACTONE) 25 MG tablet Take 0.5 tablets (12.5 mg total) by mouth daily. 45 tablet 1   No current facility-administered medications for this visit.      Past Medical History:  Diagnosis Date  . Aortic aneurysm ([place])   . Aortic stenosis   . CAD (coronary artery disease)   . Diabetes mellitus type II, controlled ([place])   . Heart disease  Ablation  . History of chicken pox   . Hyperlipidemia   . Hypertension   . S/P CABG x 4    2001 - [place]  . 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 Umbilical hernia     Past Surgical History:  Procedure Laterality Date  . ABLATION     Rhythm problem; rhythm unknown; [place]  . CARDIOVERSION N/A 07/14/2018   Procedure:  CARDIOVERSION;  Surgeon: Dorothy Spark, MD;  Location: [facility] ENDOSCOPY;  Service: Cardiovascular;  Laterality: N/A;  . CORONARY ARTERY BYPASS GRAFT  2001  . EXPLORATION POST OPERATIVE OPEN HEART  2001, June  . INTRAOPERATIVE TRANSTHORACIC ECHOCARDIOGRAM N/A 04/01/2018   Procedure: INTRAOPERATIVE TRANSTHORACIC ECHOCARDIOGRAM;  Surgeon: Burnell Blanks, MD;  Location: [place];  Service: Open Heart Surgery;  Laterality: N/A;  . RIGHT/LEFT HEART CATH AND CORONARY/GRAFT ANGIOGRAPHY N/A 01/31/2018   Procedure: RIGHT/LEFT HEART CATH AND CORONARY/GRAFT ANGIOGRAPHY;  Surgeon: Burnell Blanks, MD;  Location: [place] CV LAB;  Service: Cardiovascular;  Laterality: N/A;  . TEE WITHOUT CARDIOVERSION N/A 01/17/2018   Procedure: TRANSESOPHAGEAL ECHOCARDIOGRAM (TEE);  Surgeon: Lelon Perla, MD;  Location: [facility] ENDOSCOPY;  Service: Cardiovascular;  Laterality: N/A;  . TEE WITHOUT CARDIOVERSION N/A 07/14/2018   Procedure: TRANSESOPHAGEAL ECHOCARDIOGRAM (TEE);  Surgeon: Dorothy Spark, MD;  Location: [facility] ENDOSCOPY;  Service: Cardiovascular;  Laterality: N/A;  . TONSILLECTOMY    . TRANSCATHETER AORTIC VALVE REPLACEMENT, TRANSFEMORAL N/A 04/01/2018   Procedure: TRANSCATHETER AORTIC VALVE REPLACEMENT, TRANSFEMORAL;  Surgeon: Burnell Blanks, MD;  Location: [place];  Service: Open Heart Surgery;  Laterality: N/A;  . UMBILICAL HERNIA REPAIR  2015    Social History   Socioeconomic History  . Marital status: Married    Spouse name: Not on file  . Number of children: 2  . Years of education: Not on file  . Highest education level: Not on file  Occupational History  . Not on file  Social Needs  . Financial resource strain: Not on file  . Food insecurity:    Worry: Not on file    Inability: Not on file  . Transportation needs:    Medical: Not on file    Non-medical: Not on file  Tobacco Use  . Smoking status: Former Smoker    Packs/day: 1.00    Years: 3.00    Pack years: 3.00  .  Smokeless tobacco: Never Used  Substance and Sexual Activity  . Alcohol use: No    Alcohol/week: 0.0 standard drinks  . Drug use: No  . Sexual activity: Not on file  Lifestyle  . Physical activity:    Days per week: Not on file    Minutes per session: Not on file  . Stress: Not on file  Relationships  . Social connections:    Talks on phone: Not on file    Gets together: Not on file    Attends religious service: Not on file    Active member of club or organization: Not on file    Attends meetings of clubs or organizations: Not on file    Relationship status: Not on file  . Intimate partner violence:    Fear of current or ex partner: Not on file    Emotionally abused: Not on file    Physically abused: Not on file    Forced sexual activity: Not on file  Other Topics Concern  .  Not on file  Social History Narrative  . Not on file    Family History  Problem Relation Age of Onset  . Diabetes Mother 41       Deceased  . Heart disease Father 38       Deceased  . Heart disease Maternal Uncle   . Diabetes Maternal Uncle   . Heart disease Brother   . Diabetes Brother   . Diabetes Sister        #1  . Heart disease Sister        #2  . Hyperlipidemia Son        x2    ROS: Decreased sleep but  no fevers or chills, productive cough, hemoptysis, dysphasia, odynophagia, melena, hematochezia, dysuria, hematuria, rash, seizure activity, orthopnea, PND, pedal edema, claudication. Remaining systems are negative.  Physical Exam: Well-developed well-nourished in no acute distress.  Skin is warm and dry.  HEENT is normal.  Neck is supple.  Chest is clear to auscultation with normal expansion.  Cardiovascular exam is irregular Abdominal exam nontender or distended. No masses palpated. Extremities show no edema. neuro grossly intact  ECG-atrial fibrillation at a rate of 75.  Personally reviewed  A/P  1 paroxysmal atrial fibrillation-patient remains in atrial fibrillation today.   Continue amiodarone load.  We will plan to repeat cardioversion and hopefully addition of amiodarone will help him maintain sinus rhythm.  Continue apixaban at present dose.  He will need TSH, liver functions and chest x-ray when he returns in 3 months.  Patient is having problems with sleeping.  He wonders whether this may be related to amiodarone.  We will follow for now.  2 prior aortic valve replacement-continue aspirin and SBE prophylaxis.  3 abdominal aortic aneurysm-plan follow-up ultrasound January 2020.  4 hypertension-blood pressure is controlled.  Continue present medications.  5 hyperlipidemia-continue statin.  6 coronary artery disease status post coronary artery bypass and graft-no chest pain.  Continue statin.  Kirk Ruths, MD

## 2018-08-13 NOTE — Progress Notes (Signed)
HPI: FU CAD andAS. Patient is status post coronary artery bypass graft in [place] in 2001. He also had ablation of what sounds to be SVT at that time. Carotid dopplers 11/15 showed plaque but no significant stenosis. Abdominal ultrasound January 2019 showed abdominal aortic aneurysm measuring 3.9 cm.Recently found to have moderate to severe aortic stenosis. Carotid Dopplers March 2019 showed 1 to 39% bilateral stenosis. Patient had preoperative cardiac catheterization that revealed severe native three-vessel coronary artery disease with patency of grafts. He underwent TAVR 5/19.Last echocardiogram June 2019 showed ejection fraction 45 to 50%, moderate diastolic dysfunction, prior aortic valve replacement with mean gradient 10 mmHg, severe left atrial enlargement and moderate tricuspid regurgitation.  Patient found to be in atrial fibrillation at previous office visit.  He underwent TEE guided cardioversion on August 26.  Ejection fraction 45 to 50% with well-seated prosthetic aortic valve, mild mitral regurgitation and severe left atrial enlargement. However when he followed up atrial fibrillation had recurred.  Amiodarone initiated.  Since last seen,he denies dyspnea, chest pain, palpitations or syncope.  He does have some fatigue.  Current Outpatient Medications  Medication Sig Dispense Refill  . acetaminophen (TYLENOL) 500 MG tablet Take 1 tablet (500 mg total) by mouth every 6 (six) hours as needed. (Patient taking differently: Take 500 mg by mouth every 6 (six) hours as needed for moderate pain or headache. ) 30 tablet 0  . amiodarone (PACERONE) 200 MG tablet Take 1 tablet (200 mg total) by mouth daily. 90 tablet 3  . amLODipine (NORVASC) 10 MG tablet Take 0.5 tablets (5 mg total) by mouth daily. 90 tablet 1  . amoxicillin (AMOXIL) 500 MG tablet Take 4 tablets (2,000 mg total) by mouth as directed. Take 1 hour prior to dental visits. (Patient taking differently: Take 2,000 mg by  mouth See admin instructions. Take 2000 mg by mouth 1 hour prior to dental visits.) 8 tablet 6  . apixaban (ELIQUIS) 5 MG TABS tablet Take 1 tablet (5 mg total) by mouth 2 (two) times daily. 60 tablet 6  . aspirin EC 81 MG tablet Take 81 mg by mouth daily.    Marland Kitchen atorvastatin (LIPITOR) 80 MG tablet Take 0.5 tablets (40 mg total) by mouth at bedtime. 90 tablet 1  . glucose blood (BAYER CONTOUR TEST) test strip TEST ONCE DAILY FOR DIABETES Dx Code: E11.9 100 each 11  . glyBURIDE (DIABETA) 5 MG tablet Take 0.5 tablets (2.5 mg total) by mouth 2 (two) times daily with a meal. 90 tablet 1  . losartan (COZAAR) 100 MG tablet Take 0.5 tablets (50 mg total) by mouth 2 (two) times daily. 90 tablet 1  . metFORMIN (GLUCOPHAGE) 500 MG tablet TAKE 2 TABLETS BY MOUTH EVERY MORNING THEN TAKE 1 TABLET BY MOUTH EVERY EVENING (Patient taking differently: Take 500-1,000 mg by mouth See admin instructions. Take 1000 mg by mouth in the morning and take 500 mg by mouth in the evening) 270 tablet 1  . Sodium Fluoride (PREVIDENT 5000 BOOSTER PLUS) 1.1 % PSTE Place 1 application onto teeth daily.     Marland Kitchen spironolactone (ALDACTONE) 25 MG tablet Take 0.5 tablets (12.5 mg total) by mouth daily. 45 tablet 1   No current facility-administered medications for this visit.      Past Medical History:  Diagnosis Date  . Aortic aneurysm ([place])   . Aortic stenosis   . CAD (coronary artery disease)   . Diabetes mellitus type II, controlled ([place])   . Heart disease  Ablation  . History of chicken pox   . Hyperlipidemia   . Hypertension   . S/P CABG x 4    2001 - [place]  . 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 Umbilical hernia     Past Surgical History:  Procedure Laterality Date  . ABLATION     Rhythm problem; rhythm unknown; [place]  . CARDIOVERSION N/A 07/14/2018   Procedure:  CARDIOVERSION;  Surgeon: Dorothy Spark, MD;  Location: [facility] ENDOSCOPY;  Service: Cardiovascular;  Laterality: N/A;  . CORONARY ARTERY BYPASS GRAFT  2001  . EXPLORATION POST OPERATIVE OPEN HEART  2001, June  . INTRAOPERATIVE TRANSTHORACIC ECHOCARDIOGRAM N/A 04/01/2018   Procedure: INTRAOPERATIVE TRANSTHORACIC ECHOCARDIOGRAM;  Surgeon: Burnell Blanks, MD;  Location: [place];  Service: Open Heart Surgery;  Laterality: N/A;  . RIGHT/LEFT HEART CATH AND CORONARY/GRAFT ANGIOGRAPHY N/A 01/31/2018   Procedure: RIGHT/LEFT HEART CATH AND CORONARY/GRAFT ANGIOGRAPHY;  Surgeon: Burnell Blanks, MD;  Location: [place] CV LAB;  Service: Cardiovascular;  Laterality: N/A;  . TEE WITHOUT CARDIOVERSION N/A 01/17/2018   Procedure: TRANSESOPHAGEAL ECHOCARDIOGRAM (TEE);  Surgeon: Lelon Perla, MD;  Location: [facility] ENDOSCOPY;  Service: Cardiovascular;  Laterality: N/A;  . TEE WITHOUT CARDIOVERSION N/A 07/14/2018   Procedure: TRANSESOPHAGEAL ECHOCARDIOGRAM (TEE);  Surgeon: Dorothy Spark, MD;  Location: [facility] ENDOSCOPY;  Service: Cardiovascular;  Laterality: N/A;  . TONSILLECTOMY    . TRANSCATHETER AORTIC VALVE REPLACEMENT, TRANSFEMORAL N/A 04/01/2018   Procedure: TRANSCATHETER AORTIC VALVE REPLACEMENT, TRANSFEMORAL;  Surgeon: Burnell Blanks, MD;  Location: [place];  Service: Open Heart Surgery;  Laterality: N/A;  . UMBILICAL HERNIA REPAIR  2015    Social History   Socioeconomic History  . Marital status: Married    Spouse name: Not on file  . Number of children: 2  . Years of education: Not on file  . Highest education level: Not on file  Occupational History  . Not on file  Social Needs  . Financial resource strain: Not on file  . Food insecurity:    Worry: Not on file    Inability: Not on file  . Transportation needs:    Medical: Not on file    Non-medical: Not on file  Tobacco Use  . Smoking status: Former Smoker    Packs/day: 1.00    Years: 3.00    Pack years: 3.00  .  Smokeless tobacco: Never Used  Substance and Sexual Activity  . Alcohol use: No    Alcohol/week: 0.0 standard drinks  . Drug use: No  . Sexual activity: Not on file  Lifestyle  . Physical activity:    Days per week: Not on file    Minutes per session: Not on file  . Stress: Not on file  Relationships  . Social connections:    Talks on phone: Not on file    Gets together: Not on file    Attends religious service: Not on file    Active member of club or organization: Not on file    Attends meetings of clubs or organizations: Not on file    Relationship status: Not on file  . Intimate partner violence:    Fear of current or ex partner: Not on file    Emotionally abused: Not on file    Physically abused: Not on file    Forced sexual activity: Not on file  Other Topics Concern  .  Not on file  Social History Narrative  . Not on file    Family History  Problem Relation Age of Onset  . Diabetes Mother 28       Deceased  . Heart disease Father 64       Deceased  . Heart disease Maternal Uncle   . Diabetes Maternal Uncle   . Heart disease Brother   . Diabetes Brother   . Diabetes Sister        #1  . Heart disease Sister        #2  . Hyperlipidemia Son        x2    ROS: Decreased sleep but  no fevers or chills, productive cough, hemoptysis, dysphasia, odynophagia, melena, hematochezia, dysuria, hematuria, rash, seizure activity, orthopnea, PND, pedal edema, claudication. Remaining systems are negative.  Physical Exam: Well-developed well-nourished in no acute distress.  Skin is warm and dry.  HEENT is normal.  Neck is supple.  Chest is clear to auscultation with normal expansion.  Cardiovascular exam is irregular Abdominal exam nontender or distended. No masses palpated. Extremities show no edema. neuro grossly intact  ECG-atrial fibrillation at a rate of 75.  Personally reviewed  A/P  1 paroxysmal atrial fibrillation-patient remains in atrial fibrillation today.   Continue amiodarone load.  We will plan to repeat cardioversion and hopefully addition of amiodarone will help him maintain sinus rhythm.  Continue apixaban at present dose.  He will need TSH, liver functions and chest x-ray when he returns in 3 months.  Patient is having problems with sleeping.  He wonders whether this may be related to amiodarone.  We will follow for now.  2 prior aortic valve replacement-continue aspirin and SBE prophylaxis.  3 abdominal aortic aneurysm-plan follow-up ultrasound January 2020.  4 hypertension-blood pressure is controlled.  Continue present medications.  5 hyperlipidemia-continue statin.  6 coronary artery disease status post coronary artery bypass and graft-no chest pain.  Continue statin.  Kirk Ruths, MD

## 2018-08-13 NOTE — Patient Instructions (Signed)
  You are scheduled for a Cardioversion on 19-9-19 with Dr. Marlou Porch.  Please arrive at the [facility] (Main Entrance A) at [facility]: [address] [place], [place] [address] at [place]  (1 hour prior to procedure unless lab work is needed; if lab work is needed arrive 1.5 hours ahead)  DIET: Nothing to eat or drink after midnight except a sip of water with medications (see medication instructions below)  Medication Instructions:  Continue your anticoagulant: ELIQUIS You will need to continue your anticoagulant after your procedure until you  are told by your  Provider that it is safe to stop   DO NOT TAKE METFORMIN THE MORNING OF THE TEST  You must have a responsible person to drive you home and stay in the waiting area during your procedure. Failure to do so could result in cancellation.  Bring your insurance cards.  *Special Note: Every effort is made to have your procedure done on time. Occasionally there are emergencies that occur at the hospital that may cause delays. Please be patient if a delay does occur.    Your physician recommends that you schedule a follow-up appointment in: [place]

## 2018-08-18 ENCOUNTER — Telehealth: Payer: Self-pay | Admitting: *Deleted

## 2018-08-18 NOTE — Telephone Encounter (Signed)
Received Physician Orders from [place] for testing strips, completed as much as possible; forwarded to provider/SLS 09/30

## 2018-08-20 DIAGNOSIS — Z789 Other specified health status: Secondary | ICD-10-CM | POA: Diagnosis not present

## 2018-08-25 ENCOUNTER — Ambulatory Visit (INDEPENDENT_AMBULATORY_CARE_PROVIDER_SITE_OTHER): Payer: Medicare Other

## 2018-08-25 DIAGNOSIS — Z23 Encounter for immunization: Secondary | ICD-10-CM | POA: Diagnosis not present

## 2018-08-27 ENCOUNTER — Ambulatory Visit (HOSPITAL_COMMUNITY)
Admission: RE | Admit: 2018-08-27 | Discharge: 2018-08-27 | Disposition: A | Discharge status: Home / Self Care | Payer: Medicare Other | Source: Ambulatory Visit | Attending: Cardiology | Admitting: Cardiology

## 2018-08-27 ENCOUNTER — Ambulatory Visit (HOSPITAL_COMMUNITY): Payer: Medicare Other | Admitting: Anesthesiology

## 2018-08-27 ENCOUNTER — Encounter (HOSPITAL_COMMUNITY): Payer: Self-pay | Admitting: *Deleted

## 2018-08-27 ENCOUNTER — Encounter (HOSPITAL_COMMUNITY)
Admission: RE | Disposition: A | Discharge status: Home / Self Care | Payer: Self-pay | Source: Ambulatory Visit | Attending: Cardiology

## 2018-08-27 DIAGNOSIS — Z952 Presence of prosthetic heart valve: Secondary | ICD-10-CM | POA: Insufficient documentation

## 2018-08-27 DIAGNOSIS — E785 Hyperlipidemia, unspecified: Secondary | ICD-10-CM | POA: Insufficient documentation

## 2018-08-27 DIAGNOSIS — Z7901 Long term (current) use of anticoagulants: Secondary | ICD-10-CM | POA: Diagnosis not present

## 2018-08-27 DIAGNOSIS — Z79899 Other long term (current) drug therapy: Secondary | ICD-10-CM | POA: Diagnosis not present

## 2018-08-27 DIAGNOSIS — I2510 Atherosclerotic heart disease of native coronary artery without angina pectoris: Secondary | ICD-10-CM | POA: Diagnosis not present

## 2018-08-27 DIAGNOSIS — Z951 Presence of aortocoronary bypass graft: Secondary | ICD-10-CM | POA: Insufficient documentation

## 2018-08-27 DIAGNOSIS — Z7984 Long term (current) use of oral hypoglycemic drugs: Secondary | ICD-10-CM | POA: Diagnosis not present

## 2018-08-27 DIAGNOSIS — Z87891 Personal history of nicotine dependence: Secondary | ICD-10-CM | POA: Insufficient documentation

## 2018-08-27 DIAGNOSIS — Z833 Family history of diabetes mellitus: Secondary | ICD-10-CM | POA: Insufficient documentation

## 2018-08-27 DIAGNOSIS — I471 Supraventricular tachycardia: Secondary | ICD-10-CM | POA: Diagnosis not present

## 2018-08-27 DIAGNOSIS — Z9889 Other specified postprocedural states: Secondary | ICD-10-CM | POA: Insufficient documentation

## 2018-08-27 DIAGNOSIS — Z8619 Personal history of other infectious and parasitic diseases: Secondary | ICD-10-CM | POA: Insufficient documentation

## 2018-08-27 DIAGNOSIS — Z7982 Long term (current) use of aspirin: Secondary | ICD-10-CM | POA: Insufficient documentation

## 2018-08-27 DIAGNOSIS — I714 Abdominal aortic aneurysm, without rupture: Secondary | ICD-10-CM | POA: Diagnosis not present

## 2018-08-27 DIAGNOSIS — I10 Essential (primary) hypertension: Secondary | ICD-10-CM | POA: Diagnosis not present

## 2018-08-27 DIAGNOSIS — E119 Type 2 diabetes mellitus without complications: Secondary | ICD-10-CM | POA: Diagnosis not present

## 2018-08-27 DIAGNOSIS — I4891 Unspecified atrial fibrillation: Secondary | ICD-10-CM | POA: Diagnosis not present

## 2018-08-27 DIAGNOSIS — I480 Paroxysmal atrial fibrillation: Secondary | ICD-10-CM | POA: Diagnosis not present

## 2018-08-27 DIAGNOSIS — Z8249 Family history of ischemic heart disease and other diseases of the circulatory system: Secondary | ICD-10-CM | POA: Diagnosis not present

## 2018-08-27 HISTORY — PX: CARDIOVERSION: SHX1299

## 2018-08-27 LAB — POCT I-STAT 4, (NA,K, GLUC, HGB,HCT)
GLUCOSE: 103 mg/dL — AB (ref 70–99)
Glucose, Bld: 105 mg/dL — ABNORMAL HIGH (ref 70–99)
HCT: 42 % (ref 39.0–52.0)
HCT: 40 % (ref 39.0–52.0)
Hemoglobin: 14.3 g/dL (ref 13.0–17.0)
Hemoglobin: 13.6 g/dL (ref 13.0–17.0)
Potassium: 6.2 mmol/L — ABNORMAL HIGH (ref 3.5–5.1)
Potassium: 4.2 mmol/L (ref 3.5–5.1)
SODIUM: 136 mmol/L (ref 135–145)
Sodium: 139 mmol/L (ref 135–145)

## 2018-08-27 SURGERY — CARDIOVERSION
Anesthesia: General

## 2018-08-27 MED ORDER — PROPOFOL 10 MG/ML IV BOLUS
INTRAVENOUS | Status: DC | PRN
  Administered 2018-08-27: 60 mg via INTRAVENOUS

## 2018-08-27 MED ORDER — SODIUM CHLORIDE 0.9% FLUSH: 3.0000 mL | INTRAVENOUS | Status: DC | PRN

## 2018-08-27 MED ORDER — SODIUM CHLORIDE 0.9 % IV SOLN
250.0000 mL | INTRAVENOUS | Status: DC
  Administered 2018-08-27: 13:00:00 via INTRAVENOUS

## 2018-08-27 MED ORDER — SODIUM CHLORIDE 0.9% FLUSH: 3.0000 mL | Freq: Two times a day (BID) | INTRAVENOUS | Status: DC

## 2018-08-27 MED ORDER — LIDOCAINE HCL (CARDIAC) PF 100 MG/5ML IV SOSY
PREFILLED_SYRINGE | INTRAVENOUS | Status: DC | PRN
  Administered 2018-08-27: 40 mg via INTRATRACHEAL

## 2018-08-27 NOTE — CV Procedure (Signed)
    Electrical Cardioversion Procedure Note Peter Scott [id number] 1932/08/31  Procedure: Electrical Cardioversion Indications:  Atrial Fibrillation  Time Out: Verified patient identification, verified procedure,medications/allergies/relevent history reviewed, required imaging and test results available.  Performed  Procedure Details  The patient was NPO after midnight. Anesthesia was administered at the beside  by Dr. Glennon Mac with propofol.  Cardioversion was performed with synchronized biphasic defibrillation via AP pads with 120 joules.  1 attempt(s) were performed.  The patient converted to normal sinus rhythm. The patient tolerated the procedure well   IMPRESSION:  Successful cardioversion of atrial fibrillation    [name] 08/27/2018, 1:12 PM

## 2018-08-27 NOTE — Discharge Instructions (Signed)
Electrical Cardioversion, Care After °This sheet gives you information about how to care for yourself after your procedure. Your health care provider may also give you more specific instructions. If you have problems or questions, contact your health care provider. °What can I expect after the procedure? °After the procedure, it is common to have: °· Some redness on the skin where the shocks were given. ° °Follow these instructions at home: °· Do not drive for 24 hours if you were given a medicine to help you relax (sedative). °· Take over-the-counter and prescription medicines only as told by your health care provider. °· Ask your health care provider how to check your pulse. Check it often. °· Rest for 48 hours after the procedure or as told by your health care provider. °· Avoid or limit your caffeine use as told by your health care provider. °Contact a health care provider if: °· You feel like your heart is beating too quickly or your pulse is not regular. °· You have a serious muscle cramp that does not go away. °Get help right away if: °· You have discomfort in your chest. °· You are dizzy or you feel faint. °· You have trouble breathing or you are short of breath. °· Your speech is slurred. °· You have trouble moving an arm or leg on one side of your body. °· Your fingers or toes turn cold or blue. °This information is not intended to replace advice given to you by your health care provider. Make sure you discuss any questions you have with your health care provider. °Document Released: 08/26/2013 Document Revised: 06/08/2016 Document Reviewed: 05/11/2016 °Elsevier Interactive Patient Education © 2018 [employer] ° °

## 2018-08-27 NOTE — Addendum Note (Signed)
Addendum  created 08/27/18 1459 by Annye Asa, MD   Sign clinical note

## 2018-08-27 NOTE — Anesthesia Postprocedure Evaluation (Addendum)
Anesthesia Post Note  Patient: Peter Scott  Procedure(s) Performed: CARDIOVERSION (N/A )     Patient location during evaluation: Endoscopy Anesthesia Type: General Level of consciousness: awake and alert, oriented and patient cooperative Pain management: pain level controlled Vital Signs Assessment: post-procedure vital signs reviewed and stable Respiratory status: spontaneous breathing, nonlabored ventilation and respiratory function stable Cardiovascular status: blood pressure returned to baseline and stable Postop Assessment: no apparent nausea or vomiting Anesthetic complications: no    Last Vitals:  Vitals:   08/27/18 1217 08/27/18 1312  BP: (!) 168/69 118/66  Pulse:  60  Resp: (!) 22 (!) 26  Temp: 36.6 C (!) 36.1 C  SpO2: 98% 95%    Last Pain:  Vitals:   08/27/18 1312  TempSrc: Oral  PainSc: 0-No pain                 Ashley Montminy,E. Sierra Bissonette

## 2018-08-27 NOTE — Anesthesia Preprocedure Evaluation (Addendum)
Anesthesia Evaluation  Patient identified by MRN, date of birth, ID band Patient awake    Reviewed: Allergy & Precautions, NPO status , Patient's Chart, lab work & pertinent test results  History of Anesthesia Complications Negative for: history of anesthetic complications  Airway Mallampati: I  TM Distance: >3 FB Neck ROM: Full    Dental  (+) Caps, Dental Advisory Given   Pulmonary former smoker,    breath sounds clear to auscultation       Cardiovascular hypertension, Pt. on medications (-) angina+ CAD, + CABG and + Peripheral Vascular Disease (h/o AAA)  + dysrhythmias Atrial Fibrillation + Valvular Problems/Murmurs (now s/p TAVR) AS  Rhythm:Irregular Rate:Normal  8/19 ECHO:  45% to 50%. No evidence of thrombus. - Aortic valve: A 26 mm Edwards-SAPIEN TAVR valve sits well in the aortic position. There is no central regurgitation or paravalvular leak. Mild MR   Neuro/Psych negative neurological ROS     GI/Hepatic negative GI ROS, Neg liver ROS,   Endo/Other  diabetes (glu 103), Oral Hypoglycemic Agents  Renal/GU negative Renal ROS     Musculoskeletal   Abdominal   Peds  Hematology eliquis   Anesthesia Other Findings   Reproductive/Obstetrics                            Anesthesia Physical Anesthesia Plan  ASA: III  Anesthesia Plan: General   Post-op Pain Management:    Induction: Intravenous  PONV Risk Score and Plan: 2 and Treatment may vary due to age or medical condition  Airway Management Planned: Natural Airway and Mask  Additional Equipment:   Intra-op Plan:   Post-operative Plan:   Informed Consent: I have reviewed the patients History and Physical, chart, labs and discussed the procedure including the risks, benefits and alternatives for the proposed anesthesia with the patient or authorized representative who has indicated his/her understanding and acceptance.    Dental advisory given  Plan Discussed with: Surgeon and CRNA  Anesthesia Plan Comments:         Anesthesia Quick Evaluation

## 2018-08-27 NOTE — Transfer of Care (Signed)
Immediate Anesthesia Transfer of Care Note  Patient: Peter Scott  Procedure(s) Performed: CARDIOVERSION (N/A )  Patient Location: PACU and Endoscopy Unit  Anesthesia Type:General  Level of Consciousness: patient cooperative and responds to stimulation  Airway & Oxygen Therapy: Patient Spontanous Breathing  Post-op Assessment: Report given to RN and Post -op Vital signs reviewed and stable  Post vital signs: Reviewed and stable  Last Vitals:  Vitals Value Taken Time  BP    Temp    Pulse    Resp    SpO2      Last Pain:  Vitals:   08/27/18 1217  TempSrc: (P) Oral  PainSc: (P) 0-No pain         Complications: No apparent anesthesia complications

## 2018-08-27 NOTE — Interval H&P Note (Signed)
History and Physical Interval Note:  08/27/2018 12:17 PM  Peter Scott  has presented today for surgery, with the diagnosis of A-FIB  The various methods of treatment have been discussed with the patient and family. After consideration of risks, benefits and other options for treatment, the patient has consented to  Procedure(s): CARDIOVERSION (N/A) as a surgical intervention .  The patient's history has been reviewed, patient examined, no change in status, stable for surgery.  I have reviewed the patient's chart and labs.  Questions were answered to the patient's satisfaction.     [employer]

## 2018-08-29 ENCOUNTER — Encounter (HOSPITAL_COMMUNITY): Payer: Self-pay | Admitting: Cardiology

## 2018-09-10 ENCOUNTER — Ambulatory Visit: Payer: Medicare Other | Admitting: Cardiology

## 2018-09-17 ENCOUNTER — Ambulatory Visit: Payer: Medicare Other | Admitting: Cardiology

## 2018-09-19 ENCOUNTER — Encounter: Payer: Self-pay | Admitting: Family Medicine

## 2018-09-22 NOTE — Telephone Encounter (Signed)
Will call patient to schedule visit for foot exam. Ok for regular ov?

## 2018-09-23 NOTE — Progress Notes (Signed)
[facility], [place], [place] [address] [phone]  Date:  09/29/2018   Name:  Peter Scott   DOB:  [birth year]   MRN:  [id number]  PCP:  [name], MD    Chief Complaint: Diabetes (foot exam, a1c, reaction to glipizide-heart racing, has started taking glyburide again)   History of Present Illness:  Peter Scott is a 84 y.o. very pleasant male patient who presents with the following:  Checking on his DM today History of DM, a fib, aortic valve replacement, CAD, hyperlipidemia  Lab Results  Component Value Date   HGBA1C 7.6 (H) 03/28/2018   Foot exam due Eye exam: he will go next month  Flu:  Done Due for an A1c, lipids- he is not fasting today   Admission for a fib/ cardioversion last month  He stopped glipizide and went back to glyburide- he is taking 1/2 tab BID  He is also on metformin   He has noted some lower blood sugars and may run down to 50 or so - we may need to back off on his treatment   He notes that over the summer he was treated for bronchitis with azithromycin and had an abnormal CXR 07/16/18-  CHEST - 2 VIEW COMPARISON:  07/03/2018 FINDINGS: Hypoventilation with bibasilar atelectasis/infiltrate unchanged. Right upper lobe airspace disease improved. Postop CABG and TAVR.  Negative for heart failure or effusion IMPRESSION: Bibasilar atelectasis/infiltrate unchanged. Improvement in right upper lobe atelectasis.  Will repeat for him today  Patient Active Problem List   Diagnosis Date Noted  . Paroxysmal atrial fibrillation (HCC)   . 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 S/P CABG x 4   . Severe aortic stenosis   . Medicare annual wellness visit, subsequent 10/04/2014  . Aortic stenosis 09/29/2014  . Bruit 09/29/2014  . S/P gastric bypass 07/23/2014  . Abdominal aortic aneurysm ([place]) 07/23/2014  . BCC (basal cell carcinoma) 07/23/2014   . SCC (squamous cell carcinoma), arm 07/23/2014  . Essential hypertension, benign 07/08/2014  . Coronary artery disease 07/08/2014  . Hyperlipidemia LDL goal <100 07/08/2014  . Type II diabetes mellitus, well controlled ([place]) 07/08/2014  . Colon cancer screening 07/08/2014    Past Medical History:  Diagnosis Date  . Aortic aneurysm ([place])   . Aortic stenosis   . CAD (coronary artery disease)   . Diabetes mellitus type II, controlled ([place])   . Heart disease    Ablation  . History of chicken pox   . Hyperlipidemia   . Hypertension   . S/P CABG x 4    2001 - [place]  . 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 Umbilical hernia     Past Surgical History:  Procedure Laterality Date  . ABLATION     Rhythm problem; rhythm unknown; [place]  . CARDIOVERSION N/A 07/14/2018   Procedure: CARDIOVERSION;  Surgeon: Dorothy Spark, MD;  Location: [facility] ENDOSCOPY;  Service: Cardiovascular;  Laterality: N/A;  . CARDIOVERSION N/A 08/27/2018   Procedure: CARDIOVERSION;  Surgeon: Jerline Pain, MD;  Location: [facility] ENDOSCOPY;  Service: Cardiovascular;  Laterality: N/A;  . CORONARY ARTERY BYPASS GRAFT  2001  . EXPLORATION POST OPERATIVE OPEN HEART  2001, June  . INTRAOPERATIVE TRANSTHORACIC ECHOCARDIOGRAM N/A 04/01/2018   Procedure: INTRAOPERATIVE TRANSTHORACIC ECHOCARDIOGRAM;  Surgeon: Angelena Form,  Annita Brod, MD;  Location: [facility] [place];  Service: Open Heart Surgery;  Laterality: N/A;  . RIGHT/LEFT HEART CATH AND CORONARY/GRAFT ANGIOGRAPHY N/A 01/31/2018   Procedure: RIGHT/LEFT HEART CATH AND CORONARY/GRAFT ANGIOGRAPHY;  Surgeon: Burnell Blanks, MD;  Location: [facility] CV LAB;  Service: Cardiovascular;  Laterality: N/A;  . TEE WITHOUT CARDIOVERSION N/A 01/17/2018   Procedure: TRANSESOPHAGEAL ECHOCARDIOGRAM (TEE);  Surgeon: Lelon Perla, MD;  Location: [facility]  ENDOSCOPY;  Service: Cardiovascular;  Laterality: N/A;  . TEE WITHOUT CARDIOVERSION N/A 07/14/2018   Procedure: TRANSESOPHAGEAL ECHOCARDIOGRAM (TEE);  Surgeon: Dorothy Spark, MD;  Location: [facility] ENDOSCOPY;  Service: Cardiovascular;  Laterality: N/A;  . TONSILLECTOMY    . TRANSCATHETER AORTIC VALVE REPLACEMENT, TRANSFEMORAL N/A 04/01/2018   Procedure: TRANSCATHETER AORTIC VALVE REPLACEMENT, TRANSFEMORAL;  Surgeon: Burnell Blanks, MD;  Location: [facility];  Service: Open Heart Surgery;  Laterality: N/A;  . UMBILICAL HERNIA REPAIR  2015    Social History   Tobacco Use  . Smoking status: Former Smoker    Packs/day: 1.00    Years: 3.00    Pack years: 3.00  . Smokeless tobacco: Never Used  Substance Use Topics  . Alcohol use: No    Alcohol/week: 0.0 standard drinks  . Drug use: No    Family History  Problem Relation Age of Onset  . Diabetes Mother 8       Deceased  . Heart disease Father 56       Deceased  . Heart disease Maternal Uncle   . Diabetes Maternal Uncle   . Heart disease Brother   . Diabetes Brother   . Diabetes Sister        #1  . Heart disease Sister        #2  . Hyperlipidemia Son        x2    Allergies  Allergen Reactions  . Glipizide Palpitations    "Heart racing"    Medication list has been reviewed and updated.  Current Outpatient Medications on File Prior to Visit  Medication Sig Dispense Refill  . acetaminophen (TYLENOL) 500 MG tablet Take 1 tablet (500 mg total) by mouth every 6 (six) hours as needed. (Patient taking differently: Take 500 mg by mouth every 6 (six) hours as needed for moderate pain or headache. ) 30 tablet 0  . amiodarone (PACERONE) 200 MG tablet Take 1 tablet (200 mg total) by mouth daily. 90 tablet 3  . amLODipine (NORVASC) 10 MG tablet Take 0.5 tablets (5 mg total) by mouth daily. 90 tablet 1  . amoxicillin (AMOXIL) 500 MG tablet Take 4 tablets (2,000 mg total) by mouth as directed. Take 1 hour prior to dental visits.  (Patient taking differently: Take 2,000 mg by mouth See admin instructions. Take 2000 mg by mouth 1 hour prior to dental visits.) 8 tablet 6  . apixaban (ELIQUIS) 5 MG TABS tablet Take 1 tablet (5 mg total) by mouth 2 (two) times daily. 60 tablet 6  . aspirin EC 81 MG tablet Take 81 mg by mouth at bedtime.     Marland Kitchen atorvastatin (LIPITOR) 80 MG tablet Take 0.5 tablets (40 mg total) by mouth at bedtime. 90 tablet 1  . glucose blood (BAYER CONTOUR TEST) test strip TEST ONCE DAILY FOR DIABETES Dx Code: E11.9 100 each 11  . losartan (COZAAR) 100 MG tablet Take 0.5 tablets (50 mg total) by mouth 2 (two) times daily. 90 tablet 1  . metFORMIN (GLUCOPHAGE) 500 MG tablet TAKE 2 TABLETS BY MOUTH EVERY  MORNING THEN TAKE 1 TABLET BY MOUTH EVERY EVENING (Patient taking differently: Take 500-1,000 mg by mouth See admin instructions. Take 1000 mg by mouth in the morning and take 500 mg by mouth in the evening) 270 tablet 1  . Sodium Fluoride (PREVIDENT 5000 BOOSTER PLUS) 1.1 % PSTE Place 1 application onto teeth at bedtime.     Marland Kitchen spironolactone (ALDACTONE) 25 MG tablet Take 0.5 tablets (12.5 mg total) by mouth daily. (Patient taking differently: Take 12.5 mg by mouth at bedtime. ) 45 tablet 1   No current facility-administered medications on file prior to visit.     Review of Systems:  As per HPI- otherwise negative. No fever or chills He is overall feeling well    Physical Examination: Vitals:   09/29/18 1017  BP: (!) 148/70  Pulse: (!) 58  Resp: 16  Temp: 97.9 F (36.6 C)  SpO2: 97%   Vitals:   09/29/18 1017  Weight: 174 lb 9.6 oz (79.2 kg)  Height: 5' 8.5" (1.74 m)   Body mass index is 26.16 kg/m. Ideal Body Weight: Weight in (lb) to have BMI = 25: 166.5  GEN: WDWN, NAD, Non-toxic, A & O x 3, normal weight, looks well  HEENT: Atraumatic, Normocephalic. Neck supple. No masses, No LAD.  Bilateral TM wnl, oropharynx normal.  PEERL,EOMI.   Ears and Nose: No external deformity. CV: RRR, No  M/G/R. No JVD. No thrill. No extra heart sounds. PULM: CTA B, no wheezes, crackles, rhonchi. No retractions. No resp. distress. No accessory muscle use. EXTR: No c/c/e NEURO Normal gait.  PSYCH: Normally interactive. Conversant. Not depressed or anxious appearing.  Calm demeanor.  Foot exam done today  Assessment and Plan :Controlled type 2 diabetes mellitus without complication, without long-term current use of insulin ([place]) - Plan: Comprehensive metabolic panel, Hemoglobin A1c, glyBURIDE (DIABETA) 5 MG tablet  Essential hypertension, benign  Mixed hyperlipidemia - Plan: Lipid panel  Cough - Plan: DG Chest 2 View  Following up today Check A1c He has complained of some low blood sugars- may need to decrease his diabetes regimen Can be more lenient on his A1c due to age   He has noted a cough and his last CRX was not normal.  Cough is much better, but we will repeat his CXR today to make sure it has cleared    Signed Lamar Blinks, MD  Received his results, message to pt Decrease glyburide to 1/2 tablet once a day Recheck in 3-4 months   Results for orders placed or performed in visit on 09/29/18  Comprehensive metabolic panel  Result Value Ref Range   Sodium 137 135 - 145 mEq/L   Potassium 4.4 3.5 - 5.1 mEq/L   Chloride 100 96 - 112 mEq/L   CO2 30 19 - 32 mEq/L   Glucose, Bld 115 (H) 70 - 99 mg/dL   BUN 25 (H) 6 - 23 mg/dL   Creatinine, Ser 1.36 0.40 - 1.50 mg/dL   Total Bilirubin 0.4 0.2 - 1.2 mg/dL   Alkaline Phosphatase 55 39 - 117 U/L   AST 20 0 - 37 U/L   ALT 27 0 - 53 U/L   Total Protein 6.6 6.0 - 8.3 g/dL   Albumin 3.9 3.5 - 5.2 g/dL   Calcium 9.2 8.4 - 10.5 mg/dL   GFR 52.98 (L) >60.00 mL/min  Hemoglobin A1c  Result Value Ref Range   Hgb A1c MFr Bld 7.7 (H) 4.6 - 6.5 %  Lipid panel  Result Value Ref  Range   Cholesterol 139 0 - 200 mg/dL   Triglycerides 74.0 0.0 - 149.0 mg/dL   HDL 40.20 >39.00 mg/dL   VLDL 14.8 0.0 - 40.0 mg/dL   LDL Cholesterol 84  0 - 99 mg/dL   Total CHOL/HDL Ratio 3    NonHDL 98.59    Dg Chest 2 View  Result Date: 09/29/2018 CLINICAL DATA:  Follow-up atelectasis or infiltrate from studies in August 2019 EXAM: CHEST - 2 VIEW COMPARISON:  PA and lateral chest x-rays of July 16, 2018 and July 03, 2018. FINDINGS: The lungs remain mildly hypoinflated. Patchy infiltrate or atelectasis in the right mid upper lung has cleared. The interstitial markings remain coarse in the left infrahilar region and just above the right hemidiaphragm medially. The heart is normal in size. A prosthetic aortic valve cage is visible. The lower most sternal wire is broken and is stable. The pulmonary vascularity is not engorged. There is prominent thoracic kyphosis which is stable. IMPRESSION: Interval clearing of the infiltrate in the right mid lung. Persistent subsegmental atelectasis or scarring at both lung bases. No CHF. The findings are accentuated by mild chronic hypoinflation. Electronically Signed   By: David  Martinique M.D.   On: 09/29/2018 11:12

## 2018-09-25 ENCOUNTER — Telehealth: Payer: Self-pay | Admitting: Physician Assistant

## 2018-09-25 NOTE — Telephone Encounter (Signed)
  [facility]   MULTIDISCIPLINARY HEART VALVE TEAM   [name] underwent TAVR in 03/2018.  He was placed on aspirin and Plavix.  Plan was to stop Plavix after 6 months of therapy and continue on aspirin.  I had promised the patient that I would call him to remind him when to stop the Plavix.  Since that time he has been found to have atrial fibrillation and placed on Eliquis and underwent recent DCCV.  He continues on aspirin and Eliquis.  Since it has been 6 months since his valve surgery I have advised him to stop taking aspirin and continue on Eliquis indefinitely.  I will see him back for his one-year appointment next May.  Angelena Form PA-C  [facility]

## 2018-09-29 ENCOUNTER — Ambulatory Visit (HOSPITAL_BASED_OUTPATIENT_CLINIC_OR_DEPARTMENT_OTHER)
Admission: RE | Admit: 2018-09-29 | Discharge: 2018-09-29 | Disposition: A | Discharge status: Home / Self Care | Payer: Medicare Other | Source: Ambulatory Visit | Attending: Family Medicine | Admitting: Family Medicine

## 2018-09-29 ENCOUNTER — Ambulatory Visit (INDEPENDENT_AMBULATORY_CARE_PROVIDER_SITE_OTHER): Payer: Medicare Other | Admitting: Family Medicine

## 2018-09-29 ENCOUNTER — Encounter: Payer: Self-pay | Admitting: Family Medicine

## 2018-09-29 VITALS — BP 148/70 | HR 58 | Temp 97.9°F | Resp 16 | Ht 68.5 in | Wt 174.6 lb

## 2018-09-29 DIAGNOSIS — I2510 Atherosclerotic heart disease of native coronary artery without angina pectoris: Secondary | ICD-10-CM | POA: Diagnosis not present

## 2018-09-29 DIAGNOSIS — I2583 Coronary atherosclerosis due to lipid rich plaque: Secondary | ICD-10-CM

## 2018-09-29 DIAGNOSIS — E119 Type 2 diabetes mellitus without complications: Secondary | ICD-10-CM

## 2018-09-29 DIAGNOSIS — R918 Other nonspecific abnormal finding of lung field: Secondary | ICD-10-CM | POA: Diagnosis not present

## 2018-09-29 DIAGNOSIS — I10 Essential (primary) hypertension: Secondary | ICD-10-CM | POA: Diagnosis not present

## 2018-09-29 DIAGNOSIS — R059 Cough, unspecified: Secondary | ICD-10-CM

## 2018-09-29 DIAGNOSIS — R05 Cough: Secondary | ICD-10-CM | POA: Insufficient documentation

## 2018-09-29 DIAGNOSIS — E782 Mixed hyperlipidemia: Secondary | ICD-10-CM | POA: Diagnosis not present

## 2018-09-29 LAB — COMPREHENSIVE METABOLIC PANEL
ALT: 27 U/L (ref 0–53)
AST: 20 U/L (ref 0–37)
Albumin: 3.9 g/dL (ref 3.5–5.2)
Alkaline Phosphatase: 55 U/L (ref 39–117)
BILIRUBIN TOTAL: 0.4 mg/dL (ref 0.2–1.2)
BUN: 25 mg/dL — ABNORMAL HIGH (ref 6–23)
CO2: 30 meq/L (ref 19–32)
CREATININE: 1.36 mg/dL (ref 0.40–1.50)
Calcium: 9.2 mg/dL (ref 8.4–10.5)
Chloride: 100 mEq/L (ref 96–112)
GFR: 52.98 mL/min — ABNORMAL LOW (ref >60.00)
GLUCOSE: 115 mg/dL — AB (ref 70–99)
Potassium: 4.4 mEq/L (ref 3.5–5.1)
SODIUM: 137 meq/L (ref 135–145)
Total Protein: 6.6 g/dL (ref 6.0–8.3)

## 2018-09-29 LAB — LIPID PANEL
CHOL/HDL RATIO: 3
Cholesterol: 139 mg/dL (ref 0–200)
HDL: 40.2 mg/dL (ref >39.00)
LDL CALC: 84 mg/dL (ref 0–99)
NonHDL: 98.59
TRIGLYCERIDES: 74 mg/dL (ref 0.0–149.0)
VLDL: 14.8 mg/dL (ref 0.0–40.0)

## 2018-09-29 LAB — HEMOGLOBIN A1C: Hgb A1c MFr Bld: 7.7 % — ABNORMAL HIGH (ref 4.6–6.5)

## 2018-09-29 MED ORDER — GLYBURIDE 5 MG PO TABS: 2.5000 mg | ORAL_TABLET | Freq: Two times a day (BID) | ORAL | 3 refills | Status: DC

## 2018-09-29 MED FILL — glyBURIDE 5 MG TABS: 5 | 90 days supply | Qty: 90 | Fill #0

## 2018-09-29 NOTE — Patient Instructions (Addendum)
It was good to see you today - I will be in touch with your labs asap If your A1c is lower we may need to back off on your diabetes meds- we want to avoid low blood sugars  Please stop by and get your chest x-ray   Happy [facility] day and thank you!

## 2018-09-30 NOTE — Progress Notes (Signed)
HPI: FU CAD andAS. Patient is status post coronary artery bypass graft in [place] in 2001. He also had ablation of what sounds to be SVT at that time. Carotid dopplers 11/15 showed plaque but no significant stenosis. Abdominal ultrasound January 2019 showed abdominal aortic aneurysm measuring 3.9 cm.Carotid Dopplers March 2019 showed 1 to 39% bilateral stenosis. Patient had preoperative cardiac catheterization that revealed severe native three-vessel coronary artery disease with patency of grafts. He underwent TAVR 5/19.Last echocardiogram June 2019 showed ejection fraction 45 to 50%, moderate diastolic dysfunction, prior aortic valve replacement with mean gradient 10 mmHg, severe left atrial enlargement and moderate tricuspid regurgitation.Patient found to be in atrial fibrillation at previous office visit. He underwent TEE guided cardioversion on August 26. Ejection fraction 45 to 50% with well-seated prosthetic aortic valve, mild mitral regurgitation and severe left atrial enlargement. However when he followed up atrial fibrillation had recurred.  Amiodarone initiated.    Had repeat successful cardioversion on August 27, 2018.  Since last seen,he has mild dyspnea with more vigorous activities.  No orthopnea, PND, pedal edema, chest pain or syncope.  Current Outpatient Medications  Medication Sig Dispense Refill  . acetaminophen (TYLENOL) 500 MG tablet Take 1 tablet (500 mg total) by mouth every 6 (six) hours as needed. (Patient taking differently: Take 500 mg by mouth every 6 (six) hours as needed for moderate pain or headache. ) 30 tablet 0  . amiodarone (PACERONE) 200 MG tablet Take 1 tablet (200 mg total) by mouth daily. 90 tablet 3  . amLODipine (NORVASC) 10 MG tablet Take 0.5 tablets (5 mg total) by mouth daily. 90 tablet 1  . amoxicillin (AMOXIL) 500 MG tablet Take 4 tablets (2,000 mg total) by mouth as directed. Take 1 hour prior to dental visits. (Patient taking differently:  Take 2,000 mg by mouth See admin instructions. Take 2000 mg by mouth 1 hour prior to dental visits.) 8 tablet 6  . apixaban (ELIQUIS) 5 MG TABS tablet Take 1 tablet (5 mg total) by mouth 2 (two) times daily. 60 tablet 6  . aspirin EC 81 MG tablet Take 81 mg by mouth at bedtime.     Marland Kitchen atorvastatin (LIPITOR) 80 MG tablet Take 0.5 tablets (40 mg total) by mouth at bedtime. 90 tablet 1  . glucose blood (BAYER CONTOUR TEST) test strip TEST ONCE DAILY FOR DIABETES Dx Code: E11.9 100 each 11  . glyBURIDE (DIABETA) 5 MG tablet Take 0.5 tablets (2.5 mg total) by mouth 2 (two) times daily with a meal. 90 tablet 3  . losartan (COZAAR) 100 MG tablet Take 0.5 tablets (50 mg total) by mouth 2 (two) times daily. 90 tablet 1  . metFORMIN (GLUCOPHAGE) 500 MG tablet TAKE 2 TABLETS BY MOUTH EVERY MORNING THEN TAKE 1 TABLET BY MOUTH EVERY EVENING (Patient taking differently: Take 500-1,000 mg by mouth See admin instructions. Take 1000 mg by mouth in the morning and take 500 mg by mouth in the evening) 270 tablet 1  . Sodium Fluoride (PREVIDENT 5000 BOOSTER PLUS) 1.1 % PSTE Place 1 application onto teeth at bedtime.     Marland Kitchen spironolactone (ALDACTONE) 25 MG tablet Take 0.5 tablets (12.5 mg total) by mouth daily. (Patient taking differently: Take 12.5 mg by mouth at bedtime. ) 45 tablet 1   No current facility-administered medications for this visit.      Past Medical History:  Diagnosis Date  . Aortic aneurysm ([place])   . Aortic stenosis   . CAD (coronary artery  disease)   . Diabetes mellitus type II, controlled ([place])   . Heart disease    Ablation  . History of chicken pox   . Hyperlipidemia   . Hypertension   . S/P CABG x 4    2001 - [place]  . 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 Umbilical hernia     Past Surgical History:  Procedure Laterality Date  . ABLATION     Rhythm  problem; rhythm unknown; [place]  . CARDIOVERSION N/A 07/14/2018   Procedure: CARDIOVERSION;  Surgeon: Dorothy Spark, MD;  Location: [facility] ENDOSCOPY;  Service: Cardiovascular;  Laterality: N/A;  . CARDIOVERSION N/A 08/27/2018   Procedure: CARDIOVERSION;  Surgeon: Jerline Pain, MD;  Location: [facility] ENDOSCOPY;  Service: Cardiovascular;  Laterality: N/A;  . CORONARY ARTERY BYPASS GRAFT  2001  . EXPLORATION POST OPERATIVE OPEN HEART  2001, June  . INTRAOPERATIVE TRANSTHORACIC ECHOCARDIOGRAM N/A 04/01/2018   Procedure: INTRAOPERATIVE TRANSTHORACIC ECHOCARDIOGRAM;  Surgeon: Burnell Blanks, MD;  Location: [facility];  Service: Open Heart Surgery;  Laterality: N/A;  . RIGHT/LEFT HEART CATH AND CORONARY/GRAFT ANGIOGRAPHY N/A 01/31/2018   Procedure: RIGHT/LEFT HEART CATH AND CORONARY/GRAFT ANGIOGRAPHY;  Surgeon: Burnell Blanks, MD;  Location: [facility] CV LAB;  Service: Cardiovascular;  Laterality: N/A;  . TEE WITHOUT CARDIOVERSION N/A 01/17/2018   Procedure: TRANSESOPHAGEAL ECHOCARDIOGRAM (TEE);  Surgeon: Lelon Perla, MD;  Location: [facility] ENDOSCOPY;  Service: Cardiovascular;  Laterality: N/A;  . TEE WITHOUT CARDIOVERSION N/A 07/14/2018   Procedure: TRANSESOPHAGEAL ECHOCARDIOGRAM (TEE);  Surgeon: Dorothy Spark, MD;  Location: [facility] ENDOSCOPY;  Service: Cardiovascular;  Laterality: N/A;  . TONSILLECTOMY    . TRANSCATHETER AORTIC VALVE REPLACEMENT, TRANSFEMORAL N/A 04/01/2018   Procedure: TRANSCATHETER AORTIC VALVE REPLACEMENT, TRANSFEMORAL;  Surgeon: Burnell Blanks, MD;  Location: [facility];  Service: Open Heart Surgery;  Laterality: N/A;  . UMBILICAL HERNIA REPAIR  2015    Social History   Socioeconomic History  . Marital status: Married    Spouse name: Not on file  . Number of children: 2  . Years of education: Not on file  . Highest education level: Not on file  Occupational History  . Not on file  Social Needs  . Financial resource strain: Not on file  . Food  insecurity:    Worry: Not on file    Inability: Not on file  . Transportation needs:    Medical: Not on file    Non-medical: Not on file  Tobacco Use  . Smoking status: Former Smoker    Packs/day: 1.00    Years: 3.00    Pack years: 3.00  . Smokeless tobacco: Never Used  Substance and Sexual Activity  . Alcohol use: No    Alcohol/week: 0.0 standard drinks  . Drug use: No  . Sexual activity: Not on file  Lifestyle  . Physical activity:    Days per week: Not on file    Minutes per session: Not on file  . Stress: Not on file  Relationships  . Social connections:    Talks on phone: Not on file    Gets together: Not on file    Attends religious service: Not on file    Active member of club or organization: Not on file    Attends meetings of clubs or organizations: Not on file    Relationship status: Not on file  . Intimate partner  violence:    Fear of current or ex partner: Not on file    Emotionally abused: Not on file    Physically abused: Not on file    Forced sexual activity: Not on file  Other Topics Concern  . Not on file  Social History Narrative  . Not on file    Family History  Problem Relation Age of Onset  . Diabetes Mother 76       Deceased  . Heart disease Father 38       Deceased  . Heart disease Maternal Uncle   . Diabetes Maternal Uncle   . Heart disease Brother   . Diabetes Brother   . Diabetes Sister        #1  . Heart disease Sister        #2  . Hyperlipidemia Son        x2    ROS: no fevers or chills, productive cough, hemoptysis, dysphasia, odynophagia, melena, hematochezia, dysuria, hematuria, rash, seizure activity, orthopnea, PND, pedal edema, claudication. Remaining systems are negative.  Physical Exam: Well-developed well-nourished in no acute distress.  Skin is warm and dry.  HEENT is normal.  Neck is supple.  Chest is clear to auscultation with normal expansion.  Cardiovascular exam is regular rate and rhythm.  2/6 systolic  murmur left sternal border.  No diastolic murmur. Abdominal exam nontender or distended. No masses palpated. Extremities show no edema. neuro grossly intact  ECG-sinus rhythm with first-degree AV block, right bundle branch block.  Personally reviewed  A/P  1 paroxysmal atrial fibrillation-patient underwent recent cardioversion and is holding sinus rhythm.  Plan to continue amiodarone at present dose.  When he returns in 3 months we will plan chest x-ray, TSH and liver functions.  Continue apixaban.  2 prior aortic valve replacement-continue SBE prophylaxis.  Discontinue aspirin.  3 abdominal aortic aneurysm-plan follow-up abdominal ultrasound January 2020.  4 coronary artery disease status post prior coronary artery bypass and graft-patient denies chest pain.  Continue medical therapy with statin.  No aspirin given need for apixaban.  5 hyperlipidemia-continue statin.  6 hypertension-patient's blood pressure is mildly elevated but typically controlled.  Continue present medications and follow.  Kirk Ruths, MD

## 2018-10-08 ENCOUNTER — Ambulatory Visit (INDEPENDENT_AMBULATORY_CARE_PROVIDER_SITE_OTHER): Payer: Medicare Other | Admitting: Cardiology

## 2018-10-08 ENCOUNTER — Other Ambulatory Visit: Payer: Self-pay

## 2018-10-08 ENCOUNTER — Encounter: Payer: Self-pay | Admitting: Cardiology

## 2018-10-08 VITALS — BP 148/84 | HR 91 | Ht 68.5 in | Wt 174.0 lb

## 2018-10-08 DIAGNOSIS — I10 Essential (primary) hypertension: Secondary | ICD-10-CM | POA: Diagnosis not present

## 2018-10-08 DIAGNOSIS — I714 Abdominal aortic aneurysm, without rupture, unspecified: Secondary | ICD-10-CM

## 2018-10-08 DIAGNOSIS — I480 Paroxysmal atrial fibrillation: Secondary | ICD-10-CM

## 2018-10-08 DIAGNOSIS — I2510 Atherosclerotic heart disease of native coronary artery without angina pectoris: Secondary | ICD-10-CM | POA: Diagnosis not present

## 2018-10-08 DIAGNOSIS — Z952 Presence of prosthetic heart valve: Secondary | ICD-10-CM

## 2018-10-08 DIAGNOSIS — I2583 Coronary atherosclerosis due to lipid rich plaque: Secondary | ICD-10-CM | POA: Diagnosis not present

## 2018-10-08 NOTE — Patient Instructions (Signed)
Medication Instructions:   STOP ASPIRIN  Follow-Up:  Your physician recommends that you schedule a follow-up appointment in: [place]

## 2018-10-20 ENCOUNTER — Other Ambulatory Visit: Payer: Self-pay | Admitting: Family Medicine

## 2018-10-20 MED FILL — metFORMIN HCL 500 MG TABS: 500 | 90 days supply | Qty: 270 | Fill #0

## 2018-12-30 DIAGNOSIS — H02042 Spastic entropion of right lower eyelid: Secondary | ICD-10-CM | POA: Diagnosis not present

## 2018-12-31 NOTE — Progress Notes (Signed)
HPI: FU CAD andAS. Patient is status post coronary artery bypass graft in [place] in 2001. He also had ablation of what sounds to be SVT at that time. Abdominal ultrasound January 2019 showed abdominal aortic aneurysm measuring 3.9 cm.Carotid Dopplers March 2019 showed 1 to 39% bilateral stenosis. Patient had preoperative cardiac catheterization that revealed severe native three-vessel coronary artery disease with patency of grafts. He underwent TAVR 5/19.Last echocardiogram June 2019 showed ejection fraction 45 to 50%, moderate diastolic dysfunction, prior aortic valve replacement with mean gradient 10 mmHg, severe left atrial enlargement and moderate tricuspid regurgitation.Patient found to be in atrial fibrillation atpreviousoffice visit. He underwent TEE guided cardioversion on August 26. Ejection fraction 45 to 50% with well-seated prosthetic aortic valve, mild mitral regurgitation and severe left atrial enlargement.However when he followed up atrial fibrillation had recurred. Amiodarone initiated.   Had repeat successful cardioversion on August 27, 2018.  Since last seen,he denies dyspnea, chest pain, palpitations or syncope.  Occasional nosebleed.  Some weakness.  Current Outpatient Medications  Medication Sig Dispense Refill  . acetaminophen (TYLENOL) 500 MG tablet Take 1 tablet (500 mg total) by mouth every 6 (six) hours as needed. (Patient taking differently: Take 500 mg by mouth every 6 (six) hours as needed for moderate pain or headache. ) 30 tablet 0  . amiodarone (PACERONE) 200 MG tablet Take 1 tablet (200 mg total) by mouth daily. 90 tablet 3  . amLODipine (NORVASC) 10 MG tablet Take 0.5 tablets (5 mg total) by mouth daily. 90 tablet 1  . amoxicillin (AMOXIL) 500 MG tablet Take 4 tablets (2,000 mg total) by mouth as directed. Take 1 hour prior to dental visits. (Patient taking differently: Take 2,000 mg by mouth See admin instructions. Take 2000 mg by mouth 1 hour  prior to dental visits.) 8 tablet 6  . apixaban (ELIQUIS) 5 MG TABS tablet Take 1 tablet (5 mg total) by mouth 2 (two) times daily. 60 tablet 6  . atorvastatin (LIPITOR) 80 MG tablet Take 0.5 tablets (40 mg total) by mouth at bedtime. 90 tablet 1  . glucose blood (BAYER CONTOUR TEST) test strip TEST ONCE DAILY FOR DIABETES Dx Code: E11.9 100 each 11  . glyBURIDE (DIABETA) 5 MG tablet Take 0.5 tablets (2.5 mg total) by mouth 2 (two) times daily with a meal. 90 tablet 3  . losartan (COZAAR) 100 MG tablet Take 0.5 tablets (50 mg total) by mouth 2 (two) times daily. 90 tablet 1  . metFORMIN (GLUCOPHAGE) 500 MG tablet Take 1-2 tablets (500-1,000 mg total) by mouth See admin instructions. Take 1000 mg by mouth in the morning and take 500 mg by mouth in the evening 270 tablet 1  . Sodium Fluoride (PREVIDENT 5000 BOOSTER PLUS) 1.1 % PSTE Place 1 application onto teeth at bedtime.     Marland Kitchen spironolactone (ALDACTONE) 25 MG tablet Take 0.5 tablets (12.5 mg total) by mouth daily. (Patient taking differently: Take 12.5 mg by mouth at bedtime. ) 45 tablet 1   No current facility-administered medications for this visit.      Past Medical History:  Diagnosis Date  . Aortic aneurysm ([place])   . Aortic stenosis   . CAD (coronary artery disease)   . Diabetes mellitus type II, controlled ([place])   . Heart disease    Ablation  . History of chicken pox   . Hyperlipidemia   . Hypertension   . S/P CABG x 4    2001 - [place]  . 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 Umbilical hernia     Past Surgical History:  Procedure Laterality Date  . ABLATION     Rhythm problem; rhythm unknown; [place]  . CARDIOVERSION N/A 07/14/2018   Procedure: CARDIOVERSION;  Surgeon: Dorothy Spark, MD;  Location: [facility] ENDOSCOPY;  Service: Cardiovascular;  Laterality: N/A;  . CARDIOVERSION N/A  08/27/2018   Procedure: CARDIOVERSION;  Surgeon: Jerline Pain, MD;  Location: [facility] ENDOSCOPY;  Service: Cardiovascular;  Laterality: N/A;  . CORONARY ARTERY BYPASS GRAFT  2001  . EXPLORATION POST OPERATIVE OPEN HEART  2001, June  . INTRAOPERATIVE TRANSTHORACIC ECHOCARDIOGRAM N/A 04/01/2018   Procedure: INTRAOPERATIVE TRANSTHORACIC ECHOCARDIOGRAM;  Surgeon: Burnell Blanks, MD;  Location: [facility];  Service: Open Heart Surgery;  Laterality: N/A;  . RIGHT/LEFT HEART CATH AND CORONARY/GRAFT ANGIOGRAPHY N/A 01/31/2018   Procedure: RIGHT/LEFT HEART CATH AND CORONARY/GRAFT ANGIOGRAPHY;  Surgeon: Burnell Blanks, MD;  Location: [facility] CV LAB;  Service: Cardiovascular;  Laterality: N/A;  . TEE WITHOUT CARDIOVERSION N/A 01/17/2018   Procedure: TRANSESOPHAGEAL ECHOCARDIOGRAM (TEE);  Surgeon: Lelon Perla, MD;  Location: [facility] ENDOSCOPY;  Service: Cardiovascular;  Laterality: N/A;  . TEE WITHOUT CARDIOVERSION N/A 07/14/2018   Procedure: TRANSESOPHAGEAL ECHOCARDIOGRAM (TEE);  Surgeon: Dorothy Spark, MD;  Location[facility] ENDOSCOPY;  Service: Cardiovascular;  Laterality: N/A;  . TONSILLECTOMY    . TRANSCATHETER AORTIC VALVE REPLACEMENT, TRANSFEMORAL N/A 04/01/2018   Procedure: TRANSCATHETER AORTIC VALVE REPLACEMENT, TRANSFEMORAL;  Surgeon: Burnell Blanks, MD;  Location: [facility];  Service: Open Heart Surgery;  Laterality: N/A;  . UMBILICAL HERNIA REPAIR  2015    Social History   Socioeconomic History  . Marital status: Married    Spouse name: Not on file  . Number of children: 2  . Years of education: Not on file  . Highest education level: Not on file  Occupational History  . Not on file  Social Needs  . Financial resource strain: Not on file  . Food insecurity:    Worry: Not on file    Inability: Not on file  . Transportation needs:    Medical: Not on file    Non-medical: Not on file  Tobacco Use  . Smoking status: Former Smoker    Packs/day: 1.00    Years: 3.00     Pack years: 3.00  . Smokeless tobacco: Never Used  Substance and Sexual Activity  . Alcohol use: No    Alcohol/week: 0.0 standard drinks  . Drug use: No  . Sexual activity: Not on file  Lifestyle  . Physical activity:    Days per week: Not on file    Minutes per session: Not on file  . Stress: Not on file  Relationships  . Social connections:    Talks on phone: Not on file    Gets together: Not on file    Attends religious service: Not on file    Active member of club or organization: Not on file    Attends meetings of clubs or organizations: Not on file    Relationship status: Not on file  . Intimate partner violence:    Fear of current or ex partner: Not on file    Emotionally abused: Not on file    Physically abused: Not on file    Forced sexual activity: Not on file  Other Topics Concern  . Not on file  Social History  Narrative  . Not on file    Family History  Problem Relation Age of Onset  . Diabetes Mother 74       Deceased  . Heart disease Father 33       Deceased  . Heart disease Maternal Uncle   . Diabetes Maternal Uncle   . Heart disease Brother   . Diabetes Brother   . Diabetes Sister        #1  . Heart disease Sister        #2  . Hyperlipidemia Son        x2    ROS: Some weakness and diminished appetite but no fevers or chills, productive cough, hemoptysis, dysphasia, odynophagia, melena, hematochezia, dysuria, hematuria, rash, seizure activity, orthopnea, PND, pedal edema, claudication. Remaining systems are negative.  Physical Exam: Well-developed well-nourished in no acute distress.  Skin is warm and dry.  HEENT is normal.  Neck is supple.  Chest is clear to auscultation with normal expansion.  Cardiovascular exam is regular rate and rhythm. 2/6 systolic murmur Abdominal exam nontender or distended. No masses palpated. Extremities show no edema. neuro grossly intact  ECG-sinus bradycardia at a rate of 54, first-degree AV block, nonspecific  ST changes.  Personally reviewed  A/P  1 paroxysmal atrial fibrillation-patient remains in sinus rhythm on examination.  Plan to continue present dose of amiodarone.  Check chest x-ray, TSH and liver functions.  Continue apixaban.  Check hemoglobin and renal function.  2 history of aortic valve replacement-plan to continue SBE prophylaxis.  3 abdominal aortic aneurysm-we will arrange follow-up ultrasound.  4 coronary artery disease status post coronary artery bypass graft-patient denies chest pain.  Continue medical therapy.  Continue statin.  He is not on aspirin given need for apixaban.  5 hypertension-patient's blood pressure is controlled.  Continue present medications and follow.  6 hyperlipidemia-continue statin.  Kirk Ruths, MD

## 2019-01-09 MED FILL — AMOXICILLIN 500 MG CAPSULE: 500 | 2 days supply | Qty: 8 | Fill #1

## 2019-01-12 ENCOUNTER — Encounter: Payer: Self-pay | Admitting: Family Medicine

## 2019-01-14 ENCOUNTER — Ambulatory Visit (HOSPITAL_BASED_OUTPATIENT_CLINIC_OR_DEPARTMENT_OTHER)
Admission: RE | Admit: 2019-01-14 | Discharge: 2019-01-14 | Disposition: A | Discharge status: Home / Self Care | Payer: Medicare Other | Source: Ambulatory Visit | Attending: Cardiology | Admitting: Cardiology

## 2019-01-14 ENCOUNTER — Ambulatory Visit (INDEPENDENT_AMBULATORY_CARE_PROVIDER_SITE_OTHER): Payer: Medicare Other | Admitting: Cardiology

## 2019-01-14 ENCOUNTER — Encounter: Payer: Self-pay | Admitting: Cardiology

## 2019-01-14 VITALS — BP 134/68 | HR 54 | Ht 68.5 in | Wt 174.0 lb

## 2019-01-14 DIAGNOSIS — I2510 Atherosclerotic heart disease of native coronary artery without angina pectoris: Secondary | ICD-10-CM

## 2019-01-14 DIAGNOSIS — E7800 Pure hypercholesterolemia, unspecified: Secondary | ICD-10-CM

## 2019-01-14 DIAGNOSIS — R05 Cough: Secondary | ICD-10-CM | POA: Diagnosis not present

## 2019-01-14 DIAGNOSIS — I714 Abdominal aortic aneurysm, without rupture, unspecified: Secondary | ICD-10-CM

## 2019-01-14 DIAGNOSIS — I480 Paroxysmal atrial fibrillation: Secondary | ICD-10-CM | POA: Insufficient documentation

## 2019-01-14 NOTE — Patient Instructions (Signed)
Medication Instructions:   NO CHANGE  Labwork:  Your physician recommends that you HAVE LAB WORK TODAY  Testing/Procedures:  Your physician has requested that you have an abdominal aorta duplex. During this test, an ultrasound is used to evaluate the aorta. Allow 30 minutes for this exam. Do not eat after midnight the day before and avoid carbonated beverages [place] [facility]  A chest x-ray takes a picture of the organs and structures inside the chest, including the heart, lungs, and blood vessels. This test can show several things, including, whether the heart is enlarges; whether fluid is building up in the lungs; and whether pacemaker / defibrillator leads are still in place.   Follow-Up:  Your physician wants you to follow-up in: [place] will receive a reminder letter in the mail two months in advance. If you don't receive a letter, please call our office to schedule the follow-up appointment.  CALL IN June TO SCHEDULE APPOINTMENT IN [place]

## 2019-01-15 LAB — COMPREHENSIVE METABOLIC PANEL
ALK PHOS: 74 IU/L (ref 39–117)
ALT: 37 IU/L (ref 0–44)
AST: 23 IU/L (ref 0–40)
Albumin/Globulin Ratio: 1.5 (ref 1.2–2.2)
Albumin: 4 g/dL (ref 3.6–4.6)
BILIRUBIN TOTAL: 0.4 mg/dL (ref 0.0–1.2)
BUN/Creatinine Ratio: 20 (ref 10–24)
BUN: 27 mg/dL (ref 8–27)
CO2: 26 mmol/L (ref 20–29)
Calcium: 9.7 mg/dL (ref 8.6–10.2)
Chloride: 97 mmol/L (ref 96–106)
Creatinine, Ser: 1.37 mg/dL — ABNORMAL HIGH (ref 0.76–1.27)
GFR calc non Af Amer: 47 mL/min/{1.73_m2} — ABNORMAL LOW (ref >59)
GFR, EST AFRICAN AMERICAN: 54 mL/min/{1.73_m2} — AB (ref >59)
GLUCOSE: 318 mg/dL — AB (ref 65–99)
Globulin, Total: 2.7 g/dL (ref 1.5–4.5)
Potassium: 5.1 mmol/L (ref 3.5–5.2)
Sodium: 139 mmol/L (ref 134–144)
TOTAL PROTEIN: 6.7 g/dL (ref 6.0–8.5)

## 2019-01-15 LAB — CBC
HEMATOCRIT: 43.1 % (ref 37.5–51.0)
HEMOGLOBIN: 14.3 g/dL (ref 13.0–17.7)
MCH: 32.9 pg (ref 26.6–33.0)
MCHC: 33.2 g/dL (ref 31.5–35.7)
MCV: 99 fL — ABNORMAL HIGH (ref 79–97)
Platelets: 176 10*3/uL (ref 150–450)
RBC: 4.35 x10E6/uL (ref 4.14–5.80)
RDW: 11.8 % (ref 11.6–15.4)
WBC: 7.5 10*3/uL (ref 3.4–10.8)

## 2019-01-15 LAB — TSH: TSH: 2.84 u[IU]/mL (ref 0.450–4.500)

## 2019-01-19 MED FILL — glyBURIDE 5 MG TABS: 5 | 90 days supply | Qty: 90 | Fill #1

## 2019-01-19 MED FILL — metFORMIN HCL 500 MG TABS: 500 | 90 days supply | Qty: 270 | Fill #1

## 2019-01-21 ENCOUNTER — Other Ambulatory Visit (HOSPITAL_BASED_OUTPATIENT_CLINIC_OR_DEPARTMENT_OTHER): Payer: Medicare Other

## 2019-01-21 DIAGNOSIS — Z86008 Personal history of in-situ neoplasm of other site: Secondary | ICD-10-CM | POA: Diagnosis not present

## 2019-01-21 DIAGNOSIS — L570 Actinic keratosis: Secondary | ICD-10-CM | POA: Diagnosis not present

## 2019-01-21 DIAGNOSIS — X32XXXS Exposure to sunlight, sequela: Secondary | ICD-10-CM | POA: Diagnosis not present

## 2019-01-21 DIAGNOSIS — L814 Other melanin hyperpigmentation: Secondary | ICD-10-CM | POA: Diagnosis not present

## 2019-01-26 ENCOUNTER — Ambulatory Visit (HOSPITAL_BASED_OUTPATIENT_CLINIC_OR_DEPARTMENT_OTHER)
Admission: RE | Admit: 2019-01-26 | Discharge: 2019-01-26 | Disposition: A | Discharge status: Home / Self Care | Payer: Medicare Other | Source: Ambulatory Visit | Attending: Cardiology | Admitting: Cardiology

## 2019-01-26 DIAGNOSIS — I714 Abdominal aortic aneurysm, without rupture, unspecified: Secondary | ICD-10-CM

## 2019-01-26 NOTE — Progress Notes (Addendum)
Abdominal Aortic Duplex   Summary: Abdominal Aorta: No evidence of an abdominal aortic aneurysm was visualized. Dimension is similar to CTA in March 2019  Per Dr  Sinclair Grooms T Peter Scott 01/26/2019, 9:19 AM

## 2019-01-27 ENCOUNTER — Other Ambulatory Visit: Payer: Self-pay | Admitting: *Deleted

## 2019-01-27 DIAGNOSIS — I714 Abdominal aortic aneurysm, without rupture, unspecified: Secondary | ICD-10-CM

## 2019-02-01 ENCOUNTER — Encounter: Payer: Self-pay | Admitting: Family Medicine

## 2019-02-01 DIAGNOSIS — I714 Abdominal aortic aneurysm, without rupture, unspecified: Secondary | ICD-10-CM

## 2019-03-04 ENCOUNTER — Telehealth: Payer: Self-pay | Admitting: Physician Assistant

## 2019-03-04 ENCOUNTER — Encounter: Payer: Self-pay | Admitting: Family Medicine

## 2019-03-04 ENCOUNTER — Other Ambulatory Visit: Payer: Self-pay | Admitting: Physician Assistant

## 2019-03-04 DIAGNOSIS — Z952 Presence of prosthetic heart valve: Secondary | ICD-10-CM

## 2019-03-04 NOTE — Telephone Encounter (Signed)
Reviewed consent with patient over the phone  [place] REVIEW  I hereby voluntarily request, consent and authorize [facility] and its employed or contracted physicians, physician assistants, nurse practitioners or other licensed health care professionals (the Practitioner), to provide me with telemedicine health care services (the "Services") as deemed necessary by the treating Practitioner. I acknowledge and consent to receive the Services by the Practitioner via telemedicine. I understand that the telemedicine visit will involve communicating with the Practitioner through live audiovisual communication technology and the disclosure of certain medical information by electronic transmission. I acknowledge that I have been given the opportunity to request an in-person assessment or other available alternative prior to the telemedicine visit and am voluntarily participating in the telemedicine visit.  I understand that I have the right to withhold or withdraw my consent to the use of telemedicine in the course of my care at any time, without affecting my right to future care or treatment, and that the Practitioner or I may terminate the telemedicine visit at any time. I understand that I have the right to inspect all information obtained and/or recorded in the course of the telemedicine visit and may receive copies of available information for a reasonable fee.  I understand that some of the potential risks of receiving the Services via telemedicine include:  Marland Kitchen Delay or interruption in medical evaluation due to technological equipment failure or disruption; . Information transmitted may not be sufficient (e.g. poor resolution of images) to allow for appropriate medical decision making by the Practitioner; and/or  . In rare instances, security protocols could fail, causing a breach of personal health information.  Furthermore, I acknowledge that it is my responsibility to  provide information about my medical history, conditions and care that is complete and accurate to the best of my ability. I acknowledge that Practitioner's advice, recommendations, and/or decision may be based on factors not within their control, such as incomplete or inaccurate data provided by me or distortions of diagnostic images or specimens that may result from electronic transmissions. I understand that the practice of medicine is not an exact science and that Practitioner makes no warranties or guarantees regarding treatment outcomes. I acknowledge that I will receive a copy of this consent concurrently upon execution via email to the email address I last provided but may also request a printed copy by calling the office of [facility].    I understand that my insurance will be billed for this visit.   I have read or had this consent read to me. . I understand the contents of this consent, which adequately explains the benefits and risks of the Services being provided via telemedicine.  . I have been provided ample opportunity to ask questions regarding this consent and the Services and have had my questions answered to my satisfaction. . I give my informed consent for the services to be provided through the use of telemedicine in my medical care  By participating in this telemedicine visit I agree to the above.

## 2019-03-05 MED ORDER — GLUCOSE BLOOD VI STRP: ORAL_STRIP | 11 refills | Status: DC

## 2019-03-08 NOTE — Progress Notes (Signed)
[facility]   MULTIDISCIPLINARY HEART VALVE TEAM     Virtual Visit via Video Note   This visit type was conducted due to national recommendations for restrictions regarding the COVID-19 Pandemic (e.g. social distancing) in an effort to limit this patient's exposure and mitigate transmission in our community.  Due to his co-morbid illnesses, this patient is at least at moderate risk for complications without adequate follow up.  This format is felt to be most appropriate for this patient at this time.  All issues noted in this document were discussed and addressed.  A limited physical exam was performed with this format.  Please refer to the patient's chart for his consent to telehealth for [facility].   Evaluation Performed:  Follow-up visit  Date:  03/09/2019   ID:  Peter Scott, DOB November 13, 1932, MRN [id number]  Patient Location: Home Provider Location: Home  PCP:  Copland, Gay Filler, MD  Cardiologist:  Dr. [name] /  Dr. Angelena Form & Dr. Roxy Manns (TAVR ) Electrophysiologist:  None   Chief Complaint:  1 year s/p TAVR  History of Present Illness:    Peter Scott is a 85 y.o. male with a history of CAD s/p CABG (2001), SVT s/p ablation, HTN, HLD, DMT2, and severe AS s/p TAVR ( 04/01/18) who presents for a telehealth visit.   The patient does not have symptoms concerning for COVID-19 infection (fever, chills, cough, or new shortness of breath).   He developed a heart murmur and was diagnosed with aortic stenosiswhich had slowly progressed in severity on follow-up echocardiograms. He had a gradual worsening of fatigue and decreased exercise tolerance with exertional SOB.Follow-up echocardiogram revealed preserved left ventricular systolic function with progression and severity of aortic stenosis. Pre TAVR catheterization revealed severe native three-vessel coronary artery disease with continued patency of previous bypass grafts.  He underwent successful TAVR with a 26 mm  Edwards Sapien THV via the TF approach on 04/01/18. Post operative echo showed EF 60%, normally functioning TAVR valve with no PVL; mean gradient 13 mmHg. He was discharged on ASA and plavix. 1 month echo showed EF mildly decreased (45-50%) with normally functioning TAVR valve; no PVL and mean gradient 10 mm Hg.  He later developed atrial fibrillation and was started on Eliquis and plavix was discontinued. He underwent TEE guided cardioversion on August 26. Ejection fraction 45 to 50% with well-seated prosthetic aortic valve, mild mitral regurgitation and severe left atrial enlargement.However when he followed up atrial fibrillation had recurred. Amiodarone initiated.Had repeat successful cardioversion on August 27, 2018.   Today he presents for 1 year follow up. 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 He does get some mild shortness of breath with lifting heavy things. No chest pain. No CP or SOB. No LE edema, orthopnea or PND. He occasionally gets dizziness but no syncope. No blood in stool or urine. No palpitations.    Past Medical History:  Diagnosis Date  . Aortic aneurysm ([place])   . Aortic stenosis   . CAD (coronary artery disease)   . Diabetes mellitus type II, controlled ([place])   . Heart disease    Ablation  . History of chicken pox   . Hyperlipidemia   . Hypertension   . S/P CABG x 4    2001 - [place]  . 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 Umbilical hernia    Past  Surgical History:  Procedure Laterality Date  . ABLATION     Rhythm problem; rhythm unknown; [place]  . CARDIOVERSION N/A 07/14/2018   Procedure: CARDIOVERSION;  Surgeon: Dorothy Spark, MD;  Location: [facility] ENDOSCOPY;  Service: Cardiovascular;  Laterality: N/A;  . CARDIOVERSION N/A 08/27/2018   Procedure: CARDIOVERSION;  Surgeon: Jerline Pain, MD;  Location: [facility]  ENDOSCOPY;  Service: Cardiovascular;  Laterality: N/A;  . CORONARY ARTERY BYPASS GRAFT  2001  . EXPLORATION POST OPERATIVE OPEN HEART  2001, June  . INTRAOPERATIVE TRANSTHORACIC ECHOCARDIOGRAM N/A 04/01/2018   Procedure: INTRAOPERATIVE TRANSTHORACIC ECHOCARDIOGRAM;  Surgeon: Burnell Blanks, MD;  Location: [place];  Service: Open Heart Surgery;  Laterality: N/A;  . RIGHT/LEFT HEART CATH AND CORONARY/GRAFT ANGIOGRAPHY N/A 01/31/2018   Procedure: RIGHT/LEFT HEART CATH AND CORONARY/GRAFT ANGIOGRAPHY;  Surgeon: Burnell Blanks, MD;  Location: [place] CV LAB;  Service: Cardiovascular;  Laterality: N/A;  . TEE WITHOUT CARDIOVERSION N/A 01/17/2018   Procedure: TRANSESOPHAGEAL ECHOCARDIOGRAM (TEE);  Surgeon: Lelon Perla, MD;  Location: [facility] ENDOSCOPY;  Service: Cardiovascular;  Laterality: N/A;  . TEE WITHOUT CARDIOVERSION N/A 07/14/2018   Procedure: TRANSESOPHAGEAL ECHOCARDIOGRAM (TEE);  Surgeon: Dorothy Spark, MD;  Location: [facility] ENDOSCOPY;  Service: Cardiovascular;  Laterality: N/A;  . TONSILLECTOMY    . TRANSCATHETER AORTIC VALVE REPLACEMENT, TRANSFEMORAL N/A 04/01/2018   Procedure: TRANSCATHETER AORTIC VALVE REPLACEMENT, TRANSFEMORAL;  Surgeon: Burnell Blanks, MD;  Location: [place];  Service: Open Heart Surgery;  Laterality: N/A;  . UMBILICAL HERNIA REPAIR  2015     Current Meds  Medication Sig  . acetaminophen (TYLENOL) 500 MG tablet Take 1 tablet (500 mg total) by mouth every 6 (six) hours as needed. (Patient taking differently: Take 500 mg by mouth every 6 (six) hours as needed for moderate pain or headache. )  . amiodarone (PACERONE) 200 MG tablet Take 1 tablet (200 mg total) by mouth daily.  Marland Kitchen amLODipine (NORVASC) 5 MG tablet Take 1 tablet (5 mg total) by mouth daily.  Marland Kitchen amoxicillin (AMOXIL) 500 MG tablet Take 4 tablets (2,000 mg total) by mouth as directed. Take 1 hour prior to dental visits. (Patient taking differently: Take 2,000 mg by mouth See admin  instructions. Take 2000 mg by mouth 1 hour prior to dental visits.)  . apixaban (ELIQUIS) 5 MG TABS tablet Take 1 tablet (5 mg total) by mouth 2 (two) times daily.  Marland Kitchen atorvastatin (LIPITOR) 80 MG tablet Take 0.5 tablets (40 mg total) by mouth at bedtime.  Marland Kitchen glucose blood (BAYER CONTOUR TEST) test strip TEST ONCE DAILY FOR DIABETES Dx Code: E11.9  . glyBURIDE (DIABETA) 5 MG tablet Take 0.5 tablets (2.5 mg total) by mouth 2 (two) times daily with a meal.  . losartan (COZAAR) 100 MG tablet Take 0.5 tablets (50 mg total) by mouth 2 (two) times daily.  . metFORMIN (GLUCOPHAGE) 500 MG tablet Take 1-2 tablets (500-1,000 mg total) by mouth See admin instructions. Take 1000 mg by mouth in the morning and take 500 mg by mouth in the evening  . Sodium Fluoride (PREVIDENT 5000 BOOSTER PLUS) 1.1 % PSTE Place 1 application onto teeth at bedtime.   Marland Kitchen spironolactone (ALDACTONE) 25 MG tablet Take 0.5 tablets (12.5 mg total) by mouth daily. (Patient taking differently: Take 12.5 mg by mouth at bedtime. )  . [DISCONTINUED] amLODipine (NORVASC) 10 MG tablet Take 0.5 tablets (5 mg total) by mouth daily.     Allergies:   Glipizide   Social History   Tobacco Use  .  Smoking status: Former Smoker    Packs/day: 1.00    Years: 3.00    Pack years: 3.00  . Smokeless tobacco: Never Used  Substance Use Topics  . Alcohol use: No    Alcohol/week: 0.0 standard drinks  . Drug use: No     Family Hx: The patient's family history includes Diabetes in his brother, maternal uncle, and sister; Diabetes (age of onset: 67) in his mother; Heart disease in his brother, maternal uncle, and sister; Heart disease (age of onset: 21) in his father; Hyperlipidemia in his son.  ROS:   Please see the history of present illness.    All other systems reviewed and are negative.   Prior CV studies:   The following studies were reviewed today:  TAVR OPERATIVE NOTE  Date of Procedure:04/01/2018  Preoperative  Diagnosis:Severe Aortic Stenosis   Postoperative Diagnosis:Same   Procedure:   Transcatheter Aortic Valve Replacement - PercutaneousPercutaneous RightTransfemoral Approach Edwards Sapien 3 THV (size 38mm, model # 9600TFX, serial # [id number])  Co-Surgeons:Christopher Angelena Form, MD andClarence H. Roxy Manns, MD   Pre-operative Echo Findings: ? Severe aortic stenosis ? Normalleft ventricular systolic function  Post-operative Echo Findings: ? Noparavalvular leak ? Normalleft ventricular systolic function   ____________________   Post operative echo 04/02/18 Study Conclusions - Left ventricle: The cavity size was normal. Systolic function was normal. The estimated ejection fraction was in the range of 60% to 65%. Wall motion was normal; there were no regional wall motion abnormalities. Features are consistent with a pseudonormal left ventricular filling pattern, with concomitant abnormal relaxation and increased filling pressure (grade 2 diastolic dysfunction). Doppler parameters are consistent with high ventricular filling pressure. - Aortic valve: S/P Edwards Sapiein 57mm TAVR with well seated valve and normal function. There is mild perivalvular AR. Mean gradient (S): 13 mm Hg. Valve area (VTI): 2.62 cm^2. Valve area (Vmax): 2.63 cm^2. Valve area (Vmean): 2.76 cm^2. - Mitral valve: There was trivial regurgitation. Valve area by pressure half-time: 2.44 cm^2. - Left atrium: The atrium was mildly dilated. - Tricuspid valve: There was mild regurgitation. - Pulmonary arteries: PA peak pressure: 35 mm Hg (S). Impressions: - Compared to prior echo a TAVR is now present.  ____________________  2D ECHO 05/08/18 (30 days post op) Study Conclusions - Left ventricle: The cavity size was normal. Systolic function was mildly reduced. The estimated ejection fraction was in the range of 45% to 50%.  Diffuse hypokinesis. Features are consistent with a pseudonormal left ventricular filling pattern, with concomitant abnormal relaxation and increased filling pressure (grade 2 diastolic dysfunction). - Aortic valve: A TAVR 65mm Edwards Sapien bioprosthesis was present. There was trivial perivalvular regurgitation, improved. Peak velocity (S): 208 cm/s. Mean gradient (S): 10 mm Hg. - Mitral valve: Calcified annulus. There was mild regurgitation. - Left atrium: The atrium was severely dilated. Volume/bsa, ES, (1-plane Simpson&'s, A2C): 54.4 ml/m^2. - Tricuspid valve: There was moderate regurgitation directed eccentrically. - Pulmonary arteries: Systolic pressure was mildly increased. PA peak pressure: 35 mm Hg (S).  Labs/Other Tests and Data Reviewed:    EKG:  No ECG reviewed.  Recent Labs: 03/26/2018: B Natriuretic Peptide 101.4 04/02/2018: Magnesium 1.9 01/14/2019: ALT 37; BUN 27; Creatinine, Ser 1.37; Hemoglobin 14.3; Platelets 176; Potassium 5.1; Sodium 139; TSH 2.840   Recent Lipid Panel Lab Results  Component Value Date/Time   CHOL 139 09/29/2018 10:41 AM   TRIG 74.0 09/29/2018 10:41 AM   HDL 40.20 09/29/2018 10:41 AM   CHOLHDL 3 09/29/2018 10:41 AM   LDLCALC 84 09/29/2018  10:41 AM    Wt Readings from Last 3 Encounters:  03/09/19 172 lb (78 kg)  01/14/19 174 lb (78.9 kg)  10/08/18 174 lb (78.9 kg)     Objective:    Vital Signs:  BP (!) 167/63   Pulse (!) 48   Wt 172 lb (78 kg)   BMI 25.77 kg/m    Well nourished, well developed male in no  acute distress. Lower extremities with chronic venous stasis changes but no LE edema.   ASSESSMENT & PLAN:    Severe AS s/p TAVR: doing well with NHYA class II symptoms. 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 KCCQ completed over the phone. 1 year Echo pushed out until August given Covid 19 pandemic. He has amoxiciilin for SBE prophylaxis.   HTN: Bp is  elevated on home cuff. He is currently on Norvasc 5mg  daily (but was accidentally taking 1/2 tab), Losartan 50mg  BID, sprio 12.5mg  daily. Plan to increase Norvasc to a whole tablet 5mg  daily. I have asked him to follow BP at home and let us know if consistently running over 140/90 and we can increase Norvasc to 10mg  daily.   PAF: no palpitations. Sounds like he is maintaining sinus since last DCCV. Continue amiodarone 200mg  daily and Eliquis 5mg  BID. HR in high 40s but no dizziness, weakness or fatigue. Will continue to follow this.   CAD: stable. Continue medical therapy. No aspirin given need for Eliquis  COVID-19 Education: the signs and symptoms of COVID-19 were discussed with the patient and how to seek care for testing (follow up with PCP or arrange E-visit).  The importance of social distancing was discussed today.  Time:   Today, I have spent 25 minutes with the patient with telehealth technology discussing the above problems.     Medication Adjustments/Labs and Tests Ordered: Current medicines are reviewed at length with the patient today.  Concerns regarding medicines are outlined above.   Tests Ordered: No orders of the defined types were placed in this encounter.   Medication Changes: Meds ordered this encounter  Medications  . amLODipine (NORVASC) 5 MG tablet    Sig: Take 1 tablet (5 mg total) by mouth daily.    Order Specific Question:   Supervising Provider    Answer:   Sherren Mocha [[id number]]    Disposition:  Follow up as previously scheduled with Dr. [name]  Signed, Angelena Form, PA-C  03/09/2019 10:33 AM    [place]

## 2019-03-09 ENCOUNTER — Telehealth (INDEPENDENT_AMBULATORY_CARE_PROVIDER_SITE_OTHER): Payer: Medicare Other | Admitting: Physician Assistant

## 2019-03-09 ENCOUNTER — Other Ambulatory Visit: Payer: Self-pay | Admitting: Physician Assistant

## 2019-03-09 ENCOUNTER — Other Ambulatory Visit: Payer: Self-pay

## 2019-03-09 ENCOUNTER — Telehealth: Payer: Self-pay | Admitting: Physician Assistant

## 2019-03-09 VITALS — BP 167/63 | HR 48 | Wt 172.0 lb

## 2019-03-09 DIAGNOSIS — Z952 Presence of prosthetic heart valve: Secondary | ICD-10-CM | POA: Diagnosis not present

## 2019-03-09 DIAGNOSIS — I2510 Atherosclerotic heart disease of native coronary artery without angina pectoris: Secondary | ICD-10-CM

## 2019-03-09 DIAGNOSIS — I10 Essential (primary) hypertension: Secondary | ICD-10-CM

## 2019-03-09 DIAGNOSIS — I480 Paroxysmal atrial fibrillation: Secondary | ICD-10-CM

## 2019-03-09 DIAGNOSIS — Z7189 Other specified counseling: Secondary | ICD-10-CM

## 2019-03-09 MED ORDER — AMLODIPINE BESYLATE 5 MG PO TABS: 5.0000 mg | ORAL_TABLET | Freq: Every day | ORAL | Status: DC

## 2019-03-09 NOTE — Telephone Encounter (Signed)
entered in error   

## 2019-03-09 NOTE — Patient Instructions (Addendum)
Hello Peter Scott,   It was so nice to talk to you on the audio/video chat today. I am so glad you are doing so well. I just wanted to send you a recap of our discussion.   Please continue taking antibiotics prior to any dental work including cleanings (amoxicillin). Please continue on all your same medications, except you will increase Norvasc (amlodipine) from 2.5mg  daily to 5mg  daily. Please keep an eye on your blood pressure. If it is consistently running over 140/90, we can increase amlodipine further to 10mg  daily ( 2 tablets). Also, your heart rate is on the low side. I think it is acceptable if you continue to feel well. Please let us know if heart rate continues to drop or you start to develop symptoms of dizziness, weakness, new or worsening fatigue.   Your 1 year echo has been rescheduled to 8/3 at [place]. Please continue regular follow up with you primary cardiologist.   All appointment details are listed in upcoming appointments in MyChart or attached to this letter in your "after visit summary."  Currently location for echo is still listed as [facility], but it should be updated to [facility] shortly. Please reach out to [place] if this does not occur.   Please call us with any questions or concerns you may have and please stay safe during these uncertain times.  Nell Range

## 2019-03-27 ENCOUNTER — Encounter: Payer: Self-pay | Admitting: Family Medicine

## 2019-03-27 ENCOUNTER — Encounter: Payer: Self-pay | Admitting: Internal Medicine

## 2019-03-27 ENCOUNTER — Ambulatory Visit (INDEPENDENT_AMBULATORY_CARE_PROVIDER_SITE_OTHER): Payer: Medicare Other | Admitting: Internal Medicine

## 2019-03-27 ENCOUNTER — Other Ambulatory Visit: Payer: Self-pay

## 2019-03-27 DIAGNOSIS — I2510 Atherosclerotic heart disease of native coronary artery without angina pectoris: Secondary | ICD-10-CM

## 2019-03-27 DIAGNOSIS — R05 Cough: Secondary | ICD-10-CM

## 2019-03-27 DIAGNOSIS — R053 Chronic cough: Secondary | ICD-10-CM

## 2019-03-27 MED ORDER — AZELASTINE HCL 0.1 % NA SOLN: 2.0000 | Freq: Two times a day (BID) | NASAL | 6 refills | Status: DC

## 2019-03-27 NOTE — Progress Notes (Signed)
Subjective:    Patient ID: Peter Scott, male    DOB: 1932-10-02, 85 y.o.   MRN: [id number]  DOS:  03/27/2019 Type of visit - description:Virtual Visit via Video Note  I connected with@ on 03/29/19 at  2:20 PM EDT by a video enabled telemedicine application and verified that I am speaking with the correct person using two identifiers.   THIS ENCOUNTER IS A VIRTUAL VISIT DUE TO COVID-19 - PATIENT WAS NOT SEEN IN THE OFFICE. PATIENT HAS CONSENTED TO VIRTUAL VISIT / TELEMEDICINE VISIT   Location of patient: home  Location of provider: office  I discussed the limitations of evaluation and management by telemedicine and the availability of in person appointments. The patient expressed understanding and agreed to proceed.  History of Present Illness: Acute: The patient reports a history of cough for many years. He was seen last year but Johnnette Gourd. PA,  with that complaint, was prescribed an antibiotic and shortly after they cough essentially resolved. Symptoms have resurfaced for the last 3 months: On and off cough, he brings up thick but clear sputum. No cough at night, more cough when he is standing.  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 They increased amlodipine to 10 mg due to slight increase in BP.   Review of Systems No fever chills Admits to some runny nose, itchy eyes and watery eyes, he thinks is due to inverted eyelash. Some nasal congestion. Denies chest pain.  Shortness of breath at baseline No edema No GERD symptoms No wheezing   Past Medical History:  Diagnosis Date  . Aortic aneurysm ([place])   . Aortic stenosis   . CAD (coronary artery disease)   . Diabetes mellitus type II, controlled ([place])   . Heart disease    Ablation  . History of chicken pox   . Hyperlipidemia   . Hypertension   . S/P CABG x 4    2001 - [place]  . S/P TAVR  (transcatheter aortic valve replacement) 04/01/2018   26 mm Edwards Sapien 3 transcatheter heart valve placed via percutaneous right transfemoral approach   . Umbilical hernia     Past Surgical History:  Procedure Laterality Date  . ABLATION     Rhythm problem; rhythm unknown; [place]  . CARDIOVERSION N/A 07/14/2018   Procedure: CARDIOVERSION;  Surgeon: Dorothy Spark, MD;  Location: [facility] ENDOSCOPY;  Service: Cardiovascular;  Laterality: N/A;  . CARDIOVERSION N/A 08/27/2018   Procedure: CARDIOVERSION;  Surgeon: Jerline Pain, MD;  Location: [facility] ENDOSCOPY;  Service: Cardiovascular;  Laterality: N/A;  . CORONARY ARTERY BYPASS GRAFT  2001  . EXPLORATION POST OPERATIVE OPEN HEART  2001, June  . INTRAOPERATIVE TRANSTHORACIC ECHOCARDIOGRAM N/A 04/01/2018   Procedure: INTRAOPERATIVE TRANSTHORACIC ECHOCARDIOGRAM;  Surgeon: Burnell Blanks, MD;  Location: [place];  Service: Open Heart Surgery;  Laterality: N/A;  . RIGHT/LEFT HEART CATH AND CORONARY/GRAFT ANGIOGRAPHY N/A 01/31/2018   Procedure: RIGHT/LEFT HEART CATH AND CORONARY/GRAFT ANGIOGRAPHY;  Surgeon: Burnell Blanks, MD;  Location: [place] CV LAB;  Service: Cardiovascular;  Laterality: N/A;  . TEE WITHOUT CARDIOVERSION N/A 01/17/2018   Procedure: TRANSESOPHAGEAL ECHOCARDIOGRAM (TEE);  Surgeon: Lelon Perla, MD;  Location: [facility] ENDOSCOPY;  Service: Cardiovascular;  Laterality: N/A;  . TEE WITHOUT CARDIOVERSION N/A 07/14/2018   Procedure: TRANSESOPHAGEAL ECHOCARDIOGRAM (TEE);  Surgeon: Dorothy Spark, MD;  Location: [place];  Service: Cardiovascular;  Laterality:  N/A;  . TONSILLECTOMY    . TRANSCATHETER AORTIC VALVE REPLACEMENT, TRANSFEMORAL N/A 04/01/2018   Procedure: TRANSCATHETER AORTIC VALVE REPLACEMENT, TRANSFEMORAL;  Surgeon: Burnell Blanks, MD;  Location: [place];  Service: Open Heart Surgery;  Laterality: N/A;  . UMBILICAL HERNIA REPAIR  2015    Social History   Socioeconomic History  . Marital  status: Married    Spouse name: Not on file  . Number of children: 2  . Years of education: Not on file  . Highest education level: Not on file  Occupational History  . Not on file  Social Needs  . Financial resource strain: Not on file  . Food insecurity:    Worry: Not on file    Inability: Not on file  . Transportation needs:    Medical: Not on file    Non-medical: Not on file  Tobacco Use  . Smoking status: Former Smoker    Packs/day: 1.00    Years: 3.00    Pack years: 3.00  . Smokeless tobacco: Never Used  . Tobacco comment: quit tobbaco in his 51s  Substance and Sexual Activity  . Alcohol use: No    Alcohol/week: 0.0 standard drinks  . Drug use: No  . Sexual activity: Not on file  Lifestyle  . Physical activity:    Days per week: Not on file    Minutes per session: Not on file  . Stress: Not on file  Relationships  . Social connections:    Talks on phone: Not on file    Gets together: Not on file    Attends religious service: Not on file    Active member of club or organization: Not on file    Attends meetings of clubs or organizations: Not on file    Relationship status: Not on file  . Intimate partner violence:    Fear of current or ex partner: Not on file    Emotionally abused: Not on file    Physically abused: Not on file    Forced sexual activity: Not on file  Other Topics Concern  . Not on file  Social History Narrative  . Not on file      Allergies as of 03/27/2019      Reactions   Glipizide Palpitations   "Heart racing" "Heart racing"      Medication List       Accurate as of Mar 27, 2019 11:59 PM. If you have any questions, ask your nurse or doctor.        acetaminophen 500 MG tablet Commonly known as:  TYLENOL Take 1 tablet (500 mg total) by mouth every 6 (six) hours as needed. What changed:  reasons to take this   amiodarone 200 MG tablet Commonly known as:  PACERONE Take 1 tablet (200 mg total) by mouth daily.   amLODipine 5 MG  tablet Commonly known as:  NORVASC Take 1 tablet (5 mg total) by mouth daily.   amoxicillin 500 MG tablet Commonly known as:  AMOXIL Take 4 tablets (2,000 mg total) by mouth as directed. Take 1 hour prior to dental visits.   apixaban 5 MG Tabs tablet Commonly known as:  ELIQUIS Take 1 tablet (5 mg total) by mouth 2 (two) times daily.   atorvastatin 80 MG tablet Commonly known as:  LIPITOR Take 0.5 tablets (40 mg total) by mouth at bedtime.   azelastine 0.1 % nasal spray Commonly known as:  ASTELIN Place 2 sprays into both nostrils 2 (two) times daily.  Started by:  Kathlene November, MD   glucose blood test strip Commonly known as:  Estate manager/land agent TEST ONCE DAILY FOR DIABETES Dx Code: E11.9   glyBURIDE 5 MG tablet Commonly known as:  DIABETA Take 0.5 tablets (2.5 mg total) by mouth 2 (two) times daily with a meal.   losartan 100 MG tablet Commonly known as:  COZAAR Take 0.5 tablets (50 mg total) by mouth 2 (two) times daily.   metFORMIN 500 MG tablet Commonly known as:  GLUCOPHAGE Take 1-2 tablets (500-1,000 mg total) by mouth See admin instructions. Take 1000 mg by mouth in the morning and take 500 mg by mouth in the evening   PreviDent 5000 Booster Plus 1.1 % Pste Generic drug:  Sodium Fluoride Place 1 application onto teeth at bedtime.   spironolactone 25 MG tablet Commonly known as:  ALDACTONE Take 0.5 tablets (12.5 mg total) by mouth daily. What changed:  when to take this           Objective:   Physical Exam There were no vitals taken for this visit. This is a virtual video visit, alert oriented x3, no apparent distress. BP today 137/70.  Temperature 98    Assessment     85 year old gentleman, PMH includes CAD, SVT status post ablation, HTN, high cholesterol, DM, status post TVAR 04/01/2018, evaluated today with:  Chronic cough: As described above, the patient is 55, taking an ARB, no recent fever or chills.  Cough started 3 months ago. 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 Last chest x-ray 2- 2020: Hypoinflation. Plan: Start Flonase, Astelin.  Reports that [facility] OTC help and that is okay.  Also Robitussin-DM. Refer to pulmonary for further evaluation. Although he is coughing, I doubt COVID-19.  Recommend to evaluate face-to-face if possible.. Summary of our conversation will be sent to the patient.   I discussed the assessment and treatment plan with the patient. The patient was provided an opportunity to ask questions and all were answered. The patient agreed with the plan and demonstrated an understanding of the instructions.   The patient was advised to call back or seek an in-person evaluation if the symptoms worsen or if the condition fails to improve as anticipated.

## 2019-04-06 ENCOUNTER — Other Ambulatory Visit: Payer: Self-pay | Admitting: Family Medicine

## 2019-04-06 MED FILL — metFORMIN HCL 500 MG TABS: 500 | 90 days supply | Qty: 270 | Fill #0

## 2019-05-12 NOTE — Progress Notes (Signed)
[place] [facility] [address] [place], [place] [address] [phone]  Date:  05/13/2019   Name:  Peter Scott   DOB:  06/29/1932   MRN:  [id number]  PCP:  [name], MD    Chief Complaint: No chief complaint on file.   History of Present Illness:  Peter Scott is a 85 y.o. very pleasant male patient who presents with the following:  Virtual visit today due to pandemic Patient location is home, provider location is office Patient identity confirmed with 2 factors, he gives consent for virtual visit today  Peter Scott is a nice gentleman with history of heart disease status post CABG in 2001, aortic stenosis status post TAVR 5/19, diabetes, hyperlipidemia, A. Fib, AAA  He has some surgery on his eyelid just recently for an ?everted eyelash- this is doing better  Most recent cardiology visit with Dr. [name] in February: 1 paroxysmal atrial fibrillation-patient remains in sinus rhythm on examination.  Plan to continue present dose of amiodarone.  Check chest x-ray, TSH and liver functions.  Continue apixaban.  Check hemoglobin and renal function. 2 history of aortic valve replacement-plan to continue SBE prophylaxis. 3 abdominal aortic aneurysm-we will arrange follow-up ultrasound. 4 coronary artery disease status post coronary artery bypass graft-patient denies chest pain.  Continue medical therapy.  Continue statin.  He is not on aspirin given need for apixaban. 5 hypertension-patient's blood pressure is controlled.  Continue present medications and follow. 6 hyperlipidemia-continue statin.  He has an echo coming up in August to monitor his aortic valve He is "feeling great" although he does not have a lot of energy He notes that he does not have any CP or SOB, but he just gets tired easily and needs to rest  He is sleeping well at night, and enjoys a nap after lunch  Lab Results  Component Value Date   HGBA1C 7.7  (H) 09/29/2018   He is taking metformin and glyburide for diabetes, most recent A1c in November showed reasonable control for age metformin 1000 am, 500 pm Glyburide 2.5 BID Amiodarone Amlodipine Eliquis Losartan Lipitor Spiro 12.5 daily  He does check am glucose daily He tried to decrease his glyburide dose, but his glucose went up (but only to 130 or so fasting) However he also has noted hypoglycemia in the am - can get into the 50s, which will cause symptoms   He notes mild edema in his bilateral LE during the day- gets better overnight  It does not really bother him, he just noticed it  He is not going to the gym as it is closed, but he is working on his farm, 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  I reminded him to be careful in the heat this summer  No change in his weight- he does weigh most days in the am   Patient Active Problem List   Diagnosis Date Noted  . Paroxysmal atrial fibrillation (HCC)   . 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 S/P CABG x 4   . Severe aortic stenosis   . Medicare annual wellness visit, subsequent 10/04/2014  . Aortic stenosis 09/29/2014  . Bruit 09/29/2014  . S/P gastric bypass 07/23/2014  . Abdominal aortic aneurysm ([place]) 07/23/2014  . BCC (basal cell carcinoma) 07/23/2014  . SCC (squamous cell carcinoma), arm 07/23/2014  . Essential hypertension, benign 07/08/2014  . Coronary artery disease 07/08/2014  . Hyperlipidemia LDL  goal <100 07/08/2014  . Type II diabetes mellitus, well controlled ([place]) 07/08/2014  . Colon cancer screening 07/08/2014    Past Medical History:  Diagnosis Date  . Aortic aneurysm ([place])   . Aortic stenosis   . CAD (coronary artery disease)   . Diabetes mellitus type II, controlled ([place])   . Heart disease    Ablation  . History of chicken pox   . Hyperlipidemia   . Hypertension   . S/P CABG x 4    2001 - [place]  . 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   26  mm Edwards Sapien 3 transcatheter heart valve placed via percutaneous right transfemoral approach   . Umbilical hernia     Past Surgical History:  Procedure Laterality Date  . ABLATION     Rhythm problem; rhythm unknown; [place]  . CARDIOVERSION N/A 07/14/2018   Procedure: CARDIOVERSION;  Surgeon: Dorothy Spark, MD;  Location: [facility] ENDOSCOPY;  Service: Cardiovascular;  Laterality: N/A;  . CARDIOVERSION N/A 08/27/2018   Procedure: CARDIOVERSION;  Surgeon: Jerline Pain, MD;  Location[facility] ENDOSCOPY;  Service: Cardiovascular;  Laterality: N/A;  . CORONARY ARTERY BYPASS GRAFT  2001  . EXPLORATION POST OPERATIVE OPEN HEART  2001, June  . INTRAOPERATIVE TRANSTHORACIC ECHOCARDIOGRAM N/A 04/01/2018   Procedure: INTRAOPERATIVE TRANSTHORACIC ECHOCARDIOGRAM;  Surgeon: Burnell Blanks, MD;  Location: [place];  Service: Open Heart Surgery;  Laterality: N/A;  . RIGHT/LEFT HEART CATH AND CORONARY/GRAFT ANGIOGRAPHY N/A 01/31/2018   Procedure: RIGHT/LEFT HEART CATH AND CORONARY/GRAFT ANGIOGRAPHY;  Surgeon: Burnell Blanks, MD;  Location: [place] CV LAB;  Service: Cardiovascular;  Laterality: N/A;  . TEE WITHOUT CARDIOVERSION N/A 01/17/2018   Procedure: TRANSESOPHAGEAL ECHOCARDIOGRAM (TEE);  Surgeon: Lelon Perla, MD;  Location: [facility] ENDOSCOPY;  Service: Cardiovascular;  Laterality: N/A;  . TEE WITHOUT CARDIOVERSION N/A 07/14/2018   Procedure: TRANSESOPHAGEAL ECHOCARDIOGRAM (TEE);  Surgeon: Dorothy Spark, MD;  Location: [facility] ENDOSCOPY;  Service: Cardiovascular;  Laterality: N/A;  . TONSILLECTOMY    . TRANSCATHETER AORTIC VALVE REPLACEMENT, TRANSFEMORAL N/A 04/01/2018   Procedure: TRANSCATHETER AORTIC VALVE REPLACEMENT, TRANSFEMORAL;  Surgeon: Burnell Blanks, MD;  Location: [place];  Service: Open Heart Surgery;  Laterality: N/A;  . UMBILICAL HERNIA REPAIR  2015    Social History   Tobacco Use  . Smoking status: Former Smoker    Packs/day: 1.00    Years: 3.00    Pack  years: 3.00  . Smokeless tobacco: Never Used  . Tobacco comment: quit tobbaco in his 83s  Substance Use Topics  . Alcohol use: No    Alcohol/week: 0.0 standard drinks  . Drug use: No    Family History  Problem Relation Age of Onset  . Diabetes Mother 17       Deceased  . Heart disease Father 70       Deceased  . Heart disease Maternal Uncle   . Diabetes Maternal Uncle   . Heart disease Brother   . Diabetes Brother   . Diabetes Sister        #1  . Heart disease Sister        #2  . Hyperlipidemia Son        x2    Allergies  Allergen Reactions  . Glipizide Palpitations    "Heart racing" "Heart racing"    Medication list has been reviewed and updated.  Current Outpatient Medications on File Prior to Visit  Medication Sig Dispense Refill  . acetaminophen (TYLENOL) 500 MG tablet  Take 1 tablet (500 mg total) by mouth every 6 (six) hours as needed. (Patient taking differently: Take 500 mg by mouth every 6 (six) hours as needed for moderate pain or headache. ) 30 tablet 0  . amiodarone (PACERONE) 200 MG tablet Take 1 tablet (200 mg total) by mouth daily. 90 tablet 3  . amLODipine (NORVASC) 5 MG tablet Take 1 tablet (5 mg total) by mouth daily.    Marland Kitchen amoxicillin (AMOXIL) 500 MG tablet Take 4 tablets (2,000 mg total) by mouth as directed. Take 1 hour prior to dental visits. (Patient not taking: Reported on 03/27/2019) 8 tablet 6  . apixaban (ELIQUIS) 5 MG TABS tablet Take 1 tablet (5 mg total) by mouth 2 (two) times daily. 60 tablet 6  . atorvastatin (LIPITOR) 80 MG tablet Take 0.5 tablets (40 mg total) by mouth at bedtime. 90 tablet 1  . azelastine (ASTELIN) 0.1 % nasal spray Place 2 sprays into both nostrils 2 (two) times daily. 30 mL 6  . glucose blood (BAYER CONTOUR TEST) test strip TEST ONCE DAILY FOR DIABETES Dx Code: E11.9 100 each 11  . glyBURIDE (DIABETA) 5 MG tablet Take 0.5 tablets (2.5 mg total) by mouth 2 (two) times daily with a meal. 90 tablet 3  . losartan (COZAAR)  100 MG tablet Take 0.5 tablets (50 mg total) by mouth 2 (two) times daily. 90 tablet 1  . metFORMIN (GLUCOPHAGE) 500 MG tablet TAKE 2 TABLETS BY MOUTH EVERY MORNING THEN TAKE 1 TABLET BY MOUTH EVERY EVENING 270 tablet 1  . Sodium Fluoride (PREVIDENT 5000 BOOSTER PLUS) 1.1 % PSTE Place 1 application onto teeth at bedtime.     Marland Kitchen spironolactone (ALDACTONE) 25 MG tablet Take 0.5 tablets (12.5 mg total) by mouth daily. (Patient taking differently: Take 12.5 mg by mouth at bedtime. ) 45 tablet 1   No current facility-administered medications on file prior to visit.     Review of Systems:  As per HPI- otherwise negative.  No fever or chills  Physical Examination: There were no vitals filed for this visit. There were no vitals filed for this visit. There is no height or weight on file to calculate BMI. Ideal Body Weight:   BP 144/60 home check- highest he got was 152/63 He is checking home BP Observed over video- he looks well, his normal self No cough, wheezing or SOB, no distress    Assessment and Plan:   ICD-10-CM   1. Controlled type 2 diabetes mellitus without complication, without long-term current use of insulin (HCC)  E11.9 Hemoglobin J1O    Basic metabolic panel  2. Essential hypertension, benign  I10   3. Mixed hyperlipidemia  E78.2 Lipid panel   Virtual visit for follow-up today. Ramzy sees cardiology for his cardiac concerns. His diabetes has been under good control, but I am concerned that we are over treating him as he is experiencing hypoglycemia.  Explained that at his age low sugars are more dangerous than slightly higher sugars.  Glucose readings up to 150 and even 200s sometimes are acceptable.  He will stop using glyburide, and will let me know if he has any concerns.  We will bring him in for a lab visit soon Need to watch renal function with metformin  Follow-up: No follow-ups on file.  No orders of the defined types were placed in this encounter.  Orders  Placed This Encounter  Procedures  . Hemoglobin A1c  . Basic metabolic panel  . Lipid panel    @  AYOK@  Outpatient Encounter Medications as of 05/13/2019  Medication Sig  . acetaminophen (TYLENOL) 500 MG tablet Take 1 tablet (500 mg total) by mouth every 6 (six) hours as needed. (Patient taking differently: Take 500 mg by mouth every 6 (six) hours as needed for moderate pain or headache. )  . amiodarone (PACERONE) 200 MG tablet Take 1 tablet (200 mg total) by mouth daily.  Marland Kitchen amLODipine (NORVASC) 5 MG tablet Take 1 tablet (5 mg total) by mouth daily.  Marland Kitchen amoxicillin (AMOXIL) 500 MG tablet Take 4 tablets (2,000 mg total) by mouth as directed. Take 1 hour prior to dental visits. (Patient not taking: Reported on 03/27/2019)  . apixaban (ELIQUIS) 5 MG TABS tablet Take 1 tablet (5 mg total) by mouth 2 (two) times daily.  Marland Kitchen atorvastatin (LIPITOR) 80 MG tablet Take 0.5 tablets (40 mg total) by mouth at bedtime.  Marland Kitchen azelastine (ASTELIN) 0.1 % nasal spray Place 2 sprays into both nostrils 2 (two) times daily.  Marland Kitchen glucose blood (BAYER CONTOUR TEST) test strip TEST ONCE DAILY FOR DIABETES Dx Code: E11.9  . glyBURIDE (DIABETA) 5 MG tablet Take 0.5 tablets (2.5 mg total) by mouth 2 (two) times daily with a meal.  . losartan (COZAAR) 100 MG tablet Take 0.5 tablets (50 mg total) by mouth 2 (two) times daily.  . metFORMIN (GLUCOPHAGE) 500 MG tablet TAKE 2 TABLETS BY MOUTH EVERY MORNING THEN TAKE 1 TABLET BY MOUTH EVERY EVENING  . Sodium Fluoride (PREVIDENT 5000 BOOSTER PLUS) 1.1 % PSTE Place 1 application onto teeth at bedtime.   Marland Kitchen spironolactone (ALDACTONE) 25 MG tablet Take 0.5 tablets (12.5 mg total) by mouth daily. (Patient taking differently: Take 12.5 mg by mouth at bedtime. )   No facility-administered encounter medications on file as of 05/13/2019.      Signed Lamar Blinks, MD

## 2019-05-13 ENCOUNTER — Encounter: Payer: Self-pay | Admitting: Family Medicine

## 2019-05-13 ENCOUNTER — Encounter: Payer: Self-pay | Admitting: Thoracic Surgery (Cardiothoracic Vascular Surgery)

## 2019-05-13 ENCOUNTER — Other Ambulatory Visit: Payer: Self-pay

## 2019-05-13 ENCOUNTER — Ambulatory Visit (INDEPENDENT_AMBULATORY_CARE_PROVIDER_SITE_OTHER): Payer: Medicare Other | Admitting: Family Medicine

## 2019-05-13 DIAGNOSIS — I10 Essential (primary) hypertension: Secondary | ICD-10-CM | POA: Diagnosis not present

## 2019-05-13 DIAGNOSIS — I2510 Atherosclerotic heart disease of native coronary artery without angina pectoris: Secondary | ICD-10-CM | POA: Diagnosis not present

## 2019-05-13 DIAGNOSIS — E782 Mixed hyperlipidemia: Secondary | ICD-10-CM | POA: Diagnosis not present

## 2019-05-13 DIAGNOSIS — E119 Type 2 diabetes mellitus without complications: Secondary | ICD-10-CM

## 2019-05-25 ENCOUNTER — Encounter: Payer: Self-pay | Admitting: Family Medicine

## 2019-05-29 ENCOUNTER — Other Ambulatory Visit: Payer: Self-pay

## 2019-05-29 ENCOUNTER — Other Ambulatory Visit (INDEPENDENT_AMBULATORY_CARE_PROVIDER_SITE_OTHER): Payer: Medicare Other

## 2019-05-29 DIAGNOSIS — E782 Mixed hyperlipidemia: Secondary | ICD-10-CM

## 2019-05-29 DIAGNOSIS — E119 Type 2 diabetes mellitus without complications: Secondary | ICD-10-CM

## 2019-05-29 LAB — BASIC METABOLIC PANEL
BUN: 24 mg/dL — ABNORMAL HIGH (ref 6–23)
CO2: 30 mEq/L (ref 19–32)
Calcium: 9.1 mg/dL (ref 8.4–10.5)
Chloride: 99 mEq/L (ref 96–112)
Creatinine, Ser: 1.49 mg/dL (ref 0.40–1.50)
GFR: 44.79 mL/min — ABNORMAL LOW (ref >60.00)
Glucose, Bld: 195 mg/dL — ABNORMAL HIGH (ref 70–99)
Potassium: 4.8 mEq/L (ref 3.5–5.1)
Sodium: 137 mEq/L (ref 135–145)

## 2019-05-29 LAB — LIPID PANEL
Cholesterol: 137 mg/dL (ref 0–200)
HDL: 40.9 mg/dL (ref >39.00)
LDL Cholesterol: 78 mg/dL (ref 0–99)
NonHDL: 95.9
Total CHOL/HDL Ratio: 3
Triglycerides: 88 mg/dL (ref 0.0–149.0)
VLDL: 17.6 mg/dL (ref 0.0–40.0)

## 2019-05-29 LAB — HEMOGLOBIN A1C: Hgb A1c MFr Bld: 8.2 % — ABNORMAL HIGH (ref 4.6–6.5)

## 2019-05-30 ENCOUNTER — Encounter: Payer: Self-pay | Admitting: Family Medicine

## 2019-06-02 ENCOUNTER — Encounter: Payer: Self-pay | Admitting: Family Medicine

## 2019-06-02 ENCOUNTER — Other Ambulatory Visit: Payer: Self-pay | Admitting: Family Medicine

## 2019-06-02 MED ORDER — SITAGLIPTIN PHOSPHATE 25 MG PO TABS: 25.0000 mg | ORAL_TABLET | Freq: Every day | ORAL | 11 refills | Status: DC

## 2019-06-02 NOTE — Telephone Encounter (Signed)
-----   Message from Lelon Perla, MD sent at 06/01/2019  7:10 AM EDT ----- Should be ok from my standpoint; thx BC ----- Message ----- From: [name], MD Sent: 06/01/2019   6:05 AM EDT To: Lelon Perla, MD  Salley Scarlet Aaron Edelman, I hope that you are well!  I recently stopped Chanson's glyburide due to hypoglycemia, but his A1c came up with just metformin.  His renal function is also slipping so we may need to stop metformin eventually.  I was thinking of Januvia but it can contribute to heart failure risk.  Wanted to make sure you are ok with my starting it first  Thanks so much, Jess

## 2019-06-15 MED FILL — glyBURIDE 5 MG TABS: 5 | 90 days supply | Qty: 90 | Fill #2

## 2019-06-22 ENCOUNTER — Other Ambulatory Visit (HOSPITAL_COMMUNITY): Payer: Medicare Other

## 2019-06-22 ENCOUNTER — Other Ambulatory Visit (HOSPITAL_BASED_OUTPATIENT_CLINIC_OR_DEPARTMENT_OTHER): Payer: Medicare Other

## 2019-06-23 ENCOUNTER — Ambulatory Visit (HOSPITAL_BASED_OUTPATIENT_CLINIC_OR_DEPARTMENT_OTHER)
Admission: RE | Admit: 2019-06-23 | Discharge: 2019-06-23 | Disposition: A | Discharge status: Home / Self Care | Payer: Medicare Other | Source: Ambulatory Visit | Attending: Physician Assistant | Admitting: Physician Assistant

## 2019-06-23 ENCOUNTER — Other Ambulatory Visit: Payer: Self-pay

## 2019-06-23 DIAGNOSIS — Z952 Presence of prosthetic heart valve: Secondary | ICD-10-CM | POA: Insufficient documentation

## 2019-06-23 NOTE — Progress Notes (Signed)
  Echocardiogram 2D Echocardiogram has been performed.  Peter Scott 06/23/2019, 10:20 AM

## 2019-07-15 MED FILL — metFORMIN HCL 500 MG TABS: 500 | 90 days supply | Qty: 270 | Fill #1

## 2019-07-28 NOTE — Progress Notes (Signed)
HPI: FU CAD andAS. Patient is status post coronary artery bypass graft in [place] in 2001. He also had ablation of what sounds to be SVT at that time. Carotid Dopplers March 2019 showed 1 to 39% bilateral stenosis. Patient had preoperative cardiac catheterization that revealed severe native three-vessel coronary artery disease with patency of grafts. He underwent TAVR 5/19. Also with h/o PAF. Abdominal ultrasound March 2020 showed no aneurysm.  Echocardiogram August 2020 showed ejection fraction 45 to 50%, moderate diastolic dysfunction, mild left atrial enlargement, prior aortic valve replacement with trace aortic insufficiency and mean gradient 13 mmHg.  Since last seen,he has dyspnea with more vigorous activities.  No orthopnea or PND.  Occasional mild pedal edema.  No chest pain or syncope.  Current Outpatient Medications  Medication Sig Dispense Refill  . acetaminophen (TYLENOL) 500 MG tablet Take 1 tablet (500 mg total) by mouth every 6 (six) hours as needed. (Patient taking differently: Take 500 mg by mouth every 6 (six) hours as needed for moderate pain or headache. ) 30 tablet 0  . amiodarone (PACERONE) 200 MG tablet Take 1 tablet (200 mg total) by mouth daily. 90 tablet 3  . amLODipine (NORVASC) 5 MG tablet Take 1 tablet (5 mg total) by mouth daily.    Marland Kitchen amoxicillin (AMOXIL) 500 MG tablet Take 4 tablets (2,000 mg total) by mouth as directed. Take 1 hour prior to dental visits. 8 tablet 6  . apixaban (ELIQUIS) 5 MG TABS tablet Take 1 tablet (5 mg total) by mouth 2 (two) times daily. 60 tablet 6  . atorvastatin (LIPITOR) 80 MG tablet Take 0.5 tablets (40 mg total) by mouth at bedtime. 90 tablet 1  . azelastine (ASTELIN) 0.1 % nasal spray Place 2 sprays into both nostrils 2 (two) times daily. 30 mL 6  . glucose blood (BAYER CONTOUR TEST) test strip TEST ONCE DAILY FOR DIABETES Dx Code: E11.9 100 each 11  . glyBURIDE (DIABETA) 5 MG tablet Take 2.5 mg by mouth daily with breakfast.     . losartan (COZAAR) 100 MG tablet Take 0.5 tablets (50 mg total) by mouth 2 (two) times daily. 90 tablet 1  . metFORMIN (GLUCOPHAGE) 500 MG tablet TAKE 2 TABLETS BY MOUTH EVERY MORNING THEN TAKE 1 TABLET BY MOUTH EVERY EVENING 270 tablet 1  . Sodium Fluoride (PREVIDENT 5000 BOOSTER PLUS) 1.1 % PSTE Place 1 application onto teeth at bedtime.     Marland Kitchen spironolactone (ALDACTONE) 25 MG tablet Take 0.5 tablets (12.5 mg total) by mouth daily. (Patient taking differently: Take 12.5 mg by mouth at bedtime. ) 45 tablet 1   No current facility-administered medications for this visit.      Past Medical History:  Diagnosis Date  . Aortic aneurysm ([place])   . Aortic stenosis   . CAD (coronary artery disease)   . Diabetes mellitus type II, controlled ([place])   . Heart disease    Ablation  . History of chicken pox   . Hyperlipidemia   . Hypertension   . S/P CABG x 4    2001 - [place]  . 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 Umbilical hernia     Past Surgical History:  Procedure Laterality Date  . ABLATION     Rhythm problem; rhythm unknown; [place]  . CARDIOVERSION N/A 07/14/2018   Procedure: CARDIOVERSION;  Surgeon: Dorothy Spark, MD;  Location: [place];  Service: Cardiovascular;  Laterality: N/A;  . CARDIOVERSION N/A 08/27/2018   Procedure: CARDIOVERSION;  Surgeon: Jerline Pain, MD;  Location: [place] ENDOSCOPY;  Service: Cardiovascular;  Laterality: N/A;  . CORONARY ARTERY BYPASS GRAFT  2001  . EXPLORATION POST OPERATIVE OPEN HEART  2001, June  . INTRAOPERATIVE TRANSTHORACIC ECHOCARDIOGRAM N/A 04/01/2018   Procedure: INTRAOPERATIVE TRANSTHORACIC ECHOCARDIOGRAM;  Surgeon: Burnell Blanks, MD;  Location: [place];  Service: Open Heart Surgery;  Laterality: N/A;  . RIGHT/LEFT HEART CATH AND CORONARY/GRAFT ANGIOGRAPHY N/A 01/31/2018   Procedure: RIGHT/LEFT  HEART CATH AND CORONARY/GRAFT ANGIOGRAPHY;  Surgeon: Burnell Blanks, MD;  Location: [place] CV LAB;  Service: Cardiovascular;  Laterality: N/A;  . TEE WITHOUT CARDIOVERSION N/A 01/17/2018   Procedure: TRANSESOPHAGEAL ECHOCARDIOGRAM (TEE);  Surgeon: Lelon Perla, MD;  Location: [facility] ENDOSCOPY;  Service: Cardiovascular;  Laterality: N/A;  . TEE WITHOUT CARDIOVERSION N/A 07/14/2018   Procedure: TRANSESOPHAGEAL ECHOCARDIOGRAM (TEE);  Surgeon: Dorothy Spark, MD;  Location: [facility] ENDOSCOPY;  Service: Cardiovascular;  Laterality: N/A;  . TONSILLECTOMY    . TRANSCATHETER AORTIC VALVE REPLACEMENT, TRANSFEMORAL N/A 04/01/2018   Procedure: TRANSCATHETER AORTIC VALVE REPLACEMENT, TRANSFEMORAL;  Surgeon: Burnell Blanks, MD;  Location: [place];  Service: Open Heart Surgery;  Laterality: N/A;  . UMBILICAL HERNIA REPAIR  2015    Social History   Socioeconomic History  . Marital status: Married    Spouse name: Not on file  . Number of children: 2  . Years of education: Not on file  . Highest education level: Not on file  Occupational History  . Not on file  Social Needs  . Financial resource strain: Not on file  . Food insecurity    Worry: Not on file    Inability: Not on file  . Transportation needs    Medical: Not on file    Non-medical: Not on file  Tobacco Use  . Smoking status: Former Smoker    Packs/day: 1.00    Years: 3.00    Pack years: 3.00  . Smokeless tobacco: Never Used  . Tobacco comment: quit tobbaco in his 46s  Substance and Sexual Activity  . Alcohol use: No    Alcohol/week: 0.0 standard drinks  . Drug use: No  . Sexual activity: Not on file  Lifestyle  . Physical activity    Days per week: Not on file    Minutes per session: Not on file  . Stress: Not on file  Relationships  . Social Herbalist on phone: Not on file    Gets together: Not on file    Attends religious service: Not on file    Active member of club or organization: Not on  file    Attends meetings of clubs or organizations: Not on file    Relationship status: Not on file  . Intimate partner violence    Fear of current or ex partner: Not on file    Emotionally abused: Not on file    Physically abused: Not on file    Forced sexual activity: Not on file  Other Topics Concern  . Not on file  Social History Narrative  . Not on file    Family History  Problem Relation Age of Onset  . Diabetes Mother 90       Deceased  . Heart disease Father 21       Deceased  . Heart disease Maternal Uncle   . Diabetes Maternal Uncle   .  Heart disease Brother   . Diabetes Brother   . Diabetes Sister        #1  . Heart disease Sister        #2  . Hyperlipidemia Son        x2    ROS: Some fatigue but no fevers or chills, productive cough, hemoptysis, dysphasia, odynophagia, melena, hematochezia, dysuria, hematuria, rash, seizure activity, orthopnea, PND, claudication. Remaining systems are negative.  Physical Exam: Well-developed well-nourished in no acute distress.  Skin is warm and dry.  HEENT is normal.  Neck is supple.  Chest is clear to auscultation with normal expansion.  Cardiovascular exam is regular rate and rhythm. 1/6 systolic murmur; no DM Abdominal exam nontender or distended. No masses palpated. Extremities show trace edema. neuro grossly intact  ECG-bradycardia at a rate of 48, right bundle branch block, first-degree AV block.  Personally reviewed  A/P  1 paroxysmal atrial fibrillation-patient remains in sinus rhythm today.  Continue amiodarone.  Continue apixaban.  Check TSH, liver functions, hemoglobin, renal function and chest x-ray.  2 prior aortic valve replacement-normal function on most recent echocardiogram.  Continue SBE prophylaxis.  3 hypertension-blood pressure mildly elevated.  However he states typically controlled at home.  Continue present medications and adjust based on follow-up readings.  4 hyperlipidemia-continue statin.   5 coronary artery disease status post coronary artery bypass and graft-continue statin.  He is not on aspirin given need for anticoagulation.  6 prior abdominal aortic aneurysm-not evident on most recent ultrasound.  Kirk Ruths, MD

## 2019-07-29 ENCOUNTER — Other Ambulatory Visit: Payer: Self-pay | Admitting: Cardiology

## 2019-07-29 ENCOUNTER — Other Ambulatory Visit: Payer: Self-pay

## 2019-07-29 ENCOUNTER — Encounter: Payer: Self-pay | Admitting: Cardiology

## 2019-07-29 ENCOUNTER — Ambulatory Visit (INDEPENDENT_AMBULATORY_CARE_PROVIDER_SITE_OTHER): Payer: Medicare Other | Admitting: Cardiology

## 2019-07-29 ENCOUNTER — Ambulatory Visit (HOSPITAL_BASED_OUTPATIENT_CLINIC_OR_DEPARTMENT_OTHER)
Admission: RE | Admit: 2019-07-29 | Discharge: 2019-07-29 | Disposition: A | Discharge status: Home / Self Care | Payer: Medicare Other | Source: Ambulatory Visit | Attending: Cardiology | Admitting: Cardiology

## 2019-07-29 VITALS — BP 160/66 | HR 48 | Ht 68.5 in | Wt 173.8 lb

## 2019-07-29 DIAGNOSIS — Z952 Presence of prosthetic heart valve: Secondary | ICD-10-CM | POA: Diagnosis not present

## 2019-07-29 DIAGNOSIS — E785 Hyperlipidemia, unspecified: Secondary | ICD-10-CM | POA: Diagnosis not present

## 2019-07-29 DIAGNOSIS — Z79899 Other long term (current) drug therapy: Secondary | ICD-10-CM

## 2019-07-29 DIAGNOSIS — Z951 Presence of aortocoronary bypass graft: Secondary | ICD-10-CM

## 2019-07-29 DIAGNOSIS — I10 Essential (primary) hypertension: Secondary | ICD-10-CM | POA: Diagnosis not present

## 2019-07-29 DIAGNOSIS — I2510 Atherosclerotic heart disease of native coronary artery without angina pectoris: Secondary | ICD-10-CM | POA: Diagnosis not present

## 2019-07-29 DIAGNOSIS — J984 Other disorders of lung: Secondary | ICD-10-CM | POA: Diagnosis not present

## 2019-07-29 NOTE — Patient Instructions (Addendum)
Your physician recommends that you continue on your current medications as directed. Please refer to the Current Medication list given to you today.  A chest x-ray takes a picture of the organs and structures inside the chest, including the heart, lungs, and blood vessels. This test can show several things, including, whether the heart is enlarges; whether fluid is building up in the lungs; and whether pacemaker / defibrillator leads are still in place.  Your physician recommends that you return for lab work in:  TODAY CBC BMET LIPID LIVER AND TSH  Your physician wants you to follow-up in: [place] will receive a reminder letter in the mail two months in advance. If you don't receive a letter, please call our office to schedule the follow-up appointment.

## 2019-07-30 LAB — BASIC METABOLIC PANEL
BUN/Creatinine Ratio: 20 (ref 10–24)
BUN: 26 mg/dL (ref 8–27)
CO2: 25 mmol/L (ref 20–29)
Calcium: 9.2 mg/dL (ref 8.6–10.2)
Chloride: 100 mmol/L (ref 96–106)
Creatinine, Ser: 1.29 mg/dL — ABNORMAL HIGH (ref 0.76–1.27)
GFR calc Af Amer: 58 mL/min/{1.73_m2} — ABNORMAL LOW (ref >59)
GFR calc non Af Amer: 50 mL/min/{1.73_m2} — ABNORMAL LOW (ref >59)
Glucose: 189 mg/dL — ABNORMAL HIGH (ref 65–99)
Potassium: 4.9 mmol/L (ref 3.5–5.2)
Sodium: 138 mmol/L (ref 134–144)

## 2019-07-30 LAB — CBC
Hematocrit: 40.2 % (ref 37.5–51.0)
Hemoglobin: 13.2 g/dL (ref 13.0–17.7)
MCH: 33 pg (ref 26.6–33.0)
MCHC: 32.8 g/dL (ref 31.5–35.7)
MCV: 101 fL — ABNORMAL HIGH (ref 79–97)
Platelets: 161 10*3/uL (ref 150–450)
RBC: 4 x10E6/uL — ABNORMAL LOW (ref 4.14–5.80)
RDW: 11.9 % (ref 11.6–15.4)
WBC: 7.7 10*3/uL (ref 3.4–10.8)

## 2019-07-30 LAB — HEPATIC FUNCTION PANEL
ALT: 45 IU/L — ABNORMAL HIGH (ref 0–44)
AST: 28 IU/L (ref 0–40)
Albumin: 4.1 g/dL (ref 3.6–4.6)
Alkaline Phosphatase: 64 IU/L (ref 39–117)
Bilirubin Total: 0.4 mg/dL (ref 0.0–1.2)
Bilirubin, Direct: 0.13 mg/dL (ref 0.00–0.40)
Total Protein: 6.3 g/dL (ref 6.0–8.5)

## 2019-07-30 LAB — LIPID PANEL
Chol/HDL Ratio: 3.2 ratio (ref 0.0–5.0)
Cholesterol, Total: 133 mg/dL (ref 100–199)
HDL: 42 mg/dL (ref >39)
LDL Chol Calc (NIH): 72 mg/dL (ref 0–99)
Triglycerides: 102 mg/dL (ref 0–149)
VLDL Cholesterol Cal: 19 mg/dL (ref 5–40)

## 2019-07-30 LAB — TSH: TSH: 2.73 u[IU]/mL (ref 0.450–4.500)

## 2019-08-12 ENCOUNTER — Other Ambulatory Visit: Payer: Self-pay

## 2019-08-12 ENCOUNTER — Ambulatory Visit (INDEPENDENT_AMBULATORY_CARE_PROVIDER_SITE_OTHER): Payer: Medicare Other

## 2019-08-12 DIAGNOSIS — Z23 Encounter for immunization: Secondary | ICD-10-CM | POA: Diagnosis not present

## 2019-08-12 NOTE — Progress Notes (Signed)
Patient came in for flu shot per Dr. [name]

## 2019-08-17 IMAGING — CT CT ANGIO CHEST
1 of 8 series · 1 of 28 positions shown · IV contrast (iopamidol)
Comparison: None.

CLINICAL DATA: 85-year-old male with history of severe aortic
stenosis. Preprocedural study prior to potential transcatheter
aortic valve replacement (TAVR) procedure.

EXAM:
CT ANGIOGRAPHY CHEST, ABDOMEN AND PELVIS
TECHNIQUE: Multidetector CT imaging through the chest, abdomen and pelvis was
performed using the standard protocol during bolus administration of
intravenous contrast. Multiplanar reconstructed images and MIPs were
obtained and reviewed to evaluate the vascular anatomy.
CONTRAST:  50mL 1CQEEX-9PJ IOPAMIDOL (1CQEEX-9PJ) INJECTION 76%

[Series 399: — · 0.21mm/px · 1 of 7 slices shown]
[im 5/7]
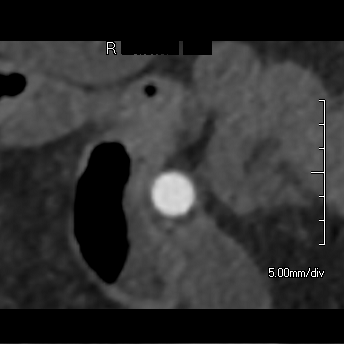

[1 of 28 positions shown; findings below may reference images not displayed]

FINDINGS: CTA CHEST FINDINGS

Cardiovascular: Heart size is enlarged with left atrial dilatation.
There is no significant pericardial fluid, thickening or pericardial
calcification. There is aortic atherosclerosis, as well as
atherosclerosis of the great vessels of the mediastinum and the
coronary arteries, including calcified atherosclerotic plaque in the
left main, left anterior descending, left circumflex and right
coronary arteries. Status post median sternotomy for CABG including
[REDACTED] to the LAD. Severe thickening calcification of the aortic
valve.

Mediastinum/Lymph Nodes: No pathologically enlarged mediastinal or
hilar lymph nodes. Large hiatal hernia. No axillary lymphadenopathy.

Lungs/Pleura: Linear areas of scarring in the lung bases
bilaterally. No consolidative airspace disease. No pleural
effusions. No suspicious appearing pulmonary nodules or masses are
noted.

Musculoskeletal/Soft Tissues: Median sternotomy wires. There are no
aggressive appearing lytic or blastic lesions noted in the
visualized portions of the skeleton.

CTA ABDOMEN AND PELVIS FINDINGS

Hepatobiliary: No suspicious cystic or solid hepatic lesions. No
intra or extrahepatic biliary ductal dilatation. Gallbladder is
normal in appearance.

Pancreas: No pancreatic mass. No pancreatic ductal dilatation. No
pancreatic or peripancreatic fluid or inflammatory changes.

Spleen: Unremarkable.

Adrenals/Urinary Tract: Low-attenuation lesions in both kidneys,
compatible with simple cysts, largest of which measures up to 1.7 cm
in the lower pole of the left kidney. Extensive cortical thinning
throughout the interpolar and upper pole region of the left kidney,
particularly posteriorly, presumably sequela of prior renal
infarction. Some subcentimeter low-attenuation lesions in the left
kidney are too small to definitively characterize, but statistically
likely to represent cysts. Bilateral adrenal glands are normal in
appearance. No hydroureteronephrosis. Urinary bladder is normal in
appearance.

Stomach/Bowel: Intraabdominal portion of the stomach is normal in
appearance. No pathologic dilatation of small bowel or colon. Normal
appendix.

Vascular/Lymphatic: Aortic atherosclerosis with multifocal ectasia
of the infrarenal abdominal aorta which measures up to 2.5 x 2.6 cm.
Findings and measurements pertinent to potential TAVR procedure, as
detailed below. Small penetrating ulcer in the mid right common
iliac artery (axial image 152 of series 15). Celiac axis, superior
mesenteric artery and inferior mesenteric artery are all widely
patent without hemodynamically significant stenosis. Single right
renal artery is widely patent. On the left, there are 2 small renal
arteries, both of which are widely patent without hemodynamically
significant stenosis. There appears to be a third remnant of a renal
artery which is likely chronically occluded extending to the
posterior aspect of the left kidney in the distribution of the
chronic cortical scarring. No lymphadenopathy noted in the abdomen
or pelvis.

Reproductive: Prostate gland and seminal vesicles are unremarkable
in appearance. Small focal fluid collection in the right inguinal
region measuring 2.0 x 3.2 cm is of uncertain etiology and
significance, but has a benign appearance, potentially some
chronically loculated ascites.

Other: No significant volume of free flowing ascites. No
pneumoperitoneum.

Musculoskeletal: There are no aggressive appearing lytic or blastic
lesions noted in the visualized portions of the skeleton.

VASCULAR MEASUREMENTS PERTINENT TO TAVR:

AORTA:

Minimal Aortic Diameter-M5 x 16 mm

Severity of Aortic Calcification-moderate to severe

RIGHT PELVIS:

Right Common Iliac Artery -

Minimal Hiameter-Y.Q x 9.5 mm

Tortuosity-mild

Calcification-moderate to severe

Note-small penetrating ulcer in the mid right common iliac artery
(axial image 152 of series 15).

Right External Iliac Artery -

Minimal Iiameter-W.8 x 8.6 mm

Tortuosity-severe

Calcification-minimal

Right Common Femoral Artery -

Minimal Iiameter-W.8 x 9.7 mm

Tortuosity-mild

Calcification-mild

LEFT PELVIS:

Left Common Iliac Artery -

Minimal Kiameter-U.M x 7.8 mm

Tortuosity-mild

Calcification-moderate to severe

Left External Iliac Artery -

Minimal 2iameter-Z.Z x 9.0 mm

Tortuosity-moderate

Calcification-minimal

Left Common Femoral Artery -

Minimal Biameter-U.H x 8.8 mm

Tortuosity-mild

Calcification-mild

Review of the MIP images confirms the above findings.
IMPRESSION: 1. Vascular findings and measurements pertinent to potential TAVR
procedure, as detailed above.
2. Severe thickening calcification of the aortic valve, compatible
with the reported clinical history of severe aortic stenosis.
3. Large hiatal hernia. This may have implications for real-time
monitoring of the procedure by GUCK[facility] at time of upcoming TAVR.
4. Aortic atherosclerosis, in addition to left main and 3 vessel
coronary artery disease. Status post median sternotomy for CABG
including [REDACTED] to the LAD.
5. Small penetrating ulcer in the mid right common iliac artery.
6. Additional incidental findings, as above.

Aortic Atherosclerosis ([id number]).

## 2019-09-02 ENCOUNTER — Encounter: Payer: Self-pay | Admitting: Family Medicine

## 2019-09-03 ENCOUNTER — Telehealth: Payer: Self-pay | Admitting: Family Medicine

## 2019-09-03 MED ORDER — CONTOUR NEXT EZ W/DEVICE KIT: 1.0000 | PACK | Freq: Every day | 0 refills | Status: DC

## 2019-09-03 MED ORDER — MICROLET LANCETS MISC: 1 refills | Status: DC

## 2019-09-03 MED ORDER — CONTOUR NEXT TEST VI STRP: ORAL_STRIP | 12 refills | Status: DC

## 2019-09-03 NOTE — Telephone Encounter (Signed)
Sent patient new rx in.

## 2019-09-03 NOTE — Telephone Encounter (Signed)
Patient given new rx to take to pharmacy!

## 2019-09-03 NOTE — Telephone Encounter (Signed)
Pt came in office stating is needing a new meter for glucose and also strips for glucose meter, pt is stating is needing a rx for him to take to pharmacy. Please advise.

## 2019-09-30 NOTE — Progress Notes (Addendum)
[facility] [address], [place], [place] [address] [phone]  Date:  10/01/2019   Name:  Peter Scott   DOB:  December 22, 1931   MRN:  [id number]  PCP:  [name], MD    Chief Complaint: Annual Exam   History of Present Illness:  Peter Scott is a 85 y.o. 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  He does notice that he may get tired more easily and need to rest, but he is not having any CP.  He thinks this is likely his age and I agree At his last visit he had concern of hypoglycemia so I asked him to stop taking his glyburide- however his glucose went up, he got concerned and is taking a lower dose of glyburide again now We thought about using Januvia but it was too expensive This am his glucose was 61- he does have some borderline low glucose readings that we need to monitor He is back on glyburide 2.5 once a day He is also taking metformin 100 am and 500 pm   He saw his cardiologist, Dr. [name], in September 1 paroxysmal atrial fibrillation-patient remains in sinus rhythm today.  Continue amiodarone.  Continue apixaban.  Check TSH, liver functions, hemoglobin, renal function and chest x-ray. 2 prior aortic valve replacement-normal function on most recent echocardiogram.  Continue SBE prophylaxis. 3 hypertension-blood pressure mildly elevated.  However he states typically controlled at home.  Continue present medications and adjust based on follow-up readings. 4 hyperlipidemia-continue statin. 5 coronary artery disease  status post coronary artery bypass and graft-continue statin.  He is not on aspirin given need for anticoagulation. 6 prior abdominal aortic aneurysm-not evident on most recent ultrasound.  Due for foot exam today Can repeat A1c today Flu shot up-to-date Can suggest Shingrix  Gastric bypass surgery history is in chart.  Pt denies history of gastric bypass. Likely this was his cardiac bypass surgery.  Will remove gastric bypass from chart   He has noted some swelling of his bilateral feet that gets better over night and worse during the day Swelling is in feet to mid shins It is not painful at all Present for several weeks No weight change, no SOB or orthopnea   Wt Readings from Last 3 Encounters:  10/01/19 173 lb (78.5 kg)  07/29/19 173 lb 12.8 oz (78.8 kg)  03/09/19 172 lb (78 kg)   Weight is stable  Most recent echo 06/2019  Lab Results  Component Value Date   HGBA1C 8.2 (H) 05/29/2019    Patient Active Problem List   Diagnosis Date Noted  . Paroxysmal atrial fibrillation (HCC)   . 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 S/P CABG x 4   . Severe aortic stenosis   . Medicare annual wellness visit, subsequent 10/04/2014  . Bruit 09/29/2014  . Abdominal aortic aneurysm ([place]) 07/23/2014  . BCC (basal cell carcinoma) 07/23/2014  . SCC (squamous cell carcinoma), arm 07/23/2014  . Essential hypertension, benign 07/08/2014  . Coronary artery disease 07/08/2014  . Hyperlipidemia  LDL goal <100 07/08/2014  . Type II diabetes mellitus, well controlled ([place]) 07/08/2014  . Colon cancer screening 07/08/2014    Past Medical History:  Diagnosis Date  . Aortic aneurysm ([place])   . Aortic stenosis   . CAD (coronary artery disease)   . Diabetes mellitus type II, controlled ([place])   . Heart disease    Ablation  . History of chicken pox   . Hyperlipidemia   . Hypertension   . S/P CABG x 4    2001 - [place]  . S/P TAVR (transcatheter aortic valve  replacement) 04/01/2018   26 mm Edwards Sapien 3 transcatheter heart valve placed via percutaneous right transfemoral approach   . Umbilical hernia     Past Surgical History:  Procedure Laterality Date  . ABLATION     Rhythm problem; rhythm unknown; [place]  . CARDIOVERSION N/A 07/14/2018   Procedure: CARDIOVERSION;  Surgeon: Dorothy Spark, MD;  Location: [facility] ENDOSCOPY;  Service: Cardiovascular;  Laterality: N/A;  . CARDIOVERSION N/A 08/27/2018   Procedure: CARDIOVERSION;  Surgeon: Jerline Pain, MD;  Location: [facility] ENDOSCOPY;  Service: Cardiovascular;  Laterality: N/A;  . CORONARY ARTERY BYPASS GRAFT  2001  . EXPLORATION POST OPERATIVE OPEN HEART  2001, June  . INTRAOPERATIVE TRANSTHORACIC ECHOCARDIOGRAM N/A 04/01/2018   Procedure: INTRAOPERATIVE TRANSTHORACIC ECHOCARDIOGRAM;  Surgeon: Burnell Blanks, MD;  Location: [facility];  Service: Open Heart Surgery;  Laterality: N/A;  . RIGHT/LEFT HEART CATH AND CORONARY/GRAFT ANGIOGRAPHY N/A 01/31/2018   Procedure: RIGHT/LEFT HEART CATH AND CORONARY/GRAFT ANGIOGRAPHY;  Surgeon: Burnell Blanks, MD;  Location: [facility] CV LAB;  Service: Cardiovascular;  Laterality: N/A;  . TEE WITHOUT CARDIOVERSION N/A 01/17/2018   Procedure: TRANSESOPHAGEAL ECHOCARDIOGRAM (TEE);  Surgeon: Lelon Perla, MD;  Location: [facility] ENDOSCOPY;  Service: Cardiovascular;  Laterality: N/A;  . TEE WITHOUT CARDIOVERSION N/A 07/14/2018   Procedure: TRANSESOPHAGEAL ECHOCARDIOGRAM (TEE);  Surgeon: Dorothy Spark, MD;  Location: [facility] ENDOSCOPY;  Service: Cardiovascular;  Laterality: N/A;  . TONSILLECTOMY    . TRANSCATHETER AORTIC VALVE REPLACEMENT, TRANSFEMORAL N/A 04/01/2018   Procedure: TRANSCATHETER AORTIC VALVE REPLACEMENT, TRANSFEMORAL;  Surgeon: Burnell Blanks, MD;  Location: [facility];  Service: Open Heart Surgery;  Laterality: N/A;  . UMBILICAL HERNIA REPAIR  2015    Social History   Tobacco Use  . Smoking status: Former Smoker    Packs/day:  1.00    Years: 3.00    Pack years: 3.00  . Smokeless tobacco: Never Used  . Tobacco comment: quit tobbaco in his 86s  Substance Use Topics  . Alcohol use: No    Alcohol/week: 0.0 standard drinks  . Drug use: No    Family History  Problem Relation Age of Onset  . Diabetes Mother 26       Deceased  . Heart disease Father 33       Deceased  . Heart disease Maternal Uncle   . Diabetes Maternal Uncle   . Heart disease Brother   . Diabetes Brother   . Diabetes Sister        #1  . Heart disease Sister        #2  . Hyperlipidemia Son        x2    Allergies  Allergen Reactions  . Glipizide Palpitations    "Heart racing" "Heart racing"    Medication list has been reviewed and updated.  Current Outpatient Medications on File Prior to Visit  Medication Sig Dispense Refill  . acetaminophen (TYLENOL) 500 MG  tablet Take 1 tablet (500 mg total) by mouth every 6 (six) hours as needed. (Patient taking differently: Take 500 mg by mouth every 6 (six) hours as needed for moderate pain or headache. ) 30 tablet 0  . amiodarone (PACERONE) 200 MG tablet Take 1 tablet (200 mg total) by mouth daily. 90 tablet 3  . amLODipine (NORVASC) 5 MG tablet Take 1 tablet (5 mg total) by mouth daily.    Marland Kitchen amoxicillin (AMOXIL) 500 MG tablet Take 4 tablets (2,000 mg total) by mouth as directed. Take 1 hour prior to dental visits. 8 tablet 6  . apixaban (ELIQUIS) 5 MG TABS tablet Take 1 tablet (5 mg total) by mouth 2 (two) times daily. 60 tablet 6  . atorvastatin (LIPITOR) 80 MG tablet Take 0.5 tablets (40 mg total) by mouth at bedtime. 90 tablet 1  . Blood Glucose Monitoring Suppl (CONTOUR NEXT EZ) w/Device KIT 1 kit by Does not apply route daily. Use once a day to check blood sugar.  Dx code: E11.9 1 kit 0  . glucose blood (CONTOUR NEXT TEST) test strip Use once a day to check blood sugar.  Dx code: E11.9 100 each 12  . glyBURIDE (DIABETA) 5 MG tablet Take 2.5 mg by mouth daily with breakfast.    . losartan  (COZAAR) 100 MG tablet Take 0.5 tablets (50 mg total) by mouth 2 (two) times daily. 90 tablet 1  . metFORMIN (GLUCOPHAGE) 500 MG tablet TAKE 2 TABLETS BY MOUTH EVERY MORNING THEN TAKE 1 TABLET BY MOUTH EVERY EVENING 270 tablet 1  . Sodium Fluoride (PREVIDENT 5000 BOOSTER PLUS) 1.1 % PSTE Place 1 application onto teeth at bedtime.     Marland Kitchen spironolactone (ALDACTONE) 25 MG tablet Take 0.5 tablets (12.5 mg total) by mouth daily. (Patient taking differently: Take 12.5 mg by mouth at bedtime. ) 45 tablet 1   No current facility-administered medications on file prior to visit.     Review of Systems:  As per HPI- otherwise negative. No fever or chills No CP or SOB  Physical Examination: Vitals:   10/01/19 0914  BP: (!) 152/82  Pulse: 66  Resp: 16  Temp: (!) 96.9 F (36.1 C)  SpO2: 96%   Vitals:   10/01/19 0914  Weight: 173 lb (78.5 kg)  Height: 5' 8.5" (1.74 m)   Body mass index is 25.92 kg/m. Ideal Body Weight: Weight in (lb) to have BMI = 25: 166.5  GEN: WDWN, NAD, Non-toxic, A & O x 3, normal weight, looks well  HEENT: Atraumatic, Normocephalic. Neck supple. No masses, No LAD. Ears and Nose: No external deformity. CV: RRR, No M/G/R. No JVD. No thrill. No extra heart sounds. PULM: CTA B, no wheezes, crackles, rhonchi. No retractions. No resp. distress. No accessory muscle use. ABD: S, NT, ND, No rebound. No HSM. EXTR: No c/c/e NEURO Normal gait.  PSYCH: Normally interactive. Conversant. Not depressed or anxious appearing.  Calm demeanor.  Foot exam: done today Normal except for soft edema of both feet, trace into mid shins   BP Readings from Last 3 Encounters:  10/01/19 (!) 152/82  07/29/19 (!) 160/66  03/09/19 (!) 167/63     Assessment and Plan: Controlled type 2 diabetes mellitus without complication, without long-term current use of insulin ([employer]) - Plan: Basic metabolic panel, Hemoglobin A1c  Essential hypertension, benign  Coronary artery disease due to lipid rich  plaque  Hyperlipidemia LDL goal <100  Foot swelling  Here today for a follow-up visit Suspect his foot  swelling is due to venous insuf as no other sign of symptomatic CHF Suggested salt decrease, foot elevation when seated, compression socks A1c pending- explained to pt that we don't want to be too aggressive with glucose control to avoid hypoglycemia  Encouraged shingles vaccine   Signed Lamar Blinks, MD  Received his labs 11/12- message to pt  Results for orders placed or performed in visit on 71/06/26  Basic metabolic panel  Result Value Ref Range   Sodium 136 135 - 145 mEq/L   Potassium 4.4 3.5 - 5.1 mEq/L   Chloride 101 96 - 112 mEq/L   CO2 25 19 - 32 mEq/L   Glucose, Bld 282 (H) 70 - 99 mg/dL   BUN 30 (H) 6 - 23 mg/dL   Creatinine, Ser 1.44 0.40 - 1.50 mg/dL   GFR 46.56 (L) >60.00 mL/min   Calcium 9.0 8.4 - 10.5 mg/dL  Hemoglobin A1c  Result Value Ref Range   Hgb A1c MFr Bld 8.4 (H) 4.6 - 6.5 %

## 2019-09-30 NOTE — Patient Instructions (Addendum)
It was great to see you again today, as alaways You might consider having the shingles vaccine given at your drugstore- Shingrix. This is a 2 shot series  I will be in touch with your labs asap I think your foot swelling is due to venous insufficiency, or veins that allow some fluid to leak out into the tissues Elevating your legs for a while during the day, watching salt intake, and especially wearing knee high compression socks will likely help If this is getting worse or if you have any shortness of breath/ unexpected weight gain please alert me

## 2019-10-01 ENCOUNTER — Encounter: Payer: Self-pay | Admitting: Family Medicine

## 2019-10-01 ENCOUNTER — Other Ambulatory Visit: Payer: Self-pay

## 2019-10-01 ENCOUNTER — Ambulatory Visit (INDEPENDENT_AMBULATORY_CARE_PROVIDER_SITE_OTHER): Payer: Medicare Other | Admitting: Family Medicine

## 2019-10-01 VITALS — BP 152/82 | HR 66 | Temp 96.9°F | Resp 16 | Ht 68.5 in | Wt 173.0 lb

## 2019-10-01 DIAGNOSIS — I10 Essential (primary) hypertension: Secondary | ICD-10-CM

## 2019-10-01 DIAGNOSIS — E119 Type 2 diabetes mellitus without complications: Secondary | ICD-10-CM | POA: Diagnosis not present

## 2019-10-01 DIAGNOSIS — I2583 Coronary atherosclerosis due to lipid rich plaque: Secondary | ICD-10-CM

## 2019-10-01 DIAGNOSIS — E785 Hyperlipidemia, unspecified: Secondary | ICD-10-CM | POA: Diagnosis not present

## 2019-10-01 DIAGNOSIS — I2510 Atherosclerotic heart disease of native coronary artery without angina pectoris: Secondary | ICD-10-CM | POA: Diagnosis not present

## 2019-10-01 DIAGNOSIS — M7989 Other specified soft tissue disorders: Secondary | ICD-10-CM | POA: Diagnosis not present

## 2019-10-01 LAB — BASIC METABOLIC PANEL
BUN: 30 mg/dL — ABNORMAL HIGH (ref 6–23)
CO2: 25 mEq/L (ref 19–32)
Calcium: 9 mg/dL (ref 8.4–10.5)
Chloride: 101 mEq/L (ref 96–112)
Creatinine, Ser: 1.44 mg/dL (ref 0.40–1.50)
GFR: 46.56 mL/min — ABNORMAL LOW (ref >60.00)
Glucose, Bld: 282 mg/dL — ABNORMAL HIGH (ref 70–99)
Potassium: 4.4 mEq/L (ref 3.5–5.1)
Sodium: 136 mEq/L (ref 135–145)

## 2019-10-01 LAB — HEMOGLOBIN A1C: Hgb A1c MFr Bld: 8.4 % — ABNORMAL HIGH (ref 4.6–6.5)

## 2019-10-05 IMAGING — DX DG CHEST 1V PORT
1 series · 1 of 1 positions shown · non-contrast
Comparison: Chest x-ray of March 26, 2018

CLINICAL DATA: Status post transcatheter aortic valve replacement.
History of coronary artery disease, aortic stenosis, previous CABG.

EXAM:
PORTABLE CHEST 1 VIEW

[chest ap]
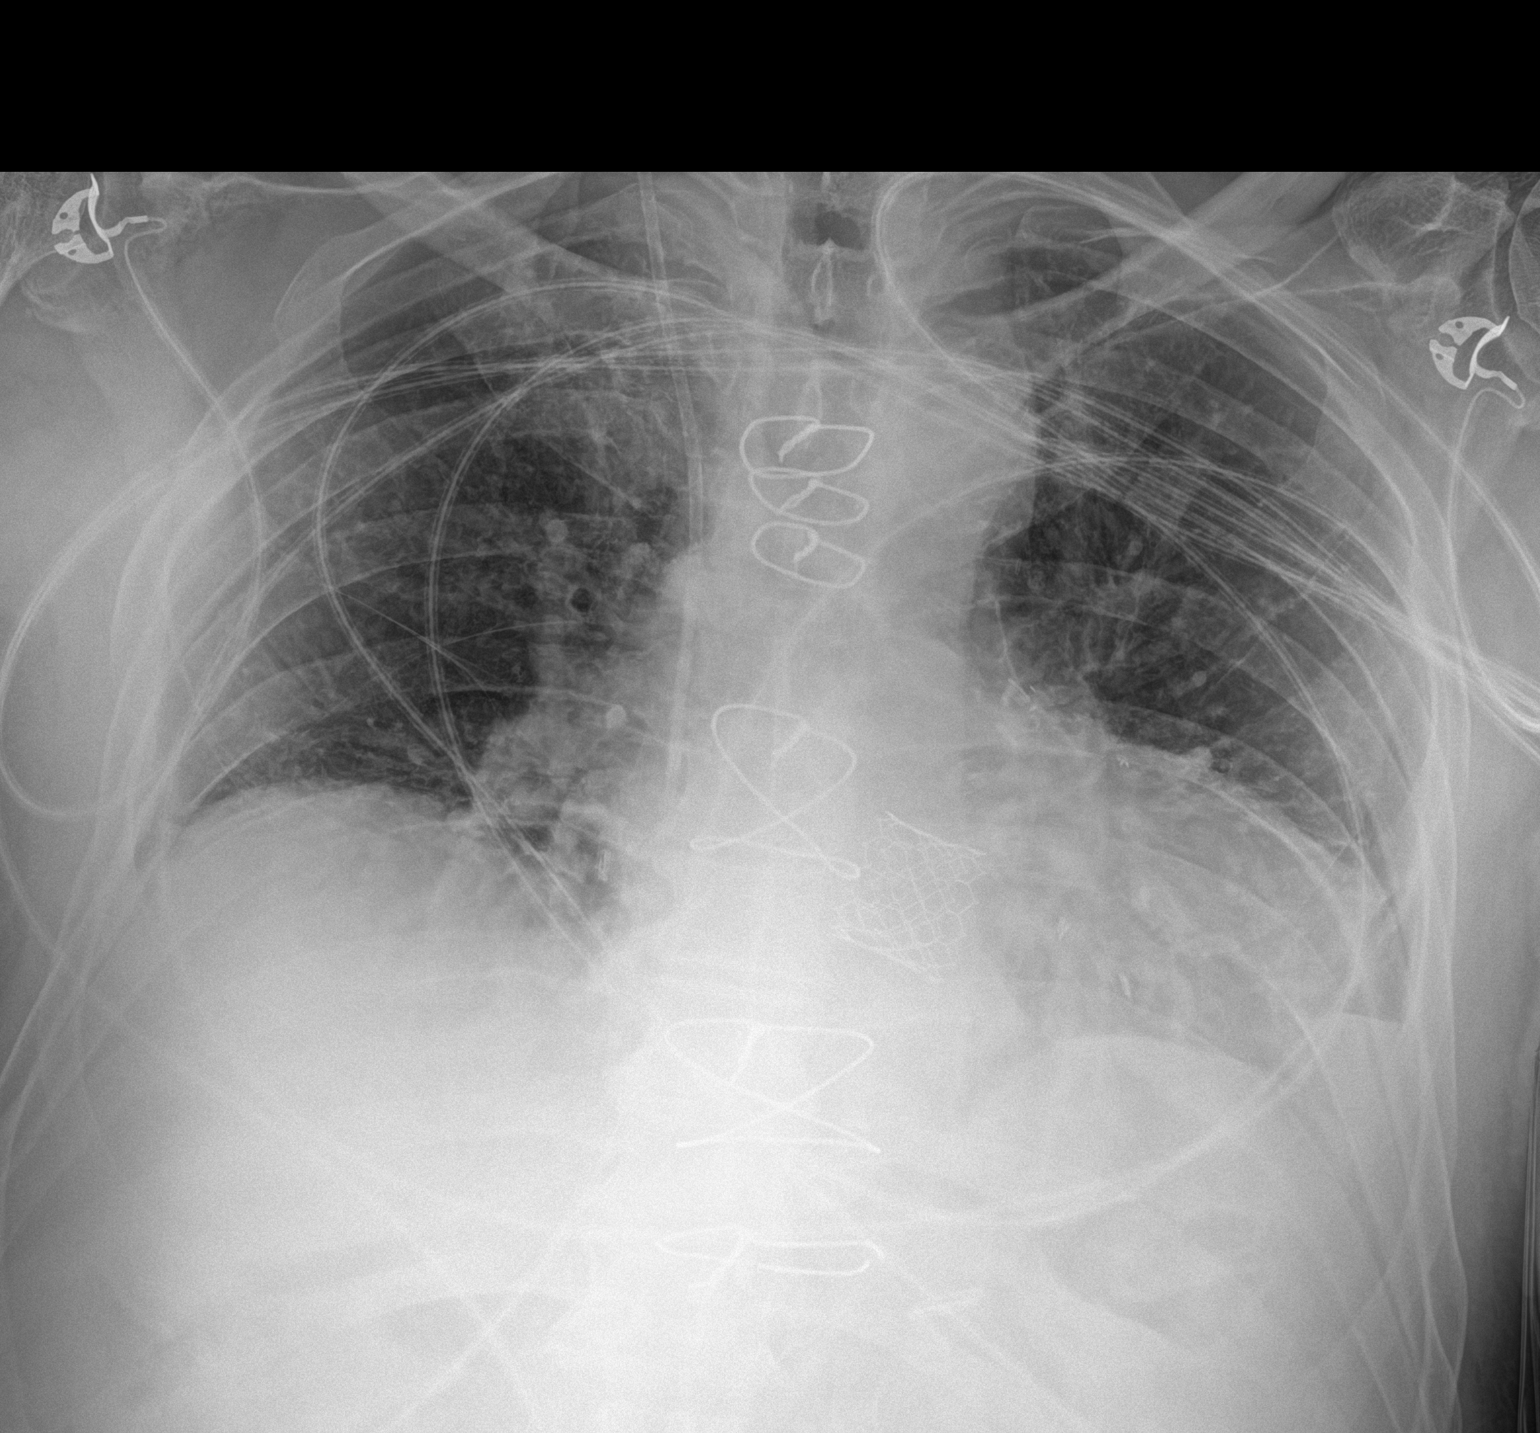

[1 of 1 positions shown; findings below may reference images not displayed]

FINDINGS: The lungs remain hypoinflated. The interstitial markings are coarse
though stable. There is a small left pleural effusion. The heart is
mildly enlarged. The pulmonary vascularity is not clearly engorged.
A prosthetic aortic valve cage is visible. There is calcification in
the wall of the aortic arch. The sternal wires are intact. The right
internal jugular venous catheter tip projects over the junction of
the middle and distal thirds of the SVC.
IMPRESSION: Mild hypoinflation. Allowing for differences in radiographic
technique and positioning no acute postprocedure complication is
observed.

## 2019-10-12 ENCOUNTER — Other Ambulatory Visit: Payer: Self-pay | Admitting: Family Medicine

## 2019-10-12 MED FILL — metFORMIN HCL 500 MG TABS: 500 | 90 days supply | Qty: 270 | Fill #0

## 2019-11-04 ENCOUNTER — Telehealth (HOSPITAL_COMMUNITY): Payer: Self-pay | Admitting: *Deleted

## 2019-11-04 ENCOUNTER — Encounter: Payer: Self-pay | Admitting: Family Medicine

## 2019-11-04 DIAGNOSIS — I6523 Occlusion and stenosis of bilateral carotid arteries: Secondary | ICD-10-CM

## 2019-11-04 NOTE — Telephone Encounter (Signed)
I identified myself and attempted to schedule exam request placed by Dr. [name].  Mr. Yeargin stated he did not have any problems and then hung up.  I will notify office of cancellation of order for carotid duplex exam.

## 2019-11-07 ENCOUNTER — Encounter: Payer: Self-pay | Admitting: Family Medicine

## 2019-12-04 DIAGNOSIS — Z1211 Encounter for screening for malignant neoplasm of colon: Secondary | ICD-10-CM | POA: Diagnosis not present

## 2019-12-14 ENCOUNTER — Other Ambulatory Visit: Payer: Self-pay | Admitting: Family Medicine

## 2019-12-14 MED FILL — glyBURIDE 5 MG TABS: 5 | 90 days supply | Qty: 90 | Fill #0

## 2019-12-18 ENCOUNTER — Other Ambulatory Visit: Payer: Self-pay | Admitting: *Deleted

## 2019-12-18 DIAGNOSIS — I714 Abdominal aortic aneurysm, without rupture, unspecified: Secondary | ICD-10-CM

## 2019-12-21 ENCOUNTER — Encounter: Payer: Self-pay | Admitting: *Deleted

## 2019-12-22 ENCOUNTER — Encounter: Payer: Self-pay | Admitting: Family Medicine

## 2019-12-24 ENCOUNTER — Ambulatory Visit (HOSPITAL_BASED_OUTPATIENT_CLINIC_OR_DEPARTMENT_OTHER)
Admission: RE | Admit: 2019-12-24 | Discharge: 2019-12-24 | Disposition: A | Discharge status: Home / Self Care | Payer: Medicare Other | Source: Ambulatory Visit | Attending: Cardiology | Admitting: Cardiology

## 2019-12-24 ENCOUNTER — Other Ambulatory Visit: Payer: Self-pay

## 2019-12-24 DIAGNOSIS — I714 Abdominal aortic aneurysm, without rupture, unspecified: Secondary | ICD-10-CM

## 2019-12-24 NOTE — Progress Notes (Signed)
Abdominal Aortic Duplex    12/24/19 Peter Scott

## 2020-01-11 MED FILL — metFORMIN HCL 500 MG TABS: 500 | 90 days supply | Qty: 270 | Fill #1

## 2020-01-19 NOTE — Progress Notes (Signed)
HPI: FU CAD andAS. Patient is status post coronary artery bypass graft in [place] in 2001. He also had ablation of what sounds to be SVT at that time. Carotid Dopplers March 2019 showed 1 to 39% bilateral stenosis. Patient had preoperative cardiac catheterization that revealed severe native three-vessel coronary artery disease with patency of grafts. He underwent TAVR 5/19. Also with h/o PAF. Echocardiogram August 2020 showed ejection fraction 45 to 50%, moderate diastolic dysfunction, mild left atrial enlargement, prior aortic valve replacement with trace aortic insufficiency and mean gradient 13 mmHg. Abd ultrasound 2/21 showed no AAA. Since last seen,he has mild dyspnea on exertion and occasional mild pedal edema.  No chest pain, palpitations or syncope.  Current Outpatient Medications  Medication Sig Dispense Refill  . acetaminophen (TYLENOL) 500 MG tablet Take 1 tablet (500 mg total) by mouth every 6 (six) hours as needed. (Patient taking differently: Take 500 mg by mouth every 6 (six) hours as needed for moderate pain or headache. ) 30 tablet 0  . amiodarone (PACERONE) 200 MG tablet Take 1 tablet (200 mg total) by mouth daily. 90 tablet 3  . amLODipine (NORVASC) 5 MG tablet Take 1 tablet (5 mg total) by mouth daily.    Marland Kitchen amoxicillin (AMOXIL) 500 MG capsule TAKE 4 CAPSULES (2,000 MG TOTAL) BY MOUTH AS DIRECTED. TAKE 1 HOUR PRIOR TO DENTAL VISITS. 8 capsule 6  . apixaban (ELIQUIS) 5 MG TABS tablet Take 1 tablet (5 mg total) by mouth 2 (two) times daily. 60 tablet 6  . atorvastatin (LIPITOR) 80 MG tablet Take 0.5 tablets (40 mg total) by mouth at bedtime. 90 tablet 1  . Blood Glucose Monitoring Suppl (CONTOUR NEXT EZ) w/Device KIT 1 kit by Does not apply route daily. Use once a day to check blood sugar.  Dx code: E11.9 1 kit 0  . glucose blood (CONTOUR NEXT TEST) test strip Use once a day to check blood sugar.  Dx code: E11.9 100 each 12  . glyBURIDE (DIABETA) 5 MG tablet TAKE 1/2 TABLET  (2.5 MG TOTAL) BY MOUTH 2 (TWO) TIMES DAILY WITH A MEAL. 90 tablet 3  . losartan (COZAAR) 100 MG tablet Take 0.5 tablets (50 mg total) by mouth 2 (two) times daily. 90 tablet 1  . metFORMIN (GLUCOPHAGE) 500 MG tablet TAKE 2 TABLETS BY MOUTH EVERY MORNING THEN TAKE 1 TABLET BY MOUTH EVERY EVENING 270 tablet 1  . spironolactone (ALDACTONE) 25 MG tablet Take 0.5 tablets (12.5 mg total) by mouth daily. (Patient taking differently: Take 12.5 mg by mouth at bedtime. ) 45 tablet 1   No current facility-administered medications for this visit.     Past Medical History:  Diagnosis Date  . Aortic aneurysm ([place])   . Aortic stenosis   . CAD (coronary artery disease)   . Diabetes mellitus type II, controlled ([place])   . Heart disease    Ablation  . History of chicken pox   . Hyperlipidemia   . Hypertension   . S/P CABG x 4    2001 - [place]  . 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 Umbilical hernia     Past Surgical History:  Procedure Laterality Date  . ABLATION     Rhythm problem; rhythm unknown; [place]  . CARDIOVERSION N/A 07/14/2018   Procedure: CARDIOVERSION;  Surgeon: Dorothy Spark, MD;  Location: [place];  Service:  Cardiovascular;  Laterality: N/A;  . CARDIOVERSION N/A 08/27/2018   Procedure: CARDIOVERSION;  Surgeon: Jerline Pain, MD;  Location: [place] [facility];  Service: Cardiovascular;  Laterality: N/A;  . CORONARY ARTERY BYPASS GRAFT  2001  . EXPLORATION POST OPERATIVE OPEN HEART  2001, June  . INTRAOPERATIVE TRANSTHORACIC ECHOCARDIOGRAM N/A 04/01/2018   Procedure: INTRAOPERATIVE TRANSTHORACIC ECHOCARDIOGRAM;  Surgeon: Burnell Blanks, MD;  Location: [place];  Service: Open Heart Surgery;  Laterality: N/A;  . RIGHT/LEFT HEART CATH AND CORONARY/GRAFT ANGIOGRAPHY N/A 01/31/2018   Procedure: RIGHT/LEFT HEART CATH AND CORONARY/GRAFT  ANGIOGRAPHY;  Surgeon: Burnell Blanks, MD;  Location: [place] [facility];  Service: Cardiovascular;  Laterality: N/A;  . TEE WITHOUT CARDIOVERSION N/A 01/17/2018   Procedure: TRANSESOPHAGEAL ECHOCARDIOGRAM (TEE);  Surgeon: Lelon Perla, MD;  Location: [facility] ENDOSCOPY;  Service: Cardiovascular;  Laterality: N/A;  . TEE WITHOUT CARDIOVERSION N/A 07/14/2018   Procedure: TRANSESOPHAGEAL ECHOCARDIOGRAM (TEE);  Surgeon: Dorothy Spark, MD;  Location: [facility] ENDOSCOPY;  Service: Cardiovascular;  Laterality: N/A;  . TONSILLECTOMY    . TRANSCATHETER AORTIC VALVE REPLACEMENT, TRANSFEMORAL N/A 04/01/2018   Procedure: TRANSCATHETER AORTIC VALVE REPLACEMENT, TRANSFEMORAL;  Surgeon: Burnell Blanks, MD;  Location: [place];  Service: Open Heart Surgery;  Laterality: N/A;  . UMBILICAL HERNIA REPAIR  2015    Social History   Socioeconomic History  . Marital status: Married    Spouse name: Not on file  . Number of children: 2  . Years of education: Not on file  . Highest education level: Not on file  Occupational History  . Not on file  Tobacco Use  . Smoking status: Former Smoker    Packs/day: 1.00    Years: 3.00    Pack years: 3.00  . Smokeless tobacco: Never Used  . Tobacco comment: quit tobbaco in his 42s  Substance and Sexual Activity  . Alcohol use: No    Alcohol/week: 0.0 standard drinks  . Drug use: No  . Sexual activity: Not on file  Other Topics Concern  . Not on file  Social History Narrative  . Not on file   Social Determinants of Health   Financial Resource Strain:   . Difficulty of Paying Living Expenses: Not on file  Food Insecurity:   . Worried About Charity fundraiser in the Last Year: Not on file  . Ran Out of Food in the Last Year: Not on file  Transportation Needs:   . Lack of Transportation (Medical): Not on file  . Lack of Transportation (Non-Medical): Not on file  Physical Activity:   . Days of Exercise per Week: Not on file  . Minutes of Exercise  per Session: Not on file  Stress:   . Feeling of Stress : Not on file  Social Connections:   . Frequency of Communication with Friends and Family: Not on file  . Frequency of Social Gatherings with Friends and Family: Not on file  . Attends Religious Services: Not on file  . Active Member of Clubs or Organizations: Not on file  . Attends Archivist Meetings: Not on file  . Marital Status: Not on file  Intimate Partner Violence:   . Fear of Current or Ex-Partner: Not on file  . Emotionally Abused: Not on file  . Physically Abused: Not on file  . Sexually Abused: Not on file    Family History  Problem Relation Age of Onset  . Diabetes Mother 34       Deceased  . Heart  disease Father 79       Deceased  . Heart disease Maternal Uncle   . Diabetes Maternal Uncle   . Heart disease Brother   . Diabetes Brother   . Diabetes Sister        #1  . Heart disease Sister        #2  . Hyperlipidemia Son        x2    ROS: no fevers or chills, productive cough, hemoptysis, dysphasia, odynophagia, melena, hematochezia, dysuria, hematuria, rash, seizure activity, orthopnea, PND, pedal edema, claudication. Remaining systems are negative.  Physical Exam: Well-developed well-nourished in no acute distress.  Skin is warm and dry.  HEENT is normal.  Neck is supple.  Chest is clear to auscultation with normal expansion.  Cardiovascular exam is regular rate and rhythm.  2/6 systolic murmur left sternal border.  No diastolic murmur. Abdominal exam nontender or distended. No masses palpated. Extremities show no edema. neuro grossly intact  ECG-sinus bradycardia at a rate of 53, first-degree AV block, right bundle branch block.  Personally reviewed  A/P  1 paroxysmal atrial fibrillation-patient is in sinus today. Continue amiodarone at present dose. Continue apixaban. Check TSH, liver functions, hemoglobin, renal function and chest x-ray.  2 prior aortic valve replacement-continue  SBE prophylaxis.  3 hypertension-blood pressure mildly elevated but controlled at home by his report.  We will follow and advance regimen as needed.  4 hyperlipidemia-continue statin.  5 coronary artery disease status post coronary artery bypass and graft-continue statin. No aspirin given need for anticoagulation.  6 question history of abdominal aortic aneurysm-not evident on most recent ultrasound.   Kirk Ruths, MD

## 2020-01-20 ENCOUNTER — Other Ambulatory Visit: Payer: Self-pay | Admitting: Physician Assistant

## 2020-01-20 DIAGNOSIS — L570 Actinic keratosis: Secondary | ICD-10-CM | POA: Diagnosis not present

## 2020-01-20 DIAGNOSIS — X32XXXS Exposure to sunlight, sequela: Secondary | ICD-10-CM | POA: Diagnosis not present

## 2020-01-20 DIAGNOSIS — L820 Inflamed seborrheic keratosis: Secondary | ICD-10-CM | POA: Diagnosis not present

## 2020-01-20 DIAGNOSIS — L814 Other melanin hyperpigmentation: Secondary | ICD-10-CM | POA: Diagnosis not present

## 2020-01-20 DIAGNOSIS — Z85828 Personal history of other malignant neoplasm of skin: Secondary | ICD-10-CM | POA: Diagnosis not present

## 2020-01-20 DIAGNOSIS — D485 Neoplasm of uncertain behavior of skin: Secondary | ICD-10-CM | POA: Diagnosis not present

## 2020-01-21 MED FILL — AMOXICILLIN 500 MG CAPSULE: 500 | 2 days supply | Qty: 8 | Fill #0

## 2020-01-27 ENCOUNTER — Ambulatory Visit (INDEPENDENT_AMBULATORY_CARE_PROVIDER_SITE_OTHER): Payer: Medicare Other | Admitting: Cardiology

## 2020-01-27 ENCOUNTER — Encounter: Payer: Self-pay | Admitting: Cardiology

## 2020-01-27 ENCOUNTER — Ambulatory Visit (HOSPITAL_BASED_OUTPATIENT_CLINIC_OR_DEPARTMENT_OTHER)
Admission: RE | Admit: 2020-01-27 | Discharge: 2020-01-27 | Disposition: A | Discharge status: Home / Self Care | Payer: Medicare Other | Source: Ambulatory Visit | Attending: Cardiology | Admitting: Cardiology

## 2020-01-27 ENCOUNTER — Other Ambulatory Visit: Payer: Self-pay

## 2020-01-27 VITALS — BP 156/62 | HR 53 | Ht 69.0 in | Wt 174.4 lb

## 2020-01-27 DIAGNOSIS — I480 Paroxysmal atrial fibrillation: Secondary | ICD-10-CM

## 2020-01-27 DIAGNOSIS — Z952 Presence of prosthetic heart valve: Secondary | ICD-10-CM

## 2020-01-27 DIAGNOSIS — J984 Other disorders of lung: Secondary | ICD-10-CM | POA: Diagnosis not present

## 2020-01-27 DIAGNOSIS — Z951 Presence of aortocoronary bypass graft: Secondary | ICD-10-CM | POA: Diagnosis not present

## 2020-01-27 DIAGNOSIS — I10 Essential (primary) hypertension: Secondary | ICD-10-CM | POA: Diagnosis not present

## 2020-01-27 DIAGNOSIS — E785 Hyperlipidemia, unspecified: Secondary | ICD-10-CM | POA: Diagnosis not present

## 2020-01-27 NOTE — Patient Instructions (Signed)
Medication Instructions:  NO CHANGE *If you need a refill on your cardiac medications before your next appointment, please call your pharmacy*   Lab Work: Your physician recommends that you HAVE LAB WORK TODAY If you have labs (blood work) drawn today and your tests are completely normal, you will receive your results only by: Marland Kitchen MyChart Message (if you have MyChart) OR . A paper copy in the mail If you have any lab test that is abnormal or we need to change your treatment, we will call you to review the results.   A chest x-ray takes a picture of the organs and structures inside the chest, including the heart, lungs, and blood vessels. This test can show several things, including, whether the heart is enlarges; whether fluid is building up in the lungs; and whether pacemaker / defibrillator leads are still in place. [place] [facility]  Follow-Up: At [facility], you and your health needs are our priority.  As part of our continuing mission to provide you with exceptional heart care, we have created designated Provider Care Teams.  These Care Teams include your primary Cardiologist (physician) and Advanced Practice Providers (APPs -  Physician Assistants and Nurse Practitioners) who all work together to provide you with the care you need, when you need it.  We recommend signing up for the patient portal called "MyChart".  Sign up information is provided on this After Visit Summary.  MyChart is used to connect with patients for Virtual Visits (Telemedicine).  Patients are able to view lab/test results, encounter notes, upcoming appointments, etc.  Non-urgent messages can be sent to your provider as well.   To learn more about what you can do with MyChart, go to [email or web address].    Your next appointment:   6 month(s)  The format for your next appointment:   Either In Person or Virtual  Provider:   Kirk Ruths, MD

## 2020-01-28 ENCOUNTER — Telehealth: Payer: Self-pay | Admitting: *Deleted

## 2020-01-28 DIAGNOSIS — I480 Paroxysmal atrial fibrillation: Secondary | ICD-10-CM

## 2020-01-28 LAB — COMPREHENSIVE METABOLIC PANEL
ALT: 39 IU/L (ref 0–44)
AST: 28 IU/L (ref 0–40)
Albumin/Globulin Ratio: 1.5 (ref 1.2–2.2)
Albumin: 3.9 g/dL (ref 3.6–4.6)
Alkaline Phosphatase: 73 IU/L (ref 39–117)
BUN/Creatinine Ratio: 14 (ref 10–24)
BUN: 23 mg/dL (ref 8–27)
Bilirubin Total: 0.4 mg/dL (ref 0.0–1.2)
CO2: 23 mmol/L (ref 20–29)
Calcium: 9.3 mg/dL (ref 8.6–10.2)
Chloride: 98 mmol/L (ref 96–106)
Creatinine, Ser: 1.63 mg/dL — ABNORMAL HIGH (ref 0.76–1.27)
GFR calc Af Amer: 44 mL/min/{1.73_m2} — ABNORMAL LOW (ref >59)
GFR calc non Af Amer: 38 mL/min/{1.73_m2} — ABNORMAL LOW (ref >59)
Globulin, Total: 2.6 g/dL (ref 1.5–4.5)
Glucose: 244 mg/dL — ABNORMAL HIGH (ref 65–99)
Potassium: 5 mmol/L (ref 3.5–5.2)
Sodium: 135 mmol/L (ref 134–144)
Total Protein: 6.5 g/dL (ref 6.0–8.5)

## 2020-01-28 LAB — CBC
Hematocrit: 36.5 % — ABNORMAL LOW (ref 37.5–51.0)
Hemoglobin: 12.6 g/dL — ABNORMAL LOW (ref 13.0–17.7)
MCH: 33.2 pg — ABNORMAL HIGH (ref 26.6–33.0)
MCHC: 34.5 g/dL (ref 31.5–35.7)
MCV: 96 fL (ref 79–97)
Platelets: 176 10*3/uL (ref 150–450)
RBC: 3.8 x10E6/uL — ABNORMAL LOW (ref 4.14–5.80)
RDW: 11.8 % (ref 11.6–15.4)
WBC: 7.8 10*3/uL (ref 3.4–10.8)

## 2020-01-28 LAB — TSH: TSH: 2.23 u[IU]/mL (ref 0.450–4.500)

## 2020-01-28 MED ORDER — APIXABAN 2.5 MG PO TABS: 2.5000 mg | ORAL_TABLET | Freq: Two times a day (BID) | ORAL | Status: DC

## 2020-01-28 NOTE — Telephone Encounter (Addendum)
Spoke with pt, Aware of dr Jacalyn Lefevre recommendations.   ----- Message from Lelon Perla, MD sent at 01/28/2020  7:21 AM EST ----- Change apixaban to 2.5 BID Kirk Ruths

## 2020-02-26 ENCOUNTER — Other Ambulatory Visit: Payer: Self-pay

## 2020-02-26 ENCOUNTER — Ambulatory Visit (HOSPITAL_BASED_OUTPATIENT_CLINIC_OR_DEPARTMENT_OTHER)
Admission: RE | Admit: 2020-02-26 | Discharge: 2020-02-26 | Disposition: A | Discharge status: Home / Self Care | Payer: Medicare Other | Source: Ambulatory Visit | Attending: Physician Assistant | Admitting: Physician Assistant

## 2020-02-26 ENCOUNTER — Encounter: Payer: Self-pay | Admitting: Physician Assistant

## 2020-02-26 DIAGNOSIS — Z952 Presence of prosthetic heart valve: Secondary | ICD-10-CM

## 2020-02-26 NOTE — Progress Notes (Signed)
  Echocardiogram 2D Echocardiogram has been performed.  Cardell Peach 02/26/2020, 10:40 AM

## 2020-03-23 ENCOUNTER — Telehealth: Payer: Self-pay | Admitting: Family Medicine

## 2020-03-23 NOTE — Progress Notes (Signed)
  Chronic Care Management   Note  03/23/2020 Name: LEONITUS RAFALSKI MRN: [id number] DOB: October 28, 1932  MYKAEL DEPENA is a 86 y.o. year old male who is a primary care patient of Copland, Gay Filler, MD. I reached out to [name] by phone today in response to a referral sent by Mr. Holly Mauch Koepke's PCP, Copland, Gay Filler, MD.   Mr. Rodela was given information about Chronic Care Management services today including:  1. CCM service includes personalized support from designated clinical staff supervised by his physician, including individualized plan of care and coordination with other care providers 2. 24/7 contact phone numbers for assistance for urgent and routine care needs. 3. Service will only be billed when office clinical staff spend 20 minutes or more in a month to coordinate care. 4. Only one practitioner may furnish and bill the service in a calendar month. 5. The patient may stop CCM services at any time (effective at the end of the month) by phone call to the office staff.   Patient agreed to services and verbal consent obtained.    This note is not being shared with the patient for the following reason: To respect privacy (The patient or proxy has requested that the information not be shared).  Follow up plan:   Raynicia Dukes [employer] Scheduler

## 2020-04-12 ENCOUNTER — Other Ambulatory Visit: Payer: Self-pay

## 2020-04-12 MED ORDER — CONTOUR NEXT TEST VI STRP: ORAL_STRIP | 12 refills | Status: DC

## 2020-04-19 ENCOUNTER — Other Ambulatory Visit: Payer: Self-pay | Admitting: Family Medicine

## 2020-04-19 MED FILL — METFORMIN HCL 500 MG TABS: 500 | 90 days supply | Qty: 270 | Fill #0

## 2020-05-10 ENCOUNTER — Telehealth: Payer: Medicare Other

## 2020-05-11 NOTE — Chronic Care Management (AMB) (Deleted)
Chronic Care Management Pharmacy  Name: Peter Scott  MRN: [id number] DOB: 27-Jun-1932   Chief Complaint/ HPI  Peter Scott,  86 y.o. , male presents for their Initial CCM visit with the clinical pharmacist via telephone due to COVID-19 Pandemic.  PCP : [name], MD  Their chronic conditions include: Hypertension, Hyperlipidemia, Diabetes, Atrial Fibrillation and Coronary Artery Disease   Office Visits: 10/01/19: Patient presented to Dr. [name] for follow-up. A1c worsened to 8.4%. Azelastine nasal spray stopped (patient no longer using)  Consult Visit: 01/27/20: Patient presented to Dr. [name] (Cardiology) for follow-up. Abd ultrasound showed no AAA. Eliquis reduced to 2.5 mg BID based on kidney function. Sodium fluoride dental solution stopped (patient no longer using).  Allergies  Allergen Reactions  . Glipizide Palpitations    "Heart racing" "Heart racing"    Medications: Outpatient Encounter Medications as of 05/12/2020  Medication Sig  . acetaminophen (TYLENOL) 500 MG tablet Take 1 tablet (500 mg total) by mouth every 6 (six) hours as needed. (Patient taking differently: Take 500 mg by mouth every 6 (six) hours as needed for moderate pain or headache. )  . amiodarone (PACERONE) 200 MG tablet Take 1 tablet (200 mg total) by mouth daily.  Marland Kitchen amLODipine (NORVASC) 5 MG tablet Take 1 tablet (5 mg total) by mouth daily.  Marland Kitchen amoxicillin (AMOXIL) 500 MG capsule TAKE 4 CAPSULES (2,000 MG TOTAL) BY MOUTH AS DIRECTED. TAKE 1 HOUR PRIOR TO DENTAL VISITS.  Marland Kitchen apixaban (ELIQUIS) 2.5 MG TABS tablet Take 1 tablet (2.5 mg total) by mouth 2 (two) times daily.  Marland Kitchen atorvastatin (LIPITOR) 80 MG tablet Take 0.5 tablets (40 mg total) by mouth at bedtime.  . Blood Glucose Monitoring Suppl (CONTOUR NEXT EZ) w/Device KIT 1 kit by Does not apply route daily. Use once a day to check blood sugar.  Dx code: E11.9  . glucose blood (CONTOUR NEXT TEST) test strip Use once a day to check blood  sugar.  Dx code: E11.9  . glyBURIDE (DIABETA) 5 MG tablet TAKE 1/2 TABLET (2.5 MG TOTAL) BY MOUTH 2 (TWO) TIMES DAILY WITH A MEAL.  Marland Kitchen losartan (COZAAR) 100 MG tablet Take 0.5 tablets (50 mg total) by mouth 2 (two) times daily.  . metFORMIN (GLUCOPHAGE) 500 MG tablet TAKE 2 TABLETS BY MOUTH EVERY MORNING THEN TAKE 1 TABLET BY MOUTH EVERY EVENING  . spironolactone (ALDACTONE) 25 MG tablet Take 0.5 tablets (12.5 mg total) by mouth daily. (Patient taking differently: Take 12.5 mg by mouth at bedtime. )   No facility-administered encounter medications on file as of 05/12/2020.     Current Diagnosis/Assessment:    Goals Addressed   None     AFIB   Patient is currently {CHL HP BP RATE/[phone]} controlled. HR *** BPM  Patient has failed these meds in past: *** Patient is currently {CHL Controlled/Uncontrolled:[phone]} on the following medications:  Amiodarone 200 mg daily  Apixaban 2.5 mg BID   We discussed:  {CHL HP [facility]stream Pharmacy [phone]}  Plan  Continue {CHL HP [facility]stream Pharmacy Plans:[phone]}  Hypertension   BP goal is:  {CHL HP UPSTREAM Pharmacist BP ranges:[phone]}  Office blood pressures are  BP Readings from Last 3 Encounters:  01/27/20 (!) 156/62  10/01/19 (!) 152/82  07/29/19 (!) 160/66   Patient checks BP at home {CHL HP BP Monitoring Frequency:[phone]} Patient home BP readings are ranging: ***  Patient has failed these meds in the past: *** Patient is currently {CHL Controlled/Uncontrolled:[phone]} on the following medications:  .  Amlodipine 5 mg daily . Losartan 100 mg 1/2 tab BID  . Spironolactone 25 mg 1/2 tab daily   We discussed {CHL HP Upstream Pharmacy discussion:[phone]}  Plan  Continue {CHL HP Upstream Pharmacy Plans:[phone]}     Diabetes   A1c goal {A1c goals:23924}  Recent Relevant Labs: Lab Results  Component Value Date/Time   HGBA1C 8.4 (H) 10/01/2019 09:48 AM   HGBA1C 8.2 (H)  05/29/2019 08:16 AM   GFR 46.56 (L) 10/01/2019 09:48 AM   GFR 44.79 (L) 05/29/2019 08:16 AM    Last diabetic Eye exam:  Lab Results  Component Value Date/Time   HMDIABEYEEXA No Retinopathy 05/20/2015 12:00 AM    Last diabetic Foot exam: No results found for: HMDIABFOOTEX   Checking BG: {CHL HP Blood Glucose Monitoring Frequency:[phone]}  Recent FBG Readings: *** Recent pre-meal BG readings: *** Recent 2hr PP BG readings:  *** Recent HS BG readings: ***  Patient has failed these meds in past: Januvia Patient is currently {CHL Controlled/[phone]} on the following medications: . Glyburide 5 mg 1/2 tab BID  . Metformin 500 mg 2 tab AM, 1 tab PM,  We discussed: {CHL HP Upstream Pharmacy discussion:[phone]}  Plan  Continue {CHL HP Upstream Pharmacy Plans:[phone]}  Hyperlipidemia   S/p CABG 2001, TAVR 5/19  LDL goal < ***  Lipid Panel     Component Value Date/Time   CHOL 133 07/29/2019 0827   TRIG 102 07/29/2019 0827   HDL 42 07/29/2019 0827   LDLCALC 72 07/29/2019 0827    Hepatic Function Latest Ref Rng & Units 01/27/2020 07/29/2019 01/14/2019  Total Protein 6.0 - 8.5 g/dL 6.5 6.3 6.7  Albumin 3.6 - 4.6 g/dL 3.9 4.1 4.0  AST 0 - 40 IU/L '28 28 23  '$ ALT 0 - 44 IU/L 39 45(H) 37  Alk Phosphatase 39 - 117 IU/L 73 64 74  Total Bilirubin 0.0 - 1.2 mg/dL 0.4 0.4 0.4  Bilirubin, Direct 0.00 - 0.40 mg/dL - 0.13 -     The ASCVD Risk score (Phelps., et al., 2013) failed to calculate for the following reasons:   The 2013 ASCVD risk score is only valid for ages 63 to 50   Patient has failed these meds in past: *** Patient is currently {CHL Controlled/[phone]} on the following medications:  . Atorvastatin 80 mg 1/2 tab daily   We discussed:  {CHL HP Upstream Pharmacy discussion:[phone]}  Plan  Continue {CHL HP Upstream Pharmacy Plans:[phone]}  Misc / OTC   . APAP 500 mg q6hr PRN  . Amoxicillin 500 mg 4 caps prior to dental  visits  We discussed:  ***  Plan  Continue {CHL HP Upstream Pharmacy NIDPO:[id number]}  Vaccines   Reviewed and discussed patient's vaccination history.    Immunization History  Administered Date(s) Administered  . Fluad Quad(high Dose 65+) 08/12/2019  . Influenza, High Dose Seasonal PF 08/25/2015, 09/02/2017, 08/25/2018  . Influenza,inj,Quad PF,6+ Mos 09/01/2014, 08/27/2016  . Pneumococcal Conjugate-13 10/04/2014  . Pneumococcal Polysaccharide-23 10/10/2015  . Tdap 01/05/2015    Plan  Recommended patient receive *** vaccine in *** office/pharmacy.   Medication Management   Pt uses [facility] pharmacy for all medications Uses pill box? {Yes or If no, why not?:20788} Pt endorses ***% compliance  We discussed: ***  Plan  {US Pharmacy RXVQ:00867}    Follow up: *** month phone visit  ***

## 2020-05-12 ENCOUNTER — Other Ambulatory Visit: Payer: Self-pay

## 2020-05-12 ENCOUNTER — Ambulatory Visit: Payer: Medicare Other | Admitting: Pharmacist

## 2020-05-12 DIAGNOSIS — E785 Hyperlipidemia, unspecified: Secondary | ICD-10-CM

## 2020-05-12 DIAGNOSIS — I10 Essential (primary) hypertension: Secondary | ICD-10-CM

## 2020-05-12 DIAGNOSIS — E119 Type 2 diabetes mellitus without complications: Secondary | ICD-10-CM

## 2020-05-12 NOTE — Chronic Care Management (AMB) (Signed)
Chronic Care Management Pharmacy  Name: Peter Scott  MRN: [id number] DOB: Apr 07, 1932   Chief Complaint/ HPI  Peter Scott,  86 y.o. , male presents for their Initial CCM visit with the clinical pharmacist via telephone due to COVID-19 Pandemic.  PCP : [name], MD  Their chronic conditions include: Hypertension, Hyperlipidemia, Diabetes, Atrial Fibrillation and Coronary Artery Disease   Office Visits: 10/01/19: Patient presented to Dr. [name] for follow-up. A1c worsened to 8.4%. Azelastine nasal spray stopped (patient no longer using)  Consult Visit: 01/27/20: Patient presented to Dr. [name] (Cardiology) for follow-up. Abd ultrasound showed no AAA. Eliquis reduced to 2.5 mg BID based on kidney function. Sodium fluoride dental solution stopped (patient no longer using).  Allergies  Allergen Reactions  . Glipizide Palpitations    "Heart racing" "Heart racing"    Medications: Outpatient Encounter Medications as of 05/12/2020  Medication Sig Note  . amiodarone (PACERONE) 200 MG tablet Take 1 tablet (200 mg total) by mouth daily.   Marland Kitchen amLODipine (NORVASC) 5 MG tablet Take 1 tablet (5 mg total) by mouth daily.   Marland Kitchen amoxicillin (AMOXIL) 500 MG capsule TAKE 4 CAPSULES (2,000 MG TOTAL) BY MOUTH AS DIRECTED. TAKE 1 HOUR PRIOR TO DENTAL VISITS.   Marland Kitchen apixaban (ELIQUIS) 2.5 MG TABS tablet Take 1 tablet (2.5 mg total) by mouth 2 (two) times daily.   Marland Kitchen atorvastatin (LIPITOR) 80 MG tablet Take 0.5 tablets (40 mg total) by mouth at bedtime. 05/12/2020: Has the '40mg'$  tablet  . glyBURIDE (DIABETA) 5 MG tablet TAKE 1/2 TABLET (2.5 MG TOTAL) BY MOUTH 2 (TWO) TIMES DAILY WITH A MEAL. 05/12/2020: Taking 1/2 tab once daily with evening meal  . losartan (COZAAR) 100 MG tablet Take 0.5 tablets (50 mg total) by mouth 2 (two) times daily.   . metFORMIN (GLUCOPHAGE) 500 MG tablet TAKE 2 TABLETS BY MOUTH EVERY MORNING THEN TAKE 1 TABLET BY MOUTH EVERY EVENING   . spironolactone (ALDACTONE) 25 MG  tablet Take 0.5 tablets (12.5 mg total) by mouth daily. (Patient taking differently: Take 12.5 mg by mouth at bedtime. )   . acetaminophen (TYLENOL) 500 MG tablet Take 1 tablet (500 mg total) by mouth every 6 (six) hours as needed. (Patient taking differently: Take 500 mg by mouth every 6 (six) hours as needed for moderate pain or headache. )   . Blood Glucose Monitoring Suppl (CONTOUR NEXT EZ) w/Device KIT 1 kit by Does not apply route daily. Use once a Sharol Croghan to check blood sugar.  Dx code: E11.9   . glucose blood (CONTOUR NEXT TEST) test strip Use once a Ambrielle Kington to check blood sugar.  Dx code: E11.9    No facility-administered encounter medications on file as of 05/12/2020.     Current Diagnosis/Assessment:    Goals Addressed            This Visit's Progress   . Chronic Care Management Pharmacy Care Plan       CARE PLAN ENTRY (see longitudinal plan of care for additional care plan information)  Current Barriers:  . Chronic Disease Management support, education, and care coordination needs related to Hypertension, Hyperlipidemia, Diabetes, Atrial Fibrillation and Coronary Artery Disease    Hypertension BP Readings from Last 3 Encounters:  01/27/20 (!) 156/62  10/01/19 (!) 152/82  07/29/19 (!) 160/66   . Pharmacist Clinical Goal(s): o Over the next 90 days, patient will work with PharmD and providers to achieve BP goal <140/90 . Current regimen:  . Amlodipine 5 mg  daily AM before breakfast . Losartan 100 mg 1/2 tab BID  . Spironolactone 25 mg 1/2 tab daily AM . Interventions: o Requested patient to record BP . Patient self care activities - Over the next 90 days, patient will: o Check BP 1-2 times per week, document, and provide at future appointments o Ensure daily salt intake < 2300 mg/Amare Kontos  Hyperlipidemia Lab Results  Component Value Date/Time   LDLCALC 72 07/29/2019 08:27 AM   . Pharmacist Clinical Goal(s): o Over the next 90 days, patient will work with PharmD and  providers to achieve LDL goal < 70 . Current regimen:  o Atorvastatin 40 mg daily . Interventions: o Discussed importance of diet and reducing consumption of cholesterol . Patient self care activities - Over the next 90 days, patient will: o Reduce consumption of cholesterol  Diabetes Lab Results  Component Value Date/Time   HGBA1C 8.4 (H) 10/01/2019 09:48 AM   HGBA1C 8.2 (H) 05/29/2019 08:16 AM   . Pharmacist Clinical Goal(s): o Over the next 90 days, patient will work with PharmD and providers to achieve A1c goal <8% . Current regimen:  . Glyburide 5 mg 1/2 tab BID (only taking 1/2 tab once daily with evening meal) . Metformin 500 mg 2 tab AM, 1 tab PM . Interventions: o Requested patient to check blood sugar 2 hours after meal a few times per week to assess if post prandial blood sugar is above goal o Discussed DM goals . Patient self care activities - Over the next 90 days, patient will: o Check blood sugar once daily, document, and provide at future appointments o Contact provider with any episodes of hypoglycemia  Health Maintenance  . Pharmacist Clinical Goal(s) o Over the next 90 days, patient will work with PharmD and providers to complete health maintenance screenings/vaccinations . Interventions: o Consider completing Shingrix vaccine series . Patient self care activities - Over the next 90 days, patient will: o Complete Shingrix vaccine series   Medication management . Pharmacist Clinical Goal(s): o Over the next 90 days, patient will work with PharmD and providers to maintain optimal medication adherence . Current pharmacy: [facility] . Interventions o Comprehensive medication review performed. o Continue current medication management strategy . Patient self care activities - Over the next 90 days, patient will: o Focus on medication adherence by filling and taking medications appropriately  o Take medications as prescribed o Report any  questions or concerns to PharmD and/or provider(s)  Initial goal documentation      Social Hx: Moved here from [place] to be closer to their kids.  Moved here 6 years ago. Only community outside of children is church, but this has been put on hold due to COVID. Married 65 years.  Has 2 sons and 2 granddaughters.   Also seen by provider at [facility]  AFIB   Patient is currently rhythm controlled.  Patient has failed these meds in past: None noted  Patient is currently controlled on the following medications:  Amiodarone 200 mg daily AM before breakfast Apixaban 2.5 mg BID   States he got a reduced dose of Eliquis due to it affecting his liver (agree with reduced dose due to age >56 and Scr >1.5) No chest pain  Plan -Continue current medications  Hypertension   CMP Latest Ref Rng & Units 01/27/2020 10/01/2019 07/29/2019  Glucose 65 - 99 mg/dL 968(Z) 389(X) 123(M)  BUN 8 - 27 mg/dL 23 26(L) 26  Creatinine 0.76 - 1.27 mg/dL 7.76(E) 5.98  1.29(H)  Sodium 134 - 144 mmol/L 135 136 138  Potassium 3.5 - 5.2 mmol/L 5.0 4.4 4.9  Chloride 96 - 106 mmol/L 98 101 100  CO2 20 - 29 mmol/L '23 25 25  '$ Calcium 8.6 - 10.2 mg/dL 9.3 9.0 9.2  Total Protein 6.0 - 8.5 g/dL 6.5 - 6.3  Total Bilirubin 0.0 - 1.2 mg/dL 0.4 - 0.4  Alkaline Phos 39 - 117 IU/L 73 - 64  AST 0 - 40 IU/L 28 - 28  ALT 0 - 44 IU/L 39 - 45(H)   Kidney Function Lab Results  Component Value Date/Time   CREATININE 1.63 (H) 01/27/2020 08:22 AM   CREATININE 1.44 10/01/2019 09:48 AM   CREATININE 1.02 07/08/2014 11:18 AM   GFR 46.56 (L) 10/01/2019 09:48 AM   GFRNONAA 38 (L) 01/27/2020 08:22 AM   GFRNONAA 69 07/08/2014 11:18 AM   GFRAA 44 (L) 01/27/2020 08:22 AM   GFRAA 80 07/08/2014 11:18 AM   K 5.0 01/27/2020 08:22 AM   K 4.4 10/01/2019 09:48 AM   BP goal is:  <140/90  Office blood pressures are  BP Readings from Last 3 Encounters:  01/27/20 (!) 156/62  10/01/19 (!) 152/82  07/29/19 (!) 160/66   Patient checks BP at  home 1-2x per week Patient home BP readings are ranging: 130s-140s/60s heart rate usually runs per patient report Feels his BP are lower in the evening vs the morning when his BP is checked during office visits  Patient has failed these meds in the past: None noted  Patient is currently uncontrolled per clinic BP, but controlled per home BP on the following medications:  . Amlodipine 5 mg daily AM before breakfast . Losartan 100 mg 1/2 tab BID  . Spironolactone 25 mg 1/2 tab daily AM  Patient checked BP while on phone. BP was 138/56 with pulse of 59. Patient checked BG while on 176 at 10:55am (breakfast was at 7am) We discussed Blood pressure goal  Plan -Record home BP readings -Continue current medications    Diabetes   A1c goal <8% (considering age and comorbidities) FBG goal 80-130 PPBG goal <180  Recent Relevant Labs: Lab Results  Component Value Date/Time   HGBA1C 8.4 (H) 10/01/2019 09:48 AM   HGBA1C 8.2 (H) 05/29/2019 08:16 AM   GFR 46.56 (L) 10/01/2019 09:48 AM   GFR 44.79 (L) 05/29/2019 08:16 AM    Last diabetic Eye exam:  Lab Results  Component Value Date/Time   HMDIABEYEEXA No Retinopathy 05/20/2015 12:00 AM    Last diabetic Foot exam: No results found for: HMDIABFOOTEX   Checking BG: Daily  Recent FBG Readings: States he has a new meter  103  85  87  74  85  83  121 had a big dinner night before 69  68  113  87  76  126 had a big dinner night before 85  85  80 Average  89.1875  Patient has failed these meds in past: Januvia (cost) Patient is currently uncontrolled based on a1c, but controlled per FBG on the following medications: . Glyburide 5 mg 1/2 tab BID (only taking 1/2 tab once daily with evening meal) . Metformin 500 mg 2 tab AM, 1 tab PM  States he moved glyburide dose to PM vs AM due to having hypoglycemia in the middle of the night when he took the the dose in the AM.  Feels hypoglycemia symptoms when his BG is less than  60. Treats hypoglycemia with glass of milk  Diet B - cereal and milk mostly sometimes egg and bacon L - fish, chicken, salads, sweet potato fries (largest meal of the Dilana Mcphie) D - rotisserie chicken wing, watermelon, cottage cheese Snacks - peanut butter crackers, celery sticks with peanut butter Drinks - water mainly, unsweet tea, diet coke, reduced sugar orange juice  Exercise Went to sports center pre-COVID [place] the farm with his sons  Suspect pt's BG is above goal after meals or glucometer is not properly calibrated although he reports his meter is new and he is confident that it is accurate.   We discussed: diet and exercise extensively, DM goals, how his fasting BG are at goal, but there is a high probability that his post prandial blood sugars are above goal noting his a1c and glucose levels from most recent cmp.   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  LDL goal <70  Lipid Panel     Component Value Date/Time   CHOL 133 07/29/2019 0827   TRIG 102 07/29/2019 0827   HDL 42 07/29/2019 0827   LDLCALC 72 07/29/2019 0827    Hepatic Function Latest Ref Rng & Units 01/27/2020 07/29/2019 01/14/2019  Total Protein 6.0 - 8.5 g/dL 6.5 6.3 6.7  Albumin 3.6 - 4.6 g/dL 3.9 4.1 4.0  AST 0 - 40 IU/L '28 28 23  '$ ALT 0 - 44 IU/L 39 45(H) 37  Alk Phosphatase 39 - 117 IU/L 73 64 74  Total Bilirubin 0.0 - 1.2 mg/dL 0.4 0.4 0.4  Bilirubin, Direct 0.00 - 0.40 mg/dL - 0.13 -     The ASCVD Risk score ([place]., et al., 2013) failed to calculate for the following reasons:   The 2013 ASCVD risk score is only valid for ages 12 to 83   Patient has failed these meds in past: None noted  Patient is currently uncontrolled on the following medications:  . Atorvastatin 40 mg daily (patient states he has the '40mg'$  tablet at home)  We discussed:  diet and exercise  extensively  Plan -Reduce consumption of cholesterol  -Continue current medications  Misc / OTC   . APAP 500 mg q6hr PRN  . Amoxicillin 500 mg 4 caps prior to dental visits  Plan -Continue current medications  Vaccines   Reviewed and discussed patient's vaccination history.    Immunization History  Administered Date(s) Administered  . Fluad Quad(high Dose 65+) 08/12/2019  . Influenza, High Dose Seasonal PF 08/25/2015, 09/02/2017, 08/25/2018  . Influenza,inj,Quad PF,6+ Mos 09/01/2014, 08/27/2016  . Pneumococcal Conjugate-13 10/04/2014  . Pneumococcal Polysaccharide-23 10/10/2015  . Tdap 01/05/2015   COVID Vaccine Dates  Moderna  12/23/19 01/20/20   Plan  Recommended patient receive Shingrix vaccine in pharmacy.   Medication Management   Pt uses [facility] for all medications Uses pill box? Yes fills it up every Sunday morning Pt endorses 100% compliance  Plan  Continue current medication management strategy   Miscellaneous Meds  Prevident 5000 at night (recommended by dentist)

## 2020-05-14 NOTE — Patient Instructions (Addendum)
Visit Information  Goals Addressed            This Visit's Progress   . Chronic Care Management Pharmacy Care Plan       CARE PLAN ENTRY (see longitudinal plan of care for additional care plan information)  Current Barriers:  . Chronic Disease Management support, education, and care coordination needs related to Hypertension, Hyperlipidemia, Diabetes, Atrial Fibrillation and Coronary Artery Disease    Hypertension BP Readings from Last 3 Encounters:  01/27/20 (!) 156/62  10/01/19 (!) 152/82  07/29/19 (!) 160/66   . Pharmacist Clinical Goal(s): o Over the next 90 days, patient will work with PharmD and providers to achieve BP goal <140/90 . Current regimen:  . Amlodipine 5 mg daily AM before breakfast . Losartan 100 mg 1/2 tab BID  . Spironolactone 25 mg 1/2 tab daily AM . Interventions: o Requested patient to record BP . Patient self care activities - Over the next 90 days, patient will: o Check BP 1-2 times per week, document, and provide at future appointments o Ensure daily salt intake < 2300 mg/Peter Scott  Hyperlipidemia Lab Results  Component Value Date/Time   LDLCALC 72 07/29/2019 08:27 AM   . Pharmacist Clinical Goal(s): o Over the next 90 days, patient will work with PharmD and providers to achieve LDL goal < 70 . Current regimen:  o Atorvastatin 40 mg daily . Interventions: o Discussed importance of diet and reducing consumption of cholesterol . Patient self care activities - Over the next 90 days, patient will: o Reduce consumption of cholesterol  Diabetes Lab Results  Component Value Date/Time   HGBA1C 8.4 (H) 10/01/2019 09:48 AM   HGBA1C 8.2 (H) 05/29/2019 08:16 AM   . Pharmacist Clinical Goal(s): o Over the next 90 days, patient will work with PharmD and providers to achieve A1c goal <8% . Current regimen:  . Glyburide 5 mg 1/2 tab BID (only taking 1/2 tab once daily with evening meal) . Metformin 500 mg 2 tab AM, 1 tab PM . Interventions: o Requested  patient to check blood sugar 2 hours after meal a few times per week to assess if post prandial blood sugar is above goal o Discussed DM goals . Patient self care activities - Over the next 90 days, patient will: o Check blood sugar once daily, document, and provide at future appointments o Contact provider with any episodes of hypoglycemia  Health Maintenance  . Pharmacist Clinical Goal(s) o Over the next 90 days, patient will work with PharmD and providers to complete health maintenance screenings/vaccinations . Interventions: o Consider completing Shingrix vaccine series . Patient self care activities - Over the next 90 days, patient will: o Complete Shingrix vaccine series   Medication management . Pharmacist Clinical Goal(s): o Over the next 90 days, patient will work with PharmD and providers to maintain optimal medication adherence . Current pharmacy: [facility] . Interventions o Comprehensive medication review performed. o Continue current medication management strategy . Patient self care activities - Over the next 90 days, patient will: o Focus on medication adherence by filling and taking medications appropriately  o Take medications as prescribed o Report any questions or concerns to PharmD and/or provider(s)  Initial goal documentation        Peter Scott was given information about Chronic Care Management services today including:  1. CCM service includes personalized support from designated clinical staff supervised by his physician, including individualized plan of care and coordination with other care providers 2. 24/7  contact phone numbers for assistance for urgent and routine care needs. 3. Standard insurance, coinsurance, copays and deductibles apply for chronic care management only during months in which we provide at least 20 minutes of these services. Most insurances cover these services at 100%, however patients may be responsible for any  copay, coinsurance and/or deductible if applicable. This service may help you avoid the need for more expensive face-to-face services. 4. Only one practitioner may furnish and bill the service in a calendar month. 5. The patient may stop CCM services at any time (effective at the end of the month) by phone call to the office staff.  Patient agreed to services and verbal consent obtained.   The patient verbalized understanding of instructions provided today and agreed to receive a mailed copy of patient instruction and/or educational materials. Telephone follow up appointment with pharmacy team member scheduled for: 06/08/2020  Peter Scott, PharmD Clinical Pharmacist [facility] [phone]    Cholesterol Content in Foods Cholesterol is a waxy, fat-like substance that helps to carry fat in the blood. The body needs cholesterol in small amounts, but too much cholesterol can cause damage to the arteries and heart. Most people should eat less than 200 milligrams (mg) of cholesterol a Peter Scott. Foods with cholesterol  Cholesterol is found in animal-based foods, such as meat, seafood, and dairy. Generally, low-fat dairy and lean meats have less cholesterol than full-fat dairy and fatty meats. The milligrams of cholesterol per serving (mg per serving) of common cholesterol-containing foods are listed below. Meat and other proteins  Egg -- one large whole egg has 186 mg.  Veal shank -- 4 oz has 141 mg.  Lean ground [place] (93% lean) -- 4 oz has 118 mg.  Fat-trimmed lamb loin -- 4 oz has 106 mg.  Lean ground beef (90% lean) -- 4 oz has 100 mg.  Lobster -- 3.5 oz has 90 mg.  Pork loin chops -- 4 oz has 86 mg.  Canned salmon -- 3.5 oz has 83 mg.  Fat-trimmed beef top loin -- 4 oz has 78 mg.  Frankfurter -- 1 frank (3.5 oz) has 77 mg.  Crab -- 3.5 oz has 71 mg.  Roasted chicken without skin, white meat -- 4 oz has 66 mg.  Light bologna -- 2 oz has 45  mg.  Deli-cut [place] -- 2 oz has 31 mg.  Canned tuna -- 3.5 oz has 31 mg.  Peter Scott -- 1 oz has 29 mg.  Oysters and mussels (raw) -- 3.5 oz has 25 mg.  Mackerel -- 1 oz has 22 mg.  Trout -- 1 oz has 20 mg.  Pork sausage -- 1 link (1 oz) has 17 mg.  Salmon -- 1 oz has 16 mg.  Tilapia -- 1 oz has 14 mg. Dairy  Soft-serve ice cream --  cup (4 oz) has 103 mg.  Whole-milk yogurt -- 1 cup (8 oz) has 29 mg.  Cheddar cheese -- 1 oz has 28 mg.  American cheese -- 1 oz has 28 mg.  Whole milk -- 1 cup (8 oz) has 23 mg.  2% milk -- 1 cup (8 oz) has 18 mg.  Cream cheese -- 1 tablespoon (Tbsp) has 15 mg.  Cottage cheese --  cup (4 oz) has 14 mg.  Low-fat (1%) milk -- 1 cup (8 oz) has 10 mg.  Sour cream -- 1 Tbsp has 8.5 mg.  Low-fat yogurt -- 1 cup (8 oz) has 8 mg.  Nonfat Greek yogurt -- 1 cup (8 oz) has 7 mg.  Half-and-half cream -- 1 Tbsp has 5 mg. Fats and oils  Cod liver oil -- 1 tablespoon (Tbsp) has 82 mg.  Butter -- 1 Tbsp has 15 mg.  Lard -- 1 Tbsp has 14 mg.  Bacon grease -- 1 Tbsp has 14 mg.  Mayonnaise -- 1 Tbsp has 5-10 mg.  Margarine -- 1 Tbsp has 3-10 mg. Exact amounts of cholesterol in these foods may vary depending on specific ingredients and brands. Foods without cholesterol Most plant-based foods do not have cholesterol unless you combine them with a food that has cholesterol. Foods without cholesterol include:  Grains and cereals.  Vegetables.  Fruits.  Vegetable oils, such as olive, canola, and sunflower oil.  Legumes, such as peas, beans, and lentils.  Nuts and seeds.  Egg whites. Summary  The body needs cholesterol in small amounts, but too much cholesterol can cause damage to the arteries and heart.  Most people should eat less than 200 milligrams (mg) of cholesterol a Peter Scott. This information is not intended to replace advice given to you by your health care provider. Make sure you discuss any questions you have with your health  care provider. Document Revised: 10/18/2017 Document Reviewed: 07/02/2017 Elsevier Patient Education  [place].

## 2020-06-08 ENCOUNTER — Other Ambulatory Visit: Payer: Self-pay

## 2020-06-08 ENCOUNTER — Ambulatory Visit: Payer: Medicare Other | Admitting: Pharmacist

## 2020-06-08 DIAGNOSIS — E119 Type 2 diabetes mellitus without complications: Secondary | ICD-10-CM

## 2020-06-08 DIAGNOSIS — I10 Essential (primary) hypertension: Secondary | ICD-10-CM

## 2020-06-08 MED FILL — glyBURIDE 5 MG TABS: 5 | 90 days supply | Qty: 90 | Fill #1

## 2020-06-08 NOTE — Chronic Care Management (AMB) (Signed)
Chronic Care Management Pharmacy  Name: Peter Scott  MRN: [id number] DOB: Apr 09, 1932   Chief Complaint/ HPI  Peter Scott,  86 y.o. , male presents for their Follow-Up CCM visit with the clinical pharmacist via telephone due to COVID-19 Pandemic.  PCP : Pearline Cables, MD  Their chronic conditions include: Hypertension, Hyperlipidemia, Diabetes, Atrial Fibrillation and Coronary Artery Disease   Office Visits: None since last CCM visit on 05/12/2020.   Consult Visit: None since last CCM visit on 05/12/2020. Marland Kitchen  Allergies  Allergen Reactions  . Glipizide Palpitations    "Heart racing" "Heart racing"    Medications: Outpatient Encounter Medications as of 06/08/2020  Medication Sig Note  . acetaminophen (TYLENOL) 500 MG tablet Take 1 tablet (500 mg total) by mouth every 6 (six) hours as needed. (Patient taking differently: Take 500 mg by mouth every 6 (six) hours as needed for moderate pain or headache. )   . amiodarone (PACERONE) 200 MG tablet Take 1 tablet (200 mg total) by mouth daily.   Marland Kitchen amLODipine (NORVASC) 5 MG tablet Take 1 tablet (5 mg total) by mouth daily.   Marland Kitchen amoxicillin (AMOXIL) 500 MG capsule TAKE 4 CAPSULES (2,000 MG TOTAL) BY MOUTH AS DIRECTED. TAKE 1 HOUR PRIOR TO DENTAL VISITS.   Marland Kitchen apixaban (ELIQUIS) 2.5 MG TABS tablet Take 1 tablet (2.5 mg total) by mouth 2 (two) times daily.   Marland Kitchen atorvastatin (LIPITOR) 80 MG tablet Take 0.5 tablets (40 mg total) by mouth at bedtime. 05/12/2020: Has the 40mg  tablet  . Blood Glucose Monitoring Suppl (CONTOUR NEXT EZ) w/Device KIT 1 kit by Does not apply route daily. Use once a Maki Hege to check blood sugar.  Dx code: E11.9   . glucose blood (CONTOUR NEXT TEST) test strip Use once a Mellonie Guess to check blood sugar.  Dx code: E11.9   . glyBURIDE (DIABETA) 5 MG tablet TAKE 1/2 TABLET (2.5 MG TOTAL) BY MOUTH 2 (TWO) TIMES DAILY WITH A MEAL. (Patient taking differently: Take 1/2 tab before breakfast) 05/12/2020: Taking 1/2 tab once daily with  evening meal  . losartan (COZAAR) 100 MG tablet Take 0.5 tablets (50 mg total) by mouth 2 (two) times daily.   . metFORMIN (GLUCOPHAGE) 500 MG tablet TAKE 2 TABLETS BY MOUTH EVERY MORNING THEN TAKE 1 TABLET BY MOUTH EVERY EVENING   . spironolactone (ALDACTONE) 25 MG tablet Take 0.5 tablets (12.5 mg total) by mouth daily. (Patient taking differently: Take 12.5 mg by mouth at bedtime. )    No facility-administered encounter medications on file as of 06/08/2020.     Current Diagnosis/Assessment:    Goals Addressed            This Visit's Progress   . Chronic Care Management Pharmacy Care Plan       CARE PLAN ENTRY (see longitudinal plan of care for additional care plan information)  Current Barriers:  . Chronic Disease Management support, education, and care coordination needs related to Hypertension, Hyperlipidemia, Diabetes, Atrial Fibrillation and Coronary Artery Disease    Hypertension BP Readings from Last 3 Encounters:  01/27/20 (!) 156/62  10/01/19 (!) 152/82  07/29/19 (!) 160/66   . Pharmacist Clinical Goal(s): o Over the next 90 days, patient will work with PharmD and providers to achieve BP goal <140/90 . Current regimen:  . Amlodipine 5 mg daily AM before breakfast . Losartan 100 mg 1/2 tab BID  . Spironolactone 25 mg 1/2 tab daily AM . Interventions: o Requested patient to record BP .  Patient self care activities - Over the next 90 days, patient will: o Check BP 1-2 times per week, document, and provide at future appointments o Ensure daily salt intake < 2300 mg/[place]  Hyperlipidemia Lab Results  Component Value Date/Time   LDLCALC 72 07/29/2019 08:27 AM   . Pharmacist Clinical Goal(s): o Over the next 90 days, patient will work with PharmD and providers to achieve LDL goal < 70 . Current regimen:  o Atorvastatin 40 mg daily . Interventions: o Discussed importance of diet and reducing consumption of cholesterol . Patient self care activities - Over the  next 90 days, patient will: o Reduce consumption of cholesterol  Diabetes Lab Results  Component Value Date/Time   HGBA1C 8.4 (H) 10/01/2019 09:48 AM   HGBA1C 8.2 (H) 05/29/2019 08:16 AM   . Pharmacist Clinical Goal(s): o Over the next 90 days, patient will work with PharmD and providers to achieve A1c goal <8% . Current regimen:  . Glyburide 5 mg 1/2 tab BID (only taking 1/2 tab once daily with evening meal) . Metformin 500 mg 2 tab AM, 1 tab PM . Interventions: o Requested patient to check blood sugar 2 hours after meal a few times per week to assess if post prandial blood sugar is above goal o Discussed DM goals o Move 1/2 tab of glyburide to before breakfast vs evening meal . Patient self care activities - Over the next 90 days, patient will: o Check blood sugar once daily, document, and provide at future appointments o Contact provider with any episodes of hypoglycemia  Health Maintenance  . Pharmacist Clinical Goal(s) o Over the next 90 days, patient will work with PharmD and providers to complete health maintenance screenings/vaccinations . Interventions: o Consider completing Shingrix vaccine series . Patient self care activities - Over the next 90 days, patient will: o Complete Shingrix vaccine series   Medication management . Pharmacist Clinical Goal(s): o Over the next 90 days, patient will work with PharmD and providers to maintain optimal medication adherence . Current pharmacy: [facility] . Interventions o Comprehensive medication review performed. o Continue current medication management strategy . Patient self care activities - Over the next 90 days, patient will: o Focus on medication adherence by filling and taking medications appropriately  o Take medications as prescribed o Report any questions or concerns to PharmD and/or provider(s)  Please see past updates related to this goal by clicking on the "Past Updates" button in the selected  goal       Social Hx: Moved here from [place] to be closer to their kids.  Moved here 6 years ago. Only community outside of children is church, but this has been put on hold due to [place]. Married 65 years.  Has 2 sons and 2 granddaughters.   Also seen by provider at [facility]   Hypertension   CMP Latest Ref Rng & Units 01/27/2020 10/01/2019 07/29/2019  Glucose 65 - 99 mg/dL 244(H) 282(H) 189(H)  BUN 8 - 27 mg/dL 23 30(H) 26  Creatinine 0.76 - 1.27 mg/dL 1.63(H) 1.44 1.29(H)  Sodium 134 - 144 mmol/L 135 136 138  Potassium 3.5 - 5.2 mmol/L 5.0 4.4 4.9  Chloride 96 - 106 mmol/L 98 101 100  CO2 20 - 29 mmol/L '23 25 25  '$ Calcium 8.6 - 10.2 mg/dL 9.3 9.0 9.2  Total Protein 6.0 - 8.5 g/dL 6.5 - 6.3  Total Bilirubin 0.0 - 1.2 mg/dL 0.4 - 0.4  Alkaline Phos 39 - 117 IU/L 73 -  64  AST 0 - 40 IU/L 28 - 28  ALT 0 - 44 IU/L 39 - 45(H)   Kidney Function Lab Results  Component Value Date/Time   CREATININE 1.63 (H) 01/27/2020 08:22 AM   CREATININE 1.44 10/01/2019 09:48 AM   CREATININE 1.02 07/08/2014 11:18 AM   GFR 46.56 (L) 10/01/2019 09:48 AM   GFRNONAA 38 (L) 01/27/2020 08:22 AM   GFRNONAA 69 07/08/2014 11:18 AM   GFRAA 44 (L) 01/27/2020 08:22 AM   GFRAA 80 07/08/2014 11:18 AM   K 5.0 01/27/2020 08:22 AM   K 4.4 10/01/2019 09:48 AM   BP goal is:  <140/90  Office blood pressures are  BP Readings from Last 3 Encounters:  01/27/20 (!) 156/62  10/01/19 (!) 152/82  07/29/19 (!) 160/66   Patient checks BP at home daily Patient home BP readings are ranging: 130s-140s/60s heart rate usually runs per patient report Feels his BP are lower in the evening vs the morning when his BP is checked during office visits  Patient has failed these meds in the past: None noted  Patient is currently borderline controlled per home BP on the following medications:  . Amlodipine 5 mg daily AM before breakfast . Losartan 100 mg 1/2 tab BID  . Spironolactone 25 mg 1/2 tab daily AM  Patient checked BP  while on phone. BP was 138/56 with pulse of 59. Patient checked BG while on 176 at 10:55am (breakfast was at 7am) We discussed Blood pressure goal   Update 06/08/20 States his Cr was 1.5 at [facility] Visit  Patient has kept an excellent record of his home BP as noted below.  '145 60 61 140 58 59 153 59 54 139 54 52 138 64 49 145 60 58 133 55 59 144 75 80 124 ''56 50 124 72 77 154 61 63 132 62 49 131 62 56 137 62 66 147 74 81 139 59 54 140 58 ''55 143 58 63 132 62 62 139 57 58 144 61 47 153 60 50 158 57 55 138 61 56 148 64 56 ''140 60 50 136 68 51 146 60 60 142 '$ 59 52 136 60 59 Average 140.6 61.2 58.0  Plan -Continue current medications    Diabetes   A1c goal <8% (considering age and comorbidities) FBG goal 90-150 PPBG goal <180  Recent Relevant Labs: Lab Results  Component Value Date/Time   HGBA1C 8.4 (H) 10/01/2019 09:48 AM   HGBA1C 8.2 (H) 05/29/2019 08:16 AM   GFR 46.56 (L) 10/01/2019 09:48 AM   GFR 44.79 (L) 05/29/2019 08:16 AM    Last diabetic Eye exam:  Lab Results  Component Value Date/Time   HMDIABEYEEXA No Retinopathy 05/20/2015 12:00 AM    Last diabetic Foot exam: No results found for: HMDIABFOOTEX   Checking BG: Daily  Recent FBG Readings: States he has a new meter  103  85  87  74  85  83  121 had a big dinner night before 69  68  113  87  76  126 had a big dinner night before 85  85  80 Average  89.1875  Patient has failed these meds in past: Januvia (cost) Patient is currently uncontrolled based on a1c, but controlled per FBG on the following medications: . Glyburide 5 mg 1/2 tab BID (only taking 1/2 tab once daily with evening meal) . Metformin 500 mg 2 tab AM, 1 tab PM  States he moved glyburide dose to PM vs AM due to  having hypoglycemia in the middle of the night when he took the the dose in the AM.  Feels hypoglycemia symptoms when his BG is less than 60. Treats hypoglycemia with glass of milk  Diet B - cereal and  milk mostly sometimes egg and bacon L - fish, chicken, salads, sweet potato fries (largest meal of the Nadeem Romanoski) D - rotisserie chicken wing, watermelon, cottage cheese Snacks - peanut butter crackers, celery sticks with peanut butter Drinks - water mainly, unsweet tea, diet coke, reduced sugar orange juice  Exercise Went to sports center pre-COVID [place] farm with his sons  Suspect pt's BG is above goal after meals or glucometer is not properly calibrated although he reports his meter is new and he is confident that it is accurate.   We discussed: diet and exercise extensively, DM goals, how his fasting BG are at goal, but there is a high probability that his post prandial blood sugars are above goal noting his a1c and glucose levels from most recent cmp.   Update 06/08/20 He has kept an excellent record of his post prandial BG and BPs Reports his a1c was 8.5% when he got checked at [facility].  As suspected, his post prandial BG are above goal while his fastings are controlled to too low.  This is most likely as a result of taking glyburide with evening meal (smallest meal) vs his largest meal (breakfast). Will plan to move glyburide to largest meal of the Gearline Spilman with consideration of adding additional half tab of glyburide to lunch as well.  Fasting 2 hrs after breakfast 2 hrs after lunch 2 hrs after dinner     247      184    187       136    82         221     231       133    208      243       389  Chicken strips, onion rings, strawberry milkshake  73         167      166    197      201      266   cereal, blueberries, peaches  69      47    sweaty clammy, at 12am   166       237     277      199        151   Avg      67.7 216 248.2 181.2    Plan -Move glyburide 1/2 tab to before breakfast versus before dinner -Continue current medications   Future Plan -Consider taking 1/2 tab of glyburide with lunch (when he knows he is eating a carb dense meal)   Miscellaneous  Meds  Prevident 5000 at night (recommended by dentist)

## 2020-06-11 NOTE — Patient Instructions (Signed)
Visit Information  Goals Addressed            This Visit's Progress    Chronic Care Management Pharmacy Care Plan       CARE PLAN ENTRY (see longitudinal plan of care for additional care plan information)  Current Barriers:   Chronic Disease Management support, education, and care coordination needs related to Hypertension, Hyperlipidemia, Diabetes, Atrial Fibrillation and Coronary Artery Disease    Hypertension BP Readings from Last 3 Encounters:  01/27/20 (!) 156/62  10/01/19 (!) 152/82  07/29/19 (!) 160/66    Pharmacist Clinical Goal(s): o Over the next 90 days, patient will work with PharmD and providers to achieve BP goal <140/90  Current regimen:   Amlodipine 5 mg daily AM before breakfast  Losartan 100 mg 1/2 tab BID   Spironolactone 25 mg 1/2 tab daily AM  Interventions: o Requested patient to record BP  Patient self care activities - Over the next 90 days, patient will: o Check BP 1-2 times per week, document, and provide at future appointments o Ensure daily salt intake < 2300 mg/Veto [place]queen  Hyperlipidemia Lab Results  Component Value Date/Time   LDLCALC 72 07/29/2019 08:27 AM    Pharmacist Clinical Goal(s): o Over the next 90 days, patient will work with PharmD and providers to achieve LDL goal < 70  Current regimen:  o Atorvastatin 40 mg daily  Interventions: o Discussed importance of diet and reducing consumption of cholesterol  Patient self care activities - Over the next 90 days, patient will: o Reduce consumption of cholesterol  Diabetes Lab Results  Component Value Date/Time   HGBA1C 8.4 (H) 10/01/2019 09:48 AM   HGBA1C 8.2 (H) 05/29/2019 08:16 AM    Pharmacist Clinical Goal(s): o Over the next 90 days, patient will work with PharmD and providers to achieve A1c goal <8%  Current regimen:   Glyburide 5 mg 1/2 tab BID (only taking 1/2 tab once daily with evening meal)  Metformin 500 mg 2 tab AM, 1 tab PM  Interventions: o Requested  patient to check blood sugar 2 hours after meal a few times per week to assess if post prandial blood sugar is above goal o Discussed DM goals o Move 1/2 tab of glyburide to before breakfast vs evening meal  Patient self care activities - Over the next 90 days, patient will: o Check blood sugar once daily, document, and provide at future appointments o Contact provider with any episodes of hypoglycemia  Health Maintenance   Pharmacist Clinical Goal(s) o Over the next 90 days, patient will work with PharmD and providers to complete health maintenance screenings/vaccinations  Interventions: o Consider completing Shingrix vaccine series  Patient self care activities - Over the next 90 days, patient will: o Complete Shingrix vaccine series   Medication management  Pharmacist Clinical Goal(s): o Over the next 90 days, patient will work with PharmD and providers to maintain optimal medication adherence  Current pharmacy: [facility]  Interventions o Comprehensive medication review performed. o Continue current medication management strategy  Patient self care activities - Over the next 90 days, patient will: o Focus on medication adherence by filling and taking medications appropriately  o Take medications as prescribed o Report any questions or concerns to PharmD and/or provider(s)  Please see past updates related to this goal by clicking on the "Past Updates" button in the selected goal         The patient verbalized understanding of instructions provided today and agreed to  receive a mailed copy of patient instruction and/or educational materials.  Telephone follow up appointment with pharmacy team member scheduled for: 07/06/2020  Melvenia Beam Teagan Heidrick, PharmD Clinical Pharmacist [facility] [phone]

## 2020-06-13 ENCOUNTER — Other Ambulatory Visit: Payer: Self-pay

## 2020-06-13 DIAGNOSIS — E119 Type 2 diabetes mellitus without complications: Secondary | ICD-10-CM

## 2020-06-13 DIAGNOSIS — I2510 Atherosclerotic heart disease of native coronary artery without angina pectoris: Secondary | ICD-10-CM

## 2020-06-13 DIAGNOSIS — I2583 Coronary atherosclerosis due to lipid rich plaque: Secondary | ICD-10-CM

## 2020-06-13 DIAGNOSIS — E785 Hyperlipidemia, unspecified: Secondary | ICD-10-CM

## 2020-06-17 ENCOUNTER — Encounter: Payer: Self-pay | Admitting: Family Medicine

## 2020-06-21 NOTE — Progress Notes (Signed)
I connected with [place] today by telephone and verified that I am speaking with the correct person using two identifiers. Location patient: home Location provider: work Persons participating in the virtual visit: patient, Marine scientist.    I discussed the limitations, risks, security and privacy concerns of performing an evaluation and management service by telephone and the availability of in person appointments. I also discussed with the patient that there may be a patient responsible charge related to this service. The patient expressed understanding and verbally consented to this telephonic visit.    Interactive audio and video telecommunications were attempted between this provider and patient, however failed, due to patient having technical difficulties OR patient did not have access to video capability.  We continued and completed visit with audio only.  Some vital signs may be absent or patient reported.    Subjective:   Peter Scott is a 86 y.o. male who presents for Medicare Annual/Subsequent preventive examination.  Review of Systems    Cardiac Risk Factors include: advanced age (>93mn, >>3women);diabetes mellitus;dyslipidemia;hypertension;male gender     Objective:     Advanced Directives 06/22/2020 08/27/2018 07/14/2018 04/01/2018 03/28/2018 03/26/2018 03/10/2018  Does Patient Have a Medical Advance Directive? Yes No Yes Yes Yes Yes No  Type of AParamedicof [place]rat[place]Living will - [place] will Healthcare Power of [place]Living will [place]Living will -  Does patient want to make changes to medical advance directive? No - Patient declined - - No - Patient declined No - Patient declined No - Patient declined -  Copy of [place]in Chart? Yes - validated most recent copy scanned in chart (See row information) - No - copy requested No - copy requested No - copy requested  No - copy requested -  Would patient like information on creating a medical advance directive? - No - Patient declined - - No - Patient declined - No - Patient declined    Current Medications (verified) Outpatient Encounter Medications as of 06/22/2020  Medication Sig   acetaminophen (TYLENOL) 500 MG tablet Take 1 tablet (500 mg total) by mouth every 6 (six) hours as needed. (Patient taking differently: Take 500 mg by mouth every 6 (six) hours as needed for moderate pain or headache. )   amiodarone (PACERONE) 200 MG tablet Take 1 tablet (200 mg total) by mouth daily.   amLODipine (NORVASC) 5 MG tablet Take 1 tablet (5 mg total) by mouth daily.   amoxicillin (AMOXIL) 500 MG capsule TAKE 4 CAPSULES (2,000 MG TOTAL) BY MOUTH AS DIRECTED. TAKE 1 HOUR PRIOR TO DENTAL VISITS.   apixaban (ELIQUIS) 2.5 MG TABS tablet Take 1 tablet (2.5 mg total) by mouth 2 (two) times daily.   atorvastatin (LIPITOR) 80 MG tablet Take 0.5 tablets (40 mg total) by mouth at bedtime.   Blood Glucose Monitoring Suppl (CONTOUR NEXT EZ) w/Device KIT 1 kit by Does not apply route daily. Use once a day to check blood sugar.  Dx code: E11.9   glucose blood (CONTOUR NEXT TEST) test strip Use once a day to check blood sugar.  Dx code: E11.9   glyBURIDE (DIABETA) 5 MG tablet TAKE 1/2 TABLET (2.5 MG TOTAL) BY MOUTH 2 (TWO) TIMES DAILY WITH A MEAL. (Patient taking differently: Take 1/2 tab before breakfast)   losartan (COZAAR) 100 MG tablet Take 0.5 tablets (50 mg total) by mouth 2 (two) times daily.   metFORMIN (GLUCOPHAGE) 500 MG tablet TAKE  2 TABLETS BY MOUTH EVERY MORNING THEN TAKE 1 TABLET BY MOUTH EVERY EVENING   spironolactone (ALDACTONE) 25 MG tablet Take 0.5 tablets (12.5 mg total) by mouth daily. (Patient taking differently: Take 12.5 mg by mouth at bedtime. )   No facility-administered encounter medications on file as of 06/22/2020.    Allergies (verified) Glipizide   History: Past Medical History:  Diagnosis  Date   Aortic aneurysm (HCC)    Aortic stenosis    CAD (coronary artery disease)    Diabetes mellitus type II, controlled ([place])    Heart disease    Ablation   History of chicken pox    Hyperlipidemia    Hypertension    S/P CABG x 4    2001 - 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   26 mm Edwards Sapien 3 transcatheter heart valve placed via percutaneous right transfemoral approach    Umbilical hernia    Past Surgical History:  Procedure Laterality Date   ABLATION     Rhythm problem; rhythm unknown; [place]   CARDIOVERSION N/A 07/14/2018   Procedure: CARDIOVERSION;  Surgeon: Dorothy Spark, MD;  Location: [facility];  Service: Cardiovascular;  Laterality: N/A;   CARDIOVERSION N/A 08/27/2018   Procedure: CARDIOVERSION;  Surgeon: Jerline Pain, MD;  Location: [facility];  Service: Cardiovascular;  Laterality: N/A;   CORONARY ARTERY BYPASS GRAFT  2001   EXPLORATION POST OPERATIVE OPEN HEART  2001, June   INTRAOPERATIVE TRANSTHORACIC ECHOCARDIOGRAM N/A 04/01/2018   Procedure: INTRAOPERATIVE TRANSTHORACIC ECHOCARDIOGRAM;  Surgeon: Burnell Blanks, MD;  Location: [facility];  Service: Open Heart Surgery;  Laterality: N/A;   RIGHT/LEFT HEART CATH AND CORONARY/GRAFT ANGIOGRAPHY N/A 01/31/2018   Procedure: RIGHT/LEFT HEART CATH AND CORONARY/GRAFT ANGIOGRAPHY;  Surgeon: Burnell Blanks, MD;  Location: [facility];  Service: Cardiovascular;  Laterality: N/A;   TEE WITHOUT CARDIOVERSION N/A 01/17/2018   Procedure: TRANSESOPHAGEAL ECHOCARDIOGRAM (TEE);  Surgeon: Lelon Perla, MD;  Location: [facility];  Service: Cardiovascular;  Laterality: N/A;   TEE WITHOUT CARDIOVERSION N/A 07/14/2018   Procedure: TRANSESOPHAGEAL ECHOCARDIOGRAM (TEE);  Surgeon: Dorothy Spark, MD;  Location: [facility];  Service: Cardiovascular;  Laterality: N/A;   TONSILLECTOMY     TRANSCATHETER AORTIC VALVE REPLACEMENT,  TRANSFEMORAL N/A 04/01/2018   Procedure: TRANSCATHETER AORTIC VALVE REPLACEMENT, TRANSFEMORAL;  Surgeon: Burnell Blanks, MD;  Location: [facility];  Service: Open Heart Surgery;  Laterality: N/A;   UMBILICAL HERNIA REPAIR  2015   Family History  Problem Relation Age of Onset   Diabetes Mother 12       Deceased   Heart disease Father 82       Deceased   Heart disease Maternal Uncle    Diabetes Maternal Uncle    Heart disease Brother    Diabetes Brother    Diabetes Sister        #1   Heart disease Sister        #2   Hyperlipidemia Son        x2   Social History   Socioeconomic History   Marital status: Married    Spouse name: Not on file   Number of children: 2   Years of education: Not on file   Highest education level: Not on file  Occupational History   Not on file  Tobacco Use   Smoking status: Former Smoker    Packs/day: 1.00    Years: 3.00    Pack years: 3.00   Smokeless  tobacco: Never Used   Tobacco comment: quit tobbaco in his 75s  Vaping Use   Vaping Use: Never used  Substance and Sexual Activity   Alcohol use: No    Alcohol/week: 0.0 standard drinks   Drug use: No   Sexual activity: Not on file  Other Topics Concern   Not on file  Social History Narrative   Not on file   Social Determinants of Health   Financial Resource Strain: Low Risk    Difficulty of Paying Living Expenses: Not hard at all  Food Insecurity: No Food Insecurity   Worried About Programme researcher, broadcasting/film/video in the Last Year: Never true   Ran Out of Food in the Last Year: Never true  Transportation Needs: No Transportation Needs   Lack of Transportation (Medical): No   Lack of Transportation (Non-Medical): No  Physical Activity:    Days of Exercise per Week:    Minutes of Exercise per Session:   Stress:    Feeling of Stress :   Social Connections:    Frequency of Communication with Friends and Family:    Frequency of Social Gatherings with Friends  and Family:    Attends Religious Services:    Active Member of Clubs or Organizations:    Attends Banker Meetings:    Marital Status:     Tobacco Counseling Counseling given: Not Answered Comment: quit tobbaco in his 33s   Clinical Intake: Pain : No/denies pain     Activities of Daily Living In your present state of health, do you have any difficulty performing the following activities: 06/22/2020  Hearing? N  Vision? N  Difficulty concentrating or making decisions? N  Walking or climbing stairs? N  Dressing or bathing? N  Doing errands, shopping? N  Preparing Food and eating ? N  Using the Toilet? N  In the past six months, have you accidently leaked urine? N  Do you have problems with loss of bowel control? N  Managing your Medications? N  Managing your Finances? N  Housekeeping or managing your Housekeeping? N  Some recent data might be hidden    Patient Care Team: Copland, Gwenlyn Found, MD as PCP - General (Family Medicine) Hollar, Ronal Fear, MD as Referring Physician (Dermatology) Day, Dannielle Karvonen, [facility] as Pharmacist (Pharmacist)  Indicate any recent Medical Services you may have received from other than [facility] providers in the past year (date may be approximate).     Assessment:   This is a routine wellness examination for [place].  Dietary issues and exercise activities discussed: Current Exercise Habits: The patient does not participate in regular exercise at present, Exercise limited by: None identified Diet (meal preparation, eat out, water intake, caffeinated beverages, dairy products, fruits and vegetables): well balanced  Goals     Chronic Care Management Pharmacy Care Plan     CARE PLAN ENTRY (see longitudinal plan of care for additional care plan information)  Current Barriers:   Chronic Disease Management support, education, and care coordination needs related to Hypertension, Hyperlipidemia, Diabetes, Atrial Fibrillation and Coronary  Artery Disease    Hypertension BP Readings from Last 3 Encounters:  01/27/20 (!) 156/62  10/01/19 (!) 152/82  07/29/19 (!) 160/66    Pharmacist Clinical Goal(s): o Over the next 90 days, patient will work with PharmD and providers to achieve BP goal <140/90  Current regimen:   Amlodipine 5 mg daily AM before breakfast  Losartan 100 mg 1/2 tab BID   Spironolactone 25 mg 1/2 tab  daily AM  Interventions: o Requested patient to record BP  Patient self care activities - Over the next 90 days, patient will: o Check BP 1-2 times per week, document, and provide at future appointments o Ensure daily salt intake < 2300 mg/day  Hyperlipidemia Lab Results  Component Value Date/Time   LDLCALC 72 07/29/2019 08:27 AM    Pharmacist Clinical Goal(s): o Over the next 90 days, patient will work with PharmD and providers to achieve LDL goal < 70  Current regimen:  o Atorvastatin 40 mg daily  Interventions: o Discussed importance of diet and reducing consumption of cholesterol  Patient self care activities - Over the next 90 days, patient will: o Reduce consumption of cholesterol  Diabetes Lab Results  Component Value Date/Time   HGBA1C 8.4 (H) 10/01/2019 09:48 AM   HGBA1C 8.2 (H) 05/29/2019 08:16 AM    Pharmacist Clinical Goal(s): o Over the next 90 days, patient will work with PharmD and providers to achieve A1c goal <8%  Current regimen:   Glyburide 5 mg 1/2 tab BID (only taking 1/2 tab once daily with evening meal)  Metformin 500 mg 2 tab AM, 1 tab PM  Interventions: o Requested patient to check blood sugar 2 hours after meal a few times per week to assess if post prandial blood sugar is above goal o Discussed DM goals o Move 1/2 tab of glyburide to before breakfast vs evening meal  Patient self care activities - Over the next 90 days, patient will: o Check blood sugar once daily, document, and provide at future appointments o Contact provider with any episodes of  hypoglycemia  Health Maintenance   Pharmacist Clinical Goal(s) o Over the next 90 days, patient will work with PharmD and providers to complete health maintenance screenings/vaccinations  Interventions: o Consider completing Shingrix vaccine series  Patient self care activities - Over the next 90 days, patient will: o Complete Shingrix vaccine series   Medication management  Pharmacist Clinical Goal(s): o Over the next 90 days, patient will work with PharmD and providers to maintain optimal medication adherence  Current pharmacy: [facility]  Interventions o Comprehensive medication review performed. o Continue current medication management strategy  Patient self care activities - Over the next 90 days, patient will: o Focus on medication adherence by filling and taking medications appropriately  o Take medications as prescribed o Report any questions or concerns to PharmD and/or provider(s)  Please see past updates related to this goal by clicking on the "Past Updates" button in the selected goal        Depression Screen PHQ 2/9 Scores 06/22/2020 10/23/2017 10/29/2016 10/10/2015 10/04/2014 07/08/2014  PHQ - 2 Score 0 0 0 0 0 0    Fall Risk Fall Risk  06/22/2020 10/08/2018 10/30/2017 10/23/2017 06/10/2017  Falls in the past year? 0 0 No No No  Comment - Emmi Telephone Survey: data to providers prior to load - - Emmi Telephone Survey: data to providers prior to load  Number falls in past yr: 0 - - - -  Injury with Fall? 0 - - - -  Follow up Education provided;Falls prevention discussed - - - -   Lives w/ wife in 1 story home. Any stairs in or around the home? Yes  If so, are there any without handrails? No  Home free of loose throw rugs in walkways, pet beds, electrical cords, etc? Yes  Adequate lighting in your home to reduce risk of falls? Yes   ASSISTIVE  DEVICES UTILIZED TO PREVENT FALLS:  Life alert? No  Use of a cane, walker or w/c? No  Grab bars  in the bathroom? No  Shower chair or bench in shower? No  Elevated toilet seat or a handicapped toilet? No    Cognitive Function: Ad8 score reviewed for issues:  Issues making decisions: no  Less interest in hobbies / activities:no  Repeats questions, stories (family complaining):no  Trouble using ordinary gadgets (microwave, computer, phone):no  Forgets the month or year: no  Mismanaging finances: no  Remembering appts:no  Daily problems with thinking and/or memory:no Ad8 score is=0        Immunizations Immunization History  Administered Date(s) Administered   Fluad Quad(high Dose 65+) 08/12/2019   Influenza, High Dose Seasonal PF 08/25/2015, 09/02/2017, 08/25/2018   Influenza,inj,Quad PF,6+ Mos 09/01/2014, 08/27/2016   Moderna SARS-COVID-2 Vaccination 12/23/2019, 01/20/2020   Pneumococcal Conjugate-13 10/04/2014   Pneumococcal Polysaccharide-23 10/10/2015   Tdap 01/05/2015    TDAP status: Up to date Flu Vaccine status: Up to date Pneumococcal vaccine status: Up to date Covid-19 vaccine status: Completed vaccines   Screening Tests Health Maintenance  Topic Date Due   HEMOGLOBIN A1C  03/30/2020   INFLUENZA VACCINE  06/19/2020   FOOT EXAM  09/30/2020   OPHTHALMOLOGY EXAM  11/02/2020   TETANUS/TDAP  01/05/2025   COVID-19 Vaccine  Completed   PNA vac Low Risk Adult  Completed    Health Maintenance  Health Maintenance Due  Topic Date Due   HEMOGLOBIN A1C  03/30/2020   INFLUENZA VACCINE  06/19/2020    Colorectal cancer screening: No longer required.   Lung Cancer Screening: (Low Dose CT Chest recommended if Age 35-80 years, 30 pack-year currently smoking OR have quit w/in 15years.) does not qualify.    Additional Screening:  Vision Screening: Recommended annual ophthalmology exams for early detection of glaucoma and other disorders of the eye. Is the patient up to date with their annual eye exam?  Yes  Who is the provider or what is  the name of the office in which the patient attends annual eye exams? Done at the [facility]   Dental Screening: Recommended annual dental exams for proper oral hygiene  Community Resource Referral / Chronic Care Management: CRR required this visit?  No   CCM required this visit?  No      Plan:    Please schedule your next medicare wellness visit with me in 1 yr.  Continue to eat heart healthy diet (full of fruits, vegetables, whole grains, lean protein, water--limit salt, fat, and sugar intake) and increase physical activity as tolerated.  Continue doing brain stimulating activities (puzzles, reading, adult coloring books, staying active) to keep memory sharp.   I have personally reviewed and noted the following in the patients chart:    Medical and social history  Use of alcohol, tobacco or illicit drugs   Current medications and supplements  Functional ability and status  Nutritional status  Physical activity  Advanced directives  List of other physicians  Hospitalizations, surgeries, and ER visits in previous 12 months  Vitals  Screenings to include cognitive, depression, and falls  Referrals and appointments  In addition, I have reviewed and discussed with patient certain preventive protocols, quality metrics, and best practice recommendations. A written personalized care plan for preventive services as well as general preventive health recommendations were provided to patient.   Due to this being a telephonic visit, the after visit summary with patients personalized plan was offered to patient  via mail or my-chart.  Patient would like to access on my-chart.   Shela Nevin, [place]   06/22/2020

## 2020-06-22 ENCOUNTER — Encounter: Payer: Self-pay | Admitting: *Deleted

## 2020-06-22 ENCOUNTER — Other Ambulatory Visit: Payer: Self-pay

## 2020-06-22 ENCOUNTER — Ambulatory Visit (INDEPENDENT_AMBULATORY_CARE_PROVIDER_SITE_OTHER): Payer: Medicare Other | Admitting: *Deleted

## 2020-06-22 DIAGNOSIS — Z0000 Encounter for general adult medical examination without abnormal findings: Secondary | ICD-10-CM

## 2020-06-22 NOTE — Patient Instructions (Signed)
Please schedule your next medicare wellness visit with me in 1 yr.  Continue to eat heart healthy diet (full of fruits, vegetables, whole grains, lean protein, water--limit salt, fat, and sugar intake) and increase physical activity as tolerated.  Continue doing brain stimulating activities (puzzles, reading, adult coloring books, staying active) to keep memory sharp.    Peter Scott , Thank you for taking time to come for your Medicare Wellness Visit. I appreciate your ongoing commitment to your health goals. Please review the following plan we discussed and let me know if I can assist you in the future.   These are the goals we discussed: Goals    . Chronic Care Management Pharmacy Care Plan     CARE PLAN ENTRY (see longitudinal plan of care for additional care plan information)  Current Barriers:  . Chronic Disease Management support, education, and care coordination needs related to Hypertension, Hyperlipidemia, Diabetes, Atrial Fibrillation and Coronary Artery Disease    Hypertension BP Readings from Last 3 Encounters:  01/27/20 (!) 156/62  10/01/19 (!) 152/82  07/29/19 (!) 160/66   . Pharmacist Clinical Goal(s): o Over the next 90 days, patient will work with PharmD and providers to achieve BP goal <140/90 . Current regimen:  . Amlodipine 5 mg daily AM before breakfast . Losartan 100 mg 1/2 tab BID  . Spironolactone 25 mg 1/2 tab daily AM . Interventions: o Requested patient to record BP . Patient self care activities - Over the next 90 days, patient will: o Check BP 1-2 times per week, document, and provide at future appointments o Ensure daily salt intake < 2300 mg/day  Hyperlipidemia Lab Results  Component Value Date/Time   LDLCALC 72 07/29/2019 08:27 AM   . Pharmacist Clinical Goal(s): o Over the next 90 days, patient will work with PharmD and providers to achieve LDL goal < 70 . Current regimen:  o Atorvastatin 40 mg daily . Interventions: o Discussed  importance of diet and reducing consumption of cholesterol . Patient self care activities - Over the next 90 days, patient will: o Reduce consumption of cholesterol  Diabetes Lab Results  Component Value Date/Time   HGBA1C 8.4 (H) 10/01/2019 09:48 AM   HGBA1C 8.2 (H) 05/29/2019 08:16 AM   . Pharmacist Clinical Goal(s): o Over the next 90 days, patient will work with PharmD and providers to achieve A1c goal <8% . Current regimen:  . Glyburide 5 mg 1/2 tab BID (only taking 1/2 tab once daily with evening meal) . Metformin 500 mg 2 tab AM, 1 tab PM . Interventions: o Requested patient to check blood sugar 2 hours after meal a few times per week to assess if post prandial blood sugar is above goal o Discussed DM goals o Move 1/2 tab of glyburide to before breakfast vs evening meal . Patient self care activities - Over the next 90 days, patient will: o Check blood sugar once daily, document, and provide at future appointments o Contact provider with any episodes of hypoglycemia  Health Maintenance  . Pharmacist Clinical Goal(s) o Over the next 90 days, patient will work with PharmD and providers to complete health maintenance screenings/vaccinations . Interventions: o Consider completing Shingrix vaccine series . Patient self care activities - Over the next 90 days, patient will: o Complete Shingrix vaccine series   Medication management . Pharmacist Clinical Goal(s): o Over the next 90 days, patient will work with PharmD and providers to maintain optimal medication adherence . Current pharmacy: [facility] .  Interventions o Comprehensive medication review performed. o Continue current medication management strategy . Patient self care activities - Over the next 90 days, patient will: o Focus on medication adherence by filling and taking medications appropriately  o Take medications as prescribed o Report any questions or concerns to PharmD and/or  provider(s)  Please see past updates related to this goal by clicking on the "Past Updates" button in the selected goal      . maintain healthy lifestyle.    . Patient Stated       This is a list of the screening recommended for you and due dates:  Health Maintenance  Topic Date Due  . Hemoglobin A1C  03/30/2020  . Flu Shot  06/19/2020  . Complete foot exam   09/30/2020  . Eye exam for diabetics  11/02/2020  . Tetanus Vaccine  01/05/2025  . COVID-19 Vaccine  Completed  . Pneumonia vaccines  Completed    Preventive Care 22 Years and Older, Male Preventive care refers to lifestyle choices and visits with your health care provider that can promote health and wellness. This includes:  A yearly physical exam. This is also called an annual well check.  Regular dental and eye exams.  Immunizations.  Screening for certain conditions.  Healthy lifestyle choices, such as diet and exercise. What can I expect for my preventive care visit? Physical exam Your health care provider will check:  Height and weight. These may be used to calculate body mass index (BMI), which is a measurement that tells if you are at a healthy weight.  Heart rate and blood pressure.  Your skin for abnormal spots. Counseling Your health care provider may ask you questions about:  Alcohol, tobacco, and drug use.  Emotional well-being.  Home and relationship well-being.  Sexual activity.  Eating habits.  History of falls.  Memory and ability to understand (cognition).  Work and work Statistician. What immunizations do I need?  Influenza (flu) vaccine  This is recommended every year. Tetanus, diphtheria, and pertussis (Tdap) vaccine  You may need a Td booster every 10 years. Varicella (chickenpox) vaccine  You may need this vaccine if you have not already been vaccinated. Zoster (shingles) vaccine  You may need this after age 95. Pneumococcal conjugate (PCV13) vaccine  One dose is  recommended after age 80. Pneumococcal polysaccharide (PPSV23) vaccine  One dose is recommended after age 87. Measles, mumps, and rubella (MMR) vaccine  You may need at least one dose of MMR if you were born in 1957 or later. You may also need a second dose. Meningococcal conjugate (MenACWY) vaccine  You may need this if you have certain conditions. Hepatitis A vaccine  You may need this if you have certain conditions or if you travel or work in places where you may be exposed to hepatitis A. Hepatitis B vaccine  You may need this if you have certain conditions or if you travel or work in places where you may be exposed to hepatitis B. Haemophilus influenzae type b (Hib) vaccine  You may need this if you have certain conditions. You may receive vaccines as individual doses or as more than one vaccine together in one shot (combination vaccines). Talk with your health care provider about the risks and benefits of combination vaccines. What tests do I need? Blood tests  Lipid and cholesterol levels. These may be checked every 5 years, or more frequently depending on your overall health.  Hepatitis C test.  Hepatitis B test. Screening  Lung cancer screening. You may have this screening every year starting at age 52 if you have a 30-pack-year history of smoking and currently smoke or have quit within the past 15 years.  Colorectal cancer screening. All adults should have this screening starting at age 51 and continuing until age 49. Your health care provider may recommend screening at age 81 if you are at increased risk. You will have tests every 1-10 years, depending on your results and the type of screening test.  Prostate cancer screening. Recommendations will vary depending on your family history and other risks.  Diabetes screening. This is done by checking your blood sugar (glucose) after you have not eaten for a while (fasting). You may have this done every 1-3  years.  Abdominal aortic aneurysm (AAA) screening. You may need this if you are a current or former smoker.  Sexually transmitted disease (STD) testing. Follow these instructions at home: Eating and drinking  Eat a diet that includes fresh fruits and vegetables, whole grains, lean protein, and low-fat dairy products. Limit your intake of foods with high amounts of sugar, saturated fats, and salt.  Take vitamin and mineral supplements as recommended by your health care provider.  Do not drink alcohol if your health care provider tells you not to drink.  If you drink alcohol: ? Limit how much you have to 0-2 drinks a day. ? Be aware of how much alcohol is in your drink. In the [place] one drink equals one 12 oz bottle of beer (355 mL), one 5 oz glass of wine (148 mL), or one 1 oz glass of hard liquor (44 mL). Lifestyle  Take daily care of your teeth and gums.  Stay active. Exercise for at least 30 minutes on 5 or more days each week.  Do not use any products that contain nicotine or tobacco, such as cigarettes, e-cigarettes, and chewing tobacco. If you need help quitting, ask your health care provider.  If you are sexually active, practice safe sex. Use a condom or other form of protection to prevent STIs (sexually transmitted infections).  Talk with your health care provider about taking a low-dose aspirin or statin. What's next?  Visit your health care provider once a year for a well check visit.  Ask your health care provider how often you should have your eyes and teeth checked.  Stay up to date on all vaccines. This information is not intended to replace advice given to you by your health care provider. Make sure you discuss any questions you have with your health care provider. Document Revised: 10/30/2018 Document Reviewed: 10/30/2018 Elsevier Patient Education  2020 [employer].

## 2020-07-06 ENCOUNTER — Ambulatory Visit: Payer: Medicare Other | Admitting: Pharmacist

## 2020-07-06 DIAGNOSIS — E119 Type 2 diabetes mellitus without complications: Secondary | ICD-10-CM

## 2020-07-06 DIAGNOSIS — I10 Essential (primary) hypertension: Secondary | ICD-10-CM

## 2020-07-06 NOTE — Chronic Care Management (AMB) (Signed)
Chronic Care Management Pharmacy  Name: Peter Scott  MRN: [id number] DOB: 1932-03-31   Chief Complaint/ HPI  Peter Scott,  86 y.o. , male presents for their Follow-Up CCM visit with the clinical pharmacist via telephone due to COVID-19 Pandemic.  PCP : [name], MD  Their chronic conditions include: Hypertension, Hyperlipidemia, Diabetes, Atrial Fibrillation and Coronary Artery Disease   Office Visits: 06/22/20: Medicare Annual Wellness Exam w/ Naaman Plummer, RN  Consult Visit: None since last CCM visit on 06/08/2020.  Allergies  Allergen Reactions  . Glipizide Palpitations    "Heart racing" "Heart racing"    Medications: Outpatient Encounter Medications as of 07/06/2020  Medication Sig Note  . acetaminophen (TYLENOL) 500 MG tablet Take 1 tablet (500 mg total) by mouth every 6 (six) hours as needed. (Patient taking differently: Take 500 mg by mouth every 6 (six) hours as needed for moderate pain or headache. )   . amiodarone (PACERONE) 200 MG tablet Take 1 tablet (200 mg total) by mouth daily.   Marland Kitchen amLODipine (NORVASC) 5 MG tablet Take 1 tablet (5 mg total) by mouth daily.   Marland Kitchen amoxicillin (AMOXIL) 500 MG capsule TAKE 4 CAPSULES (2,000 MG TOTAL) BY MOUTH AS DIRECTED. TAKE 1 HOUR PRIOR TO DENTAL VISITS.   Marland Kitchen apixaban (ELIQUIS) 2.5 MG TABS tablet Take 1 tablet (2.5 mg total) by mouth 2 (two) times daily.   Marland Kitchen atorvastatin (LIPITOR) 80 MG tablet Take 0.5 tablets (40 mg total) by mouth at bedtime. 05/12/2020: Has the 77m tablet  . Blood Glucose Monitoring Suppl (CONTOUR NEXT EZ) w/Device KIT 1 kit by Does not apply route daily. Use once a Seldon Barrell to check blood sugar.  Dx code: E11.9   . glucose blood (CONTOUR NEXT TEST) test strip Use once a Jeron Grahn to check blood sugar.  Dx code: E11.9   . glyBURIDE (DIABETA) 5 MG tablet TAKE 1/2 TABLET (2.5 MG TOTAL) BY MOUTH 2 (TWO) TIMES DAILY WITH A MEAL. (Patient taking differently: Take 1/2 tab before breakfast) 05/12/2020: Taking 1/2 tab  once daily with evening meal  . losartan (COZAAR) 100 MG tablet Take 0.5 tablets (50 mg total) by mouth 2 (two) times daily.   . metFORMIN (GLUCOPHAGE) 500 MG tablet TAKE 2 TABLETS BY MOUTH EVERY MORNING THEN TAKE 1 TABLET BY MOUTH EVERY EVENING   . spironolactone (ALDACTONE) 25 MG tablet Take 0.5 tablets (12.5 mg total) by mouth daily. (Patient taking differently: Take 12.5 mg by mouth at bedtime. )    No facility-administered encounter medications on file as of 07/06/2020.   SDOH Screenings   Alcohol Screen:   . Last Alcohol Screening Score (AUDIT):   Depression (PHQ2-9): Low Risk   . PHQ-2 Score: 0  Financial Resource Strain: Low Risk   . Difficulty of Paying Living Expenses: Not hard at all  Food Insecurity: No Food Insecurity  . Worried About RCharity fundraiserin the Last Year: Never true  . Ran Out of Food in the Last Year: Never true  Housing: Low Risk   . Last Housing Risk Score: 0  Physical Activity:   . Days of Exercise per Week:   . Minutes of Exercise per Session:   Social Connections:   . Frequency of Communication with Friends and Family:   . Frequency of Social Gatherings with Friends and Family:   . Attends Religious Services:   . Active Member of Clubs or Organizations:   . Attends CArchivistMeetings:   .Marland KitchenMarital  Status:   Stress:   . Feeling of Stress :   Tobacco Use: Medium Risk  . Smoking Tobacco Use: Former Smoker  . Smokeless Tobacco Use: Never Used  Transportation Needs: No Transportation Needs  . Lack of Transportation (Medical): No  . Lack of Transportation (Non-Medical): No     Current Diagnosis/Assessment:    Goals Addressed            This Visit's Progress   . Chronic Care Management Pharmacy Care Plan       CARE PLAN ENTRY (see longitudinal plan of care for additional care plan information)  Current Barriers:  . Chronic Disease Management support, education, and care coordination needs related to Hypertension,  Hyperlipidemia, Diabetes, Atrial Fibrillation and Coronary Artery Disease    Hypertension BP Readings from Last 3 Encounters:  01/27/20 (!) 156/62  10/01/19 (!) 152/82  07/29/19 (!) 160/66   . Pharmacist Clinical Goal(s): o Over the next 90 days, patient will work with PharmD and providers to maintain BP goal <140/90 . Current regimen:  . Amlodipine 5 mg daily AM before breakfast . Losartan 100 mg 1/2 tab BID  . Spironolactone 25 mg 1/2 tab daily AM . Interventions: o Requested patient to record BP . Patient self care activities - Over the next 90 days, patient will: o Check BP 1-2 times per week, document, and provide at future appointments o Ensure daily salt intake < 2300 mg/Seleny Allbright  Hyperlipidemia Lab Results  Component Value Date/Time   LDLCALC 72 07/29/2019 08:27 AM   . Pharmacist Clinical Goal(s): o Over the next 90 days, patient will work with PharmD and providers to achieve LDL goal < 70 . Current regimen:  o Atorvastatin 40 mg daily . Interventions: o Discussed importance of diet and reducing consumption of cholesterol . Patient self care activities - Over the next 90 days, patient will: o Reduce consumption of cholesterol  Diabetes Lab Results  Component Value Date/Time   HGBA1C 8.4 (H) 10/01/2019 09:48 AM   HGBA1C 8.2 (H) 05/29/2019 08:16 AM   . Pharmacist Clinical Goal(s): o Over the next 90 days, patient will work with PharmD and providers to achieve A1c goal <8% . Current regimen:  . Glyburide 5 mg 1/2 tab BID  . Metformin 500 mg 2 tab AM, 1 tab PM . Interventions: o Requested patient to check blood sugar 2 hours after meal a few times per week to assess if post prandial blood sugar is above goal o Discussed DM goals o Continue glyburide 1/2 tab with breakfast and dinner . Patient self care activities - Over the next 90 days, patient will: o Check blood sugar once daily, document, and provide at future appointments o Contact provider with any episodes of  hypoglycemia  Health Maintenance  . Pharmacist Clinical Goal(s) o Over the next 90 days, patient will work with PharmD and providers to complete health maintenance screenings/vaccinations . Interventions: o Consider completing Shingrix vaccine series (completed #1 or 2 vaccines) . Patient self care activities - Over the next 90 days, patient will: o Complete Shingrix vaccine series (on track; keep appt in Oct for #2 vaccine)  Medication management . Pharmacist Clinical Goal(s): o Over the next 90 days, patient will work with PharmD and providers to maintain optimal medication adherence . Current pharmacy: [facility] . Interventions o Comprehensive medication review performed. o Continue current medication management strategy . Patient self care activities - Over the next 90 days, patient will: o Focus on medication adherence by  filling and taking medications appropriately  o Take medications as prescribed o Report any questions or concerns to PharmD and/or provider(s)  Please see past updates related to this goal by clicking on the "Past Updates" button in the selected goal       Social Hx: Moved here from [place] to be closer to their kids.  Moved here 6 years ago. Only community outside of children is church, but this has been put on hold due to [place]. Married 65 years.  Has 2 sons and 2 granddaughters.   Also seen by provider at [facility]   Hypertension   CMP Latest Ref Rng & Units 01/27/2020 10/01/2019 07/29/2019  Glucose 65 - 99 mg/dL 244(H) 282(H) 189(H)  BUN 8 - 27 mg/dL 23 30(H) 26  Creatinine 0.76 - 1.27 mg/dL 1.63(H) 1.44 1.29(H)  Sodium 134 - 144 mmol/L 135 136 138  Potassium 3.5 - 5.2 mmol/L 5.0 4.4 4.9  Chloride 96 - 106 mmol/L 98 101 100  CO2 20 - 29 mmol/L _0 Calcium 8.6 - 10.2 mg/dL 9.3 9.0 9.2  Total Protein 6.0 - 8.5 g/dL 6.5 - 6.3  Total Bilirubin 0.0 - 1.2 mg/dL 0.4 - 0.4  Alkaline Phos 39 - 117 IU/L 73 - 64  AST 0 - 40 IU/L 28 -  28  ALT 0 - 44 IU/L 39 - 45(H)   Kidney Function Lab Results  Component Value Date/Time   CREATININE 1.63 (H) 01/27/2020 08:22 AM   CREATININE 1.44 10/01/2019 09:48 AM   CREATININE 1.02 07/08/2014 11:18 AM   GFR 46.56 (L) 10/01/2019 09:48 AM   GFRNONAA 38 (L) 01/27/2020 08:22 AM   GFRNONAA 69 07/08/2014 11:18 AM   GFRAA 44 (L) 01/27/2020 08:22 AM   GFRAA 80 07/08/2014 11:18 AM   K 5.0 01/27/2020 08:22 AM   K 4.4 10/01/2019 09:48 AM   BP goal is:  <140/90  Office blood pressures are  BP Readings from Last 3 Encounters:  01/27/20 (!) 156/62  10/01/19 (!) 152/82  07/29/19 (!) 160/66   Patient checks BP at home daily Patient home BP readings are ranging: 130s-140s/60s heart rate usually runs per patient report Feels his BP are lower in the evening vs the morning when his BP is checked during office visits  Patient has failed these meds in the past: None noted  Patient is currently borderline controlled per home BP on the following medications:  . Amlodipine 5 mg daily AM before breakfast . Losartan 100 mg 1/2 tab BID  . Spironolactone 25 mg 1/2 tab daily AM  Patient checked BP while on phone. BP was 138/56 with pulse of 59. Patient checked BG while on 176 at 10:55am (breakfast was at 7am) We discussed Blood pressure goal   Update 06/08/20 States his Cr was 1.5 at [facility]  Patient has kept an excellent record of his home BP as noted below.   BP Average 140.6 61.2 58.0  Update 8/18/_1 148 63 140 57 Avg: 138.2 57.5 He feels his BP is higher when he first wakes up in the morning.   Plan -Continue current medications    Diabetes   A1c goal <8% (considering age and comorbidities) FBG goal 90-150 PPBG goal <180  Recent Relevant Labs: Lab Results  Component Value Date/Time   HGBA1C 8.4 (H) 10/01/2019 09:48 AM   HGBA1C 8.2 (H)  05/29/2019 08:16 AM   GFR 46.56 (L)  10/01/2019 09:48 AM   GFR 44.79 (L) 05/29/2019 08:16 AM    Last diabetic Eye exam:  Lab Results  Component Value Date/Time   HMDIABEYEEXA No Retinopathy 05/20/2015 12:00 AM    Last diabetic Foot exam: No results found for: HMDIABFOOTEX   Checking BG: Daily  Recent FBG Readings: States he has a new meter  103  85  87  74  85  83  121 had a big dinner night before 69  68  113  87  76  126 had a big dinner night before 85  85  80 Average  89.1875  Patient has failed these meds in past: Januvia (cost) Patient is currently uncontrolled based on a1c, but controlled per FBG on the following medications: . Glyburide 5 mg 1/2 tab BID (only taking 1/2 tab once daily with evening meal) . Metformin 500 mg 2 tab AM, 1 tab PM  States he moved glyburide dose to PM vs AM due to having hypoglycemia in the middle of the night when he took the the dose in the AM.  Feels hypoglycemia symptoms when his BG is less than 60. 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   We discussed: diet and exercise extensively, DM goals, how his fasting BG are at goal, but there is a high probability that his post prandial blood sugars are above goal noting his a1c and glucose levels from most recent cmp.   Update 06/08/20 He has kept an excellent record of his post prandial BG and BPs Reports his a1c was 8.5% (05/27/20) when he got checked at [facility].  As  suspected, his post prandial BG are above goal while his fastings are controlled to too low.  This is most likely as a result of taking glyburide with evening meal (smallest meal) vs his largest meal (breakfast). Will plan to move glyburide to largest meal of the [place] with consideration of adding additional half tab of glyburide to lunch as well.  Fasting 2 hrs after breakfast 2 hrs after lunch 2 hrs after dinner  Avg      67.7 216 248.2 181.2    Update 07/06/20 Is taking glyburide as he is eating his food. Started second glyburide tab on 06/10/20 States he watching the volume of what he eats as well as what he eats. AB AL Ad _0 123 181 199 209 142 174 147 172  231   164  Avg 178.5 163.8  151 Congratulated patient on improved BG averages. He is hopeful his a1c will be improved at his next visit at [facility] or w/ Dr. [name]  Plan -Continue glyburide 1/2 tab with breakfast and dinner -Continue current medications    Vaccines   Reviewed and discussed patient's vaccination history.    Immunization History  Administered Date(s) Administered  . Fluad Quad(high Dose  65+) 08/12/2019  . Influenza, High Dose Seasonal PF 08/25/2015, 09/02/2017, 08/25/2018  . Influenza,inj,Quad PF,6+ Mos 09/01/2014, 08/27/2016  . Moderna SARS-COVID-2 Vaccination 12/23/2019, 01/20/2020  . Pneumococcal Conjugate-13 10/04/2014  . Pneumococcal Polysaccharide-23 10/10/2015  . Tdap 01/05/2015   Reports he got first Shingles shot on 05/27/20 at the [facility] back 08/26/20 for second Shingrix shot  Plan -Update Shingles vaccine record -Complete 2nd Shingrix vaccine at scheduled app[facility] at [facility]

## 2020-07-06 NOTE — Patient Instructions (Signed)
Visit Information  Goals Addressed            This Visit's Progress   . Chronic Care Management Pharmacy Care Plan       CARE PLAN ENTRY (see longitudinal plan of care for additional care plan information)  Current Barriers:  . Chronic Disease Management support, education, and care coordination needs related to Hypertension, Hyperlipidemia, Diabetes, Atrial Fibrillation and Coronary Artery Disease    Hypertension BP Readings from Last 3 Encounters:  01/27/20 (!) 156/62  10/01/19 (!) 152/82  07/29/19 (!) 160/66   . Pharmacist Clinical Goal(s): o Over the next 90 days, patient will work with PharmD and providers to maintain BP goal <140/90 . Current regimen:  . Amlodipine 5 mg daily AM before breakfast . Losartan 100 mg 1/2 tab BID  . Spironolactone 25 mg 1/2 tab daily AM . Interventions: o Requested patient to record BP . Patient self care activities - Over the next 90 days, patient will: o Check BP 1-2 times per week, document, and provide at future appointments o Ensure daily salt intake < 2300 mg/Peter Scott  Hyperlipidemia Lab Results  Component Value Date/Time   LDLCALC 72 07/29/2019 08:27 AM   . Pharmacist Clinical Goal(s): o Over the next 90 days, patient will work with PharmD and providers to achieve LDL goal < 70 . Current regimen:  o Atorvastatin 40 mg daily . Interventions: o Discussed importance of diet and reducing consumption of cholesterol . Patient self care activities - Over the next 90 days, patient will: o Reduce consumption of cholesterol  Diabetes Lab Results  Component Value Date/Time   HGBA1C 8.4 (H) 10/01/2019 09:48 AM   HGBA1C 8.2 (H) 05/29/2019 08:16 AM   . Pharmacist Clinical Goal(s): o Over the next 90 days, patient will work with PharmD and providers to achieve A1c goal <8% . Current regimen:  . Glyburide 5 mg 1/2 tab BID  . Metformin 500 mg 2 tab AM, 1 tab PM . Interventions: o Requested patient to check blood sugar 2 hours after meal a  few times per week to assess if post prandial blood sugar is above goal o Discussed DM goals o Continue glyburide 1/2 tab with breakfast and dinner . Patient self care activities - Over the next 90 days, patient will: o Check blood sugar once daily, document, and provide at future appointments o Contact provider with any episodes of hypoglycemia  Health Maintenance  . Pharmacist Clinical Goal(s) o Over the next 90 days, patient will work with PharmD and providers to complete health maintenance screenings/vaccinations . Interventions: o Consider completing Shingrix vaccine series (completed #1 or 2 vaccines) . Patient self care activities - Over the next 90 days, patient will: o Complete Shingrix vaccine series (on track; keep appt in Oct for #2 vaccine)  Medication management . Pharmacist Clinical Goal(s): o Over the next 90 days, patient will work with PharmD and providers to maintain optimal medication adherence . Current pharmacy: [facility] . Interventions o Comprehensive medication review performed. o Continue current medication management strategy . Patient self care activities - Over the next 90 days, patient will: o Focus on medication adherence by filling and taking medications appropriately  o Take medications as prescribed o Report any questions or concerns to PharmD and/or provider(s)  Please see past updates related to this goal by clicking on the "Past Updates" button in the selected goal         The patient verbalized understanding of instructions provided today and  agreed to receive a mailed copy of patient instruction and/or educational materials.  Telephone follow up appointment with pharmacy team member scheduled for: 09/07/2020  Peter Scott, PharmD Clinical Pharmacist [facility] [phone]

## 2020-07-18 MED FILL — METFORMIN HCL 500 MG TABS: 500 | 90 days supply | Qty: 270 | Fill #1

## 2020-07-28 ENCOUNTER — Encounter: Payer: Self-pay | Admitting: Family Medicine

## 2020-08-04 MED FILL — AMOXICILLIN 500 MG CAPSULE: 500 | 2 days supply | Qty: 8 | Fill #1

## 2020-08-12 ENCOUNTER — Ambulatory Visit (INDEPENDENT_AMBULATORY_CARE_PROVIDER_SITE_OTHER): Payer: Medicare Other | Admitting: *Deleted

## 2020-08-12 ENCOUNTER — Other Ambulatory Visit: Payer: Self-pay

## 2020-08-12 DIAGNOSIS — Z23 Encounter for immunization: Secondary | ICD-10-CM

## 2020-08-12 NOTE — Telephone Encounter (Signed)
I'm unsure of the request he is referring to. I now have assistants named Fanny Skates and [place] Shannon who are assisting me with checking in on patients, but I'm not aware of an "EMMI" request. I'll ask about this.

## 2020-08-12 NOTE — Progress Notes (Signed)
Patient her for high dose flu vaccine.    Vaccine given in left deltoid and patient tolerated well.

## 2020-08-16 NOTE — Progress Notes (Signed)
HPI: FU CAD andAS. Patient is status post coronary artery bypass graft in [place] in 2001. He also had ablation of what sounds to be SVT at that time. Carotid Dopplers March 2019 showed 1 to 39% bilateral stenosis. Patient had preoperative cardiac catheterization that revealed severe native three-vessel coronary artery disease with patency of grafts. He underwent TAVR 5/19.Also with h/o PAF. Abd ultrasound 2/21 showed no AAA. Echocardiogram April 2021 showed normal LV function, mild mitral regurgitation, previous TAVR with trivial perivalvular aortic insufficiency.  Since last seen,patient denies dyspnea, chest pain, palpitations or syncope.  Mild chronic pedal edema.  He also describes a nonproductive cough.  Current Outpatient Medications  Medication Sig Dispense Refill   acetaminophen (TYLENOL) 500 MG tablet Take 1 tablet (500 mg total) by mouth every 6 (six) hours as needed. (Patient taking differently: Take 500 mg by mouth every 6 (six) hours as needed for moderate pain or headache. ) 30 tablet 0   amiodarone (PACERONE) 200 MG tablet Take 1 tablet (200 mg total) by mouth daily. 90 tablet 3   amLODipine (NORVASC) 5 MG tablet Take 1 tablet (5 mg total) by mouth daily.     amoxicillin (AMOXIL) 500 MG capsule TAKE 4 CAPSULES (2,000 MG TOTAL) BY MOUTH AS DIRECTED. TAKE 1 HOUR PRIOR TO DENTAL VISITS. 8 capsule 6   apixaban (ELIQUIS) 2.5 MG TABS tablet Take 1 tablet (2.5 mg total) by mouth 2 (two) times daily.     atorvastatin (LIPITOR) 80 MG tablet Take 0.5 tablets (40 mg total) by mouth at bedtime. 90 tablet 1   Blood Glucose Monitoring Suppl (CONTOUR NEXT EZ) w/Device KIT 1 kit by Does not apply route daily. Use once a day to check blood sugar.  Dx code: E11.9 1 kit 0   glucose blood (CONTOUR NEXT TEST) test strip Use once a day to check blood sugar.  Dx code: E11.9 100 each 12   glyBURIDE (DIABETA) 5 MG tablet TAKE 1/2 TABLET (2.5 MG TOTAL) BY MOUTH 2 (TWO) TIMES DAILY WITH A  MEAL. (Patient taking differently: Take 1/2 tab before breakfast) 90 tablet 3   losartan (COZAAR) 100 MG tablet Take 0.5 tablets (50 mg total) by mouth 2 (two) times daily. 90 tablet 1   metFORMIN (GLUCOPHAGE) 500 MG tablet TAKE 2 TABLETS BY MOUTH EVERY MORNING THEN TAKE 1 TABLET BY MOUTH EVERY EVENING 270 tablet 1   spironolactone (ALDACTONE) 25 MG tablet Take 0.5 tablets (12.5 mg total) by mouth daily. (Patient taking differently: Take 12.5 mg by mouth at bedtime. ) 45 tablet 1   No current facility-administered medications for this visit.     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   26 mm Edwards Sapien 3 transcatheter heart valve placed via percutaneous right transfemoral approach    Umbilical hernia     Past Surgical History:  Procedure Laterality Date   ABLATION     Rhythm problem; rhythm unknown; [place]   CARDIOVERSION N/A 07/14/2018   Procedure: CARDIOVERSION;  Surgeon: Dorothy Spark, MD;  Location: [place];  Service: Cardiovascular;  Laterality:  N/A;   CARDIOVERSION N/A 08/27/2018   Procedure: CARDIOVERSION;  Surgeon: Jerline Pain, MD;  Location: [facility] ENDOSCOPY;  Service: Cardiovascular;  Laterality: N/A;   CORONARY ARTERY BYPASS GRAFT  2001   EXPLORATION POST OPERATIVE OPEN HEART  2001, June   INTRAOPERATIVE TRANSTHORACIC ECHOCARDIOGRAM N/A 04/01/2018   Procedure: INTRAOPERATIVE TRANSTHORACIC ECHOCARDIOGRAM;  Surgeon: Burnell Blanks, MD;  Location: [place];  Service: Open Heart Surgery;  Laterality: N/A;   RIGHT/LEFT HEART CATH AND CORONARY/GRAFT ANGIOGRAPHY N/A 01/31/2018   Procedure: RIGHT/LEFT HEART CATH AND  CORONARY/GRAFT ANGIOGRAPHY;  Surgeon: Burnell Blanks, MD;  Location: [place] CV LAB;  Service: Cardiovascular;  Laterality: N/A;   TEE WITHOUT CARDIOVERSION N/A 01/17/2018   Procedure: TRANSESOPHAGEAL ECHOCARDIOGRAM (TEE);  Surgeon: Lelon Perla, MD;  Location: [facility] ENDOSCOPY;  Service: Cardiovascular;  Laterality: N/A;   TEE WITHOUT CARDIOVERSION N/A 07/14/2018   Procedure: TRANSESOPHAGEAL ECHOCARDIOGRAM (TEE);  Surgeon: Dorothy Spark, MD;  Location: [place];  Service: Cardiovascular;  Laterality: N/A;   TONSILLECTOMY     TRANSCATHETER AORTIC VALVE REPLACEMENT, TRANSFEMORAL N/A 04/01/2018   Procedure: TRANSCATHETER AORTIC VALVE REPLACEMENT, TRANSFEMORAL;  Surgeon: Burnell Blanks, MD;  Location: [place];  Service: Open Heart Surgery;  Laterality: N/A;   UMBILICAL HERNIA REPAIR  2015    Social History   Socioeconomic History   Marital status: Married    Spouse name: Not on file   Number of children: 2   Years of education: Not on file   Highest education level: Not on file  Occupational History   Not on file  Tobacco Use   Smoking status: Former Smoker    Packs/day: 1.00    Years: 3.00    Pack years: 3.00   Smokeless tobacco: Never Used   Tobacco comment: quit tobbaco in his 53s  Vaping Use   Vaping Use: Never used  Substance and Sexual Activity   Alcohol use: No    Alcohol/week: 0.0 standard drinks   Drug use: No   Sexual activity: Not on file  Other Topics Concern   Not on file  Social History Narrative   Not on file   Social Determinants of Health   Financial Resource Strain: Low Risk    Difficulty of Paying Living Expenses: Not hard at all  Food Insecurity: No Food Insecurity   Worried About Charity fundraiser in the Last Year: Never true   [place] in the Last Year: Never true  Transportation Needs: No Transportation Needs   Lack of Transportation (Medical): No   Lack of Transportation (Non-Medical): No   Physical Activity:    Days of Exercise per Week: Not on file   Minutes of Exercise per Session: Not on file  Stress:    Feeling of Stress : Not on file  Social Connections:    Frequency of Communication with Friends and Family: Not on file   Frequency of Social Gatherings with Friends and Family: Not on file   Attends Religious Services: Not on file   Active Member of Clubs or Organizations: Not on file   Attends Archivist Meetings: Not on file   Marital Status: Not on file  Intimate Partner Violence:    Fear of Current or Ex-Partner: Not on file   Emotionally Abused: Not on file   Physically Abused: Not on file   Sexually Abused: Not on file    Family History  Problem Relation Age of Onset   Diabetes Mother 29  Deceased   Heart disease Father 30       Deceased   Heart disease Maternal Uncle    Diabetes Maternal Uncle    Heart disease Brother    Diabetes Brother    Diabetes Sister        #1   Heart disease Sister        #2   Hyperlipidemia Son        x2    ROS: Nonproductive cough but no fevers or chills, hemoptysis, dysphasia, odynophagia, melena, hematochezia, dysuria, hematuria, rash, seizure activity, orthopnea, PND, claudication. Remaining systems are negative.  Physical Exam: Well-developed well-nourished in no acute distress.  Skin is warm and dry.  HEENT is normal.  Neck is supple.  Chest is clear to auscultation with normal expansion.  Cardiovascular exam is regular rate and rhythm. 2/6 systolic murmur; no DM Abdominal exam nontender or distended. No masses palpated. Extremities show 1+ edema. neuro grossly intact  ECG-normal sinus rhythm with first-degree AV block, right bundle branch block, low voltage.  Personally reviewed  A/P  1 paroxysmal atrial fibrillation-patient remains in sinus rhythm.  Continue amiodarone and apixaban.  Check TSH, liver functions and chest x-ray.  Check hemoglobin and renal  function.  2 hypertension-blood pressure elevated; however typically controlled at home.  Continue present medications and follow.  3 hyperlipidemia-continue statin.  4 previous TAVR-continue SBE prophylaxis.  5 coronary artery disease status post coronary artery bypass graft-patient is not on aspirin given need for apixaban.  Continue statin.  6 question history of abdominal aortic aneurysm-not evident on most recent ultrasound.  Kirk Ruths, MD

## 2020-08-24 ENCOUNTER — Encounter: Payer: Self-pay | Admitting: Cardiology

## 2020-08-24 ENCOUNTER — Other Ambulatory Visit: Payer: Self-pay

## 2020-08-24 ENCOUNTER — Ambulatory Visit (HOSPITAL_BASED_OUTPATIENT_CLINIC_OR_DEPARTMENT_OTHER)
Admission: RE | Admit: 2020-08-24 | Discharge: 2020-08-24 | Disposition: A | Discharge status: Home / Self Care | Payer: Medicare Other | Source: Ambulatory Visit | Attending: Cardiology | Admitting: Cardiology

## 2020-08-24 ENCOUNTER — Ambulatory Visit (INDEPENDENT_AMBULATORY_CARE_PROVIDER_SITE_OTHER): Payer: Medicare Other | Admitting: Cardiology

## 2020-08-24 VITALS — BP 162/60 | HR 70 | Ht 69.0 in | Wt 171.1 lb

## 2020-08-24 DIAGNOSIS — I480 Paroxysmal atrial fibrillation: Secondary | ICD-10-CM | POA: Insufficient documentation

## 2020-08-24 DIAGNOSIS — I10 Essential (primary) hypertension: Secondary | ICD-10-CM

## 2020-08-24 DIAGNOSIS — I2583 Coronary atherosclerosis due to lipid rich plaque: Secondary | ICD-10-CM

## 2020-08-24 DIAGNOSIS — Z79899 Other long term (current) drug therapy: Secondary | ICD-10-CM | POA: Diagnosis not present

## 2020-08-24 DIAGNOSIS — E785 Hyperlipidemia, unspecified: Secondary | ICD-10-CM

## 2020-08-24 DIAGNOSIS — Z952 Presence of prosthetic heart valve: Secondary | ICD-10-CM | POA: Diagnosis not present

## 2020-08-24 DIAGNOSIS — I2510 Atherosclerotic heart disease of native coronary artery without angina pectoris: Secondary | ICD-10-CM | POA: Diagnosis not present

## 2020-08-24 DIAGNOSIS — Z951 Presence of aortocoronary bypass graft: Secondary | ICD-10-CM

## 2020-08-24 DIAGNOSIS — Z5181 Encounter for therapeutic drug level monitoring: Secondary | ICD-10-CM | POA: Diagnosis not present

## 2020-08-24 NOTE — Patient Instructions (Signed)
  Lab Work:  Your physician recommends that you HAVE LAB WORK TODAY  If you have labs (blood work) drawn today and your tests are completely normal, you will receive your results only by: Marland Kitchen MyChart Message (if you have MyChart) OR . A paper copy in the mail If you have any lab test that is abnormal or we need to change your treatment, we will call you to review the results.   Testing/Procedures:  A chest x-ray takes a picture of the organs and structures inside the chest, including the heart, lungs, and blood vessels. This test can show several things, including, whether the heart is enlarges; whether fluid is building up in the lungs; and whether pacemaker / defibrillator leads are still in place. [facility]-1ST FLOOR IMAGING   Follow-Up: At [facility], you and your health needs are our priority.  As part of our continuing mission to provide you with exceptional heart care, we have created designated Provider Care Teams.  These Care Teams include your primary Cardiologist (physician) and Advanced Practice Providers (APPs -  Physician Assistants and Nurse Practitioners) who all work together to provide you with the care you need, when you need it.  We recommend signing up for the patient portal called "MyChart".  Sign up information is provided on this After Visit Summary.  MyChart is used to connect with patients for Virtual Visits (Telemedicine).  Patients are able to view lab/test results, encounter notes, upcoming appointments, etc.  Non-urgent messages can be sent to your provider as well.   To learn more about what you can do with MyChart, go to [email or web address].    Your next appointment:   6 month(s)  The format for your next appointment:   In Person  Provider:   Kirk Ruths, MD

## 2020-08-25 LAB — CBC
Hematocrit: 37.6 % (ref 37.5–51.0)
Hemoglobin: 12.2 g/dL — ABNORMAL LOW (ref 13.0–17.7)
MCH: 32.3 pg (ref 26.6–33.0)
MCHC: 32.4 g/dL (ref 31.5–35.7)
MCV: 100 fL — ABNORMAL HIGH (ref 79–97)
Platelets: 199 10*3/uL (ref 150–450)
RBC: 3.78 x10E6/uL — ABNORMAL LOW (ref 4.14–5.80)
RDW: 11.8 % (ref 11.6–15.4)
WBC: 10.4 10*3/uL (ref 3.4–10.8)

## 2020-08-25 LAB — COMPREHENSIVE METABOLIC PANEL
ALT: 41 IU/L (ref 0–44)
AST: 25 IU/L (ref 0–40)
Albumin/Globulin Ratio: 1.5 (ref 1.2–2.2)
Albumin: 3.8 g/dL (ref 3.6–4.6)
Alkaline Phosphatase: 75 IU/L (ref 44–121)
BUN/Creatinine Ratio: 15 (ref 10–24)
BUN: 23 mg/dL (ref 8–27)
Bilirubin Total: 0.4 mg/dL (ref 0.0–1.2)
CO2: 22 mmol/L (ref 20–29)
Calcium: 9.2 mg/dL (ref 8.6–10.2)
Chloride: 100 mmol/L (ref 96–106)
Creatinine, Ser: 1.58 mg/dL — ABNORMAL HIGH (ref 0.76–1.27)
GFR calc Af Amer: 45 mL/min/{1.73_m2} — ABNORMAL LOW (ref >59)
GFR calc non Af Amer: 39 mL/min/{1.73_m2} — ABNORMAL LOW (ref >59)
Globulin, Total: 2.6 g/dL (ref 1.5–4.5)
Glucose: 204 mg/dL — ABNORMAL HIGH (ref 65–99)
Potassium: 5 mmol/L (ref 3.5–5.2)
Sodium: 139 mmol/L (ref 134–144)
Total Protein: 6.4 g/dL (ref 6.0–8.5)

## 2020-08-25 LAB — TSH: TSH: 3.01 u[IU]/mL (ref 0.450–4.500)

## 2020-09-07 ENCOUNTER — Telehealth: Payer: Medicare Other

## 2020-09-07 MED FILL — glyBURIDE 5 MG TABS: 5 | 90 days supply | Qty: 90 | Fill #2

## 2020-09-13 ENCOUNTER — Ambulatory Visit: Payer: Medicare Other | Admitting: Pharmacist

## 2020-09-13 DIAGNOSIS — E119 Type 2 diabetes mellitus without complications: Secondary | ICD-10-CM

## 2020-09-13 DIAGNOSIS — I10 Essential (primary) hypertension: Secondary | ICD-10-CM

## 2020-09-13 NOTE — Chronic Care Management (AMB) (Signed)
Chronic Care Management Pharmacy  Name: Peter Scott  MRN: [id number] DOB: 06/18/1932   Chief Complaint/ HPI  Peter Scott,  86 y.o. , male presents for their Follow-Up CCM visit with the clinical pharmacist via telephone due to COVID-19 Pandemic.  PCP : [name], MD  Their chronic conditions include: Hypertension, Hyperlipidemia, Diabetes, Atrial Fibrillation and Coronary Artery Disease   Office Visits: 08/12/20: HD flu vaccine given.   Consult Visit: 08/24/20: [place] visit w/ Dr. [name] - No med changes noted  Allergies  Allergen Reactions  . Glipizide Palpitations    "Heart racing" "Heart racing"    Medications: Outpatient Encounter Medications as of 09/13/2020  Medication Sig Note  . acetaminophen (TYLENOL) 500 MG tablet Take 1 tablet (500 mg total) by mouth every 6 (six) hours as needed. (Patient taking differently: Take 500 mg by mouth every 6 (six) hours as needed for moderate pain or headache. )   . amiodarone (PACERONE) 200 MG tablet Take 1 tablet (200 mg total) by mouth daily.   Marland Kitchen amLODipine (NORVASC) 5 MG tablet Take 1 tablet (5 mg total) by mouth daily.   Marland Kitchen amoxicillin (AMOXIL) 500 MG capsule TAKE 4 CAPSULES (2,000 MG TOTAL) BY MOUTH AS DIRECTED. TAKE 1 HOUR PRIOR TO DENTAL VISITS.   Marland Kitchen apixaban (ELIQUIS) 2.5 MG TABS tablet Take 1 tablet (2.5 mg total) by mouth 2 (two) times daily.   Marland Kitchen atorvastatin (LIPITOR) 80 MG tablet Take 0.5 tablets (40 mg total) by mouth at bedtime. 05/12/2020: Has the 33m tablet  . Blood Glucose Monitoring Suppl (CONTOUR NEXT EZ) w/Device KIT 1 kit by Does not apply route daily. Use once a Sajad Glander to check blood sugar.  Dx code: E11.9   . glucose blood (CONTOUR NEXT TEST) test strip Use once a Bryson Gavia to check blood sugar.  Dx code: E11.9   . glyBURIDE (DIABETA) 5 MG tablet TAKE 1/2 TABLET (2.5 MG TOTAL) BY MOUTH 2 (TWO) TIMES DAILY WITH A MEAL. (Patient taking differently: Take 1/2 tab before breakfast) 05/12/2020: Taking 1/2 tab once  daily with evening meal  . losartan (COZAAR) 100 MG tablet Take 0.5 tablets (50 mg total) by mouth 2 (two) times daily.   . metFORMIN (GLUCOPHAGE) 500 MG tablet TAKE 2 TABLETS BY MOUTH EVERY MORNING THEN TAKE 1 TABLET BY MOUTH EVERY EVENING   . spironolactone (ALDACTONE) 25 MG tablet Take 0.5 tablets (12.5 mg total) by mouth daily. (Patient taking differently: Take 12.5 mg by mouth at bedtime. )    No facility-administered encounter medications on file as of 09/13/2020.   SDOH Screenings   Alcohol Screen:   . Last Alcohol Screening Score (AUDIT): Not on file  Depression (PHQ2-9): Low Risk   . PHQ-2 Score: 0  Financial Resource Strain: Low Risk   . Difficulty of Paying Living Expenses: Not very hard  Food Insecurity: No Food Insecurity  . Worried About RCharity fundraiserin the Last Year: Never true  . Ran Out of Food in the Last Year: Never true  Housing: Low Risk   . Last Housing Risk Score: 0  Physical Activity:   . Days of Exercise per Week: Not on file  . Minutes of Exercise per Session: Not on file  Social Connections:   . Frequency of Communication with Friends and Family: Not on file  . Frequency of Social Gatherings with Friends and Family: Not on file  . Attends Religious Services: Not on file  . Active Member of Clubs or Organizations:  Not on file  . Attends Club or Organization Meetings: Not on file  . Marital Status: Not on file  Stress:   . Feeling of Stress : Not on file  Tobacco Use: Medium Risk  . Smoking Tobacco Use: Former Smoker  . Smokeless Tobacco Use: Never Used  Transportation Needs: No Transportation Needs  . Lack of Transportation (Medical): No  . Lack of Transportation (Non-Medical): No     Current Diagnosis/Assessment:    Goals Addressed            This Visit's Progress   . Chronic Care Management Pharmacy Care Plan       CARE PLAN ENTRY (see longitudinal plan of care for additional care plan information)  Current Barriers:  . Chronic  Disease Management support, education, and care coordination needs related to Hypertension, Hyperlipidemia, Diabetes, Atrial Fibrillation and Coronary Artery Disease    Hypertension BP Readings from Last 3 Encounters:  08/24/20 (!) 162/60  01/27/20 (!) 156/62  10/01/19 (!) 152/82   . Pharmacist Clinical Goal(s): o Over the next 90 days, patient will work with PharmD and providers to achieve BP goal <140/90 . Current regimen:  . Amlodipine 5 mg daily AM before breakfast . Losartan 100 mg 1/2 tab BID  . Spironolactone 25 mg 1/2 tab daily AM . Interventions: o Requested patient to record BP . Patient self care activities - Over the next 90 days, patient will: o Check BP 1-2 times per week, document, and provide at future appointments o Ensure daily salt intake < 2300 mg/day  Hyperlipidemia Lab Results  Component Value Date/Time   LDLCALC 72 07/29/2019 08:27 AM   . Pharmacist Clinical Goal(s): o Over the next 90 days, patient will work with PharmD and providers to achieve LDL goal < 70 . Current regimen:  o Atorvastatin 40 mg daily . Interventions: o Discussed importance of diet and reducing consumption of cholesterol . Patient self care activities - Over the next 90 days, patient will: o Reduce consumption of cholesterol  Diabetes Lab Results  Component Value Date/Time   HGBA1C 8.4 (H) 10/01/2019 09:48 AM   HGBA1C 8.2 (H) 05/29/2019 08:16 AM   . Pharmacist Clinical Goal(s): o Over the next 90 days, patient will work with PharmD and providers to achieve A1c goal <8% . Current regimen:  . Glyburide 5 mg 1/2 tab BID  . Metformin 500 mg 2 tab AM, 1 tab PM . Interventions: o Requested patient to check blood sugar 2 hours after meal a few times per week to assess if post prandial blood sugar is above goal o Discussed DM goals o Continue glyburide 1/2 tab with breakfast and dinner . Patient self care activities - Over the next 90 days, patient will: o Check blood sugar once  daily, document, and provide at future appointments o Contact provider with any episodes of hypoglycemia  Health Maintenance  . Pharmacist Clinical Goal(s) o Over the next 90 days, patient will work with PharmD and providers to complete health maintenance screenings/vaccinations . Interventions: o Consider completing Shingrix vaccine series (completed both vaccines) . Patient self care activities - Over the next 90 days, patient will: o Complete Shingrix vaccine series (completed on 08/26/20)  Medication management . Pharmacist Clinical Goal(s): o Over the next 90 days, patient will work with PharmD and providers to maintain optimal medication adherence . Current pharmacy: [facility] . Interventions o Comprehensive medication review performed. o Continue current medication management strategy . Patient self care activities - Over   the next 90 days, patient will: o Focus on medication adherence by filling and taking medications appropriately  o Take medications as prescribed o Report any questions or concerns to PharmD and/or provider(s)  Please see past updates related to this goal by clicking on the "Past Updates" button in the selected goal       Social Hx: Moved here from [place] to be closer to their kids.  Moved here 6 years ago. Only community outside of children is church, but this has been put on hold due to COVID. Married 65 years.  Has 2 sons and 2 granddaughters.   [facility]   Hypertension   CMP Latest Ref Rng & Units 08/24/2020 01/27/2020 10/01/2019  Glucose 65 - 99 mg/dL 204(H) 244(H) 282(H)  BUN 8 - 27 mg/dL 23 23 30(H)  Creatinine 0.76 - 1.27 mg/dL 1.58(H) 1.63(H) 1.44  Sodium 134 - 144 mmol/L 139 135 136  Potassium 3.5 - 5.2 mmol/L 5.0 5.0 4.4  Chloride 96 - 106 mmol/L 100 98 101  CO2 20 - 29 mmol/L 22 23 25  Calcium 8.6 - 10.2 mg/dL 9.2 9.3 9.0  Total Protein 6.0 - 8.5 g/dL 6.4 6.5 -  Total Bilirubin 0.0 - 1.2 mg/dL  0.4 0.4 -  Alkaline Phos 44 - 121 IU/L 75 73 -  AST 0 - 40 IU/L 25 28 -  ALT 0 - 44 IU/L 41 39 -   Kidney Function Lab Results  Component Value Date/Time   CREATININE 1.58 (H) 08/24/2020 08:58 AM   CREATININE 1.63 (H) 01/27/2020 08:22 AM   CREATININE 1.02 07/08/2014 11:18 AM   GFR 46.56 (L) 10/01/2019 09:48 AM   GFRNONAA 39 (L) 08/24/2020 08:58 AM   GFRNONAA 69 07/08/2014 11:18 AM   GFRAA 45 (L) 08/24/2020 08:58 AM   GFRAA 80 07/08/2014 11:18 AM   K 5.0 08/24/2020 08:58 AM   K 5.0 01/27/2020 08:22 AM   BP goal is:  <140/90  Office blood pressures are  BP Readings from Last 3 Encounters:  08/24/20 (!) 162/60  01/27/20 (!) 156/62  10/01/19 (!) 152/82   Patient checks BP at home daily Patient home BP readings are ranging: 130s-140s/60s heart rate usually runs per patient report Feels his BP are lower in the evening vs the morning when his BP is checked during office visits  Patient has failed these meds in the past: None noted  Patient is currently borderline controlled per home BP on the following medications:  . Amlodipine 5 mg daily AM before breakfast . Losartan 100 mg 1/2 tab BID  . Spironolactone 25 mg 1/2 tab daily AM  Patient checked BP while on phone. BP was 138/56 with pulse of 59. Patient checked BG while on 176 at 10:55am (breakfast was at 7am) We discussed Blood pressure goal   Update 06/08/20 States [facility]  Patient has kept an excellent record of his home BP as noted below.   BP Average 140.6 61.2 58.0  Update 07/06/20 131 55 143 60 141 60 148 60 137 60 126 52 116 52 141 58 149 60 132 57 135 58 148 54 148 63 140 57 Avg: 138.2 57.5 He feels his BP is higher when he first wakes up in the morning.   Update 09/13/20 144 54 60 b = after breakfast 133 56 52 l = after lunch 140 56 59 b 136 59 57 l 151 63 60 D = after dinner 149 60   65 d 145 88 64 D Avg 142.5 57.7 59.5  Initially had a cough, but this resolved after his  covid shot. Recurred about a month ago. Relieved with cough drop. Dry cough. Denies headache and fever. Reports fatigue.  Could consider covid testing. Weight today is 164lbs, feels he is usually about 168lbs.  Low suspicion of losartan as cause, but this could probable.    Plan -Continue current medications  -Consider getting tested for covid.  -Could consider trial of D/C losartan to see if cough resolves, but pt would most likely need med titration of other agents for BP control   Diabetes   A1c goal <8% (considering age and comorbidities) FBG goal 90-150 PPBG goal <180  Recent Relevant Labs: Lab Results  Component Value Date/Time   HGBA1C 8.4 (H) 10/01/2019 09:48 AM   HGBA1C 8.2 (H) 05/29/2019 08:16 AM   GFR 46.56 (L) 10/01/2019 09:48 AM   GFR 44.79 (L) 05/29/2019 08:16 AM    Last diabetic Eye exam:  Lab Results  Component Value Date/Time   HMDIABEYEEXA No Retinopathy 05/20/2015 12:00 AM    Last diabetic Foot exam: No results found for: HMDIABFOOTEX   Checking BG: Daily  Recent FBG Readings: States he has a new meter  103  85  87  74  85  83  121 had a big dinner night before 69  68  113  87  76  126 had a big dinner night before 85  85  80 Average  89.1875  Patient has failed these meds in past: Januvia (cost), glipizide (heart racing) Patient is currently uncontrolled based on a1c, but controlled per FBG on the following medications: . Glyburide 5 mg 1/2 tab BID . Metformin 500 mg 2 tab AM, 1 tab PM  States he moved glyburide dose to PM vs AM due to having hypoglycemia in the middle of the night when he took the the dose in the AM.  Feels hypoglycemia symptoms when his BG is less than 60. Treats hypoglycemia with glass of milk  Diet B - cereal and milk mostly sometimes egg and bacon L - fish, chicken, salads, sweet potato fries (largest meal of the Daxten Kovalenko) D - rotisserie chicken wing, watermelon, cottage cheese Snacks - peanut butter crackers,  celery sticks with peanut butter Drinks - water mainly, unsweet tea, diet coke, reduced sugar orange juice  Exercise Went to sports center pre-COVID [place] farm with his sons  Suspect pt's BG is above goal after meals or glucometer is not properly calibrated although he reports his meter is new and he is confident that it is accurate.   We discussed: diet and exercise extensively, DM goals, how his fasting BG are at goal, but there is a high probability that his post prandial blood sugars are above goal noting his a1c and glucose levels from most recent cmp.   Update 06/08/20 He has kept an excellent record of his post prandial BG and BPs Reports his a1c was 8.5% (05/27/20) when he got checked at [facility].  As suspected, his post prandial BG are above goal while his fastings are controlled to too low.  This is most likely as a result of taking glyburide with evening meal (smallest meal) vs his largest meal (breakfast). Will plan to move glyburide to largest meal of the Reem Fleury with consideration of adding additional half tab of glyburide to lunch as well.  Fasting 2 hrs after breakfast 2 hrs after lunch 2 hrs after dinner  Avg  67.7 216 248.2 181.2    Update 07/06/20 Is taking glyburide as he is eating his food. Started second glyburide tab on 06/10/20 States he watching the volume of what he eats as well as what he eats. AB AL Ad 190 124 165 143 152 117 207 161 129 158 123 181 199 209 142 174 147 172  231   164  Avg 178.5 163.8  151 Congratulated patient on improved BG averages. He is hopeful his a1c will be improved at [facility] next [facility] or w/ Dr. [name]  Update 09/13/20 67 140 176 170 222 162 190 129 230 Avg [phone]  Fasting 2 hrs after breakfast 2 hrs after lunch 2 hrs after dinner  Avg      103.5 199 166 183    BG elevated from last visit, but still improved compared to initial visit. States he went to a kidney doctor and was told he needed to have an  ultrasound of his kidney. This is scheduled for next Friday (09/23/20?) Updated [facility]1c at [facility]? No (Need to schedule visit w/ Dr. [name]) Reports a low this morning and another low a couple of days ago. Treats his lows with orange juice  Plan -Continue current medications  -Update a1c and cmp at next visit. Will coordinate to have patient seen by PCP  Future Plan -Update kidney function labs. If GFR <45 then decrease metformin to 549m twice daily -Consider starting farxiga 134mdaily to assist with hyperglycemia and CKD (will have to complete PAP as cost has been barrier in past with branded medications)   Vaccines   Reviewed and discussed patient's vaccination history.    Immunization History  Administered Date(s) Administered  . Fluad Quad(high Dose 65+) 08/12/2019, 08/12/2020  . Influenza, High Dose Seasonal PF 08/25/2015, 09/02/2017, 08/25/2018  . Influenza,inj,Quad PF,6+ Mos 09/01/2014, 08/27/2016  . Moderna SARS-COVID-2 Vaccination 12/23/2019, 01/20/2020  . Pneumococcal Conjugate-13 10/04/2014  . Pneumococcal Polysaccharide-23 10/10/2015  . Tdap 01/05/2015  . Zoster Recombinat (Shingrix) 05/27/2020   [facility]orts [facility] for second Shingrix shot  Update 09/13/20 Received 2nd Shingrix vaccine? Yes on 08/26/20  Plan -Update Shingles vaccine record  KaMelvenia Beamay, PharmD Clinical Pharmacist [phone]

## 2020-09-13 NOTE — Patient Instructions (Signed)
Visit Information  Goals Addressed            This Visit's Progress   . Chronic Care Management Pharmacy Care Plan       CARE PLAN ENTRY (see longitudinal plan of care for additional care plan information)  Current Barriers:  . Chronic Disease Management support, education, and care coordination needs related to Hypertension, Hyperlipidemia, Diabetes, Atrial Fibrillation and Coronary Artery Disease    Hypertension BP Readings from Last 3 Encounters:  08/24/20 (!) 162/60  01/27/20 (!) 156/62  10/01/19 (!) 152/82   . Pharmacist Clinical Goal(s): o Over the next 90 days, patient will work with PharmD and providers to achieve BP goal <140/90 . Current regimen:  . Amlodipine 5 mg daily AM before breakfast . Losartan 100 mg 1/2 tab BID  . Spironolactone 25 mg 1/2 tab daily AM . Interventions: o Requested patient to record BP . Patient self care activities - Over the next 90 days, patient will: o Check BP 1-2 times per week, document, and provide at future appointments o Ensure daily salt intake < 2300 mg/Cane Dubray  Hyperlipidemia Lab Results  Component Value Date/Time   LDLCALC 72 07/29/2019 08:27 AM   . Pharmacist Clinical Goal(s): o Over the next 90 days, patient will work with PharmD and providers to achieve LDL goal < 70 . Current regimen:  o Atorvastatin 40 mg daily . Interventions: o Discussed importance of diet and reducing consumption of cholesterol . Patient self care activities - Over the next 90 days, patient will: o Reduce consumption of cholesterol  Diabetes Lab Results  Component Value Date/Time   HGBA1C 8.4 (H) 10/01/2019 09:48 AM   HGBA1C 8.2 (H) 05/29/2019 08:16 AM   . Pharmacist Clinical Goal(s): o Over the next 90 days, patient will work with PharmD and providers to achieve A1c goal <8% . Current regimen:  . Glyburide 5 mg 1/2 tab BID  . Metformin 500 mg 2 tab AM, 1 tab PM . Interventions: o Requested patient to check blood sugar 2 hours after meal a  few times per week to assess if post prandial blood sugar is above goal o Discussed DM goals o Continue glyburide 1/2 tab with breakfast and dinner . Patient self care activities - Over the next 90 days, patient will: o Check blood sugar once daily, document, and provide at future appointments o Contact provider with any episodes of hypoglycemia  Health Maintenance  . Pharmacist Clinical Goal(s) o Over the next 90 days, patient will work with PharmD and providers to complete health maintenance screenings/vaccinations . Interventions: o Consider completing Shingrix vaccine series (completed both vaccines) . Patient self care activities - Over the next 90 days, patient will: o Complete Shingrix vaccine series (completed on 08/26/20)  Medication management . Pharmacist Clinical Goal(s): o Over the next 90 days, patient will work with PharmD and providers to maintain optimal medication adherence . Current pharmacy: [facility] . Interventions o Comprehensive medication review performed. o Continue current medication management strategy . Patient self care activities - Over the next 90 days, patient will: o Focus on medication adherence by filling and taking medications appropriately  o Take medications as prescribed o Report any questions or concerns to PharmD and/or provider(s)  Please see past updates related to this goal by clicking on the "Past Updates" button in the selected goal         The patient verbalized understanding of instructions provided today and agreed to receive a mailed copy of patient  instruction and/or Scientist, clinical (histocompatibility and immunogenetics).  Telephone follow up appointment with pharmacy team member scheduled for: 10/27/2020  Melvenia Beam Jennafer Gladue, PharmD Clinical Pharmacist [facility] at [facility] [phone]

## 2020-09-24 NOTE — Progress Notes (Addendum)
[facility] [address] [place], [place] [address] [phone]  Date:  09/26/2020   Name:  Peter Scott   DOB:  03-26-1932   MRN:  [id number]  PCP:  [name], MD    Chief Complaint: Foot Exam   History of Present Illness:  Peter Scott is a 86 y.o. very pleasant male patient who presents with the following:  Pt here today for a follow-up visit- History of heart disease status post CABG 2001, aortic stenosis status post TAVR 2019, diabetes, hyperlipidemia, atrial fibrillation, AAA Last seen by myself about one year ago  Seen by cardiology a month ago:  1 paroxysmal atrial fibrillation-patient remains in sinus rhythm.  Continue amiodarone and apixaban.  Check TSH, liver functions and chest x-ray.  Check hemoglobin and renal function. 2 hypertension-blood pressure elevated; however typically controlled at home.  Continue present medications and follow. 3 hyperlipidemia-continue statin. 4 previous TAVR-continue SBE prophylaxis. 5 coronary artery disease status post coronary artery bypass graft-patient is not on aspirin given need for apixaban.  Continue statin. 6 question history of abdominal aortic aneurysm-not evident on most recent ultrasound.  A1c is due Foot exam due Eye exam- done in December  Flu vaccine done  covid series - Booster will be done today  shingrix done   CMP, CBC on chart from last month  ?PSA-pt declines He does get some of his care at the [place]- they are following him from a nephrology?standpoint we think  He will get some LE edema at times- wearing compression socks helps him His home BP readings show SBP in the 150s, but DBP in the 50- 60s  He is checking his blood sugar and blood pressure regularly since participating in Pharm.D. 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 Paroxysmal atrial fibrillation (HCC)   . 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 S/P CABG x 4   . Severe aortic stenosis   . Medicare annual wellness visit, subsequent 10/04/2014  . Bruit 09/29/2014  . Abdominal aortic aneurysm ([place]) 07/23/2014  . BCC (basal cell carcinoma) 07/23/2014  . SCC (squamous cell carcinoma), arm 07/23/2014  . Essential hypertension, benign 07/08/2014  . Coronary artery disease 07/08/2014  . Hyperlipidemia LDL goal <100 07/08/2014  . Type II diabetes mellitus, well controlled ([place]) 07/08/2014  . Colon cancer screening 07/08/2014    Past Medical History:  Diagnosis Date  . Aortic aneurysm ([place])   . Aortic stenosis   . CAD (coronary artery disease)   . Diabetes mellitus type II, controlled ([place])   . Heart disease    Ablation  . History of chicken pox   . Hyperlipidemia   . Hypertension   . S/P CABG x 4    2001 - [place]  . 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 Umbilical  hernia     Past Surgical History:  Procedure Laterality Date  . ABLATION     Rhythm problem; rhythm unknown; [place]  . CARDIOVERSION N/A 07/14/2018   Procedure: CARDIOVERSION;  Surgeon: Dorothy Spark, MD;  Location: [facility] ENDOSCOPY;  Service: Cardiovascular;  Laterality: N/A;  . CARDIOVERSION N/A 08/27/2018   Procedure: CARDIOVERSION;  Surgeon: Jerline Pain, MD;  Location: [facility] ENDOSCOPY;  Service: Cardiovascular;  Laterality: N/A;  . CORONARY ARTERY BYPASS GRAFT  2001  . EXPLORATION POST OPERATIVE OPEN HEART   2001, June  . INTRAOPERATIVE TRANSTHORACIC ECHOCARDIOGRAM N/A 04/01/2018   Procedure: INTRAOPERATIVE TRANSTHORACIC ECHOCARDIOGRAM;  Surgeon: Burnell Blanks, MD;  Location: [facility];  Service: Open Heart Surgery;  Laterality: N/A;  . RIGHT/LEFT HEART CATH AND CORONARY/GRAFT ANGIOGRAPHY N/A 01/31/2018   Procedure: RIGHT/LEFT HEART CATH AND CORONARY/GRAFT ANGIOGRAPHY;  Surgeon: Burnell Blanks, MD;  Location: [place] CV LAB;  Service: Cardiovascular;  Laterality: N/A;  . TEE WITHOUT CARDIOVERSION N/A 01/17/2018   Procedure: TRANSESOPHAGEAL ECHOCARDIOGRAM (TEE);  Surgeon: Lelon Perla, MD;  Location: [facility] ENDOSCOPY;  Service: Cardiovascular;  Laterality: N/A;  . TEE WITHOUT CARDIOVERSION N/A 07/14/2018   Procedure: TRANSESOPHAGEAL ECHOCARDIOGRAM (TEE);  Surgeon: Dorothy Spark, MD;  Location: [facility] ENDOSCOPY;  Service: Cardiovascular;  Laterality: N/A;  . TONSILLECTOMY    . TRANSCATHETER AORTIC VALVE REPLACEMENT, TRANSFEMORAL N/A 04/01/2018   Procedure: TRANSCATHETER AORTIC VALVE REPLACEMENT, TRANSFEMORAL;  Surgeon: Burnell Blanks, MD;  Location: [place];  Service: Open Heart Surgery;  Laterality: N/A;  . UMBILICAL HERNIA REPAIR  2015    Social History   Tobacco Use  . Smoking status: Former Smoker    Packs/day: 1.00    Years: 3.00    Pack years: 3.00  . Smokeless tobacco: Never Used  . Tobacco comment: quit tobbaco in his 54s  Vaping Use  . Vaping Use: Never used  Substance Use Topics  . Alcohol use: No    Alcohol/week: 0.0 standard drinks  . Drug use: No    Family History  Problem Relation Age of Onset  . Diabetes Mother 78       Deceased  . Heart disease Father 29       Deceased  . Heart disease Maternal Uncle   . Diabetes Maternal Uncle   . Heart disease Brother   . Diabetes Brother   . Diabetes Sister        #1  . Heart disease Sister        #2  . Hyperlipidemia Son        x2    Allergies  Allergen Reactions  . Glipizide Palpitations     "Heart racing" "Heart racing"    Medication list has been reviewed and updated.  Current Outpatient Medications on File Prior to Visit  Medication Sig Dispense Refill  . acetaminophen (TYLENOL) 500 MG tablet Take 1 tablet (500 mg total) by mouth every 6 (six) hours as needed. (Patient taking differently: Take 500 mg by mouth every 6 (six) hours as needed for moderate pain or headache. ) 30 tablet 0  . amiodarone (PACERONE) 200 MG tablet Take 1 tablet (200 mg total) by mouth daily. 90 tablet 3  . amLODipine (NORVASC) 5 MG tablet Take 1 tablet (5 mg total) by mouth daily.    Marland Kitchen amoxicillin (AMOXIL) 500 MG capsule TAKE 4 CAPSULES (2,000 MG TOTAL) BY MOUTH AS DIRECTED. TAKE 1 HOUR PRIOR TO DENTAL VISITS. 8 capsule 6  . apixaban (ELIQUIS) 2.5 MG TABS tablet Take  1 tablet (2.5 mg total) by mouth 2 (two) times daily.    Marland Kitchen atorvastatin (LIPITOR) 80 MG tablet Take 0.5 tablets (40 mg total) by mouth at bedtime. 90 tablet 1  . Blood Glucose Monitoring Suppl (CONTOUR NEXT EZ) w/Device KIT 1 kit by Does not apply route daily. Use once a day to check blood sugar.  Dx code: E11.9 1 kit 0  . glucose blood (CONTOUR NEXT TEST) test strip Use once a day to check blood sugar.  Dx code: E11.9 100 each 12  . glyBURIDE (DIABETA) 5 MG tablet TAKE 1/2 TABLET (2.5 MG TOTAL) BY MOUTH 2 (TWO) TIMES DAILY WITH A MEAL. (Patient taking differently: Take 1/2 tab before breakfast) 90 tablet 3  . losartan (COZAAR) 100 MG tablet Take 0.5 tablets (50 mg total) by mouth 2 (two) times daily. 90 tablet 1  . metFORMIN (GLUCOPHAGE) 500 MG tablet TAKE 2 TABLETS BY MOUTH EVERY MORNING THEN TAKE 1 TABLET BY MOUTH EVERY EVENING 270 tablet 1  . spironolactone (ALDACTONE) 25 MG tablet Take 0.5 tablets (12.5 mg total) by mouth daily. (Patient taking differently: Take 12.5 mg by mouth at bedtime. ) 45 tablet 1   No current facility-administered medications on file prior to visit.    Review of Systems:  As per HPI- otherwise  negative.   Physical Examination: Vitals:   09/26/20 1103  BP: (!) 142/80  Pulse: (!) 54  Resp: 17  SpO2: 94%   Vitals:   09/26/20 1103  Weight: 171 lb (77.6 kg)  Height: 5' 9" (1.753 m)   Body mass index is 25.25 kg/m. Ideal Body Weight: Weight in (lb) to have BMI = 25: 168.9  GEN: no acute distress.  Older male who appears very young for age, looks well HEENT: Atraumatic, Normocephalic.  Ears and Nose: No external deformity. CV: RRR, No M/G/R. No JVD. No thrill. No extra heart sounds. PULM: CTA B, no wheezes, crackles, rhonchi. No retractions. No resp. distress. No accessory muscle use.Marland Kitchen EXTR: No c/c/e PSYCH: Normally interactive. Conversant.  Foot exam: Normal  Assessment and Plan: Essential hypertension, benign  Controlled type 2 diabetes mellitus without complication, without long-term current use of insulin ([place]) - Plan: Hemoglobin A1c  Hyperlipidemia LDL goal <100  Coronary artery disease due to lipid rich plaque  Patient seen today for follow-up visit Check A1c today Consider stopping/ dropping his metformin with labs- renal function is borderline   Systolic blood pressure is borderline high.  However, his diastolic is in the fifties and sixties.  Given his age, prefer to allow systolic pressure to be slightly high and avoid risk of hypotension and falls Leave blood pressure medications as they are for now This visit occurred during the SARS-CoV-2 public health emergency.  Safety protocols were in place, including screening questions prior to the visit, additional usage of staff PPE, and extensive cleaning of exam room while observing appropriate contact time as indicated for disinfecting solutions.    Signed Lamar Blinks, MD  Received patient's A1c as below, message to patient Calculated GFR at 39-okay to continue Metformin, but can consider decreasing dose  Results for orders placed or performed in visit on 09/26/20  Hemoglobin A1c  Result Value Ref  Range   Hgb A1c MFr Bld 7.7 (H) 4.6 - 6.5 %    Less than 30 contraindicated

## 2020-09-24 NOTE — Patient Instructions (Addendum)
Good to see you again today I will be in touch with your A1c asap  You look terrific!  I think for the time being let's leave your BP meds where they are   Try some OTC loratadine (claritin) for allergies

## 2020-09-26 ENCOUNTER — Encounter: Payer: Self-pay | Admitting: Family Medicine

## 2020-09-26 ENCOUNTER — Other Ambulatory Visit: Payer: Self-pay

## 2020-09-26 ENCOUNTER — Ambulatory Visit: Payer: Medicare Other | Attending: Internal Medicine

## 2020-09-26 ENCOUNTER — Ambulatory Visit (INDEPENDENT_AMBULATORY_CARE_PROVIDER_SITE_OTHER): Payer: Medicare Other | Admitting: Family Medicine

## 2020-09-26 ENCOUNTER — Other Ambulatory Visit (HOSPITAL_BASED_OUTPATIENT_CLINIC_OR_DEPARTMENT_OTHER): Payer: Self-pay | Admitting: Internal Medicine

## 2020-09-26 VITALS — BP 142/80 | HR 54 | Resp 17 | Ht 69.0 in | Wt 171.0 lb

## 2020-09-26 DIAGNOSIS — I2510 Atherosclerotic heart disease of native coronary artery without angina pectoris: Secondary | ICD-10-CM

## 2020-09-26 DIAGNOSIS — E119 Type 2 diabetes mellitus without complications: Secondary | ICD-10-CM

## 2020-09-26 DIAGNOSIS — I10 Essential (primary) hypertension: Secondary | ICD-10-CM | POA: Diagnosis not present

## 2020-09-26 DIAGNOSIS — I2583 Coronary atherosclerosis due to lipid rich plaque: Secondary | ICD-10-CM

## 2020-09-26 DIAGNOSIS — E785 Hyperlipidemia, unspecified: Secondary | ICD-10-CM | POA: Diagnosis not present

## 2020-09-26 DIAGNOSIS — Z23 Encounter for immunization: Secondary | ICD-10-CM

## 2020-09-26 LAB — HEMOGLOBIN A1C: Hgb A1c MFr Bld: 7.7 % — ABNORMAL HIGH (ref 4.6–6.5)

## 2020-09-26 NOTE — Progress Notes (Signed)
   Covid-19 Vaccination Clinic  Name:  Peter Scott    MRN: [id number] DOB: 1932-03-25  09/26/2020  [name] was observed post Covid-19 immunization for 15 minutes without incident. He was provided with Vaccine Information Sheet and instruction to access the V-Safe system.   [name] was instructed to call 911 with any severe reactions post vaccine: Marland Kitchen Difficulty breathing  . Swelling of face and throat  . A fast heartbeat  . A bad rash all over body  . Dizziness and weakness

## 2020-09-30 MED FILL — MODERNA COVID-19 VACCINE 10: 100 | 1 days supply | Qty: 0 | Fill #0

## 2020-10-26 ENCOUNTER — Other Ambulatory Visit: Payer: Self-pay | Admitting: Family Medicine

## 2020-10-26 MED FILL — METFORMIN HCL 500 MG TABS: 500 | 90 days supply | Qty: 270 | Fill #0

## 2020-10-27 ENCOUNTER — Ambulatory Visit: Payer: Medicare Other | Admitting: Pharmacist

## 2020-10-27 DIAGNOSIS — I10 Essential (primary) hypertension: Secondary | ICD-10-CM

## 2020-10-27 DIAGNOSIS — E119 Type 2 diabetes mellitus without complications: Secondary | ICD-10-CM

## 2020-10-27 NOTE — Patient Instructions (Addendum)
Visit Information  Goals Addressed            This Visit's Progress   . Chronic Care Management Pharmacy Care Plan       CARE PLAN ENTRY (see longitudinal plan of care for additional care plan information)  Current Barriers:  . Chronic Disease Management support, education, and care coordination needs related to Hypertension, Hyperlipidemia, Diabetes, Atrial Fibrillation and Coronary Artery Disease    Hypertension BP Readings from Last 3 Encounters:  09/26/20 (!) 142/80  08/24/20 (!) 162/60  01/27/20 (!) 156/62   . Pharmacist Clinical Goal(s): o Over the next 90 days, patient will work with PharmD and providers to achieve BP goal <140/90 . Current regimen:  . Amlodipine 5 mg daily AM before breakfast . Losartan 100 mg 1/2 tab BID  . Spironolactone 25 mg 1/2 tab daily AM . Interventions: o Requested patient to record BP . Patient self care activities - Over the next 90 days, patient will: o Check BP 1-2 times per week, document, and provide at future appointments o Ensure daily salt intake < 2300 mg/Peter Scott  Hyperlipidemia Lab Results  Component Value Date/Time   LDLCALC 72 07/29/2019 08:27 AM   . Pharmacist Clinical Goal(s): o Over the next 90 days, patient will work with PharmD and providers to achieve LDL goal < 70 . Current regimen:  o Atorvastatin 40 mg daily . Interventions: o Discussed importance of diet and reducing consumption of cholesterol . Patient self care activities - Over the next 90 days, patient will: o Reduce consumption of cholesterol  Diabetes Lab Results  Component Value Date/Time   HGBA1C 7.7 (H) 09/26/2020 11:34 AM   HGBA1C 8.4 (H) 10/01/2019 09:48 AM   . Pharmacist Clinical Goal(s): o Over the next 90 days, patient will work with PharmD and providers to maintain A1c goal <8% . Current regimen:  . Glyburide 5 mg 1/2 tab BID  . Metformin 500 mg 1 tab AM, 1 tab PM . Interventions: o Requested patient to check blood sugar 2 hours after meal a  few times per week to assess if post prandial blood sugar is above goal o Discussed DM goals o Continue glyburide 1/2 tab with breakfast and dinner . Patient self care activities - Over the next 90 days, patient will: o Check blood sugar once daily, document, and provide at future appointments o Contact provider with any episodes of hypoglycemia  Health Maintenance  . Pharmacist Clinical Goal(s) o Over the next 90 days, patient will work with PharmD and providers to complete health maintenance screenings/vaccinations . Interventions: o Consider completing Shingrix vaccine series (completed both vaccines) . Patient self care activities - Over the next 90 days, patient will: o Complete Shingrix vaccine series (completed on 08/26/20)  Medication management . Pharmacist Clinical Goal(s): o Over the next 90 days, patient will work with PharmD and providers to maintain optimal medication adherence . Current pharmacy: [facility] . Interventions o Comprehensive medication review performed. o Continue current medication management strategy . Patient self care activities - Over the next 90 days, patient will: o Focus on medication adherence by filling and taking medications appropriately  o Take medications as prescribed o Report any questions or concerns to PharmD and/or provider(s)  Please see past updates related to this goal by clicking on the "Past Updates" button in the selected goal         The patient verbalized understanding of instructions, educational materials, and care plan provided today and agreed to receive  a mailed copy of patient instructions, educational materials, and care plan.   Telephone follow up appointment with pharmacy team member scheduled for: 04/27/2021  [name], [facility]    Carbohydrate Counting for Diabetes Mellitus, Adult  Carbohydrate counting is a method of keeping track of how many carbohydrates you eat. Eating carbohydrates naturally  increases the amount of sugar (glucose) in the blood. Counting how many carbohydrates you eat helps keep your blood glucose within normal limits, which helps you manage your diabetes (diabetes mellitus). It is important to know how many carbohydrates you can safely have in each meal. This is different for every person. A diet and nutrition specialist (registered dietitian) can help you make a meal plan and calculate how many carbohydrates you should have at each meal and snack. Carbohydrates are found in the following foods:  Grains, such as breads and cereals.  Dried beans and soy products.  Starchy vegetables, such as potatoes, peas, and corn.  Fruit and fruit juices.  Milk and yogurt.  Sweets and snack foods, such as cake, cookies, candy, chips, and soft drinks. How do I count carbohydrates? There are two ways to count carbohydrates in food. You can use either of the methods or a combination of both. Reading "Nutrition Facts" on packaged food The "Nutrition Facts" list is included on the labels of almost all packaged foods and beverages in the [place] It includes:  The serving size.  Information about nutrients in each serving, including the grams (g) of carbohydrate per serving. To use the "Nutrition Facts":  Decide how many servings you will have.  Multiply the number of servings by the number of carbohydrates per serving.  The resulting number is the total amount of carbohydrates that you will be having. Learning standard serving sizes of other foods When you eat carbohydrate foods that are not packaged or do not include "Nutrition Facts" on the label, you need to measure the servings in order to count the amount of carbohydrates:  Measure the foods that you will eat with a food scale or measuring cup, if needed.  Decide how many standard-size servings you will eat.  Multiply the number of servings by 15. Most carbohydrate-rich foods have about 15 g of carbohydrates per  serving. ? For example, if you eat 8 oz (170 g) of strawberries, you will have eaten 2 servings and 30 g of carbohydrates (2 servings x 15 g = 30 g).  For foods that have more than one food mixed, such as soups and casseroles, you must count the carbohydrates in each food that is included. The following list contains standard serving sizes of common carbohydrate-rich foods. Each of these servings has about 15 g of carbohydrates:   hamburger bun or  English muffin.   oz (15 mL) syrup.   oz (14 g) jelly.  1 slice of bread.  1 six-inch tortilla.  3 oz (85 g) cooked rice or pasta.  4 oz (113 g) cooked dried beans.  4 oz (113 g) starchy vegetable, such as peas, corn, or potatoes.  4 oz (113 g) hot cereal.  4 oz (113 g) mashed potatoes or  of a large baked potato.  4 oz (113 g) canned or frozen fruit.  4 oz (120 mL) fruit juice.  4-6 crackers.  6 chicken nuggets.  6 oz (170 g) unsweetened dry cereal.  6 oz (170 g) plain fat-free yogurt or yogurt sweetened with artificial sweeteners.  8 oz (240 mL) milk.  8 oz (170 g) fresh  fruit or one small piece of fruit.  24 oz (680 g) popped popcorn. Example of carbohydrate counting Sample meal  3 oz (85 g) chicken breast.  6 oz (170 g) brown rice.  4 oz (113 g) corn.  8 oz (240 mL) milk.  8 oz (170 g) strawberries with sugar-free whipped topping. Carbohydrate calculation 1. Identify the foods that contain carbohydrates: ? Rice. ? Corn. ? Milk. ? Strawberries. 2. Calculate how many servings you have of each food: ? 2 servings rice. ? 1 serving corn. ? 1 serving milk. ? 1 serving strawberries. 3. Multiply each number of servings by 15 g: ? 2 servings rice x 15 g = 30 g. ? 1 serving corn x 15 g = 15 g. ? 1 serving milk x 15 g = 15 g. ? 1 serving strawberries x 15 g = 15 g. 4. Add together all of the amounts to find the total grams of carbohydrates eaten: ? 30 g + 15 g + 15 g + 15 g = 75 g of carbohydrates  total. Summary  Carbohydrate counting is a method of keeping track of how many carbohydrates you eat.  Eating carbohydrates naturally increases the amount of sugar (glucose) in the blood.  Counting how many carbohydrates you eat helps keep your blood glucose within normal limits, which helps you manage your diabetes.  A diet and nutrition specialist (registered dietitian) can help you make a meal plan and calculate how many carbohydrates you should have at each meal and snack. This information is not intended to replace advice given to you by your health care provider. Make sure you discuss any questions you have with your health care provider. Document Revised: 05/30/2017 Document Reviewed: 04/18/2016 Elsevier Patient Education  [place].

## 2020-10-27 NOTE — Chronic Care Management (AMB) (Signed)
Chronic Care Management Pharmacy  Name: VIRAAJ VORNDRAN  MRN: [id number] DOB: Dec 16, 1931   Chief Complaint/ HPI  Peter Scott,  86 y.o. , male presents for their Follow-Up CCM visit with the clinical pharmacist via telephone due to COVID-19 Pandemic.  PCP : [name], MD  Their chronic conditions include: Hypertension, Hyperlipidemia, Diabetes, Atrial Fibrillation and Coronary Artery Disease   Office Visits: 09/26/20: Provider message to decrease metformin to 565m twice daily  09/26/20: Visit w/ Dr. CLorelei Pont- Consider decreasing or stopping metformin due to kidney function. Considering age no changes to BP Meds.   Consult Visit: None since last CCM visit on 09/13/20.   Allergies  Allergen Reactions  . Glipizide Palpitations    "Heart racing" "Heart racing"    Medications: Outpatient Encounter Medications as of 10/27/2020  Medication Sig Note  . acetaminophen (TYLENOL) 500 MG tablet Take 1 tablet (500 mg total) by mouth every 6 (six) hours as needed. (Patient taking differently: Take 500 mg by mouth every 6 (six) hours as needed for moderate pain or headache. )   . amiodarone (PACERONE) 200 MG tablet Take 1 tablet (200 mg total) by mouth daily.   .Marland KitchenamLODipine (NORVASC) 5 MG tablet Take 1 tablet (5 mg total) by mouth daily.   .Marland Kitchenamoxicillin (AMOXIL) 500 MG capsule TAKE 4 CAPSULES (2,000 MG TOTAL) BY MOUTH AS DIRECTED. TAKE 1 HOUR PRIOR TO DENTAL VISITS.   .Marland Kitchenapixaban (ELIQUIS) 2.5 MG TABS tablet Take 1 tablet (2.5 mg total) by mouth 2 (two) times daily.   .Marland Kitchenatorvastatin (LIPITOR) 80 MG tablet Take 0.5 tablets (40 mg total) by mouth at bedtime. 05/12/2020: Has the 441mtablet  . Blood Glucose Monitoring Suppl (CONTOUR NEXT EZ) w/Device KIT 1 kit by Does not apply route daily. Use once a Dody Smartt to check blood sugar.  Dx code: E11.9   . glucose blood (CONTOUR NEXT TEST) test strip Use once a Jamaya Sleeth to check blood sugar.  Dx code: E11.9   . glyBURIDE (DIABETA) 5 MG tablet TAKE 1/2  TABLET (2.5 MG TOTAL) BY MOUTH 2 (TWO) TIMES DAILY WITH A MEAL. (Patient taking differently: Take 1/2 tab before breakfast) 05/12/2020: Taking 1/2 tab once daily with evening meal  . losartan (COZAAR) 100 MG tablet Take 0.5 tablets (50 mg total) by mouth 2 (two) times daily.   . metFORMIN (GLUCOPHAGE) 500 MG tablet TAKE 2 TABLETS BY MOUTH EVERY MORNING THEN TAKE 1 TABLET BY MOUTH EVERY EVENING 10/27/2020: Changed to 1 tab twice daily on 09/26/20  . spironolactone (ALDACTONE) 25 MG tablet Take 0.5 tablets (12.5 mg total) by mouth daily. (Patient taking differently: Take 12.5 mg by mouth at bedtime. )    No facility-administered encounter medications on file as of 10/27/2020.   SDOH Screenings   Alcohol Screen: Not on file  Depression (PHQ2-9): Low Risk   . PHQ-2 Score: 0  Financial Resource Strain: Low Risk   . Difficulty of Paying Living Expenses: Not very hard  Food Insecurity: No Food Insecurity  . Worried About RuCharity fundraisern the Last Year: Never true  . Ran Out of Food in the Last Year: Never true  Housing: Low Risk   . Last Housing Risk Score: 0  Physical Activity: Sufficiently Active  . Days of Exercise per Week: 3 days  . Minutes of Exercise per Session: 150+ min  Social Connections: Not on file  Stress: Not on file  Tobacco Use: Medium Risk  . Smoking Tobacco Use:  Former Smoker  . Smokeless Tobacco Use: Never Used  Transportation Needs: No Transportation Needs  . Lack of Transportation (Medical): No  . Lack of Transportation (Non-Medical): No     Current Diagnosis/Assessment:    Goals Addressed            This Visit's Progress   . Chronic Care Management Pharmacy Care Plan       CARE PLAN ENTRY (see longitudinal plan of care for additional care plan information)  Current Barriers:  . Chronic Disease Management support, education, and care coordination needs related to Hypertension, Hyperlipidemia, Diabetes, Atrial Fibrillation and Coronary Artery Disease     Hypertension BP Readings from Last 3 Encounters:  09/26/20 (!) 142/80  08/24/20 (!) 162/60  01/27/20 (!) 156/62   . Pharmacist Clinical Goal(s): o Over the next 90 days, patient will work with PharmD and providers to achieve BP goal <140/90 . Current regimen:  . Amlodipine 5 mg daily AM before breakfast . Losartan 100 mg 1/2 tab BID  . Spironolactone 25 mg 1/2 tab daily AM . Interventions: o Requested patient to record BP . Patient self care activities - Over the next 90 days, patient will: o Check BP 1-2 times per week, document, and provide at future appointments o Ensure daily salt intake < 2300 mg/Christal Lagerstrom  Hyperlipidemia Lab Results  Component Value Date/Time   LDLCALC 72 07/29/2019 08:27 AM   . Pharmacist Clinical Goal(s): o Over the next 90 days, patient will work with PharmD and providers to achieve LDL goal < 70 . Current regimen:  o Atorvastatin 40 mg daily . Interventions: o Discussed importance of diet and reducing consumption of cholesterol . Patient self care activities - Over the next 90 days, patient will: o Reduce consumption of cholesterol  Diabetes Lab Results  Component Value Date/Time   HGBA1C 7.7 (H) 09/26/2020 11:34 AM   HGBA1C 8.4 (H) 10/01/2019 09:48 AM   . Pharmacist Clinical Goal(s): o Over the next 90 days, patient will work with PharmD and providers to maintain A1c goal <8% . Current regimen:  . Glyburide 5 mg 1/2 tab BID  . Metformin 500 mg 1 tab AM, 1 tab PM . Interventions: o Requested patient to check blood sugar 2 hours after meal a few times per week to assess if post prandial blood sugar is above goal o Discussed DM goals o Continue glyburide 1/2 tab with breakfast and dinner . Patient self care activities - Over the next 90 days, patient will: o Check blood sugar once daily, document, and provide at future appointments o Contact provider with any episodes of hypoglycemia  Health Maintenance  . Pharmacist Clinical Goal(s) o Over  the next 90 days, patient will work with PharmD and providers to complete health maintenance screenings/vaccinations . Interventions: o Consider completing Shingrix vaccine series (completed both vaccines) . Patient self care activities - Over the next 90 days, patient will: o Complete Shingrix vaccine series (completed on 08/26/20)  Medication management . Pharmacist Clinical Goal(s): o Over the next 90 days, patient will work with PharmD and providers to maintain optimal medication adherence . Current pharmacy: [facility] . Interventions o Comprehensive medication review performed. o Continue current medication management strategy . Patient self care activities - Over the next 90 days, patient will: o Focus on medication adherence by filling and taking medications appropriately  o Take medications as prescribed o Report any questions or concerns to PharmD and/or provider(s)  Please see past updates related to this goal  by clicking on the "Past Updates" button in the selected goal       Social Hx: Moved here from [place] to be closer to their kids.  Moved here 6 years ago. Only community outside of children is church, but this has been put on hold due to Marcus. Married 65 years.  Has 2 sons and 2 granddaughters.   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  Just celebrated 30 year anniversary yesterday.   Hypertension   Blood Pressure Goal: <140/90  CMP Latest Ref Rng & Units 08/24/2020 01/27/2020 10/01/2019  Glucose 65 - 99 mg/dL 204(H) 244(H) 282(H)  BUN 8 - 27 mg/dL 23 23 30(H)  Creatinine 0.76 - 1.27 mg/dL 1.58(H) 1.63(H) 1.44  Sodium 134 - 144 mmol/L 139 135 136  Potassium 3.5 - 5.2 mmol/L 5.0 5.0 4.4  Chloride 96 - 106 mmol/L 100 98 101  CO2 20 - 29 mmol/L _0 Calcium 8.6 - 10.2 mg/dL 9.2 9.3 9.0  Total Protein 6.0 - 8.5 g/dL 6.4 6.5 -  Total Bilirubin 0.0 - 1.2 mg/dL 0.4 0.4 -  Alkaline Phos  44 - 121 IU/L 75 73 -  AST 0 - 40 IU/L 25 28 -  ALT 0 - 44 IU/L 41 39 -   Kidney Function Lab Results  Component Value Date/Time   CREATININE 1.58 (H) 08/24/2020 08:58 AM   CREATININE 1.63 (H) 01/27/2020 08:22 AM   CREATININE 1.02 07/08/2014 11:18 AM   GFR 46.56 (L) 10/01/2019 09:48 AM   GFRNONAA 39 (L) 08/24/2020 08:58 AM   GFRNONAA 69 07/08/2014 11:18 AM   GFRAA 45 (L) 08/24/2020 08:58 AM   GFRAA 80 07/08/2014 11:18 AM   K 5.0 08/24/2020 08:58 AM   K 5.0 01/27/2020 08:22 AM   Office blood pressures are  BP Readings from Last 3 Encounters:  09/26/20 (!) 142/80  08/24/20 (!) 162/60  01/27/20 (!) 156/62   Patient checks BP at home daily Patient home BP readings are ranging: 130s-140s/60s heart rate usually runs per patient report Feels his BP are lower in the evening vs the morning when his BP is checked during office visits  Patient has failed these meds in the past: None noted  Patient is currently borderline controlled per home BP on the following medications:  . Amlodipine 5 mg daily AM before breakfast . Losartan 100 mg 1/2 tab BID  . Spironolactone 25 mg 1/2 tab daily AM  Patient checked BP while on phone. BP was 138/56 with pulse of 59. Patient checked BG while on 176 at 10:55am (breakfast was at 7am) We discussed Blood pressure goal   Update 09/13/20 144 54 60 b = after breakfast 133 56 52 l = after lunch 140 56 59 b 136 59 57 l 151 63 60 D = after dinner 149 60 65 d 145 56 64 D Avg 142.5 57.7 59.5  Initially had a cough, but this resolved after his covid shot. Recurred about a month ago. Relieved with cough drop. Dry cough. Denies headache and fever. Reports fatigue.  Could consider covid testing. Weight today is 164lbs, feels he is usually about 168lbs.  Low suspicion of losartan as cause, but this could probable.   Update 10/27/20 Realizes that when he goes out and does work his blood pressure goes down. States he had an ultrasound of his kidneys  performed and he states that went well. Averages trending up, but note  caution with over treatment considering his age.  He feels his BP is higher when he comes back from driving. He states he feels very great. Will continue to monitor.  _0 63 56 161 60 53 136 52 56 Avg 150.5 58.1 54.6  Denies headaches, chest pain, and dizziness  Plan -Continue current medications    Diabetes   A1c goal <8% (considering age and comorbidities) FBG goal 90-150 PPBG goal <180  Recent Relevant Labs: Lab Results  Component Value Date/Time   HGBA1C 7.7 (H) 09/26/2020 11:34 AM   HGBA1C 8.4 (H) 10/01/2019 09:48 AM   GFR 46.56 (L) 10/01/2019 09:48 AM   GFR 44.79 (L) 05/29/2019 08:16 AM    Last diabetic Eye exam:  Lab Results  Component Value Date/Time   HMDIABEYEEXA No Retinopathy 05/20/2015 12:00 AM    Last diabetic Foot exam: No results found for: HMDIABFOOTEX   Checking BG: Daily  Recent FBG Readings: States he has a new meter  103  85  87  74  85  83  121 had a big dinner night before 69  68  113  87  76  126 had a big dinner night before 85  85  80 Average  89.1875  Patient has failed these meds in past: Januvia (cost), glipizide (heart racing) Patient is currently controlled on the following medications: . Glyburide 5 mg 1/2 tab BID . Metformin 500 mg 1 tab AM, 1 tab PM (decreased on 09/26/20 per [place] due to renal)  States he moved glyburide dose to PM vs AM due to having hypoglycemia in the middle of the night when he took the the dose in the AM.  Feels hypoglycemia symptoms when his BG is less than 60. 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   We discussed: diet and exercise extensively, DM goals, how his fasting BG are at goal, but there is a high probability that his post prandial blood sugars are above goal noting his a1c and glucose levels from most recent cmp.    Update 10/26/_1 Avg [id number]  Fasting 2 hrs after breakfast 2 hrs after lunch 2 hrs after dinner  Avg      103.5 199 166 183    BG elevated from last visit, but still improved compared to initial visit. States he went to a kidney doctor and was told he needed to have an ultrasound of his kidney. This is scheduled for next Friday (09/23/20?) Updated a1c at [facility]? No (Need to schedule visit w/ Dr. [name]) Reports a low this morning and another low a couple of days ago. Treats his lows with orange juice  Update 10/27/20  Fasting 2 hrs after breakfast 2 hrs after lunch 2 hrs after dinner  Avg      154 197 --- 174    Patient's a1c  now to goal! Congratulated patient on this. States he is staying away from breads. Did move his metformin down to one twice daily. Discussed option of Farxiga for diabetes and kidney function with option for PAP to assist with cost, but pt would not like to add more medications at this time. He might be open to this option in the future.  Patient would like more information about following a diabetic diet.  Will provide resources of "Carb counting and meal planning" and "Diabetes medicines". Both are booklets provided from [employer].   He states he is very grateful for the help that's been provided.  Plan -Continue current medications   Future Plan -Consider starting farxiga 61m  daily to assist with hyperglycemia and CKD (will have to complete PAP as cost has been barrier in past with branded medications). Can potentially D/C glyburide once Farxiga at steady state.   Vaccines   Reviewed and discussed patient's vaccination history.    Immunization History  Administered Date(s) Administered  . Fluad Quad(high Dose 65+) 08/12/2019, 08/12/2020  . Influenza, High Dose Seasonal PF 08/25/2015, 09/02/2017, 08/25/2018  . Influenza,inj,Quad PF,6+ Mos 09/01/2014, 08/27/2016  . Moderna SARS-COV2 Booster Vaccination 09/26/2020  . Moderna SARS-COVID-2 Vaccination 12/23/2019, 01/20/2020  . Pneumococcal Conjugate-13 10/04/2014  . Pneumococcal Polysaccharide-23 10/10/2015  . Tdap 01/05/2015  . Zoster Recombinat (Shingrix) 05/27/2020, 08/26/2020   [facility]  Update 09/13/20 Received 2nd Shingrix vaccine? Yes on 08/26/20  Update 10/27/20 Patient is now up to date on all vaccines  KMelvenia BeamDay, PharmD, BCACP Clinical Pharmacist [phone]

## 2020-11-22 ENCOUNTER — Telehealth: Payer: Self-pay | Admitting: Pharmacist

## 2020-11-22 NOTE — Progress Notes (Addendum)
Chronic Care Management Pharmacy Assistant   Name: Peter Scott  MRN: [id number] DOB: [birth year]  Reason for Encounter: HTN Disease State  Patient Questions:  1.  Have you seen any other providers since your last visit? No  2.  Any changes in your medicines or health? No   PCP : Scott, Peter Filler, MD   Their chronic conditions include: Hypertension, Hyperlipidemia, Diabetes, Atrial Fibrillation and Coronary Artery Disease.  Office Visits: None since last CCM visit on 10/27/20  Consults: None since last CCM visit on 10/27/20.  Allergies:   Allergies  Allergen Reactions   Glipizide Palpitations    "Heart racing" "Heart racing"    Medications: Outpatient Encounter Medications as of 11/22/2020  Medication Sig Note   acetaminophen (TYLENOL) 500 MG tablet Take 1 tablet (500 mg total) by mouth every 6 (six) hours as needed. (Patient taking differently: Take 500 mg by mouth every 6 (six) hours as needed for moderate pain or headache. )    amiodarone (PACERONE) 200 MG tablet Take 1 tablet (200 mg total) by mouth daily.    amLODipine (NORVASC) 5 MG tablet Take 1 tablet (5 mg total) by mouth daily.    amoxicillin (AMOXIL) 500 MG capsule TAKE 4 CAPSULES (2,000 MG TOTAL) BY MOUTH AS DIRECTED. TAKE 1 HOUR PRIOR TO DENTAL VISITS.    apixaban (ELIQUIS) 2.5 MG TABS tablet Take 1 tablet (2.5 mg total) by mouth 2 (two) times daily.    atorvastatin (LIPITOR) 80 MG tablet Take 0.5 tablets (40 mg total) by mouth at bedtime. 05/12/2020: Has the 13m tablet   Blood Glucose Monitoring Suppl (CONTOUR NEXT EZ) w/Device KIT 1 kit by Does not apply route daily. Use once a day to check blood sugar.  Dx code: E11.9    glucose blood (CONTOUR NEXT TEST) test strip Use once a day to check blood sugar.  Dx code: E11.9    glyBURIDE (DIABETA) 5 MG tablet TAKE 1/2 TABLET (2.5 MG TOTAL) BY MOUTH 2 (TWO) TIMES DAILY WITH A MEAL. (Patient taking differently: Take 1/2 tab before breakfast) 05/12/2020: Taking 1/2 tab  once daily with evening meal   losartan (COZAAR) 100 MG tablet Take 0.5 tablets (50 mg total) by mouth 2 (two) times daily.    metFORMIN (GLUCOPHAGE) 500 MG tablet TAKE 2 TABLETS BY MOUTH EVERY MORNING THEN TAKE 1 TABLET BY MOUTH EVERY EVENING 10/27/2020: Changed to 1 tab twice daily on 09/26/20   spironolactone (ALDACTONE) 25 MG tablet Take 0.5 tablets (12.5 mg total) by mouth daily. (Patient taking differently: Take 12.5 mg by mouth at bedtime. )    No facility-administered encounter medications on file as of 11/22/2020.    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 Spoke with patient regarding BP  Recent Office Vitals: BP Readings from Last 3  Encounters:  09/26/20 (!) 142/80  08/24/20 (!) 162/60  01/27/20 (!) 156/62   Pulse Readings from Last 3 Encounters:  09/26/20 (!) 54  08/24/20 70  01/27/20 (!) 53    Wt Readings from Last 3 Encounters:  09/26/20 171 lb (77.6 kg)  08/24/20 171 lb 1.9 oz (77.6 kg)  01/27/20 174 lb 6.4 oz (79.1 kg)     Kidney Function Lab Results  Component Value Date/Time   CREATININE 1.58 (H) 08/24/2020 08:58 AM   CREATININE 1.63 (H) 01/27/2020 08:22 AM   CREATININE 1.02 07/08/2014 11:18 AM   GFR 46.56 (L) 10/01/2019 09:48 AM   GFRNONAA 39 (L) 08/24/2020 08:58 AM   GFRNONAA 69  07/08/2014 11:18 AM   GFRAA 45 (L) 08/24/2020 08:58 AM   GFRAA 80 07/08/2014 11:18 AM    BMP Latest Ref Rng & Units 08/24/2020 01/27/2020 10/01/2019  Glucose 65 - 99 mg/dL 204(H) 244(H) 282(H)  BUN 8 - 27 mg/dL 23 23 30(H)  Creatinine 0.76 - 1.27 mg/dL 1.58(H) 1.63(H) 1.44  BUN/Creat Ratio 10 - _0 -  Sodium 134 - 144 mmol/L 139 135 136  Potassium 3.5 - 5.2 mmol/L 5.0 5.0 4.4  Chloride 96 - 106 mmol/L 100 98 101  CO2 20 - 29 mmol/L _1 Calcium 8.6 - 10.2 mg/dL 9.2 9.3 9.0    Current antihypertensive regimen:  Amlodipine 5 mg daily AM before breakfast Losartan 100 mg 1/2 tab BID  Spironolactone 25 mg 1/2 tab daily AM  How often are you checking your Blood Pressure? daily   Current home BP readings: 145/52, 151/62, 144/58 are the last three readings provided by the patient  What recent interventions/DTPs have been made by any provider to improve Blood Pressure control since last CPP Visit: None  Any recent hospitalizations or ED visits since last visit with CPP? No   What diet changes have been made to improve Blood Pressure Control?  Patient has reduced his carbohydrates and cut sugars.  What exercise is being done to improve your Blood Pressure Control?  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  Spoke with the patient about some work that he does on his son's property. He really enjoys being active. He shared with me stories of things he has built and his upbringing in [place].  Adherence Review: Is the patient currently on ACE/ARB medication? Yes Losartan 100 mg 0.5 tab BID Does the patient have >5 day gap between last estimated fill dates? No (losartan not listed in dispense report, but suspect patient gets this from the [place] which would not be captured by insurance claims data)   Follow-Up:  Pharmacist Review   Peter Scott, [place] Pharmacist Assistant [phone]  5 minutes spent in review, coordination, and documentation.    Reviewed by: De Blanch, PharmD, BCACP Clinical Pharmacist [facility] [phone]

## 2020-11-23 DIAGNOSIS — C4442 Squamous cell carcinoma of skin of scalp and neck: Secondary | ICD-10-CM | POA: Diagnosis not present

## 2020-11-23 DIAGNOSIS — L570 Actinic keratosis: Secondary | ICD-10-CM | POA: Diagnosis not present

## 2020-11-23 DIAGNOSIS — L814 Other melanin hyperpigmentation: Secondary | ICD-10-CM | POA: Diagnosis not present

## 2020-11-23 DIAGNOSIS — C44329 Squamous cell carcinoma of skin of other parts of face: Secondary | ICD-10-CM | POA: Diagnosis not present

## 2020-11-23 DIAGNOSIS — X32XXXS Exposure to sunlight, sequela: Secondary | ICD-10-CM | POA: Diagnosis not present

## 2020-12-14 ENCOUNTER — Other Ambulatory Visit: Payer: Self-pay | Admitting: Family Medicine

## 2020-12-14 DIAGNOSIS — E119 Type 2 diabetes mellitus without complications: Secondary | ICD-10-CM

## 2020-12-14 DIAGNOSIS — Z85828 Personal history of other malignant neoplasm of skin: Secondary | ICD-10-CM | POA: Diagnosis not present

## 2020-12-14 NOTE — Telephone Encounter (Signed)
Can you verify the dosage of the glyburide- should he be taking 1/2 tab once daily or 1/2 tab bid?

## 2020-12-15 ENCOUNTER — Other Ambulatory Visit: Payer: Self-pay | Admitting: Family Medicine

## 2020-12-15 MED FILL — glyBURIDE 5 MG TABS: 5 | 90 days supply | Qty: 45 | Fill #0

## 2021-01-27 ENCOUNTER — Other Ambulatory Visit: Payer: Self-pay | Admitting: Physician Assistant

## 2021-01-30 ENCOUNTER — Other Ambulatory Visit: Payer: Self-pay

## 2021-01-30 DIAGNOSIS — K1321 Leukoplakia of oral mucosa, including tongue: Secondary | ICD-10-CM | POA: Diagnosis not present

## 2021-01-30 DIAGNOSIS — L814 Other melanin hyperpigmentation: Secondary | ICD-10-CM | POA: Diagnosis not present

## 2021-01-30 DIAGNOSIS — X32XXXS Exposure to sunlight, sequela: Secondary | ICD-10-CM | POA: Diagnosis not present

## 2021-01-30 DIAGNOSIS — L570 Actinic keratosis: Secondary | ICD-10-CM | POA: Diagnosis not present

## 2021-01-30 DIAGNOSIS — Z85828 Personal history of other malignant neoplasm of skin: Secondary | ICD-10-CM | POA: Diagnosis not present

## 2021-02-06 ENCOUNTER — Other Ambulatory Visit: Payer: Self-pay | Admitting: *Deleted

## 2021-02-06 ENCOUNTER — Encounter: Payer: Self-pay | Admitting: Family Medicine

## 2021-02-06 ENCOUNTER — Other Ambulatory Visit: Payer: Self-pay | Admitting: Physician Assistant

## 2021-02-06 MED ORDER — GLUCOSE BLOOD VI STRP: ORAL_STRIP | 12 refills | Status: DC

## 2021-02-06 MED ORDER — CONTOUR NEXT TEST VI STRP
ORAL_STRIP | 12 refills | Status: DC
Start: 2021-02-06 — End: 2021-02-08

## 2021-02-06 MED ORDER — AMOXICILLIN 500 MG PO CAPS: ORAL_CAPSULE | ORAL | 6 refills | Status: DC

## 2021-02-06 MED FILL — AMOXICILLIN 500 MG CAPSULE: 500 | 2 days supply | Qty: 8 | Fill #0

## 2021-02-08 ENCOUNTER — Other Ambulatory Visit: Payer: Self-pay

## 2021-02-08 MED ORDER — CONTOUR NEXT TEST VI STRP: ORAL_STRIP | 12 refills | Status: AC

## 2021-02-14 ENCOUNTER — Telehealth: Payer: Self-pay

## 2021-02-14 NOTE — Telephone Encounter (Signed)
-----   Message from [name], MD sent at 02/13/2021 11:31 AM EDT ----- Can you please check on his test strips? Apparently insurance is not wanting to cover them

## 2021-02-16 NOTE — Progress Notes (Signed)
    HPI: FU CAD andAS. Patient is status post coronary artery bypass graft in [place] in 2001. He also had ablation of what sounds to be SVT at that time. Carotid Dopplers March 2019 showed 1 to 39% bilateral stenosis. Patient had preoperative cardiac catheterization that revealed severe native three-vessel coronary artery disease with patency of grafts. He underwent TAVR 5/19.Also with h/o PAF.Abd ultrasound 2/21 showed no AAA.Echocardiogram April 2021 showed normal LV function, mild mitral regurgitation, previous TAVR with trivial perivalvular aortic insufficiency.  Since last seen,he denies dyspnea, chest pain, palpitations or syncope.  Current Outpatient Medications  Medication Sig Dispense Refill  . acetaminophen (TYLENOL) 500 MG tablet Take 1 tablet (500 mg total) by mouth every 6 (six) hours as needed. (Patient taking differently: Take 500 mg by mouth every 6 (six) hours as needed for moderate pain or headache.) 30 tablet 0  . amiodarone (PACERONE) 200 MG tablet Take 1 tablet (200 mg total) by mouth daily. 90 tablet 3  . amLODipine (NORVASC) 5 MG tablet Take 1 tablet (5 mg total) by mouth daily.    . amoxicillin (AMOXIL) 500 MG capsule TAKE 4 CAPSULES BY MOUTH AS DIRECTED ONE HOUR PRIOR TO DENTAL VISITS 8 capsule 6  . apixaban (ELIQUIS) 2.5 MG TABS tablet Take 1 tablet (2.5 mg total) by mouth 2 (two) times daily.    . atorvastatin (LIPITOR) 80 MG tablet Take 0.5 tablets (40 mg total) by mouth at bedtime. 90 tablet 1  . Blood Glucose Monitoring Suppl (CONTOUR NEXT EZ) w/Device KIT 1 kit by Does not apply route daily. Use once a day to check blood sugar.  Dx code: E11.9 1 kit 0  . COVID-19 mRNA vaccine, Moderna, 100 MCG/0.5ML injection INJECT AS DIRECTED .25 mL 0  . glucose blood (CONTOUR NEXT TEST) test strip Use once a day to check blood sugar.  Dx code: E11.9 100 each 12  . glucose blood test strip Check blood sugars once daily 100 each 12  . glyBURIDE (DIABETA) 5 MG tablet TAKE  ONE-HALF TABLET BY MOUTH BEFORE BREAKFAST 45 tablet 3  . losartan (COZAAR) 100 MG tablet Take 0.5 tablets (50 mg total) by mouth 2 (two) times daily. 90 tablet 1  . metFORMIN (GLUCOPHAGE) 500 MG tablet TAKE 2 TABLETS BY MOUTH EVERY MORNING THEN TAKE 1 TABLET BY MOUTH EVERY EVENING 270 tablet 1  . spironolactone (ALDACTONE) 25 MG tablet Take 0.5 tablets (12.5 mg total) by mouth daily. (Patient taking differently: Take 12.5 mg by mouth at bedtime.) 45 tablet 1  . spironolactone (ALDACTONE) 25 MG tablet Take 25 mg by mouth daily. Patient takes 1 tablet in the morning and 1/2 tablet in the evening     No current facility-administered medications for this visit.     Past Medical History:  Diagnosis Date  . Aortic aneurysm (HCC)   . Aortic stenosis   . CAD (coronary artery disease)   . Diabetes mellitus type II, controlled (HCC)   . Heart disease    Ablation  . History of chicken pox   . Hyperlipidemia   . Hypertension   . S/P CABG x 4    2001 - [place]  . 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 Umbilical hernia     Past Surgical History:  Procedure Laterality Date  . ABLATION     Rhythm problem; rhythm unknown; [place]  . CARDIOVERSION   N/A 07/14/2018   Procedure: CARDIOVERSION;  Surgeon: Baune, Katarina H, MD;  Location: [facility];  Service: Cardiovascular;  Laterality: N/A;  . CARDIOVERSION N/A 08/27/2018   Procedure: CARDIOVERSION;  Surgeon: Skains, Mark C, MD;  Location: [facility];  Service: Cardiovascular;  Laterality: N/A;  . CORONARY ARTERY BYPASS GRAFT  2001  . EXPLORATION POST OPERATIVE OPEN HEART  2001, June  . INTRAOPERATIVE TRANSTHORACIC ECHOCARDIOGRAM N/A 04/01/2018   Procedure: INTRAOPERATIVE TRANSTHORACIC ECHOCARDIOGRAM;  Surgeon: McAlhany, Christopher D, MD;  Location: [facility] OR;  Service: Open Heart Surgery;  Laterality: N/A;  .  RIGHT/LEFT HEART CATH AND CORONARY/GRAFT ANGIOGRAPHY N/A 01/31/2018   Procedure: RIGHT/LEFT HEART CATH AND CORONARY/GRAFT ANGIOGRAPHY;  Surgeon: McAlhany, Christopher D, MD;  Location: [facility];  Service: Cardiovascular;  Laterality: N/A;  . TEE WITHOUT CARDIOVERSION N/A 01/17/2018   Procedure: TRANSESOPHAGEAL ECHOCARDIOGRAM (TEE);  Surgeon: Crenshaw, Brian S, MD;  Location: [facility];  Service: Cardiovascular;  Laterality: N/A;  . TEE WITHOUT CARDIOVERSION N/A 07/14/2018   Procedure: TRANSESOPHAGEAL ECHOCARDIOGRAM (TEE);  Surgeon: Cathy, Katarina H, MD;  Location: [facility];  Service: Cardiovascular;  Laterality: N/A;  . TONSILLECTOMY    . TRANSCATHETER AORTIC VALVE REPLACEMENT, TRANSFEMORAL N/A 04/01/2018   Procedure: TRANSCATHETER AORTIC VALVE REPLACEMENT, TRANSFEMORAL;  Surgeon: McAlhany, Christopher D, MD;  Location: [facility];  Service: Open Heart Surgery;  Laterality: N/A;  . UMBILICAL HERNIA REPAIR  2015    Social History   Socioeconomic History  . Marital status: Married    Spouse name: Not on file  . Number of children: 2  . Years of education: Not on file  . Highest education level: Not on file  Occupational History  . Not on file  Tobacco Use  . Smoking status: Former Smoker    Packs/day: 1.00    Years: 3.00    Pack years: 3.00  . Smokeless tobacco: Never Used  . Tobacco comment: quit tobbaco in his 20s  Vaping Use  . Vaping Use: Never used  Substance and Sexual Activity  . Alcohol use: No    Alcohol/week: 0.0 standard drinks  . Drug use: No  . Sexual activity: Not on file  Other Topics Concern  . Not on file  Social History Narrative  . Not on file   Social Determinants of Health   Financial Resource Strain: Low Risk   . Difficulty of Paying Living Expenses: Not very hard  Food Insecurity: No Food Insecurity  . Worried About Running Out of Food in the Last Year: Never true  . Ran Out of Food in the Last Year: Never true  Transportation Needs: No  Transportation Needs  . Lack of Transportation (Medical): No  . Lack of Transportation (Non-Medical): No  Physical Activity: Sufficiently Active  . Days of Exercise per Week: 3 days  . Minutes of Exercise per Session: 150+ min  Stress: Not on file  Social Connections: Not on file  Intimate Partner Violence: Not on file    Family History  Problem Relation Age of Onset  . Diabetes Mother 87       Deceased  . Heart disease Father 78       Deceased  . Heart disease Maternal Uncle   . Diabetes Maternal Uncle   . Heart disease Brother   . Diabetes Brother   . Diabetes Sister        #1  . Heart disease Sister        #2  . Hyperlipidemia Son          x2    ROS: no fevers or chills, productive cough, hemoptysis, dysphasia, odynophagia, melena, hematochezia, dysuria, hematuria, rash, seizure activity, orthopnea, PND, pedal edema, claudication. Remaining systems are negative.  Physical Exam: Well-developed well-nourished in no acute distress.  Skin is warm and dry.  HEENT is normal.  Neck is supple.  Chest is clear to auscultation with normal expansion.  Cardiovascular exam is regular rate and rhythm.  2/6 systolic murmur left sternal border.  No diastolic murmur. Abdominal exam nontender or distended. No masses palpated. Extremities show no edema. neuro grossly intact   A/P  1 paroxysmal atrial fibrillation-patient remains in sinus on exam.  Continue amiodarone and apixaban.  2 hypertension-blood pressure controlled.  Continue present medications.  3 hyperlipidemia-continue statin.  4 previous TAVR-plan to continue SBE prophylaxis.  5 Coronary artery disease-continue statin.  No aspirin given the for apixaban.  6 question history of abdominal aortic aneurysm-this was not evident on most recent imaging.  Brian Crenshaw, MD    

## 2021-02-22 ENCOUNTER — Ambulatory Visit (INDEPENDENT_AMBULATORY_CARE_PROVIDER_SITE_OTHER): Payer: Medicare Other | Admitting: Cardiology

## 2021-02-22 ENCOUNTER — Encounter: Payer: Self-pay | Admitting: Family Medicine

## 2021-02-22 ENCOUNTER — Other Ambulatory Visit: Payer: Self-pay

## 2021-02-22 ENCOUNTER — Encounter: Payer: Self-pay | Admitting: Cardiology

## 2021-02-22 VITALS — BP 144/62 | HR 69 | Ht 69.0 in | Wt 169.8 lb

## 2021-02-22 DIAGNOSIS — I480 Paroxysmal atrial fibrillation: Secondary | ICD-10-CM

## 2021-02-22 DIAGNOSIS — I10 Essential (primary) hypertension: Secondary | ICD-10-CM | POA: Diagnosis not present

## 2021-02-22 DIAGNOSIS — E785 Hyperlipidemia, unspecified: Secondary | ICD-10-CM | POA: Diagnosis not present

## 2021-02-22 DIAGNOSIS — Z952 Presence of prosthetic heart valve: Secondary | ICD-10-CM

## 2021-02-22 DIAGNOSIS — Z951 Presence of aortocoronary bypass graft: Secondary | ICD-10-CM | POA: Diagnosis not present

## 2021-02-22 NOTE — Patient Instructions (Signed)

## 2021-03-02 ENCOUNTER — Other Ambulatory Visit (HOSPITAL_COMMUNITY): Payer: Self-pay

## 2021-03-08 ENCOUNTER — Telehealth: Payer: Medicare Other

## 2021-03-08 ENCOUNTER — Other Ambulatory Visit (HOSPITAL_BASED_OUTPATIENT_CLINIC_OR_DEPARTMENT_OTHER): Payer: Self-pay

## 2021-03-08 MED FILL — Metformin HCl Tab 500 MG: ORAL | 90 days supply | Qty: 270 | Fill #0 | Status: AC

## 2021-03-15 ENCOUNTER — Other Ambulatory Visit (HOSPITAL_BASED_OUTPATIENT_CLINIC_OR_DEPARTMENT_OTHER): Payer: Self-pay

## 2021-03-15 MED FILL — Glyburide Tab 5 MG: ORAL | 30 days supply | Qty: 45 | Fill #0 | Status: AC

## 2021-03-22 ENCOUNTER — Other Ambulatory Visit (HOSPITAL_COMMUNITY): Payer: Self-pay

## 2021-04-07 ENCOUNTER — Encounter: Payer: Self-pay | Admitting: Family Medicine

## 2021-04-07 MED ORDER — MOLNUPIRAVIR EUA 200MG CAPSULE: 4.0000 | ORAL_CAPSULE | Freq: Two times a day (BID) | ORAL | 0 refills | Status: DC

## 2021-04-07 NOTE — Telephone Encounter (Signed)
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  For the time being [name] prefers to manage conservatively at home.  I gave him my cell number and asked him to call me if he starts getting worse

## 2021-04-13 ENCOUNTER — Encounter: Payer: Self-pay | Admitting: Family Medicine

## 2021-04-14 ENCOUNTER — Telehealth: Payer: Self-pay | Admitting: Pharmacist

## 2021-04-14 NOTE — Chronic Care Management (AMB) (Signed)
Chronic Care Management Pharmacy Assistant   Name: Peter Scott  MRN: [id number] DOB: August 10, 1932   Reason for Encounter: Disease State Hypertension    Recent office visits:  None noted  Recent consult visits:  02/22/2021 Kirk Ruths, MD (Cardiology) Follow up CAD. Follow up in 6 months. 12/14/2020 Carlin Hollar (Dermatology) No notes provided. 11/23/2020 Vernie Ammons (Dermatology) No notes provided   Hospital visits:  None in previous 6 months  Medications: Outpatient Encounter Medications as of 04/14/2021  Medication Sig Note  . acetaminophen (TYLENOL) 500 MG tablet Take 1 tablet (500 mg total) by mouth every 6 (six) hours as needed. (Patient taking differently: Take 500 mg by mouth every 6 (six) hours as needed for moderate pain or headache.)   . amiodarone (PACERONE) 200 MG tablet Take 1 tablet (200 mg total) by mouth daily.   Marland Kitchen amLODipine (NORVASC) 5 MG tablet Take 1 tablet (5 mg total) by mouth daily.   Marland Kitchen amoxicillin (AMOXIL) 500 MG capsule TAKE 4 CAPSULES BY MOUTH AS DIRECTED ONE HOUR PRIOR TO DENTAL VISITS   . apixaban (ELIQUIS) 2.5 MG TABS tablet Take 1 tablet (2.5 mg total) by mouth 2 (two) times daily.   Marland Kitchen atorvastatin (LIPITOR) 80 MG tablet Take 0.5 tablets (40 mg total) by mouth at bedtime. 05/12/2020: Has the 32m tablet  . Blood Glucose Monitoring Suppl (CONTOUR NEXT EZ) w/Device KIT 1 kit by Does not apply route daily. Use once a day to check blood sugar.  Dx code: E11.9   . COVID-19 mRNA vaccine, Moderna, 100 MCG/0.5ML injection INJECT AS DIRECTED   . glucose blood (CONTOUR NEXT TEST) test strip Use once a day to check blood sugar.  Dx code: E11.9   . glucose blood test strip Check blood sugars once daily   . glyBURIDE (DIABETA) 5 MG tablet TAKE ONE-HALF TABLET BY MOUTH BEFORE BREAKFAST   . losartan (COZAAR) 100 MG tablet Take 0.5 tablets (50 mg total) by mouth 2 (two) times daily.   . metFORMIN (GLUCOPHAGE) 500 MG tablet TAKE 2 TABLETS BY MOUTH EVERY  MORNING THEN TAKE 1 TABLET BY MOUTH EVERY EVENING 10/27/2020: Changed to 1 tab twice daily on 09/26/20  . spironolactone (ALDACTONE) 25 MG tablet Take 0.5 tablets (12.5 mg total) by mouth daily. (Patient taking differently: Take 12.5 mg by mouth at bedtime.)   . spironolactone (ALDACTONE) 25 MG tablet Take 25 mg by mouth daily. Patient takes 1 tablet in the morning and 1/2 tablet in the evening    No facility-administered encounter medications on file as of 04/14/2021.    Reviewed chart prior to disease state call. Spoke with patient regarding BP  Recent Office Vitals: BP Readings from Last 3 Encounters:  02/22/21 (!) 144/62  09/26/20 (!) 142/80  08/24/20 (!) 162/60   Pulse Readings from Last 3 Encounters:  02/22/21 69  09/26/20 (!) 54  08/24/20 70    Wt Readings from Last 3 Encounters:  02/22/21 169 lb 12.8 oz (77 kg)  09/26/20 171 lb (77.6 kg)  08/24/20 171 lb 1.9 oz (77.6 kg)     Kidney Function Lab Results  Component Value Date/Time   CREATININE 1.58 (H) 08/24/2020 08:58 AM   CREATININE 1.63 (H) 01/27/2020 08:22 AM   CREATININE 1.02 07/08/2014 11:18 AM   GFR 46.56 (L) 10/01/2019 09:48 AM   GFRNONAA 39 (L) 08/24/2020 08:58 AM   GFRNONAA 69 07/08/2014 11:18 AM   GFRAA 45 (L) 08/24/2020 08:58 AM   GFRAA 80 07/08/2014 11:18 AM  BMP Latest Ref Rng & Units 08/24/2020 01/27/2020 10/01/2019  Glucose 65 - 99 mg/dL 204(H) 244(H) 282(H)  BUN 8 - 27 mg/dL 23 23 30(H)  Creatinine 0.76 - 1.27 mg/dL 1.58(H) 1.63(H) 1.44  BUN/Creat Ratio 10 - _0 -  Sodium 134 - 144 mmol/L 139 135 136  Potassium 3.5 - 5.2 mmol/L 5.0 5.0 4.4  Chloride 96 - 106 mmol/L 100 98 101  CO2 20 - 29 mmol/L _1 Calcium 8.6 - 10.2 mg/dL 9.2 9.3 9.0    . Current antihypertensive regimen:  o Losartan 100 mg take 0.5 mg tab twice daily o Amlodipine 5 mg take 1 tab daily o Spironolactone 25 mg take 0.5 tab daily  . How often are you checking your Blood Pressure? daily   . Current home BP  readings: Patient states his blood pressures range 130-140. Patient could not give exact readings. .  . What recent interventions/DTPs have been made by any provider to improve Blood Pressure control since last CPP Visit: None noted  . Any recent hospitalizations or ED visits since last visit with CPP? No   . What diet changes have been made to improve Blood Pressure Control?  o Patient states he has not made any diet changes.  . What exercise is being done to improve your Blood Pressure Control?  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  Adherence Review: Is the patient currently on ACE/ARB medication? Yes Does the patient have >5 day gap between last estimated fill dates? Yes   Star Rating Drugs: Atorvastatin 500 mg Last filled:07/02/2018 90 DS Losartan 100 mg Last filled:06/23/2018 90 DS Metformin 500 mg Last filled:03/08/2021 90 DS Glyburide 5 mg Last filled:03/15/2021 30 DS

## 2021-04-27 ENCOUNTER — Ambulatory Visit (INDEPENDENT_AMBULATORY_CARE_PROVIDER_SITE_OTHER): Payer: Medicare Other | Admitting: Pharmacist

## 2021-04-27 DIAGNOSIS — E1122 Type 2 diabetes mellitus with diabetic chronic kidney disease: Secondary | ICD-10-CM

## 2021-04-27 DIAGNOSIS — I2583 Coronary atherosclerosis due to lipid rich plaque: Secondary | ICD-10-CM | POA: Diagnosis not present

## 2021-04-27 DIAGNOSIS — I2510 Atherosclerotic heart disease of native coronary artery without angina pectoris: Secondary | ICD-10-CM

## 2021-04-27 DIAGNOSIS — N1832 Chronic kidney disease, stage 3b: Secondary | ICD-10-CM | POA: Diagnosis not present

## 2021-04-27 DIAGNOSIS — I480 Paroxysmal atrial fibrillation: Secondary | ICD-10-CM

## 2021-04-27 DIAGNOSIS — I10 Essential (primary) hypertension: Secondary | ICD-10-CM | POA: Diagnosis not present

## 2021-04-27 DIAGNOSIS — E785 Hyperlipidemia, unspecified: Secondary | ICD-10-CM

## 2021-04-27 NOTE — Patient Instructions (Signed)
Mr. Minturn It was a pleasure speaking with you today. Please feel free to contact me if you have any questions or concerns. Below is information regarding you health goals.   Keep up the good work!  Cherre Robins, PharmD Clinical Pharmacist [facility] [address] [place] [facility] [phone]  Visit Information  PATIENT GOALS:  Goals Addressed             This Visit's Progress    Chronic Care Management Pharmacy Care Plan   On track    CARE PLAN ENTRY (see longitudinal plan of care for additional care plan information)  Current Barriers:  Chronic Disease Management support, education, and care coordination needs related to Hypertension, Hyperlipidemia, Diabetes, Atrial Fibrillation and Coronary Artery Disease; Reduced kidney function   Hypertension BP Readings from Last 3 Encounters:  02/22/21 (!) 144/62  09/26/20 (!) 142/80  08/24/20 (!) 162/60  Pharmacist Clinical Goal(s): Over the next 90 days, patient will work with PharmD and providers to achieve BP goal <140/90 (home blood pressure readings have been at goal)  Current regimen:  Amlodipine 5 mg daily each morning before breakfast Losartan 100 mg - take 0.5 tablet twice daily Spironolactone 25 mg - take 0.5 tablet once a day Interventions: Discussed blood pressure goals  Discussed signs of low blood pressure (like dizziness, lightheadedness)  Reviewed home blood pressure readings Patient self care activities - Over the next 90 days, patient will: Continue to check blood pressure 2 ot 3 times per week, document, and provide at future appointments Report any symptoms of low blood pressure to provider Ensure daily salt intake < 2300 mg/day  Hyperlipidemia Lab Results  Component Value Date/Time   LDLCALC 72 07/29/2019 08:27 AM  Pharmacist Clinical Goal(s): Over the next 90 days, patient will work with PharmD and providers to achieve LDL goal < 70 Current regimen:  Atorvastatin 40 mg  daily Interventions: Discussed importance of diet and reducing consumption of cholesterol Reviewed exercise habits Discussed to always check dose of atorvastatin received from pharmacy - if 40mg  take 1 tablet if 80mg  will need to cut tablet in half to get 40mg  dose Patient self care activities - Over the next 90 days, patient will: Reduce consumption of cholesterol Continue to exercise regularly with goal of at least 150 minutes of moderate physical activity per week. Check atorvastatin strength each time you receive a new prescription from [place].   Diabetes Lab Results  Component Value Date/Time   HGBA1C 7.7 (H) 09/26/2020 11:34 AM   HGBA1C 8.4 (H) 10/01/2019 09:48 AM  A1c at [facility] on 01/19/2021 was 8.4%  Pharmacist Clinical Goal(s): Over the next 90 days, patient will work with PharmD and providers to achieve A1c goal <8% Current regimen:  Glyburide 5 mg - take 0.5 tablet each morning with breakfast   Metformin 500 mg - take 1 tablet with breakfast and 1 tablet with supper Interventions: Reviewed home blood glucose readings and reviewed goals  Fasting blood glucose goal (before meals) = 80 to 130 Blood glucose goal after a meal = less than 180  Patient self care activities - Over the next 90 days, patient will: Check blood sugar once daily, document, and provide at future appointments; Continue to check blood sugar 2 hours after meal a few times per week to assess if post prandial blood sugar is above goal Contact provider with any episodes of hypoglycemia  Atrial Fibrillation:  Pharmacist Clinical Goal(s): Over the next 90 days, patient will work with PharmD and providers to maintain  normal heart rhythm and prevent stroke Current regimen:  Amiodarone 200mg  once a day Eliquis 2.5mg  - take 2 tablet twice a day Patient self care activities - Over the next 90 days, patient will: Continue current regimen for atrial fibrillation  Health Maintenance  Pharmacist Clinical Goal(s) Over the  next 90 days, patient will work with PharmD and providers to complete health maintenance screenings/vaccinations Interventions: Consider completing Shingrix vaccine series (completed both vaccines) Patient self care activities - Over the next 90 days, patient will: Complete Shingrix vaccine series (completed on 08/26/20)  Medication management Pharmacist Clinical Goal(s): Over the next 90 days, patient will work with PharmD and providers to maintain optimal medication adherence Current pharmacy: [facility] Interventions Comprehensive medication review performed. Continue current medication management strategy Patient self care activities - Over the next 90 days, patient will: Focus on medication adherence by filling and taking medications appropriately  Take medications as prescribed Report any questions or concerns to PharmD and/or provider(s)  Please see past updates related to this goal by clicking on the "Past Updates" button in the selected goal           Patient verbalizes understanding of instructions provided today and agrees to view in [place].   Telephone follow up appointment with care management team member scheduled for: 3 months - phone call to check on BG and BP; 6 months with clinical pharmacist

## 2021-04-27 NOTE — Chronic Care Management (AMB) (Signed)
Chronic Care Management Pharmacy Note  04/27/2021 Name:  Peter Scott MRN:  [id number] DOB:  Oct 21, 1932  Summary:  Recommendations/Changes made from today's visit:  Plan:  Subjective: Peter Scott is an 87 y.o. year old male who is a primary patient of Copland, Gay Filler, MD.  The CCM team was consulted for assistance with disease management and care coordination needs.    Engaged with patient by telephone for follow up visit in response to provider referral for pharmacy case management and/or care coordination services.   Consent to Services:  The patient was given information about Chronic Care Management services, agreed to services, and gave verbal consent prior to initiation of services.  Please see initial visit note for detailed documentation.   Patient Care Team: Copland, Gay Filler, MD as PCP - General (Family Medicine) Hollar, Katharine Look, MD as Referring Physician (Dermatology) Cherre Robins, PharmD (Pharmacist)  Recent office visits: 09/26/2020 - PCP (Dr [name]) f/u visit. Recommended OTC loratadine for allergies.  Recent consult visits: 02/22/2021 - Cardio (Dr Mickel Fuchs) Afib follow up. No med changes.  Hospital visits: None in previous 6 months  Objective:  Lab Results  Component Value Date   CREATININE 1.58 (H) 08/24/2020   CREATININE 1.63 (H) 01/27/2020   CREATININE 1.44 10/01/2019    Lab Results  Component Value Date   HGBA1C 7.7 (H) 09/26/2020   Last diabetic Eye exam:  Lab Results  Component Value Date/Time   HMDIABEYEEXA No Retinopathy 05/20/2015 12:00 AM    Last diabetic Foot exam: No results found for: HMDIABFOOTEX      Component Value Date/Time   CHOL 133 07/29/2019 0827   TRIG 102 07/29/2019 0827   HDL 42 07/29/2019 0827   CHOLHDL 3.2 07/29/2019 0827   CHOLHDL 3 05/29/2019 0816   VLDL 17.6 05/29/2019 0816   LDLCALC 72 07/29/2019 0827    Hepatic Function Latest Ref Rng & Units 08/24/2020 01/27/2020 07/29/2019  Total Protein  6.0 - 8.5 g/dL 6.4 6.5 6.3  Albumin 3.6 - 4.6 g/dL 3.8 3.9 4.1  AST 0 - 40 IU/L _0 ALT 0 - 44 IU/L 41 39 45(H)  Alk Phosphatase 44 - 121 IU/L 75 73 64  Total Bilirubin 0.0 - 1.2 mg/dL 0.4 0.4 0.4  Bilirubin, Direct 0.00 - 0.40 mg/dL - - 0.13    Lab Results  Component Value Date/Time   TSH 3.010 08/24/2020 08:58 AM   TSH 2.230 01/27/2020 08:22 AM    CBC Latest Ref Rng & Units 08/24/2020 01/27/2020 07/29/2019  WBC 3.4 - 10.8 x10E3/uL 10.4 7.8 7.7  Hemoglobin 13.0 - 17.7 g/dL 12.2(L) 12.6(L) 13.2  Hematocrit 37.5 - 51.0 % 37.6 36.5(L) 40.2  Platelets 150 - 450 x10E3/uL 199 176 161    No results found for: VD25OH  Clinical ASCVD: Yes  The ASCVD Risk score Mikey Bussing DC Jr., et al., 2013) failed to calculate for the following reasons:   The 2013 ASCVD risk score is only valid for ages 41 to 74    Other: (CHADS2VASc if Afib, PHQ9 if depression, MMRC or CAT for COPD, ACT, DEXA)  Social History   Tobacco Use  Smoking Status Former   Packs/day: 1.00   Years: 3.00   Pack years: 3.00   Types: Cigarettes  Smokeless Tobacco Never  Tobacco Comments   quit tobbaco in his 20s   BP Readings from Last 3 Encounters:  02/22/21 (!) 144/62  09/26/20 (!) 142/80  08/24/20 (!) 162/60   Pulse Readings from  Last 3 Encounters:  02/22/21 69  09/26/20 (!) 54  08/24/20 70   Wt Readings from Last 3 Encounters:  02/22/21 169 lb 12.8 oz (77 kg)  09/26/20 171 lb (77.6 kg)  08/24/20 171 lb 1.9 oz (77.6 kg)    Assessment: Review of patient past medical history, allergies, medications, health status, including review of consultants reports, laboratory and other test data, was performed as part of comprehensive evaluation and provision of chronic care management services.   SDOH:  (Social Determinants of Health) assessments and interventions performed:  SDOH Interventions    Flowsheet Row Most Recent Value  SDOH Interventions   Financial Strain Interventions Intervention Not Indicated   Physical Activity Interventions Intervention Not Indicated       CCM Care Plan  Allergies  Allergen Reactions   Glipizide Palpitations    "Heart racing" "Heart racing"    Medications Reviewed Today     Reviewed by Cherre Robins, PharmD (Pharmacist) on 04/27/21 at [place] List Status: <None>   Medication Order Taking? Sig Documenting Provider Last Dose Status Informant  acetaminophen (TYLENOL) 500 MG tablet [id number] Yes Take 1 tablet (500 mg total) by mouth every 6 (six) hours as needed.  Patient taking differently: Take 500 mg by mouth every 6 (six) hours as needed for moderate pain or headache.   Nani Skillern, PA-C Taking Active   amiodarone (PACERONE) 200 MG tablet [id number] Yes Take 1 tablet (200 mg total) by mouth daily. Lelon Perla, MD Taking Active Self  amLODipine (NORVASC) 5 MG tablet [id number] Yes Take 1 tablet (5 mg total) by mouth daily. Eileen Stanford, PA-C Taking Active   amoxicillin (AMOXIL) 500 MG capsule [id number] Yes TAKE 4 CAPSULES BY MOUTH AS DIRECTED ONE HOUR PRIOR TO DENTAL VISITS Eileen Stanford, PA-C Taking Active   apixaban (ELIQUIS) 2.5 MG TABS tablet [id number] Yes Take 1 tablet (2.5 mg total) by mouth 2 (two) times daily. Lelon Perla, MD Taking Active   atorvastatin (LIPITOR) 40 MG tablet [id number] Yes Take 40 mg by mouth daily. [provider] Taking Active   Blood Glucose Monitoring Suppl (CONTOUR NEXT EZ) w/Device KIT [id number] Yes 1 kit by Does not apply route daily. Use once a day to check blood sugar.  Dx code: E11.9 Copland, Gay Filler, MD Taking Active   COVID-19 mRNA vaccine, Moderna, 100 MCG/0.5ML injection [id number]  INJECT AS DIRECTED Carlyle Basques, MD  Active   glucose blood (CONTOUR NEXT TEST) test strip [id number] Yes Use once a day to check blood sugar.  Dx code: E11.9 Copland, Gay Filler, MD Taking Active   glucose blood test strip [id number] Yes Check blood sugars once daily Copland, Gay Filler, MD  Taking Active   glyBURIDE (DIABETA) 5 MG tablet [id number] Yes TAKE ONE-HALF TABLET BY MOUTH BEFORE BREAKFAST Copland, Gay Filler, MD Taking Active   losartan (COZAAR) 100 MG tablet [id number] Yes Take 0.5 tablets (50 mg total) by mouth 2 (two) times daily. Copland, Gay Filler, MD Taking Active Self  metFORMIN (GLUCOPHAGE) 500 MG tablet [id number] Yes TAKE 2 TABLETS BY MOUTH EVERY MORNING THEN TAKE 1 TABLET BY MOUTH EVERY EVENING  Patient taking differently: Take 500 mg by mouth 2 (two) times daily with a meal.   Copland, Gay Filler, MD Taking Active            Med Note Antony Contras, Michah Minton B   Thu Apr 27, 2021  9:12 AM) Dose lowered due to renal function  spironolactone (ALDACTONE) 25 MG tablet  [id number] Yes Take 0.5 tablets (12.5 mg total) by mouth daily.  Patient taking differently: Take 12.5 mg by mouth at bedtime.   Copland, Jessica C, MD Taking Active   spironolactone (ALDACTONE) 25 MG tablet [id number] Yes Take 25 mg by mouth daily. 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   S/P CABG x 4    Severe aortic stenosis    Medicare annual wellness visit, subsequent 10/04/2014   Bruit 09/29/2014   Abdominal aortic aneurysm (HCC) 07/23/2014   BCC (basal cell carcinoma) 07/23/2014   SCC (squamous cell carcinoma), arm 07/23/2014   Essential hypertension, benign 07/08/2014   Coronary artery disease 07/08/2014   Hyperlipidemia LDL goal <100 07/08/2014   Type II diabetes mellitus, well controlled (HCC) 07/08/2014   Colon cancer screening 07/08/2014    Immunization History  Administered Date(s) Administered   Fluad Quad(high Dose 65+) 08/12/2019, 08/12/2020   Influenza, High Dose Seasonal PF 08/25/2015, 09/02/2017, 08/25/2018   Influenza,inj,Quad PF,6+ Mos 09/01/2014,  08/27/2016   Moderna SARS-COV2 Booster Vaccination 09/26/2020   Moderna Sars-Covid-2 Vaccination 12/23/2019, 01/20/2020   Pneumococcal Conjugate-13 10/04/2014   Pneumococcal Polysaccharide-23 10/10/2015   Tdap 01/05/2015   Zoster Recombinat (Shingrix) 05/27/2020, 08/26/2020    Conditions to be addressed/monitored: Atrial Fibrillation, CAD, HTN, HLD, DMII, and CKD Stage 3B  There are no care plans that you recently modified to display for this patient.   Medication Assistance: None required.  Patient affirms current coverage meets needs.  Patient's preferred pharmacy is:  [facility] [address] [place] [address] Phone: [phone] Fax: [phone]  [id number] - [place], [place] - [address] [place] [address] Phone: [phone] Fax: [phone]  [facility] - [place], [place] - [address] [place]  [address] Phone: [phone] Fax: [phone]  Uses pill box? Yes Pt endorses 95% compliance  Follow Up:  Patient agrees to Care Plan and Follow-up.  Plan: Telephone follow up appointment with care management team member scheduled for:  6 months;   Tammy Eckard, PharmD Clinical Pharmacist [facility] [place] [phone]      

## 2021-05-09 NOTE — Chronic Care Management (AMB) (Signed)
Chronic Care Management Pharmacy Note  05/09/2021 Name:  Peter Scott MRN:  [id number] DOB:  1932/04/15  Subjective: Peter Scott is an 87 y.o. year old male who is a primary patient of Copland, Gay Filler, MD.  The CCM team was consulted for assistance with disease management and care coordination needs.    Engaged with patient by telephone for follow up visit in response to provider referral for pharmacy case management and/or care coordination services.   Consent to Services:  The patient was given information about Chronic Care Management services, agreed to services, and gave verbal consent prior to initiation of services.  Please see initial visit note for detailed documentation.   Patient Care Team: Copland, Gay Filler, MD as PCP - General (Family Medicine) Hollar, Katharine Look, MD as Referring Physician (Dermatology) Cherre Robins, PharmD (Pharmacist)  Recent office visits: 09/26/2020 - PCP (Dr [name]) f/u visit. Recommended OTC loratadine for allergies.   Recent consult visits: 02/22/2021 - Cardio (Dr Mickel Fuchs) Afib follow up. No med changes.   Hospital visits: None in previous 6 months  Objective:  Lab Results  Component Value Date   CREATININE 1.58 (H) 08/24/2020   CREATININE 1.63 (H) 01/27/2020   CREATININE 1.44 10/01/2019    Lab Results  Component Value Date   HGBA1C 7.7 (H) 09/26/2020   Last diabetic Eye exam:  Lab Results  Component Value Date/Time   HMDIABEYEEXA No Retinopathy 05/20/2015 12:00 AM    Last diabetic Foot exam: No results found for: HMDIABFOOTEX      Component Value Date/Time   CHOL 133 07/29/2019 0827   TRIG 102 07/29/2019 0827   HDL 42 07/29/2019 0827   CHOLHDL 3.2 07/29/2019 0827   CHOLHDL 3 05/29/2019 0816   VLDL 17.6 05/29/2019 0816   LDLCALC 72 07/29/2019 0827    Hepatic Function Latest Ref Rng & Units 08/24/2020 01/27/2020 07/29/2019  Total Protein 6.0 - 8.5 g/dL 6.4 6.5 6.3  Albumin 3.6 - 4.6 g/dL 3.8 3.9 4.1  AST 0  - 40 IU/L $Remov'25 28 28  'omCeFK$ ALT 0 - 44 IU/L 41 39 45(H)  Alk Phosphatase 44 - 121 IU/L 75 73 64  Total Bilirubin 0.0 - 1.2 mg/dL 0.4 0.4 0.4  Bilirubin, Direct 0.00 - 0.40 mg/dL - - 0.13    Lab Results  Component Value Date/Time   TSH 3.010 08/24/2020 08:58 AM   TSH 2.230 01/27/2020 08:22 AM    CBC Latest Ref Rng & Units 08/24/2020 01/27/2020 07/29/2019  WBC 3.4 - 10.8 x10E3/uL 10.4 7.8 7.7  Hemoglobin 13.0 - 17.7 g/dL 12.2(L) 12.6(L) 13.2  Hematocrit 37.5 - 51.0 % 37.6 36.5(L) 40.2  Platelets 150 - 450 x10E3/uL 199 176 161    No results found for: VD25OH  Clinical ASCVD: Yes  The ASCVD Risk score Mikey Bussing DC Jr., et al., 2013) failed to calculate for the following reasons:   The 2013 ASCVD risk score is only valid for ages 90 to 68      Social History   Tobacco Use  Smoking Status Former   Packs/day: 1.00   Years: 3.00   Pack years: 3.00   Types: Cigarettes  Smokeless Tobacco Never  Tobacco Comments   quit tobbaco in his 20s   BP Readings from Last 3 Encounters:  02/22/21 (!) 144/62  09/26/20 (!) 142/80  08/24/20 (!) 162/60   Pulse Readings from Last 3 Encounters:  02/22/21 69  09/26/20 (!) 54  08/24/20 70   Wt Readings from Last 3 Encounters:  02/22/21 169 lb 12.8 oz (77 kg)  09/26/20 171 lb (77.6 kg)  08/24/20 171 lb 1.9 oz (77.6 kg)    Assessment: Review of patient past medical history, allergies, medications, health status, including review of consultants reports, laboratory and other test data, was performed as part of comprehensive evaluation and provision of chronic care management services.   SDOH:  (Social Determinants of Health) assessments and interventions performed:  SDOH Interventions    Flowsheet Row Most Recent Value  SDOH Interventions   Financial Strain Interventions Intervention Not Indicated  Physical Activity Interventions Intervention Not Indicated       CCM Care Plan  Allergies  Allergen Reactions   Glipizide Palpitations    "Heart  racing" "Heart racing"    Medications Reviewed Today     Reviewed by Cherre Robins, PharmD (Pharmacist) on 04/27/21 at [place] List Status: <None>   Medication Order Taking? Sig Documenting Provider Last Dose Status Informant  acetaminophen (TYLENOL) 500 MG tablet [id number] Yes Take 1 tablet (500 mg total) by mouth every 6 (six) hours as needed.  Patient taking differently: Take 500 mg by mouth every 6 (six) hours as needed for moderate pain or headache.   Nani Skillern, PA-C Taking Active   amiodarone (PACERONE) 200 MG tablet [id number] Yes Take 1 tablet (200 mg total) by mouth daily. Lelon Perla, MD Taking Active Self  amLODipine (NORVASC) 5 MG tablet [id number] Yes Take 1 tablet (5 mg total) by mouth daily. Eileen Stanford, PA-C Taking Active   amoxicillin (AMOXIL) 500 MG capsule [id number] Yes TAKE 4 CAPSULES BY MOUTH AS DIRECTED ONE HOUR PRIOR TO DENTAL VISITS Eileen Stanford, PA-C Taking Active   apixaban (ELIQUIS) 2.5 MG TABS tablet [id number] Yes Take 1 tablet (2.5 mg total) by mouth 2 (two) times daily. Lelon Perla, MD Taking Active   atorvastatin (LIPITOR) 40 MG tablet [id number] Yes Take 40 mg by mouth daily. [provider] Taking Active   Blood Glucose Monitoring Suppl (CONTOUR NEXT EZ) w/Device KIT [id number] Yes 1 kit by Does not apply route daily. Use once a day to check blood sugar.  Dx code: E11.9 Copland, Gay Filler, MD Taking Active   COVID-19 mRNA vaccine, Moderna, 100 MCG/0.5ML injection [id number]  INJECT AS DIRECTED Carlyle Basques, MD  Active   glucose blood (CONTOUR NEXT TEST) test strip [id number] Yes Use once a day to check blood sugar.  Dx code: E11.9 Copland, Gay Filler, MD Taking Active   glucose blood test strip [id number] Yes Check blood sugars once daily Copland, Gay Filler, MD Taking Active   glyBURIDE (DIABETA) 5 MG tablet [id number] Yes TAKE ONE-HALF TABLET BY MOUTH BEFORE BREAKFAST Copland, Gay Filler, MD Taking Active   losartan  (COZAAR) 100 MG tablet [id number] Yes Take 0.5 tablets (50 mg total) by mouth 2 (two) times daily. Copland, Gay Filler, MD Taking Active Self  metFORMIN (GLUCOPHAGE) 500 MG tablet [id number] Yes TAKE 2 TABLETS BY MOUTH EVERY MORNING THEN TAKE 1 TABLET BY MOUTH EVERY EVENING  Patient taking differently: Take 500 mg by mouth 2 (two) times daily with a meal.   Copland, Gay Filler, MD Taking Active            Med Note Antony Contras, Bleu Moisan B   Thu Apr 27, 2021  9:12 AM) Dose lowered due to renal function  spironolactone (ALDACTONE) 25 MG tablet [id number] Yes Take 0.5 tablets (12.5 mg total) by mouth daily.  Patient taking differently: Take 12.5 mg by mouth at bedtime.  Copland, Gay Filler, MD Taking Active   spironolactone (ALDACTONE) 25 MG tablet [id number] Yes Take 25 mg by mouth daily. 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   S/P CABG x 4    Severe aortic stenosis    Medicare annual wellness visit, subsequent 10/04/2014   Bruit 09/29/2014   Abdominal aortic aneurysm ([place]) 07/23/2014   BCC (basal cell carcinoma) 07/23/2014   SCC (squamous cell carcinoma), arm 07/23/2014   Essential hypertension, benign 07/08/2014   Coronary artery disease 07/08/2014   Hyperlipidemia LDL goal <100 07/08/2014   Type II diabetes mellitus, well controlled ([place]) 07/08/2014   Colon cancer screening 07/08/2014    Immunization History  Administered Date(s) Administered   Fluad Quad(high Dose 65+) 08/12/2019, 08/12/2020   Influenza, High Dose Seasonal PF 08/25/2015, 09/02/2017, 08/25/2018   Influenza,inj,Quad PF,6+ Mos 09/01/2014, 08/27/2016   Moderna SARS-COV2 Booster Vaccination 09/26/2020   Moderna Sars-Covid-2 Vaccination 12/23/2019, 01/20/2020   Pneumococcal Conjugate-13  10/04/2014   Pneumococcal Polysaccharide-23 10/10/2015   Tdap 01/05/2015   Zoster Recombinat (Shingrix) 05/27/2020, 08/26/2020    Conditions to be addressed/monitored: Atrial Fibrillation, CAD, DMII, and CKD Stage 3B  Care Plan : General Pharmacy (Adult)  Updates made by Cherre Robins, PHARMD since 05/09/2021 12:00 AM     Problem: Medication and Chronic Care managemetn   Priority: High  Onset Date: 04/27/2021     Long-Range Goal: Take medications as prescribed and attain goals of chronic disease states   Start Date: 04/27/2021  This Visit's Progress: On track  Priority: High  Note:   Current Barriers:  Unable to maintain control of HTN and Type 2 DM Confusion surrounding atorvastatin dose due to changes in strength supplied by pharmacy  Pharmacist Clinical Goal(s):  Over the next 180 days, patient will achieve adherence to monitoring guidelines and medication adherence to achieve therapeutic efficacy achieve control of type 2 DM as evidenced by A1c < 8.0 maintain control of HTN as evidenced by BP <140/90  Check strength of atorvastatin received from [place] and contact clinical pharmacist if unsure of correct does.    Interventions: 1:1 collaboration with Copland, Gay Filler, MD regarding development and update of comprehensive plan of care as evidenced by provider attestation and co-signature Inter-disciplinary care team collaboration (see longitudinal plan of care) Comprehensive medication review performed; medication list updated in electronic medical record  Hypertension Home BG readings have been at goal of <140/90 but in office BP slightly elevated Home BG: 140/60; 135/56; 136/55; 132/52 Patient denies dizziness, falls or lightheadedness  Current regimen:  Amlodipine 5 mg daily each morning before breakfast Losartan 100 mg - take 0.5 tablet twice daily Spironolactone 25 mg - take 0.5 tablet once a day Interventions: Discussed blood pressure goals  Discussed signs of  low blood pressure (like dizziness, lightheadedness); Report any symptoms of low blood pressure to provider Reviewed home blood pressure readings Recommended continue to check blood pressure 2 ot 3 times per week, document, and provide at future appointments  Hyperlipidemia/CAD: Last LDL was slightly above LDL goal < 70 Patient reports sometimes he receives $RemoveBef'40mg'RCuBQwenNd$  and sometimes $RemoveBefor'80mg'VExuGSTnLfev$  of atorvastatin from [place]. He states this confusing and he is not always sure how he is to take atorvastatin.  Current regimen:  Atorvastatin 40 mg daily Interventions: Discussed importance of diet and reducing consumption of cholesterol Reviewed exercise habits. Recommended continue to exercise regularly with goal of at least 150 minutes of moderate physical activity per week. Discussed to always check dose of atorvastatin received from pharmacy - if $Re'40mg'XtS$  take 1 tablet if $RemoveB'80mg'ixLVzfNO$  will need to cut tablet in half to get $Remo'40mg'Biytz$  dose; He is to call clinical pharmacist is any questions  Diabetes Lab Results  Component Value Date/Time   HGBA1C 7.7 (H) 09/26/2020 11:34 AM   HGBA1C 8.4 (H) 10/01/2019 09:48 AM  A1c at [facility] on 01/19/2021 was 8.4%  A1c not at goal of <8% Recent home BG readings Fasting: 125 ,131, 135 ,136 Post prandial: 160, 178, 162, 170, 162, 145 Current regimen:  Glyburide 5 mg - take 0.5 tablet each morning with breakfast   Metformin 500 mg - take 1 tablet with breakfast and 1 tablet with supper Previous medications tried:  glipizide - caused increase in HR Previously on higher metformin dose but decreased due to increase in eGFR Previously on glyburide $RemoveBefo'5mg'NfKOvNMzYLz$  0.5 tablet bid but had hypoglycemia during night and dose was lowered  Patient states he will see a diabetes educator 05/02/21 at [facility] and will be enrolled in a study there. He is not sure what this will entail.  Has appt 05/23/21 to see [place] physician and will have labs rechecked.  Interventions: Reviewed home blood glucose readings and reviewed goals   Fasting blood glucose goal (before meals) = 80 to 130 Blood glucose goal after a meal = less than 180  Recommended continue to check blood sugar once daily, document, and provide at future appointments; Continue to check blood sugar 2 hours after meal a few times per week to assess if post prandial blood sugar is above goal Contact provider with any episodes of hypoglycemia Future consideration for medication for diabetes if needed could include SGLT2 like [place] or Jardiance that might help with renal function.   Atrial Fibrillation:  Goal: maintain normal heart rhythm and prevent stroke H/o of cardioversion Sees Dr [name] regularly; Last TSH was WNL 08/24/2020 and Chest XRay checked 01/27/2020 was normal Current regimen:  Amiodarone $RemoveBef'200mg'ycIFWbGusf$  once a day Eliquis 2.$RemoveBefor'5mg'DZcvIFDSRedK$  - take 2 tablet twice a day Interventions:  Continue current regimen for atrial fibrillation Continue to monitor TSH, lung function and LFTs while on amiodarone therapy  Health Maintenance  Patient did have positive COVID test 03/2021.  Reports today no lingering symptoms Even when he was positive only symptoms were a mild cough.    Medication management Current pharmacy: [place] Interventions Comprehensive medication review performed. Continue current medication management strategy Patient has been 95% adherent to current medication regimen. No able to see all of refill history [facility].  Focus on medication adherence by filling and taking medications appropriately  Take medications as prescribed Report any questions or concerns to PharmD and/or provider(s)  Patient Goals/Self-Care Activities Over the next 180 days, patient will:  take medications as prescribed, check glucose daily, document, and provide at future appointments, and collaborate with provider on medication access solutions  Follow Up Plan: Telephone follow up  appointment with care management team member scheduled for:  3 to 6 months       Medication Assistance: None required.  Patient affirms current coverage meets needs.  Patient's preferred pharmacy is:  [facility] [address] [employer]  [place] [address] Phone: [phone] Fax: [id number] - [place], [place] - [address] AT [place] [address] [place] [address] Phone: [phone] Fax: [place], [place] - [place] [address] [place] [address] Phone: [phone] Fax: [phone]   Follow Up:  Patient agrees to Care Plan and Follow-up.  Plan: Telephone follow up appointment with care management team member scheduled for:  3 to 6 months  Cherre Robins, PharmD Clinical Pharmacist [facility]

## 2021-05-10 ENCOUNTER — Ambulatory Visit: Payer: Medicare Other | Admitting: Pharmacist

## 2021-05-10 DIAGNOSIS — N1832 Chronic kidney disease, stage 3b: Secondary | ICD-10-CM

## 2021-05-10 DIAGNOSIS — I2510 Atherosclerotic heart disease of native coronary artery without angina pectoris: Secondary | ICD-10-CM

## 2021-05-10 DIAGNOSIS — I2583 Coronary atherosclerosis due to lipid rich plaque: Secondary | ICD-10-CM

## 2021-05-10 DIAGNOSIS — E1122 Type 2 diabetes mellitus with diabetic chronic kidney disease: Secondary | ICD-10-CM

## 2021-05-10 NOTE — Patient Instructions (Signed)
Visit Information  PATIENT GOALS:  Goals Addressed             This Visit's Progress    Chronic Care Management Pharmacy Care Plan       CARE PLAN ENTRY (see longitudinal plan of care for additional care plan information)  Current Barriers:  Chronic Disease Management support, education, and care coordination needs related to Hypertension, Hyperlipidemia, Diabetes, Atrial Fibrillation and Coronary Artery Disease; Reduced kidney function   Hypertension BP Readings from Last 3 Encounters:  02/22/21 (!) 144/62  09/26/20 (!) 142/80  08/24/20 (!) 162/60  Pharmacist Clinical Goal(s): Over the next 90 days, patient will work with PharmD and providers to achieve BP goal <140/90 (home blood pressure readings have been at goal)  Current regimen:  Amlodipine 5 mg daily each morning before breakfast Losartan 100 mg - take 0.5 tablet twice daily Spironolactone 25 mg - take 0.5 tablet once a day Interventions: Discussed blood pressure goals  Discussed signs of low blood pressure (like dizziness, lightheadedness)  Reviewed home blood pressure readings Patient self care activities - Over the next 90 days, patient will: Continue to check blood pressure 2 ot 3 times per week, document, and provide at future appointments Report any symptoms of low blood pressure to provider Ensure daily salt intake < 2300 mg/day  Hyperlipidemia Lab Results  Component Value Date/Time   LDLCALC 72 07/29/2019 08:27 AM  Pharmacist Clinical Goal(s): Over the next 90 days, patient will work with PharmD and providers to achieve LDL goal < 70 Current regimen:  Atorvastatin 40 mg daily Interventions: Discussed importance of diet and reducing consumption of cholesterol Reviewed exercise habits Discussed to always check dose of atorvastatin received from pharmacy - if 40mg  take 1 tablet if 80mg  will need to cut tablet in half to get 40mg  dose Patient self care activities - Over the next 90 days, patient  will: Reduce consumption of cholesterol Continue to exercise regularly with goal of at least 150 minutes of moderate physical activity per week. Check atorvastatin strength each time you receive a new prescription from [place].   Diabetes Lab Results  Component Value Date/Time   HGBA1C 7.7 (H) 09/26/2020 11:34 AM   HGBA1C 8.4 (H) 10/01/2019 09:48 AM  A1c at [facility] on 01/19/2021 was 8.4%  Pharmacist Clinical Goal(s): Over the next 90 days, patient will work with PharmD and providers to achieve A1c goal <8% Current regimen:  Jardiance 10mg  daily  Metformin 500 mg - take 1 tablet with breakfast and 1 tablet with supper Interventions: Reviewed home blood glucose readings and reviewed goals  Fasting blood glucose goal (before meals) = 80 to 130 Blood glucose goal after a meal = less than 180  Patient self care activities - Over the next 90 days, patient will: Check blood sugar once daily, document, and provide at future appointments; Continue to check blood sugar 2 hours after meal a few times per week to assess if post prandial blood sugar is above goal Contact provider with any episodes of hypoglycemia  Atrial Fibrillation:  Pharmacist Clinical Goal(s): Over the next 90 days, patient will work with PharmD and providers to maintain normal heart rhythm and prevent stroke Current regimen:  Amiodarone 200mg  once a day Eliquis 2.5mg  - take 2 tablet twice a day Patient self care activities - Over the next 90 days, patient will: Continue current regimen for atrial fibrillation  Health Maintenance  Pharmacist Clinical Goal(s) Over the next 90 days, patient will work with PharmD and providers to complete  health maintenance screenings/vaccinations Interventions: Consider completing Shingrix vaccine series (completed both vaccines) Patient self care activities - Over the next 90 days, patient will: Complete Shingrix vaccine series (completed on 08/26/20)  Medication management Pharmacist Clinical  Goal(s): Over the next 90 days, patient will work with PharmD and providers to maintain optimal medication adherence Current pharmacy: [facility] Comprehensive medication review performed. Continue current medication management strategy Patient self care activities - Over the next 90 days, patient will: Focus on medication adherence by filling and taking medications appropriately  Take medications as prescribed Report any questions or concerns to PharmD and/or provider(s)  Please see past updates related to this goal by clicking on the "Past Updates" button in the selected goal           The patient verbalized understanding of instructions, educational materials, and care plan provided today and declined offer to receive copy of patient instructions, educational materials, and care plan.   Telephone follow up appointment with care management team member scheduled for: 3 months  Cherre Robins, PharmD Clinical Pharmacist [place] [phone]

## 2021-05-10 NOTE — Chronic Care Management (AMB) (Signed)
Chronic Care Management Pharmacy Note  05/10/2021 Name:  Peter Scott MRN:  [id number] DOB:  11/06/1932  Summary: Patient had initial visit with Endo - Dr Hartford Poli at [facility] on 05/02/2021 - [facility] Poli stopped glyburide and started Jardiance 10mg  daily  Recommendations/Changes made from today's visit: Updated med list and forward to PCP at patient request.  Discussed monitoring BG and goals; also recommended he contact provider if he has dizziness or s/s of infection.    Subjective: Peter Scott is an 86 y.o. year old male who is a primary patient of Peter Scott, Peter Scott, Peter Scott.  The CCM team was consulted for assistance with disease management and care coordination needs.    Engaged with patient by telephone for  medication review and update med list  in response to provider referral for pharmacy case management and/or care coordination services.   Consent to Services:  The patient was given information about Chronic Care Management services, agreed to services, and gave verbal consent prior to initiation of services.  Please see initial visit note for detailed documentation.   Patient Care Team: Peter Scott, Peter Scott, Peter Scott as PCP - General (Family Medicine) Hollar, Katharine Look, Peter Scott as Referring Physician (Dermatology) Cherre Robins, PharmD (Pharmacist)  Recent office visits: Note in last 6 months  Recent consult visits: 05/02/2021 - Endo (Dr Hilbert Corrigan [facility] [place]) initial visit; stopped glyburide due to risk of hypoglycemia with CKD; started Jardiance 10mg  daily for CKD, DM and heart benefits. Continued metformin 500mg  twice a day.  Hospital visits: None in previous 6 months  Objective:  Lab Results  Component Value Date   CREATININE 1.58 (H) 08/24/2020   CREATININE 1.63 (H) 01/27/2020   CREATININE 1.44 10/01/2019    Lab Results  Component Value Date   HGBA1C 7.7 (H) 09/26/2020   Last diabetic Eye exam:  Lab Results  Component Value Date/Time   HMDIABEYEEXA No  Retinopathy 05/20/2015 12:00 AM    Last diabetic Foot exam: No results found for: HMDIABFOOTEX      Component Value Date/Time   CHOL 133 07/29/2019 0827   TRIG 102 07/29/2019 0827   HDL 42 07/29/2019 0827   CHOLHDL 3.2 07/29/2019 0827   CHOLHDL 3 05/29/2019 0816   VLDL 17.6 05/29/2019 0816   LDLCALC 72 07/29/2019 0827    Hepatic Function Latest Ref Rng & Units 08/24/2020 01/27/2020 07/29/2019  Total Protein 6.0 - 8.5 g/dL 6.4 6.5 6.3  Albumin 3.6 - 4.6 g/dL 3.8 3.9 4.1  AST 0 - 40 IU/L 25 28 28   ALT 0 - 44 IU/L 41 39 45(H)  Alk Phosphatase 44 - 121 IU/L 75 73 64  Total Bilirubin 0.0 - 1.2 mg/dL 0.4 0.4 0.4  Bilirubin, Direct 0.00 - 0.40 mg/dL - - 0.13    Lab Results  Component Value Date/Time   TSH 3.010 08/24/2020 08:58 AM   TSH 2.230 01/27/2020 08:22 AM    CBC Latest Ref Rng & Units 08/24/2020 01/27/2020 07/29/2019  WBC 3.4 - 10.8 x10E3/uL 10.4 7.8 7.7  Hemoglobin 13.0 - 17.7 g/dL 12.2(L) 12.6(L) 13.2  Hematocrit 37.5 - 51.0 % 37.6 36.5(L) 40.2  Platelets 150 - 450 x10E3/uL 199 176 161    No results found for: VD25OH  Clinical ASCVD: Yes  The ASCVD Risk score Peter Bussing DC Jr., Peter al., 2013) failed to calculate for the following reasons:   The 2013 ASCVD risk score is only valid for ages 51 to 42     Social History  Tobacco Use  Smoking Status Former   Packs/day: 1.00   Years: 3.00   Pack years: 3.00   Types: Cigarettes  Smokeless Tobacco Never  Tobacco Comments   quit tobbaco in his 68s   BP Readings from Last 3 Encounters:  02/22/21 (!) 144/62  09/26/20 (!) 142/80  08/24/20 (!) 162/60   Pulse Readings from Last 3 Encounters:  02/22/21 69  09/26/20 (!) 54  08/24/20 70   Wt Readings from Last 3 Encounters:  02/22/21 169 lb 12.8 oz (77 kg)  09/26/20 171 lb (77.6 kg)  08/24/20 171 lb 1.9 oz (77.6 kg)    Assessment: Review of patient past medical history, allergies, medications, health status, including review of consultants reports, laboratory and other  test data, was performed as part of comprehensive evaluation and provision of chronic care management services.   SDOH:  (Social Determinants of Health) assessments and interventions performed:    CCM Care Plan  Allergies  Allergen Reactions   Glipizide Palpitations    "Heart racing" "Heart racing"    Medications Reviewed Today     Reviewed by Cherre Robins, PharmD (Pharmacist) on 04/27/21 at [facility] List Status: <None>   Medication Order Taking? Sig Documenting Provider Last Dose Status Informant  acetaminophen (TYLENOL) 500 MG tablet [id number] Yes Take 1 tablet (500 mg total) by mouth every 6 (six) hours as needed.  Patient taking differently: Take 500 mg by mouth every 6 (six) hours as needed for moderate pain or headache.   Nani Skillern, PA-C Taking Active   amiodarone (PACERONE) 200 MG tablet [id number] Yes Take 1 tablet (200 mg total) by mouth daily. Lelon Perla, Peter Scott Taking Active Self  amLODipine (NORVASC) 5 MG tablet [id number] Yes Take 1 tablet (5 mg total) by mouth daily. Eileen Stanford, PA-C Taking Active   amoxicillin (AMOXIL) 500 MG capsule [id number] Yes TAKE 4 CAPSULES BY MOUTH AS DIRECTED ONE HOUR PRIOR TO DENTAL VISITS Eileen Stanford, PA-C Taking Active   apixaban (ELIQUIS) 2.5 MG TABS tablet [id number] Yes Take 1 tablet (2.5 mg total) by mouth 2 (two) times daily. Lelon Perla, Peter Scott Taking Active   atorvastatin (LIPITOR) 40 MG tablet [id number] Yes Take 40 mg by mouth daily. Provider, Historical, Peter Scott Taking Active   Blood Glucose Monitoring Suppl (CONTOUR NEXT EZ) w/Device KIT [id number] Yes 1 kit by Does not apply route daily. Use once a day to check blood sugar.  Dx code: E11.9 Peter Scott, Peter Scott, Peter Scott Taking Active   COVID-19 mRNA vaccine, Moderna, 100 MCG/0.5ML injection [id number]  INJECT AS DIRECTED Carlyle Basques, Peter Scott  Active   glucose blood (CONTOUR NEXT TEST) test strip [id number] Yes Use once a day to check blood sugar.  Dx code: E11.9  Peter Scott, Peter Scott, Peter Scott Taking Active   glucose blood test strip [id number] Yes Check blood sugars once daily Peter Scott, Peter Scott, Peter Scott Taking Active   glyBURIDE (DIABETA) 5 MG tablet [id number] Yes TAKE ONE-HALF TABLET BY MOUTH BEFORE BREAKFAST Peter Scott, Peter Scott, Peter Scott Taking Active   losartan (COZAAR) 100 MG tablet [id number] Yes Take 0.5 tablets (50 mg total) by mouth 2 (two) times daily. Peter Scott, Peter Scott, Peter Scott Taking Active Self  metFORMIN (GLUCOPHAGE) 500 MG tablet [id number] Yes TAKE 2 TABLETS BY MOUTH EVERY MORNING THEN TAKE 1 TABLET BY MOUTH EVERY EVENING  Patient taking differently: Take 500 mg by mouth 2 (two) times daily with a meal.   Peter Scott, Peter Scott, Peter Scott Taking Active  Med Note Antony Contras, Robertlee Rogacki B   Thu Apr 27, 2021  9:12 AM) Dose lowered due to renal function  spironolactone (ALDACTONE) 25 MG tablet [id number] Yes Take 0.5 tablets (12.5 mg total) by mouth daily.  Patient taking differently: Take 12.5 mg by mouth at bedtime.   Peter Scott, Peter Scott, Peter Scott Taking Active   spironolactone (ALDACTONE) 25 MG tablet [id number] Yes Take 25 mg by mouth daily. 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   S/P CABG x 4    Severe aortic stenosis    Medicare annual wellness visit, subsequent 10/04/2014   Bruit 09/29/2014   Abdominal aortic aneurysm ([place]) 07/23/2014   BCC (basal cell carcinoma) 07/23/2014   SCC (squamous cell carcinoma), arm 07/23/2014   Essential hypertension, benign 07/08/2014   Coronary artery disease 07/08/2014   Hyperlipidemia LDL goal <100 07/08/2014   Type II diabetes mellitus, well controlled ([place]) 07/08/2014   Colon cancer screening 07/08/2014    Immunization History  Administered Date(s) Administered   Fluad Quad(high Dose 65+)  08/12/2019, 08/12/2020   Influenza, High Dose Seasonal PF 08/25/2015, 09/02/2017, 08/25/2018   Influenza,inj,Quad PF,6+ Mos 09/01/2014, 08/27/2016   Moderna SARS-COV2 Booster Vaccination 09/26/2020   Moderna Sars-Covid-2 Vaccination 12/23/2019, 01/20/2020   Pneumococcal Conjugate-13 10/04/2014   Pneumococcal Polysaccharide-23 10/10/2015   Tdap 01/05/2015   Zoster Recombinat (Shingrix) 05/27/2020, 08/26/2020    Conditions to be addressed/monitored: Atrial Fibrillation, CAD, HTN, HLD, DMII, and CKD Stage 3B   Care Plan : General Pharmacy (Adult)  Updates made by Cherre Robins, PHARMD since 05/10/2021 12:00 AM     Problem: Medication and Chronic Care managemetn   Priority: High  Onset Date: 04/27/2021     Long-Range Goal: Take medications as prescribed and attain goals of chronic disease states   Start Date: 04/27/2021  Recent Progress: On track  Priority: High  Note:   Current Barriers:  Unable to maintain control of HTN and Type 2 DM Confusion surrounding atorvastatin dose due to changes in strength supplied by pharmacy  Pharmacist Clinical Goal(s):  Over the next 180 days, patient will achieve adherence to monitoring guidelines and medication adherence to achieve therapeutic efficacy achieve control of type 2 DM as evidenced by A1c < 8.0 maintain control of HTN as evidenced by BP <140/90  Check strength of atorvastatin received from [place] and contact clinical pharmacist if unsure of correct does.    Interventions: 1:1 collaboration with Peter Scott, Peter Scott, Peter Scott regarding development and update of comprehensive plan of care as evidenced by provider attestation and co-signature Inter-disciplinary care team collaboration (see longitudinal plan of care) Comprehensive medication review performed; medication list updated in electronic medical record  Hypertension Home BG readings have been at goal of <140/90 but in office BP slightly elevated Home BG: 140/60; 135/56; 136/55;  132/52 Patient denies dizziness, falls or lightheadedness  Current regimen:  Amlodipine 5 mg daily each morning before breakfast Losartan 100 mg - take 0.5 tablet twice daily Spironolactone 25 mg - take 0.5 tablet once a day Interventions: Discussed blood pressure goals  Discussed signs of low blood pressure (like dizziness, lightheadedness); Report any symptoms of low blood pressure to provider Reviewed home blood pressure readings Recommended continue to check blood pressure 2 ot 3  times per week, document, and provide at future appointments  Hyperlipidemia/CAD: Last LDL was slightly above LDL goal < 70 Patient reports sometimes he receives $RemoveBef'40mg'dWvnqBXuKJ$  and sometimes $RemoveBefor'80mg'TtUgImQkpmBR$  of atorvastatin from [place]. He states this confusing and he is not always sure how he is to take atorvastatin.  Current regimen:  Atorvastatin 40 mg daily Interventions: Discussed importance of diet and reducing consumption of cholesterol Reviewed exercise habits. Recommended continue to exercise regularly with goal of at least 150 minutes of moderate physical activity per week. Discussed to always check dose of atorvastatin received from pharmacy - if $Re'40mg'AJv$  take 1 tablet if $RemoveB'80mg'chmiVyPv$  will need to cut tablet in half to get $Remo'40mg'xhEdG$  dose; He is to call clinical pharmacist is any questions  Diabetes Lab Results  Component Value Date/Time   HGBA1C 7.7 (H) 09/26/2020 11:34 AM   HGBA1C 8.4 (H) 10/01/2019 09:48 AM  [facility]c at [facility] on 01/19/2021 was 8.4% A1c at Endo office 05/02/2021 was 7.8%  A1c at goal of <8% but due to decreased renal function concern for hypoglycemia with use of sulfonylureas Recent home BG readings Fasting: low 100's Post prandial: 180 to 250's Intial visit with Dr Hilbert Corrigan - Endo with [facility] on 05/02/2021. Stopped glyburide due to hypoglycemia risk and started Jardiance $RemoveBeforeDEI'10mg'BwtxtzVlnrizlYtW$  daily for DM, renal protection and heart benefits Current regimen:  Jardiance $RemoveBe'10mg'SfwoqRopY$  daily (took first dose today)  Metformin 500 mg - take 1 tablet with  breakfast and 1 tablet with supper Previous medications tried:  glipizide - caused increase in HR Glyburide - hypoglycemia risk due to increased Scr Previously on higher metformin dose but decreased due to increase in eGFR Had dietary counseling at [facility] in June 2022.  Reports has been eating fewer carbohydrates over the last 2 weks. Interventions: Reviewed home blood glucose readings and reviewed goals  Fasting blood glucose goal (before meals) = 80 to 130 Blood glucose goal after a meal = less than 180  Recommended continue to check blood sugar once daily, document, and provide at future appointments; Continue to check blood sugar 2 hours after meal a few times per week to assess if post prandial blood sugar is above goal Contact provider with any episodes of hypoglycemia Future consideration for medication for diabetes if needed could include SGLT2 like [place] or Jardiance that might help with renal function. (Jardiance started by Dr Hilbert Corrigan 05/02/2021)  Atrial Fibrillation:  Goal: maintain normal heart rhythm and prevent stroke H/o of cardioversion Sees Dr [name] regularly; Last TSH was WNL 08/24/2020 and Chest XRay checked 01/27/2020 was normal Current regimen:  Amiodarone $RemoveBef'200mg'tNNzHIloxe$  once a day Eliquis 2.$RemoveBefor'5mg'gNSINHCUJCrQ$  - take 2 tablet twice a day Interventions:  Continue current regimen for atrial fibrillation Continue to monitor TSH, lung function and LFTs while on amiodarone therapy  Health Maintenance  Patient did have positive COVID test 03/2021.  Reports today no lingering symptoms Even when he was positive only symptoms were a mild cough.    Medication management Current pharmacy: [facility] Comprehensive medication review performed. Continue current medication management strategy Patient has been 95% adherent to current medication regimen. No able to see all of refill history [facility]   Focus on medication adherence by filling and taking medications appropriately  Take medications as prescribed Report any questions or concerns to PharmD and/or provider(s)  Patient Goals/Self-Care Activities Over the next 180 days, patient will:  take medications as prescribed, check glucose daily, document, and provide at  future appointments, and collaborate with provider on medication access solutions  Follow Up Plan: Telephone follow up appointment with care management team member scheduled for:  3 months       Medication Assistance: None required.  Patient affirms current coverage meets needs.  Patient's preferred pharmacy is:  [facility] [address] [place] [address] Phone: [phone] Fax: [id number] - [place], [place] - [address] AT [place] [address] [place] [address] Phone: [phone] Fax: [place], [place] - [address] [place] [address] Phone: [phone] Fax: [phone]   Follow Up:  Patient agrees to Care Plan and Follow-up.  Plan: Telephone follow up appointment with care management team member scheduled for:  September 2022  Cherre Robins, PharmD Clinical Pharmacist [place]  [phone]

## 2021-05-23 ENCOUNTER — Encounter: Payer: Self-pay | Admitting: Family Medicine

## 2021-05-23 LAB — BASIC METABOLIC PANEL
BUN: 57 — AB (ref 4–21)
CO2: 25 — AB (ref 13–22)
Chloride: 100 (ref 99–108)
Creatinine: 2.1 — AB (ref <=1.3)
Glucose: 246
Potassium: 4.8 (ref 3.4–5.3)
Sodium: 134 — AB (ref 137–147)

## 2021-05-23 LAB — LIPID PANEL
Cholesterol: 165 (ref 0–200)
HDL: 46 (ref 35–70)
LDL Cholesterol: 88
Triglycerides: 157 (ref 40–160)

## 2021-05-23 LAB — COMPREHENSIVE METABOLIC PANEL: Calcium: 9.9 (ref 8.7–10.7)

## 2021-05-23 LAB — VITAMIN D 25 HYDROXY (VIT D DEFICIENCY, FRACTURES): Vit D, 25-Hydroxy: 22

## 2021-05-23 LAB — HEPATIC FUNCTION PANEL
Alkaline Phosphatase: 71 (ref 25–125)
Bilirubin, Total: 0.4

## 2021-05-23 LAB — HEMOGLOBIN A1C: Hemoglobin A1C: 8.2

## 2021-05-23 LAB — CBC AND DIFFERENTIAL
HCT: 39 — AB (ref 41–53)
Hemoglobin: 13.3 — AB (ref 13.5–17.5)
Platelets: 196 (ref 150–399)

## 2021-05-29 DIAGNOSIS — K1321 Leukoplakia of oral mucosa, including tongue: Secondary | ICD-10-CM | POA: Diagnosis not present

## 2021-05-29 DIAGNOSIS — L570 Actinic keratosis: Secondary | ICD-10-CM | POA: Diagnosis not present

## 2021-05-29 DIAGNOSIS — Z85828 Personal history of other malignant neoplasm of skin: Secondary | ICD-10-CM | POA: Diagnosis not present

## 2021-05-31 NOTE — Addendum Note (Signed)
Addended by: Lamar Blinks C on: 05/31/2021 01:08 PM   Modules accepted: Orders

## 2021-06-01 NOTE — Progress Notes (Addendum)
[facility] [address] [place], [place] [address] [phone]  Date:  06/02/2021   Name:  Peter Scott   DOB:  [birth year]   MRN:  [id number]  PCP:  Pearline Cables, MD    Chief Complaint: Medical Management of Chronic Issues (F/u)   History of Present Illness:  Peter Scott is a 87 y.o. very pleasant male patient who presents with the following:  Addendum 7/20-attention Rozanna Boer, NP- PCP for this patient Dear Mr. Leonor Liv, I am not able to provide a prescription for this patient through the [place] pharmacy.  Based on his poor kidney function, I feel that a DPP-4 inhibitor is the best option for his diabetes control.  The [facility] formulary prefers Alogliptin in particular.  I will leave this up to your discretion as again I am not able to provide a prescription  Thank you- J Victorino Fatzinger MD   Pt seen today to discuss diabetes  Last seen by myself in November  He tried using Jardiance, this was rx for him on 6/22 by Dr Shawnee Knapp who is an endocrinologist with [facility] - however, it seemed to make him feel poorly- he was very thirsty and notes he was losing weight, his blood sugars seem to be going up. He stopped taking it about 2 weeks later and felt better  However, he then had some follow-up [facility]-his GFR was noted to be 30, so we had him stop using metformin He then started taking glyburide but I advised that this is also not a good idea with his renal function.  We will need to find a new medication option  [facility] Per most recent cardiology notes and echo he does not have CHF Echo 4/21  1. Left ventricular ejection fraction, by estimation, is 55 to 60%. The  left ventricle has normal function. The left ventricle has no regional  wall motion abnormalities.  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   S/P CABG x 4    Severe aortic stenosis    Medicare annual wellness visit, subsequent 10/04/2014   Bruit 09/29/2014   Abdominal aortic aneurysm (HCC) 07/23/2014   BCC (basal cell carcinoma) 07/23/2014   SCC (squamous cell carcinoma), arm 07/23/2014   Essential hypertension, benign 07/08/2014   Coronary artery disease 07/08/2014   Hyperlipidemia LDL goal <100 07/08/2014   Type II diabetes mellitus, well controlled (HCC) 07/08/2014   Colon cancer screening 07/08/2014    Past Medical History:  Diagnosis Date   Aortic aneurysm (HCC)    Aortic stenosis    CAD (coronary artery disease)    Diabetes mellitus type II, controlled (HCC)    Heart disease    Ablation   History of chicken pox    Hyperlipidemia    Hypertension    S/P CABG x 4    2001 - [place]   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   26 mm Edwards Sapien 3 transcatheter heart valve placed via percutaneous right transfemoral approach  Umbilical hernia     Past Surgical History:  Procedure Laterality Date   ABLATION     Rhythm problem; rhythm unknown; [place]   CARDIOVERSION N/A 07/14/2018   Procedure: CARDIOVERSION;  Surgeon: Dorothy Spark, MD;  Location: [facility] ENDOSCOPY;  Service: Cardiovascular;  Laterality: N/A;   CARDIOVERSION N/A 08/27/2018   Procedure: CARDIOVERSION;  Surgeon: Jerline Pain, MD;  Location: [place] ENDOSCOPY;  Service: Cardiovascular;  Laterality: N/A;   CORONARY ARTERY BYPASS GRAFT  2001   EXPLORATION POST OPERATIVE OPEN HEART  2001, June   INTRAOPERATIVE TRANSTHORACIC ECHOCARDIOGRAM N/A 04/01/2018   Procedure: INTRAOPERATIVE TRANSTHORACIC ECHOCARDIOGRAM;  Surgeon: Burnell Blanks, MD;   Location: [place];  Service: Open Heart Surgery;  Laterality: N/A;   RIGHT/LEFT HEART CATH AND CORONARY/GRAFT ANGIOGRAPHY N/A 01/31/2018   Procedure: RIGHT/LEFT HEART CATH AND CORONARY/GRAFT ANGIOGRAPHY;  Surgeon: Burnell Blanks, MD;  Location: [place] CV LAB;  Service: Cardiovascular;  Laterality: N/A;   TEE WITHOUT CARDIOVERSION N/A 01/17/2018   Procedure: TRANSESOPHAGEAL ECHOCARDIOGRAM (TEE);  Surgeon: Lelon Perla, MD;  Location: [facility] ENDOSCOPY;  Service: Cardiovascular;  Laterality: N/A;   TEE WITHOUT CARDIOVERSION N/A 07/14/2018   Procedure: TRANSESOPHAGEAL ECHOCARDIOGRAM (TEE);  Surgeon: Dorothy Spark, MD;  Location: [place];  Service: Cardiovascular;  Laterality: N/A;   TONSILLECTOMY     TRANSCATHETER AORTIC VALVE REPLACEMENT, TRANSFEMORAL N/A 04/01/2018   Procedure: TRANSCATHETER AORTIC VALVE REPLACEMENT, TRANSFEMORAL;  Surgeon: Burnell Blanks, MD;  Location: [place];  Service: Open Heart Surgery;  Laterality: N/A;   UMBILICAL HERNIA REPAIR  2015    Social History   Tobacco Use   Smoking status: Former    Packs/day: 1.00    Years: 3.00    Pack years: 3.00    Types: Cigarettes   Smokeless tobacco: Never   Tobacco comments:    quit tobbaco in his 13s  Vaping Use   Vaping Use: Never used  Substance Use Topics   Alcohol use: No    Alcohol/week: 0.0 standard drinks   Drug use: No    Family History  Problem Relation Age of Onset   Diabetes Mother 46       Deceased   Heart disease Father 74       Deceased   Heart disease Maternal Uncle    Diabetes Maternal Uncle    Heart disease Brother    Diabetes Brother    Diabetes Sister        #1   Heart disease Sister        #2   Hyperlipidemia Son        x2    Allergies  Allergen Reactions   Glipizide Palpitations    "Heart racing" "Heart racing"    Medication list has been reviewed and updated.  Current Outpatient Medications on File Prior to Visit  Medication Sig Dispense Refill    acetaminophen (TYLENOL) 500 MG tablet Take 1 tablet (500 mg total) by mouth every 6 (six) hours as needed. (Patient taking differently: Take 500 mg by mouth every 6 (six) hours as needed for moderate pain or headache.) 30 tablet 0   amiodarone (PACERONE) 200 MG tablet Take 1 tablet (200 mg total) by mouth daily. 90 tablet 3   amLODipine (NORVASC) 5 MG tablet Take 1 tablet (5 mg total) by mouth daily.     amoxicillin (AMOXIL) 500 MG capsule TAKE 4 CAPSULES BY MOUTH AS DIRECTED ONE HOUR PRIOR TO DENTAL VISITS 8 capsule 6   apixaban (ELIQUIS) 2.5 MG TABS  tablet Take 1 tablet (2.5 mg total) by mouth 2 (two) times daily.     atorvastatin (LIPITOR) 40 MG tablet Take 40 mg by mouth daily.     Blood Glucose Monitoring Suppl (CONTOUR NEXT EZ) w/Device KIT 1 kit by Does not apply route daily. Use once a day to check blood sugar.  Dx code: E11.9 1 kit 0   glucose blood (CONTOUR NEXT TEST) test strip Use once a day to check blood sugar.  Dx code: E11.9 100 each 12   glucose blood test strip Check blood sugars once daily 100 each 12   losartan (COZAAR) 100 MG tablet Take 0.5 tablets (50 mg total) by mouth 2 (two) times daily. 90 tablet 1   spironolactone (ALDACTONE) 25 MG tablet Take 25 mg by mouth daily. Patient takes 1 tablet in the morning and 1/2 tablet in the evening     No current facility-administered medications on file prior to visit.    Review of Systems:  As per HPI- otherwise negative.   Physical Examination: Vitals:   06/02/21 1139  BP: (!) 148/70  Pulse: (!) 55  Temp: 98.1 F (36.7 C)  SpO2: 97%   Vitals:   06/02/21 1139  Weight: 169 lb 9.6 oz (76.9 kg)  Height: $Remove'5\' 9"'aFAGcdY$  (1.753 m)   Body mass index is 25.05 kg/m. Ideal Body Weight: Weight in (lb) to have BMI = 25: 168.9  GEN: no acute distress.  Elderly gentleman who looks well Recent skin cancer treatment to his lower back per dermatology HEENT: Atraumatic, Normocephalic.  Ears and Nose: No external deformity. CV: RRR, No  M/G/R. No JVD. No thrill. No extra heart sounds. PULM: CTA B, no wheezes, crackles, rhonchi. No retractions. No resp. distress. No accessory muscle use. ABD: S, NT, ND, +BS. No rebound. No HSM. EXTR: No c/c/e. PSYCH: Normally interactive. Conversant.   GFR 30 on most recent labs -will be abstracted Assessment and Plan: Type 2 diabetes mellitus with stage 3b chronic kidney disease, without long-term current use of insulin ([place]) - Plan: sitaGLIPtin (JANUVIA) 25 MG tablet, sitaGLIPtin (JANUVIA) 25 MG tablet  Patient seen today for follow-up of diabetes.  He has had 2 recent A1c readings, one 7.8 and and 8.3%.  We will average these at about 8%. Due to his renal function, his medication options are limited.  He also could not tolerate Jardiance We will start him on Januvia dose adjusted for renal function.  He is asked to please update in about a week me regarding how he does with this medication and any side effects.  He agrees to do so  This visit occurred during the SARS-CoV-2 public health emergency.  Safety protocols were in place, including screening questions prior to the visit, additional usage of staff PPE, and extensive cleaning of exam room while observing appropriate contact time as indicated for disinfecting solutions.   Signed Lamar Blinks, MD

## 2021-06-02 ENCOUNTER — Ambulatory Visit (INDEPENDENT_AMBULATORY_CARE_PROVIDER_SITE_OTHER): Payer: Medicare Other | Admitting: Family Medicine

## 2021-06-02 ENCOUNTER — Encounter: Payer: Self-pay | Admitting: Family Medicine

## 2021-06-02 ENCOUNTER — Other Ambulatory Visit: Payer: Self-pay

## 2021-06-02 ENCOUNTER — Other Ambulatory Visit (HOSPITAL_BASED_OUTPATIENT_CLINIC_OR_DEPARTMENT_OTHER): Payer: Self-pay

## 2021-06-02 VITALS — BP 148/70 | HR 55 | Temp 98.1°F | Ht 69.0 in | Wt 169.6 lb

## 2021-06-02 DIAGNOSIS — E1122 Type 2 diabetes mellitus with diabetic chronic kidney disease: Secondary | ICD-10-CM | POA: Diagnosis not present

## 2021-06-02 DIAGNOSIS — I2510 Atherosclerotic heart disease of native coronary artery without angina pectoris: Secondary | ICD-10-CM | POA: Diagnosis not present

## 2021-06-02 DIAGNOSIS — I2583 Coronary atherosclerosis due to lipid rich plaque: Secondary | ICD-10-CM | POA: Diagnosis not present

## 2021-06-02 DIAGNOSIS — N1832 Chronic kidney disease, stage 3b: Secondary | ICD-10-CM | POA: Diagnosis not present

## 2021-06-02 MED ORDER — SITAGLIPTIN PHOSPHATE 25 MG PO TABS
25.0000 mg | ORAL_TABLET | Freq: Every day | ORAL | 0 refills | Status: DC
  Filled 2021-06-02 (×2): qty 30, 30d supply, fill #0

## 2021-06-02 MED ORDER — SITAGLIPTIN PHOSPHATE 25 MG PO TABS: 25.0000 mg | ORAL_TABLET | Freq: Every day | ORAL | 6 refills | Status: DC

## 2021-06-02 NOTE — Patient Instructions (Signed)
It was good to see you today!   For your diabetes we are going to try Januvia 25 mg once daily We can plan to check an A1c in about 4 months Let me know how you respond to the Januvia - any side effects, etc Don't forget your eye exam!

## 2021-06-05 ENCOUNTER — Encounter: Payer: Self-pay | Admitting: Family Medicine

## 2021-06-06 ENCOUNTER — Other Ambulatory Visit (HOSPITAL_BASED_OUTPATIENT_CLINIC_OR_DEPARTMENT_OTHER): Payer: Self-pay

## 2021-06-06 ENCOUNTER — Telehealth: Payer: Self-pay | Admitting: Family Medicine

## 2021-06-06 MED ORDER — LINAGLIPTIN 5 MG PO TABS
5.0000 mg | ORAL_TABLET | Freq: Every day | ORAL | 1 refills | Status: DC
  Filled 2021-06-06: qty 30, 30d supply, fill #0

## 2021-06-06 NOTE — Telephone Encounter (Signed)
CallerDenyse Amass ([place]) Call back # [phone] ext [phone]  Checking the status of a prior authorization form for Januvia.  Januvia is not a preferred medication. .   Preferred medication are: Jardiance & Alogliptin

## 2021-06-06 NOTE — Addendum Note (Signed)
Addended by: Lamar Blinks C on: 06/06/2021 03:51 PM   Modules accepted: Orders

## 2021-06-07 ENCOUNTER — Encounter: Payer: Self-pay | Admitting: Family Medicine

## 2021-06-07 ENCOUNTER — Other Ambulatory Visit (HOSPITAL_BASED_OUTPATIENT_CLINIC_OR_DEPARTMENT_OTHER): Payer: Self-pay

## 2021-06-07 MED ORDER — ALOGLIPTIN BENZOATE 6.25 MG PO TABS: 6.2500 mg | ORAL_TABLET | Freq: Every day | ORAL | 3 refills | Status: DC

## 2021-06-07 NOTE — Telephone Encounter (Signed)
Peter Scott ([place] Pharmacist) Call back Number: [phone]  ext [phone]  Would like to speak to you in regards to medication.

## 2021-06-08 NOTE — Telephone Encounter (Signed)
See previous mychart encounter.

## 2021-06-13 ENCOUNTER — Encounter: Payer: Self-pay | Admitting: Family Medicine

## 2021-06-13 NOTE — Telephone Encounter (Signed)
Pt called me on my cell about high glucose ([phone]) but I was away from home and computer.  I asked him to call his endo. Called him back later- he is awaiting specific instructions.  Offered to bring him in to do long acting insulin start tomorrow- for the time being he prefers to see what endo thinks but he will keep me posted

## 2021-06-27 NOTE — Progress Notes (Signed)
Subjective:   Peter Scott is a 87 y.o. male who presents for Medicare Annual/Subsequent preventive examination.  I connected with Peter Scott today by telephone and verified that I am speaking with the correct person using two identifiers. Location patient: home Location provider: work Persons participating in the virtual visit: patient, Engineer, civil (consulting).    I discussed the limitations, risks, security and privacy concerns of performing an evaluation and management service by telephone and the availability of in person appointments. I also discussed with the patient that there may be a patient responsible charge related to this service. The patient expressed understanding and verbally consented to this telephonic visit.    Interactive audio and video telecommunications were attempted between this provider and patient, however failed, due to patient having technical difficulties OR patient did not have access to video capability.  We continued and completed visit with audio only.  Some vital signs may be absent or patient reported.   Time Spent with patient on telephone encounter: 20 minutes   Review of Systems     Cardiac Risk Factors include: advanced age (>45men, >36 women);male gender;diabetes mellitus;dyslipidemia;hypertension     Objective:    Today's Vitals   06/28/21 0820  Weight: 169 lb (76.7 kg)  Height: 5\' 9"  (1.753 m)   Body mass index is 24.96 kg/m.  Advanced Directives 06/28/2021 06/22/2020 08/27/2018 07/14/2018 04/01/2018 03/28/2018 03/26/2018  Does Patient Have a Medical Advance Directive? Yes Yes No Yes Yes Yes Yes  Type of 05/26/2018 of [place];Living will Healthcare Power of [place];Living will - Healthcare Power of [place];Living will Healthcare Power of [place] of [place];Living will Healthcare Power of [place];Living will  Does patient want to make changes to medical advance directive? - No - Patient declined - - No - Patient declined  No - Patient declined No - Patient declined  Copy of Healthcare Power of Attorney in Chart? Yes - validated most recent copy scanned in chart (See row information) Yes - validated most recent copy scanned in chart (See row information) - No - copy requested No - copy requested No - copy requested No - copy requested  Would patient like information on creating a medical advance directive? - - No - Patient declined - - No - Patient declined -    Current Medications (verified) Outpatient Encounter Medications as of 06/28/2021  Medication Sig   acetaminophen (TYLENOL) 500 MG tablet Take 1 tablet (500 mg total) by mouth every 6 (six) hours as needed. (Patient taking differently: Take 500 mg by mouth every 6 (six) hours as needed for moderate pain or headache.)   amiodarone (PACERONE) 200 MG tablet Take 1 tablet (200 mg total) by mouth daily.   amLODipine (NORVASC) 5 MG tablet Take 1 tablet (5 mg total) by mouth daily.   amoxicillin (AMOXIL) 500 MG capsule TAKE 4 CAPSULES BY MOUTH AS DIRECTED ONE HOUR PRIOR TO DENTAL VISITS   apixaban (ELIQUIS) 2.5 MG TABS tablet Take 1 tablet (2.5 mg total) by mouth 2 (two) times daily.   atorvastatin (LIPITOR) 40 MG tablet Take 40 mg by mouth daily.   Blood Glucose Monitoring Suppl (CONTOUR NEXT EZ) w/Device KIT 1 kit by Does not apply route daily. Use once a day to check blood sugar.  Dx code: E11.9   empagliflozin (JARDIANCE) 10 MG TABS tablet TAKE ONE TABLET BY MOUTH DAILY -DISCONTINUE GLYBURIDE DISCONTINUE GLYBURIDE.   glucose blood (CONTOUR NEXT TEST) test strip Use once a day to check blood sugar.  Dx  code: E11.9   glucose blood test strip Check blood sugars once daily   losartan (COZAAR) 100 MG tablet Take 0.5 tablets (50 mg total) by mouth 2 (two) times daily.   spironolactone (ALDACTONE) 25 MG tablet Take 25 mg by mouth daily. Patient takes 1 tablet in the morning and 1/2 tablet in the evening   Alogliptin Benzoate 6.25 MG TABS Take 6.25 mg by mouth daily.    No facility-administered encounter medications on file as of 06/28/2021.    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   26 mm Edwards Sapien 3 transcatheter heart valve placed via percutaneous right transfemoral approach    Umbilical hernia    Past Surgical History:  Procedure Laterality Date   ABLATION     Rhythm problem; rhythm unknown; [place]   CARDIOVERSION N/A 07/14/2018   Procedure: CARDIOVERSION;  Surgeon: Dorothy Spark, MD;  Location: [facility];  Service: Cardiovascular;  Laterality: N/A;   CARDIOVERSION N/A 08/27/2018   Procedure: CARDIOVERSION;  Surgeon: Jerline Pain, MD;  Location: [facility];  Service: Cardiovascular;  Laterality: N/A;   CORONARY ARTERY BYPASS GRAFT  2001   EXPLORATION POST OPERATIVE OPEN HEART  2001, June   INTRAOPERATIVE TRANSTHORACIC ECHOCARDIOGRAM N/A 04/01/2018   Procedure: INTRAOPERATIVE TRANSTHORACIC ECHOCARDIOGRAM;  Surgeon: Burnell Blanks, MD;  Location: [facility];  Service: Open Heart Surgery;  Laterality: N/A;   RIGHT/LEFT HEART CATH AND CORONARY/GRAFT ANGIOGRAPHY N/A 01/31/2018   Procedure: RIGHT/LEFT HEART CATH AND CORONARY/GRAFT ANGIOGRAPHY;  Surgeon: Burnell Blanks, MD;  Location: [facility];  Service: Cardiovascular;  Laterality: N/A;   TEE WITHOUT CARDIOVERSION N/A 01/17/2018   Procedure: TRANSESOPHAGEAL ECHOCARDIOGRAM (TEE);  Surgeon: Lelon Perla, MD;  Location: [facility];  Service: Cardiovascular;  Laterality: N/A;   TEE WITHOUT CARDIOVERSION N/A 07/14/2018   Procedure: TRANSESOPHAGEAL ECHOCARDIOGRAM (TEE);  Surgeon: Dorothy Spark, MD;   Location: [employer];  Service: Cardiovascular;  Laterality: N/A;   TONSILLECTOMY     TRANSCATHETER AORTIC VALVE REPLACEMENT, TRANSFEMORAL N/A 04/01/2018   Procedure: TRANSCATHETER AORTIC VALVE REPLACEMENT, TRANSFEMORAL;  Surgeon: Burnell Blanks, MD;  Location: [facility];  Service: Open Heart Surgery;  Laterality: N/A;   UMBILICAL HERNIA REPAIR  2015   Family History  Problem Relation Age of Onset   Diabetes Mother 59       Deceased   Heart disease Father 54       Deceased   Heart disease Maternal Uncle    Diabetes Maternal Uncle    Heart disease Brother    Diabetes Brother    Diabetes Sister        #1   Heart disease Sister        #2   Hyperlipidemia Son        x2   Social History   Socioeconomic History   Marital status: Married    Spouse name: Not on file   Number of children: 2   Years of education: Not on file   Highest education level: Not on file  Occupational History   Not  on file  Tobacco Use   Smoking status: Former    Packs/day: 1.00    Years: 3.00    Pack years: 3.00    Types: Cigarettes   Smokeless tobacco: Never   Tobacco comments:    quit tobbaco in his 68s  Vaping Use   Vaping Use: Never used  Substance and Sexual Activity   Alcohol use: No    Alcohol/week: 0.0 standard drinks   Drug use: No   Sexual activity: Not on file  Other Topics Concern   Not on file  Social History Narrative   Not on file   Social Determinants of Health   Financial Resource Strain: Low Risk    Difficulty of Paying Living Expenses: Not hard at all  Food Insecurity: No Food Insecurity   Worried About Charity fundraiser in the Last Year: Never true   Ran Out of Food in the Last Year: Never true  Transportation Needs: No Transportation Needs   Lack of Transportation (Medical): No   Lack of Transportation (Non-Medical): No  Physical Activity: Insufficiently Active   Days of Exercise per Week: 4 days   Minutes of Exercise per Session: 30 min  Stress: No  Stress Concern Present   Feeling of Stress : Not at all  Social Connections: Moderately Integrated   Frequency of Communication with Friends and Family: More than three times a week   Frequency of Social Gatherings with Friends and Family: More than three times a week   Attends Religious Services: More than 4 times per year   Active Member of [employer] or Organizations: No   Attends Archivist Meetings: Never   Marital Status: Married    Tobacco Counseling Counseling given: Not Answered Tobacco comments: quit tobbaco in his 43s   Clinical Intake:  Pre-visit preparation completed: Yes  Pain : No/denies pain     Nutritional Status: BMI of 19-24  Normal Nutritional Risks: None Diabetes: No  How often do you need to have someone help you when you read instructions, pamphlets, or other written materials from your doctor or pharmacy?: 1 - Never  Diabetes:  Is the patient diabetic?  Yes  If diabetic, was a CBG obtained today?  No  Did the patient bring in their glucometer from home?  No phone visit How often do you monitor your CBG's? daily.   Financial Strains and Diabetes Management:  Are you having any financial strains with the device, your supplies or your medication? No .  Does the patient want to be seen by Chronic Care Management for management of their diabetes?  No  Would the patient like to be referred to a Nutritionist or for Diabetic Management?  No   Diabetic Exams:  Diabetic Eye Exam: Completed 10/2020 [facility].   Diabetic Foot Exam: Completed 09/26/2020.    Interpreter Needed?: No  Information entered by :: Caroleen Hamman LPN   Activities of Daily Living In your present state of health, do you have any difficulty performing the following activities: 06/28/2021  Hearing? N  Vision? N  Difficulty concentrating or making decisions? N  Walking or climbing stairs? N  Dressing or bathing? N  Doing errands, shopping? N   Preparing Food and eating ? N  Using the Toilet? N  In the past six months, have you accidently leaked urine? N  Do you have problems with loss of bowel control? N  Managing your Medications? N  Managing your Finances? N  Housekeeping  or managing your Housekeeping? N  Some recent data might be hidden    Patient Care Team: Copland, Gay Filler, MD as PCP - General (Family Medicine) Hollar, Katharine Look, MD as Referring Physician (Dermatology) Cherre Robins, PharmD (Pharmacist)  Indicate any recent Medical Services you may have received from other than [facility] providers in the past year (date may be approximate).     Assessment:   This is a routine wellness examination for [place].  Hearing/Vision screen Hearing Screening - Comments:: No issues Vision Screening - Comments:: [facility]  Dietary issues and exercise activities discussed: Current Exercise Habits: Home exercise routine, Type of exercise: Other - see comments (farming * yard work), Time (Minutes): 30, Frequency (Times/Week): 4, Weekly Exercise (Minutes/Week): 120, Intensity: Mild   Goals Addressed             This Visit's Progress    maintain healthy lifestyle.   On track      Depression Screen PHQ 2/9 Scores 06/28/2021 06/22/2020 10/23/2017 10/29/2016 10/10/2015 10/04/2014 07/08/2014  PHQ - 2 Score 0 0 0 0 0 0 0    Fall Risk Fall Risk  06/28/2021 06/02/2021 06/22/2020 10/08/2018 10/30/2017  Falls in the past year? 0 0 0 0 No  Comment - - - Emmi Telephone Survey: data to providers prior to load -  Number falls in past yr: 0 0 0 - -  Injury with Fall? 0 0 0 - -  Risk for fall due to : - No Fall Risks;Impaired balance/gait - - -  Follow up Falls prevention discussed Falls evaluation completed Education provided;Falls prevention discussed - -    FALL RISK PREVENTION PERTAINING TO THE HOME:  Any stairs in or around the home? No  Home free of loose throw rugs in walkways, pet beds, electrical cords,  etc? Yes  Adequate lighting in your home to reduce risk of falls? Yes   ASSISTIVE DEVICES UTILIZED TO PREVENT FALLS:  Life alert? No  Use of a cane, walker or w/c? No  Grab bars in the bathroom? Yes  Shower chair or bench in shower? No  Elevated toilet seat or a handicapped toilet? No   TIMED UP AND GO:  Was the test performed? No . Phone visit   Cognitive Function:Normal cognitive status assessed by this Nurse Health Advisor. No abnormalities found.          Immunizations Immunization History  Administered Date(s) Administered   Fluad Quad(high Dose 65+) 08/12/2019, 08/12/2020   Influenza, High Dose Seasonal PF 08/25/2015, 09/02/2017, 08/25/2018   Influenza,inj,Quad PF,6+ Mos 09/01/2014, 08/27/2016   Moderna SARS-COV2 Booster Vaccination 09/26/2020   Moderna Sars-Covid-2 Vaccination 12/23/2019, 01/20/2020, 09/26/2020, 05/23/2021   Pneumococcal Conjugate-13 10/04/2014   Pneumococcal Polysaccharide-23 10/10/2015   Tdap 01/05/2015   Zoster Recombinat (Shingrix) 05/27/2020, 08/26/2020    TDAP status: Up to date  Flu vaccine status: Due-07/2021  Pneumococcal vaccine status: Up to date  Covid-19 vaccine status: Completed vaccines  Qualifies for Shingles Vaccine? No   Zostavax completed No   Shingrix Completed?: Yes  Screening Tests Health Maintenance  Topic Date Due   OPHTHALMOLOGY EXAM  11/02/2020   INFLUENZA VACCINE  06/19/2021   COVID-19 Vaccine (5 - Booster for Moderna series) 09/23/2021   FOOT EXAM  09/26/2021   HEMOGLOBIN A1C  11/23/2021   TETANUS/TDAP  01/05/2025   PNA vac Low Risk Adult  Completed   Zoster Vaccines- Shingrix  Completed   HPV VACCINES  Aged Out    Health Maintenance  Health Maintenance  Due  Topic Date Due   OPHTHALMOLOGY EXAM  11/02/2020   INFLUENZA VACCINE  06/19/2021    Colorectal cancer screening: No longer required.   Lung Cancer Screening: (Low Dose CT Chest recommended if Age 33-80 years, 30 pack-year currently smoking OR  have quit w/in 15years.) does not qualify.     Additional Screening:  Hepatitis C Screening: does not qualify  Vision Screening: Recommended annual ophthalmology exams for early detection of glaucoma and other disorders of the eye. Is the patient up to date with their annual eye exam?  Yes  Who is the provider or what [facility] the [facility]   Dental Screening: Recommended annual dental exams for proper oral hygiene  Community Resource Referral / Chronic Care Management: CRR required this visit?  No   CCM required this visit?  No      Plan:     I have personally reviewed and noted the following in the patient's chart:   Medical and social history Use of alcohol, tobacco or illicit drugs  Current medications and supplements including opioid prescriptions. Patient is not currently taking opioid prescriptions. Functional ability and status Nutritional status Physical activity Advanced directives List of other physicians Hospitalizations, surgeries, and ER visits in previous 12 months Vitals Screenings to include cognitive, depression, and falls Referrals and appointments  In addition, I have reviewed and discussed with patient certain preventive protocols, quality metrics, and best practice recommendations. A written personalized care plan for preventive services as well as general preventive health recommendations were provided to patient.   Due to this being a telephonic visit, the after visit summary with patients personalized plan was offered to patient via mail or my-chart. Patient would like to access on my-chart   Marta Antu, LPN   [id number]  Nurse Health Advisor  Nurse Notes: None

## 2021-06-28 ENCOUNTER — Ambulatory Visit (INDEPENDENT_AMBULATORY_CARE_PROVIDER_SITE_OTHER): Payer: Medicare Other

## 2021-06-28 VITALS — Ht 69.0 in | Wt 169.0 lb

## 2021-06-28 DIAGNOSIS — Z0000 Encounter for general adult medical examination without abnormal findings: Secondary | ICD-10-CM | POA: Diagnosis not present

## 2021-06-28 NOTE — Patient Instructions (Signed)
[name] , Thank you for taking time to complete your Medicare Wellness Visit. I appreciate your ongoing commitment to your health goals. Please review the following plan we discussed and let me know if I can assist you in the future.   Screening recommendations/referrals: Colonoscopy: No longer required Recommended yearly ophthalmology/optometry visit for glaucoma screening and checkup Recommended yearly dental visit for hygiene and checkup  Vaccinations: Influenza vaccine: Up to date Pneumococcal vaccine: Up to date Tdap vaccine: Up to date-Due-01/05/2025 Shingles vaccine: Completed vaccines   Covid-19: Up to date  Advanced directives: Copy in chart  Conditions/risks identified: See problem list  Next appointment: Follow up in one year for your annual wellness visit.   Preventive Care 85 Years and Older, Male Preventive care refers to lifestyle choices and visits with your health care provider that can promote health and wellness. What does preventive care include? A yearly physical exam. This is also called an annual well check. Dental exams once or twice a year. Routine eye exams. Ask your health care provider how often you should have your eyes checked. Personal lifestyle choices, including: Daily care of your teeth and gums. Regular physical activity. Eating a healthy diet. Avoiding tobacco and drug use. Limiting alcohol use. Practicing safe sex. Taking low doses of aspirin every day. Taking vitamin and mineral supplements as recommended by your health care provider. What happens during an annual well check? The services and screenings done by your health care provider during your annual well check will depend on your age, overall health, lifestyle risk factors, and family history of disease. Counseling  Your health care provider may ask you questions about your: Alcohol use. Tobacco use. Drug use. Emotional well-being. Home and relationship well-being. Sexual  activity. Eating habits. History of falls. Memory and ability to understand (cognition). Work and work Statistician. Screening  You may have the following tests or measurements: Height, weight, and BMI. Blood pressure. Lipid and cholesterol levels. These may be checked every 5 years, or more frequently if you are over 85 years old. Skin check. Lung cancer screening. You may have this screening every year starting at age 85 if you have a 30-pack-year history of smoking and currently smoke or have quit within the past 15 years. Fecal occult blood test (FOBT) of the stool. You may have this test every year starting at age 85. Flexible sigmoidoscopy or colonoscopy. You may have a sigmoidoscopy every 5 years or a colonoscopy every 10 years starting at age 85. Prostate cancer screening. Recommendations will vary depending on your family history and other risks. Hepatitis C blood test. Hepatitis B blood test. Sexually transmitted disease (STD) testing. Diabetes screening. This is done by checking your blood sugar (glucose) after you have not eaten for a while (fasting). You may have this done every 1-3 years. Abdominal aortic aneurysm (AAA) screening. You may need this if you are a current or former smoker. Osteoporosis. You may be screened starting at age 85 if you are at high risk. Talk with your health care provider about your test results, treatment options, and if necessary, the need for more tests. Vaccines  Your health care provider may recommend certain vaccines, such as: Influenza vaccine. This is recommended every year. Tetanus, diphtheria, and acellular pertussis (Tdap, Td) vaccine. You may need a Td booster every 10 years. Zoster vaccine. You may need this after age 30. Pneumococcal 13-valent conjugate (PCV13) vaccine. One dose is recommended after age 60. Pneumococcal polysaccharide (PPSV23) vaccine. One dose is recommended after  age 50. Talk to your health care provider about which  screenings and vaccines you need and how often you need them. This information is not intended to replace advice given to you by your health care provider. Make sure you discuss any questions you have with your health care provider. Document Released: 12/02/2015 Document Revised: 07/25/2016 Document Reviewed: 09/06/2015 Elsevier Interactive Patient Education  2017 [place] Prevention in the Home Falls can cause injuries. They can happen to people of all ages. There are many things you can do to make your home safe and to help prevent falls. What can I do on the outside of my home? Regularly fix the edges of walkways and driveways and fix any cracks. Remove anything that might make you trip as you walk through a door, such as a raised step or threshold. Trim any bushes or trees on the path to your home. Use bright outdoor lighting. Clear any walking paths of anything that might make someone trip, such as rocks or tools. Regularly check to see if handrails are loose or broken. Make sure that both sides of any steps have handrails. Any raised decks and porches should have guardrails on the edges. Have any leaves, snow, or ice cleared regularly. Use sand or salt on walking paths during winter. Clean up any spills in your garage right away. This includes oil or grease spills. What can I do in the bathroom? Use night lights. Install grab bars by the toilet and in the tub and shower. Do not use towel bars as grab bars. Use non-skid mats or decals in the tub or shower. If you need to sit down in the shower, use a plastic, non-slip stool. Keep the floor dry. Clean up any water that spills on the floor as soon as it happens. Remove soap buildup in the tub or shower regularly. Attach bath mats securely with double-sided non-slip rug tape. Do not have throw rugs and other things on the floor that can make you trip. What can I do in the bedroom? Use night lights. Make sure that you have a  light by your bed that is easy to reach. Do not use any sheets or blankets that are too big for your bed. They should not hang down onto the floor. Have a firm chair that has side arms. You can use this for support while you get dressed. Do not have throw rugs and other things on the floor that can make you trip. What can I do in the kitchen? Clean up any spills right away. Avoid walking on wet floors. Keep items that you use a lot in easy-to-reach places. If you need to reach something above you, use a strong step stool that has a grab bar. Keep electrical cords out of the way. Do not use floor polish or wax that makes floors slippery. If you must use wax, use non-skid floor wax. Do not have throw rugs and other things on the floor that can make you trip. What can I do with my stairs? Do not leave any items on the stairs. Make sure that there are handrails on both sides of the stairs and use them. Fix handrails that are broken or loose. Make sure that handrails are as long as the stairways. Check any carpeting to make sure that it is firmly attached to the stairs. Fix any carpet that is loose or worn. Avoid having throw rugs at the top or bottom of the stairs. If you do  have throw rugs, attach them to the floor with carpet tape. Make sure that you have a light switch at the top of the stairs and the bottom of the stairs. If you do not have them, ask someone to add them for you. What else can I do to help prevent falls? Wear shoes that: Do not have high heels. Have rubber bottoms. Are comfortable and fit you well. Are closed at the toe. Do not wear sandals. If you use a stepladder: Make sure that it is fully opened. Do not climb a closed stepladder. Make sure that both sides of the stepladder are locked into place. Ask someone to hold it for you, if possible. Clearly mark and make sure that you can see: Any grab bars or handrails. First and last steps. Where the edge of each step  is. Use tools that help you move around (mobility aids) if they are needed. These include: Canes. Walkers. Scooters. Crutches. Turn on the lights when you go into a dark area. Replace any light bulbs as soon as they burn out. Set up your furniture so you have a clear path. Avoid moving your furniture around. If any of your floors are uneven, fix them. If there are any pets around you, be aware of where they are. Review your medicines with your doctor. Some medicines can make you feel dizzy. This can increase your chance of falling. Ask your doctor what other things that you can do to help prevent falls. This information is not intended to replace advice given to you by your health care provider. Make sure you discuss any questions you have with your health care provider. Document Released: 09/01/2009 Document Revised: 04/12/2016 Document Reviewed: 12/10/2014 Elsevier Interactive Patient Education  2017 [employer].

## 2021-07-12 DIAGNOSIS — H6123 Impacted cerumen, bilateral: Secondary | ICD-10-CM | POA: Diagnosis not present

## 2021-08-01 ENCOUNTER — Encounter: Payer: Self-pay | Admitting: Family Medicine

## 2021-08-01 DIAGNOSIS — E1122 Type 2 diabetes mellitus with diabetic chronic kidney disease: Secondary | ICD-10-CM

## 2021-08-03 ENCOUNTER — Telehealth: Payer: Medicare Other

## 2021-08-10 ENCOUNTER — Other Ambulatory Visit (HOSPITAL_COMMUNITY): Payer: Self-pay

## 2021-08-18 ENCOUNTER — Ambulatory Visit (INDEPENDENT_AMBULATORY_CARE_PROVIDER_SITE_OTHER): Payer: Medicare Other

## 2021-08-18 ENCOUNTER — Other Ambulatory Visit: Payer: Self-pay

## 2021-08-18 ENCOUNTER — Ambulatory Visit: Payer: Medicare Other | Attending: Internal Medicine

## 2021-08-18 DIAGNOSIS — Z23 Encounter for immunization: Secondary | ICD-10-CM | POA: Diagnosis not present

## 2021-08-18 NOTE — Progress Notes (Signed)
   Covid-19 Vaccination Clinic  Name:  SAMUL MCINROY    MRN: [id number] DOB: 1932-01-20  08/18/2021  Mr. Boike was observed post Covid-19 immunization for 15 minutes without incident. He was provided with Vaccine Information Sheet and instruction to access the V-Safe system.   Mr. Gatt was instructed to call 911 with any severe reactions post vaccine: Difficulty breathing  Swelling of face and throat  A fast heartbeat  A bad rash all over body  Dizziness and weakness

## 2021-08-18 NOTE — Progress Notes (Signed)
Pt tolerated well

## 2021-08-29 ENCOUNTER — Other Ambulatory Visit (HOSPITAL_BASED_OUTPATIENT_CLINIC_OR_DEPARTMENT_OTHER): Payer: Self-pay

## 2021-08-29 DIAGNOSIS — Z23 Encounter for immunization: Secondary | ICD-10-CM | POA: Diagnosis not present

## 2021-08-29 MED ORDER — MODERNA COVID-19 BIVAL BOOSTER 50 MCG/0.5ML IM SUSP
INTRAMUSCULAR | 0 refills | Status: DC
  Filled 2021-08-29: qty 0.5, 1d supply, fill #0

## 2021-09-11 DIAGNOSIS — N1832 Chronic kidney disease, stage 3b: Secondary | ICD-10-CM | POA: Diagnosis not present

## 2021-09-11 DIAGNOSIS — I25810 Atherosclerosis of coronary artery bypass graft(s) without angina pectoris: Secondary | ICD-10-CM | POA: Diagnosis not present

## 2021-09-11 DIAGNOSIS — E1122 Type 2 diabetes mellitus with diabetic chronic kidney disease: Secondary | ICD-10-CM | POA: Diagnosis not present

## 2021-09-11 DIAGNOSIS — E1165 Type 2 diabetes mellitus with hyperglycemia: Secondary | ICD-10-CM | POA: Diagnosis not present

## 2021-09-11 DIAGNOSIS — Z794 Long term (current) use of insulin: Secondary | ICD-10-CM | POA: Diagnosis not present

## 2021-09-11 DIAGNOSIS — I129 Hypertensive chronic kidney disease with stage 1 through stage 4 chronic kidney disease, or unspecified chronic kidney disease: Secondary | ICD-10-CM | POA: Diagnosis not present

## 2021-09-11 DIAGNOSIS — E1169 Type 2 diabetes mellitus with other specified complication: Secondary | ICD-10-CM | POA: Diagnosis not present

## 2021-09-11 DIAGNOSIS — E785 Hyperlipidemia, unspecified: Secondary | ICD-10-CM | POA: Diagnosis not present

## 2021-09-20 DIAGNOSIS — Z20828 Contact with and (suspected) exposure to other viral communicable diseases: Secondary | ICD-10-CM | POA: Diagnosis not present

## 2021-09-26 NOTE — Progress Notes (Signed)
HPI: FU CAD and AS. Patient is status post coronary artery bypass graft in [place] in 2001. He also had ablation of what sounds to be SVT at that time. Carotid Dopplers March 2019 showed 1 to 39% bilateral stenosis. Patient had preoperative cardiac catheterization that revealed severe native three-vessel coronary artery disease with patency of grafts.  He underwent TAVR 5/19. Also with h/o PAF. Abd ultrasound 2/21 showed no AAA. Echocardiogram April 2021 showed normal LV function, mild mitral regurgitation, previous TAVR with trivial perivalvular aortic insufficiency.  Since last seen,   Current Outpatient Medications  Medication Sig Dispense Refill   acetaminophen (TYLENOL) 500 MG tablet Take 1 tablet (500 mg total) by mouth every 6 (six) hours as needed. (Patient taking differently: Take 500 mg by mouth every 6 (six) hours as needed for moderate pain or headache.) 30 tablet 0   amiodarone (PACERONE) 200 MG tablet Take 1 tablet (200 mg total) by mouth daily. 90 tablet 3   amLODipine (NORVASC) 5 MG tablet Take 1 tablet (5 mg total) by mouth daily.     amoxicillin (AMOXIL) 500 MG capsule TAKE 4 CAPSULES BY MOUTH AS DIRECTED ONE HOUR PRIOR TO DENTAL VISITS 8 capsule 6   apixaban (ELIQUIS) 2.5 MG TABS tablet Take 1 tablet (2.5 mg total) by mouth 2 (two) times daily.     atorvastatin (LIPITOR) 40 MG tablet Take 40 mg by mouth daily.     Blood Glucose Monitoring Suppl (CONTOUR NEXT EZ) w/Device KIT 1 kit by Does not apply route daily. Use once a day to check blood sugar.  Dx code: E11.9 1 kit 0   Cholecalciferol 25 MCG (1000 UT) tablet TAKE ONE TABLET BY MOUTH DAILY FOR LOW VITAMIN D LEVEL     glucose blood (CONTOUR NEXT TEST) test strip Use once a day to check blood sugar.  Dx code: E11.9 100 each 12   insulin glargine (LANTUS) 100 UNIT/ML injection Inject 14 Units into the skin at bedtime.     insulin glargine-yfgn (SEMGLEE) 100 UNIT/ML Pen INJECT 20 UNITS SUBCUTANEOUSLY AT BEDTIME (USE WITHIN  28 DAYS AFTER OPENING PEN)     Insulin Pen Needle (BD PEN NEEDLE NANO U/F) 32G X 4 MM MISC by Does not apply route.     losartan (COZAAR) 100 MG tablet Take 0.5 tablets (50 mg total) by mouth 2 (two) times daily. 90 tablet 1   spironolactone (ALDACTONE) 25 MG tablet Take 25 mg by mouth daily. Patient takes 1 tablet in the morning and 1/2 tablet in the evening     COVID-19 mRNA bivalent vaccine, Moderna, (MODERNA COVID-19 BIVAL BOOSTER) 50 MCG/0.5ML injection Inject into the muscle. (Patient not taking: Reported on 10/04/2021) 0.5 mL 0   No current facility-administered medications for this visit.     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   26 mm Edwards Sapien 3 transcatheter heart valve placed via percutaneous right transfemoral approach    Umbilical hernia     Past Surgical History:  Procedure Laterality Date   ABLATION     Rhythm problem; rhythm unknown;  [place]   CARDIOVERSION N/A 07/14/2018   Procedure: CARDIOVERSION;  Surgeon: Dorothy Spark, MD;  Location: [facility] ENDOSCOPY;  Service: Cardiovascular;  Laterality: N/A;   CARDIOVERSION N/A 08/27/2018   Procedure: CARDIOVERSION;  Surgeon: Jerline Pain, MD;  Location: [facility] ENDOSCOPY;  Service: Cardiovascular;  Laterality: N/A;   CORONARY ARTERY BYPASS GRAFT  2001   EXPLORATION POST OPERATIVE OPEN HEART  2001, June   INTRAOPERATIVE TRANSTHORACIC ECHOCARDIOGRAM N/A 04/01/2018   Procedure: INTRAOPERATIVE TRANSTHORACIC ECHOCARDIOGRAM;  Surgeon: Burnell Blanks, MD;  Location: [place];  Service: Open Heart Surgery;  Laterality: N/A;   RIGHT/LEFT HEART CATH AND CORONARY/GRAFT ANGIOGRAPHY N/A 01/31/2018   Procedure:  RIGHT/LEFT HEART CATH AND CORONARY/GRAFT ANGIOGRAPHY;  Surgeon: Burnell Blanks, MD;  Location: [place] CV LAB;  Service: Cardiovascular;  Laterality: N/A;   TEE WITHOUT CARDIOVERSION N/A 01/17/2018   Procedure: TRANSESOPHAGEAL ECHOCARDIOGRAM (TEE);  Surgeon: Lelon Perla, MD;  Location: [facility] ENDOSCOPY;  Service: Cardiovascular;  Laterality: N/A;   TEE WITHOUT CARDIOVERSION N/A 07/14/2018   Procedure: TRANSESOPHAGEAL ECHOCARDIOGRAM (TEE);  Surgeon: Dorothy Spark, MD;  Location: [place];  Service: Cardiovascular;  Laterality: N/A;   TONSILLECTOMY     TRANSCATHETER AORTIC VALVE REPLACEMENT, TRANSFEMORAL N/A 04/01/2018   Procedure: TRANSCATHETER AORTIC VALVE REPLACEMENT, TRANSFEMORAL;  Surgeon: Burnell Blanks, MD;  Location: [place];  Service: Open Heart Surgery;  Laterality: N/A;   UMBILICAL HERNIA REPAIR  2015    Social History   Socioeconomic History   Marital status: Married    Spouse name: Not on file   Number of children: 2   Years of education: Not on file   Highest education level: Not on file  Occupational History   Not on file  Tobacco Use   Smoking status: Former    Packs/day: 1.00    Years: 3.00    Pack years: 3.00    Types: Cigarettes   Smokeless tobacco: Never   Tobacco comments:    quit tobbaco in his 19s  Vaping Use   Vaping Use: Never used  Substance and Sexual Activity   Alcohol use: No    Alcohol/week: 0.0 standard drinks   Drug use: No   Sexual activity: Not on file  Other Topics Concern   Not on file  Social History Narrative   Not on file   Social Determinants of Health   Financial Resource Strain: Low Risk    Difficulty of Paying Living Expenses: Not hard at all  Food Insecurity: No Food Insecurity   Worried About Charity fundraiser in the Last Year: Never true   [place] in the Last Year: Never true  Transportation Needs: No Transportation Needs   Lack of Transportation (Medical): No   Lack of Transportation  (Non-Medical): No  Physical Activity: Insufficiently Active   Days of Exercise per Week: 4 days   Minutes of Exercise per Session: 30 min  Stress: No Stress Concern Present   Feeling of Stress : Not at all  Social Connections: Moderately Integrated   Frequency of Communication with Friends and Family: More than three times a week   Frequency of Social Gatherings with Friends and Family: More than three times a week   Attends Religious Services: More than 4 times per year   Active Member of [employer] or Organizations: No   Attends Archivist Meetings: Never   Marital Status: Married  Human resources officer Violence: Not At Risk   Fear of Current or Ex-Partner: No  Emotionally Abused: No   Physically Abused: No   Sexually Abused: No    Family History  Problem Relation Age of Onset   Diabetes Mother 39       Deceased   Heart disease Father 26       Deceased   Heart disease Maternal Uncle    Diabetes Maternal Uncle    Heart disease Brother    Diabetes Brother    Diabetes Sister        #1   Heart disease Sister        #2   Hyperlipidemia Son        x2    ROS: no fevers or chills, productive cough, hemoptysis, dysphasia, odynophagia, melena, hematochezia, dysuria, hematuria, rash, seizure activity, orthopnea, PND, pedal edema, claudication. Remaining systems are negative.  Physical Exam: Well-developed well-nourished in no acute distress.  Skin is warm and dry.  HEENT is normal.  Neck is supple.  Chest is clear to auscultation with normal expansion.  Cardiovascular exam is regular rate and rhythm.  Abdominal exam nontender or distended. No masses palpated. Extremities show no edema. neuro grossly intact  ECG-sinus bradycardia at a rate of 46, first-degree AV block.  Personally reviewed  A/P  1 paroxysmal atrial fibrillation-patient remains in sinus.  We will continue amiodarone.  He is bradycardic but is having no symptoms and we will follow heart rate closely.   Check TSH and chest x-ray.  Recent liver functions normal.  Continue apixaban.    2 hypertension-blood pressure controlled.  However he has significant renal insufficiency with creatinine 2.1.  I will discontinue spironolactone.  In 1 week check potassium and renal function.  If he develops higher blood pressure then will adjust regimen as needed.  We will also follow for worsening edema and add Lasix if needed.  3 history of TAVR-continue SBE prophylaxis.  We will likely repeat echocardiogram when he returns in 6 months.  4 hyperlipidemia-continue statin.  5 coronary artery disease-patient denies chest pain.  Continue statin.  No aspirin given need for anticoagulation.   Kirk Ruths, MD

## 2021-09-28 NOTE — Progress Notes (Signed)
[place] [address], [place], [place] [address] [phone]  Date:  10/04/2021   Name:  JAMIL ARMWOOD   DOB:  1932/04/06   MRN:  [id number]  PCP:  [name], MD    Chief Complaint: 4 month follow up (Concerns/ questions:  none/Flu shot today: received already)   History of Present Illness:  Peter Scott is a 87 y.o. very pleasant male patient who presents with the following:  Patient seen today for periodic follow-up visit Most recent visit with myself was in July History of diabetes, atrial fibrillation, aortic valve replacement, CABG, skin cancer, hypertension, hyperlipidemia  [facility] Assessment and Plan: -------------------- 1. AKI on CKD IIIb: B Cr ~ 1.5 -1.7, likely from longstanding hypertension + diabetes mellitus type 2 + heart disease. AKI with serum creatinine 2.1 in 06/2021 -improved after stopping Jardiance, cutting back on spironolactone to 25 mg once a day, Metformin was also stopped. - K was still at 5.2, will repeat BMP today to follow-up on potassium and kidney function -Maintain good fluid intake -Maintain good blood pressure and diabetes control for renal longevity -Avoid NSAIDs -Follow low-salt diet and diabetic diet 2. Chronic Kidney Disease- [place] Bone Disorder: Vitamin D is low, on  cholecalciferol, continue 5. HTN: Blood pressures better controlled on the current regimen, follow low- salt diet and continue to monitor blood pressures 6. DM2: Per PCP and [facility]. Patient seems to be doing better.  He actually likes to follow-up with [place] endocrinology if available, will alert  PCP 7. AVOID ORAL AND PARENTERAL NSAIDS  Cardiology care through [facility] and the [place]- he is seeing Dr Heron Sabins later this morning   We had struggled some with his diabetes care over the last several months due to formulary restrictions at the  [place] and a drop in his renal function  Visit with endocrinology, Dr. Oren Binet with [place] on 10/24 1. Uncontrolled Type 2 Diabetes: Eryk raised his Lantus Solostar up to 20 units QHS. He has also made dietary changes and is trying to cut down on high starch/sugar foods. Blood sugars are checked 3 times a day. Morning fasting glucoses are 70-130s, pre-supper readings are mid-100s to mid-200s and post-supper glucoses are high-100s to mid-200s. He gets occasional post-supper readings in the 300s with high starch foods. Quaran's glycemic control has greatly improved. Most of his blood sugars are acceptable based on his age with goal fasting glucoses in the low 100s and post-meal glucoses up to low-mid 200s. His Lantus Solostar will be lowered back to 18 units QHS to reduce fasting hypoglycemia risk. Hgb A1C will be rechecked at his next visit. I will consider adding a GLP-1 agonist (Ozempic) or DPP-4 inhibitor (Alogliptin) if post-meal glucoses are mostly elevated.  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   S/P CABG x 4    Severe aortic stenosis    Medicare annual wellness visit, subsequent 10/04/2014   Bruit 09/29/2014   Abdominal aortic aneurysm 07/23/2014   BCC (basal cell  carcinoma) 07/23/2014   SCC (squamous cell carcinoma), arm 07/23/2014   Essential hypertension, benign 07/08/2014   Coronary artery disease 07/08/2014   Hyperlipidemia LDL goal <100 07/08/2014   Type II diabetes mellitus, well controlled ([place]) 07/08/2014   Colon cancer screening 07/08/2014    Past Medical History:  Diagnosis Date   Aortic aneurysm ([place])    Aortic stenosis    CAD (coronary artery disease)    Diabetes mellitus type II, controlled ([place])    Heart disease    Ablation   History of chicken pox    Hyperlipidemia    Hypertension    S/P CABG x 4    2001 - [place]   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   26 mm Edwards Sapien 3 transcatheter heart valve placed via percutaneous right transfemoral approach    Umbilical hernia     Past Surgical History:  Procedure Laterality Date   ABLATION     Rhythm problem; rhythm unknown; [place]   CARDIOVERSION N/A 07/14/2018   Procedure: CARDIOVERSION;  Surgeon: Dorothy Spark, MD;  Location: [place];  Service: Cardiovascular;  Laterality: N/A;   CARDIOVERSION N/A 08/27/2018   Procedure: CARDIOVERSION;  Surgeon: Jerline Pain, MD;  Location: [place] ENDOSCOPY;  Service: Cardiovascular;  Laterality: N/A;   CORONARY ARTERY BYPASS GRAFT  2001   EXPLORATION POST OPERATIVE OPEN HEART  2001, June   INTRAOPERATIVE TRANSTHORACIC ECHOCARDIOGRAM N/A 04/01/2018   Procedure: INTRAOPERATIVE TRANSTHORACIC ECHOCARDIOGRAM;  Surgeon: Burnell Blanks, MD;  Location: [place];  Service: Open Heart Surgery;  Laterality: N/A;   RIGHT/LEFT HEART CATH AND CORONARY/GRAFT ANGIOGRAPHY N/A 01/31/2018   Procedure: RIGHT/LEFT HEART CATH AND CORONARY/GRAFT ANGIOGRAPHY;  Surgeon: Burnell Blanks, MD;  Location: [place] CV LAB;  Service: Cardiovascular;  Laterality: N/A;   TEE WITHOUT CARDIOVERSION N/A 01/17/2018   Procedure: TRANSESOPHAGEAL ECHOCARDIOGRAM (TEE);  Surgeon: Lelon Perla, MD;  Location: [facility]  ENDOSCOPY;  Service: Cardiovascular;  Laterality: N/A;   TEE WITHOUT CARDIOVERSION N/A 07/14/2018   Procedure: TRANSESOPHAGEAL ECHOCARDIOGRAM (TEE);  Surgeon: Dorothy Spark, MD;  Location: [place];  Service: Cardiovascular;  Laterality: N/A;   TONSILLECTOMY     TRANSCATHETER AORTIC VALVE REPLACEMENT, TRANSFEMORAL N/A 04/01/2018   Procedure: TRANSCATHETER AORTIC VALVE REPLACEMENT, TRANSFEMORAL;  Surgeon: Burnell Blanks, MD;  Location: [place];  Service: Open Heart Surgery;  Laterality: N/A;   UMBILICAL HERNIA REPAIR  2015    Social History   Tobacco Use   Smoking status: Former    Packs/day: 1.00    Years: 3.00    Pack years: 3.00    Types: Cigarettes   Smokeless tobacco: Never   Tobacco comments:    quit tobbaco in his 39s  Vaping Use   Vaping Use: Never used  Substance Use Topics   Alcohol use: No    Alcohol/week: 0.0 standard drinks   Drug use: No    Family History  Problem Relation Age of Onset   Diabetes Mother 60       Deceased  Heart disease Father 43       Deceased   Heart disease Maternal Uncle    Diabetes Maternal Uncle    Heart disease Brother    Diabetes Brother    Diabetes Sister        #1   Heart disease Sister        #2   Hyperlipidemia Son        x2    Allergies  Allergen Reactions   Glipizide Palpitations    "Heart racing" "Heart racing"    Medication list has been reviewed and updated.  Current Outpatient Medications on File Prior to Visit  Medication Sig Dispense Refill   acetaminophen (TYLENOL) 500 MG tablet Take 1 tablet (500 mg total) by mouth every 6 (six) hours as needed. (Patient taking differently: Take 500 mg by mouth every 6 (six) hours as needed for moderate pain or headache.) 30 tablet 0   amiodarone (PACERONE) 200 MG tablet Take 1 tablet (200 mg total) by mouth daily. 90 tablet 3   amLODipine (NORVASC) 5 MG tablet Take 1 tablet (5 mg total) by mouth daily.     amoxicillin (AMOXIL) 500 MG capsule TAKE 4 CAPSULES  BY MOUTH AS DIRECTED ONE HOUR PRIOR TO DENTAL VISITS 8 capsule 6   apixaban (ELIQUIS) 2.5 MG TABS tablet Take 1 tablet (2.5 mg total) by mouth 2 (two) times daily.     atorvastatin (LIPITOR) 40 MG tablet Take 40 mg by mouth daily.     Blood Glucose Monitoring Suppl (CONTOUR NEXT EZ) w/Device KIT 1 kit by Does not apply route daily. Use once a day to check blood sugar.  Dx code: E11.9 1 kit 0   Cholecalciferol 25 MCG (1000 UT) tablet TAKE ONE TABLET BY MOUTH DAILY FOR LOW VITAMIN D LEVEL     COVID-19 mRNA bivalent vaccine, Moderna, (MODERNA COVID-19 BIVAL BOOSTER) 50 MCG/0.5ML injection Inject into the muscle. 0.5 mL 0   glucose blood (CONTOUR NEXT TEST) test strip Use once a day to check blood sugar.  Dx code: E11.9 100 each 12   insulin glargine-yfgn (SEMGLEE) 100 UNIT/ML Pen INJECT 20 UNITS SUBCUTANEOUSLY AT BEDTIME (USE WITHIN 28 DAYS AFTER OPENING PEN)     Insulin Pen Needle (BD PEN NEEDLE NANO U/F) 32G X 4 MM MISC by Does not apply route.     losartan (COZAAR) 100 MG tablet Take 0.5 tablets (50 mg total) by mouth 2 (two) times daily. 90 tablet 1   spironolactone (ALDACTONE) 25 MG tablet Take 25 mg by mouth daily. Patient takes 1 tablet in the morning and 1/2 tablet in the evening     No current facility-administered medications on file prior to visit.    Review of Systems:  As per HPI- otherwise negative.   Physical Examination: Vitals:   10/04/21 0832  BP: 132/60  Pulse: 74  Resp: 18  Temp: 98 F (36.7 C)  SpO2: 94%   Vitals:   10/04/21 0832  Weight: 162 lb 12.8 oz (73.8 kg)  Height: 5' 9" (1.753 m)   Body mass index is 24.04 kg/m. Ideal Body Weight: Weight in (lb) to have BMI = 25: 168.9  GEN: no acute distress.  Normal weight, looks well  HEENT: Atraumatic, Normocephalic.  Ears and Nose: No external deformity. CV: RRR- SR at this time, No M/G/R. No JVD. No thrill. No extra heart sounds. PULM: CTA B, no wheezes, crackles, rhonchi. No retractions. No resp. distress.  No accessory muscle use. ABD: S, NT, ND, +BS.  No rebound. No HSM. EXTR: No c/c/trace edema of both LE PSYCH: Normally interactive. Conversant.  Foot exam- normal   Assessment and Plan: Coronary artery disease due to lipid rich plaque  Stage 3b chronic kidney disease (HCC)  Essential hypertension, benign  Paroxysmal atrial fibrillation (HCC)  Controlled type 2 diabetes mellitus without complication, without long-term current use of insulin (HCC)  Hyperlipidemia LDL goal <100  Pt is doing very well- he is seeing cardiology, nephrology and endocrinology   He has no major concerns, his blood sugars come under much better control.  He remains in sinus rhythm We will plan to follow-up in 6 to 12 months Signed Lamar Blinks, MD

## 2021-09-28 NOTE — Patient Instructions (Addendum)
It was very nice to see you again today!  Assuming all is well please see me in about 6 months All looks good!

## 2021-10-04 ENCOUNTER — Other Ambulatory Visit: Payer: Self-pay

## 2021-10-04 ENCOUNTER — Ambulatory Visit (INDEPENDENT_AMBULATORY_CARE_PROVIDER_SITE_OTHER): Payer: Medicare Other | Admitting: Family Medicine

## 2021-10-04 ENCOUNTER — Ambulatory Visit (INDEPENDENT_AMBULATORY_CARE_PROVIDER_SITE_OTHER): Payer: Medicare Other | Admitting: Cardiology

## 2021-10-04 ENCOUNTER — Ambulatory Visit (HOSPITAL_BASED_OUTPATIENT_CLINIC_OR_DEPARTMENT_OTHER)
Admission: RE | Admit: 2021-10-04 | Discharge: 2021-10-04 | Disposition: A | Discharge status: Home / Self Care | Payer: Medicare Other | Source: Ambulatory Visit | Attending: Cardiology | Admitting: Cardiology

## 2021-10-04 ENCOUNTER — Encounter: Payer: Self-pay | Admitting: Cardiology

## 2021-10-04 VITALS — BP 136/64 | HR 46 | Ht 69.0 in | Wt 162.4 lb

## 2021-10-04 VITALS — BP 132/60 | HR 74 | Temp 98.0°F | Resp 18 | Ht 69.0 in | Wt 162.8 lb

## 2021-10-04 DIAGNOSIS — E785 Hyperlipidemia, unspecified: Secondary | ICD-10-CM

## 2021-10-04 DIAGNOSIS — E119 Type 2 diabetes mellitus without complications: Secondary | ICD-10-CM | POA: Diagnosis not present

## 2021-10-04 DIAGNOSIS — I2510 Atherosclerotic heart disease of native coronary artery without angina pectoris: Secondary | ICD-10-CM | POA: Diagnosis not present

## 2021-10-04 DIAGNOSIS — N1832 Chronic kidney disease, stage 3b: Secondary | ICD-10-CM

## 2021-10-04 DIAGNOSIS — I10 Essential (primary) hypertension: Secondary | ICD-10-CM | POA: Diagnosis not present

## 2021-10-04 DIAGNOSIS — Z951 Presence of aortocoronary bypass graft: Secondary | ICD-10-CM | POA: Diagnosis not present

## 2021-10-04 DIAGNOSIS — I480 Paroxysmal atrial fibrillation: Secondary | ICD-10-CM | POA: Insufficient documentation

## 2021-10-04 DIAGNOSIS — J9811 Atelectasis: Secondary | ICD-10-CM | POA: Diagnosis not present

## 2021-10-04 DIAGNOSIS — I2583 Coronary atherosclerosis due to lipid rich plaque: Secondary | ICD-10-CM | POA: Diagnosis not present

## 2021-10-04 DIAGNOSIS — Z952 Presence of prosthetic heart valve: Secondary | ICD-10-CM | POA: Diagnosis not present

## 2021-10-04 NOTE — Patient Instructions (Signed)
Medication Instructions:   STOP SPIRONOLACTONE  *If you need a refill on your cardiac medications before your next appointment, please call your pharmacy*   Lab Work:  Your physician recommends that you return for lab work in: [place]  If you have labs (blood work) drawn today and your tests are completely normal, you will receive your results only by: [place] (if you have MyChart) OR A paper copy in the mail If you have any lab test that is abnormal or we need to change your treatment, we will call you to review the results.   Testing/Procedures:  A chest x-ray takes a picture of the organs and structures inside the chest, including the heart, lungs, and blood vessels. This test can show several things, including, whether the heart is enlarges; whether fluid is building up in the lungs; and whether pacemaker / defibrillator leads are still in place. [place] [facility]   Follow-Up: At [facility], you and your health needs are our priority.  As part of our continuing mission to provide you with exceptional heart care, we have created designated Provider Care Teams.  These Care Teams include your primary Cardiologist (physician) and Advanced Practice Providers (APPs -  Physician Assistants and Nurse Practitioners) who all work together to provide you with the care you need, when you need it.  We recommend signing up for the patient portal called "MyChart".  Sign up information is provided on this After Visit Summary.  MyChart is used to connect with patients for Virtual Visits (Telemedicine).  Patients are able to view lab/test results, encounter notes, upcoming appointments, etc.  Non-urgent messages can be sent to your provider as well.   To learn more about what you can do with MyChart, go to [email or web address].    Your next appointment:   6 month(s)  The format for your next appointment:   In Person  Provider:   Kirk Ruths MD

## 2021-10-11 DIAGNOSIS — I480 Paroxysmal atrial fibrillation: Secondary | ICD-10-CM | POA: Diagnosis not present

## 2021-10-12 LAB — BASIC METABOLIC PANEL
BUN/Creatinine Ratio: 26 — ABNORMAL HIGH (ref 10–24)
BUN: 46 mg/dL — ABNORMAL HIGH (ref 8–27)
CO2: 22 mmol/L (ref 20–29)
Calcium: 8.9 mg/dL (ref 8.6–10.2)
Chloride: 102 mmol/L (ref 96–106)
Creatinine, Ser: 1.77 mg/dL — ABNORMAL HIGH (ref 0.76–1.27)
Glucose: 194 mg/dL — ABNORMAL HIGH (ref 70–99)
Potassium: 4.9 mmol/L (ref 3.5–5.2)
Sodium: 138 mmol/L (ref 134–144)
eGFR: 37 mL/min/{1.73_m2} — ABNORMAL LOW (ref >59)

## 2021-10-12 LAB — TSH: TSH: 4.53 u[IU]/mL — ABNORMAL HIGH (ref 0.450–4.500)

## 2021-10-16 ENCOUNTER — Telehealth: Payer: Self-pay | Admitting: *Deleted

## 2021-10-16 ENCOUNTER — Encounter: Payer: Self-pay | Admitting: *Deleted

## 2021-10-16 DIAGNOSIS — R609 Edema, unspecified: Secondary | ICD-10-CM

## 2021-10-16 NOTE — Telephone Encounter (Signed)
This encounter was created in error - please disregard.

## 2021-10-16 NOTE — Telephone Encounter (Signed)
-----   Message from Lelon Perla, MD sent at 10/12/2021  6:20 AM EST ----- DC spironolactone; bmet 2 weeks Kirk Ruths

## 2021-10-16 NOTE — Telephone Encounter (Signed)
Spoke with pt, he had been off of the spironolactone and he reports he started feeling bad and his feet were swelling. He restarted the spironolactone 25 mg today. Will make dr [name] aware.

## 2021-10-17 MED ORDER — FUROSEMIDE 20 MG PO TABS: ORAL_TABLET | ORAL | 3 refills | Status: DC

## 2021-10-17 NOTE — Telephone Encounter (Signed)
Spoke with pt, Aware of dr Jacalyn Lefevre recommendations. New script sent to the pharmacy at the [facility]. Lab orders mailed to the pt

## 2021-10-23 ENCOUNTER — Encounter: Payer: Self-pay | Admitting: Cardiology

## 2021-10-24 ENCOUNTER — Telehealth: Payer: Self-pay | Admitting: *Deleted

## 2021-10-24 NOTE — Chronic Care Management (AMB) (Signed)
  Care Management   Note  10/24/2021 Name: Peter Scott MRN: [id number] DOB: Apr 07, 1932  Peter Scott is a 87 y.o. year old male who is a primary care patient of Copland, Gay Filler, MD and is actively engaged with the care management team. I reached out to [name] by phone today to assist with re-scheduling a follow up visit with the Pharmacist  Follow up plan: Patient declines further follow up and engagement by the care management team. Appropriate care team members and provider have been notified via electronic communication.   Julian Hy, [place] Management  Direct Dial: [phone]

## 2021-11-01 DIAGNOSIS — R609 Edema, unspecified: Secondary | ICD-10-CM | POA: Diagnosis not present

## 2021-11-02 LAB — BASIC METABOLIC PANEL WITH GFR
BUN/Creatinine Ratio: 18 (ref 10–24)
BUN: 31 mg/dL — ABNORMAL HIGH (ref 8–27)
CO2: 23 mmol/L (ref 20–29)
Calcium: 8.7 mg/dL (ref 8.6–10.2)
Chloride: 102 mmol/L (ref 96–106)
Creatinine, Ser: 1.68 mg/dL — ABNORMAL HIGH (ref 0.76–1.27)
Glucose: 160 mg/dL — ABNORMAL HIGH (ref 70–99)
Potassium: 5 mmol/L (ref 3.5–5.2)
Sodium: 139 mmol/L (ref 134–144)
eGFR: 39 mL/min/1.73 — ABNORMAL LOW (ref >59)

## 2021-11-05 ENCOUNTER — Other Ambulatory Visit: Payer: Self-pay

## 2021-11-05 ENCOUNTER — Encounter (HOSPITAL_BASED_OUTPATIENT_CLINIC_OR_DEPARTMENT_OTHER): Payer: Self-pay | Admitting: *Deleted

## 2021-11-05 ENCOUNTER — Encounter: Payer: Self-pay | Admitting: Family Medicine

## 2021-11-05 ENCOUNTER — Emergency Department (HOSPITAL_BASED_OUTPATIENT_CLINIC_OR_DEPARTMENT_OTHER)
Admission: EM | Admit: 2021-11-05 | Discharge: 2021-11-05 | Disposition: A | Discharge status: Home / Self Care | Payer: Medicare Other | Attending: Emergency Medicine | Admitting: Emergency Medicine

## 2021-11-05 ENCOUNTER — Emergency Department (HOSPITAL_BASED_OUTPATIENT_CLINIC_OR_DEPARTMENT_OTHER): Payer: Medicare Other

## 2021-11-05 DIAGNOSIS — Z79899 Other long term (current) drug therapy: Secondary | ICD-10-CM | POA: Diagnosis not present

## 2021-11-05 DIAGNOSIS — Z7901 Long term (current) use of anticoagulants: Secondary | ICD-10-CM | POA: Insufficient documentation

## 2021-11-05 DIAGNOSIS — R600 Localized edema: Secondary | ICD-10-CM | POA: Insufficient documentation

## 2021-11-05 DIAGNOSIS — R0602 Shortness of breath: Secondary | ICD-10-CM | POA: Diagnosis not present

## 2021-11-05 DIAGNOSIS — R002 Palpitations: Secondary | ICD-10-CM | POA: Diagnosis not present

## 2021-11-05 DIAGNOSIS — E119 Type 2 diabetes mellitus without complications: Secondary | ICD-10-CM | POA: Diagnosis not present

## 2021-11-05 DIAGNOSIS — Z87891 Personal history of nicotine dependence: Secondary | ICD-10-CM | POA: Diagnosis not present

## 2021-11-05 DIAGNOSIS — Z794 Long term (current) use of insulin: Secondary | ICD-10-CM | POA: Insufficient documentation

## 2021-11-05 DIAGNOSIS — Z20822 Contact with and (suspected) exposure to covid-19: Secondary | ICD-10-CM | POA: Diagnosis not present

## 2021-11-05 DIAGNOSIS — I2510 Atherosclerotic heart disease of native coronary artery without angina pectoris: Secondary | ICD-10-CM | POA: Diagnosis not present

## 2021-11-05 DIAGNOSIS — I455 Other specified heart block: Secondary | ICD-10-CM | POA: Insufficient documentation

## 2021-11-05 DIAGNOSIS — I495 Sick sinus syndrome: Secondary | ICD-10-CM | POA: Diagnosis not present

## 2021-11-05 DIAGNOSIS — I4891 Unspecified atrial fibrillation: Secondary | ICD-10-CM | POA: Diagnosis not present

## 2021-11-05 LAB — COMPREHENSIVE METABOLIC PANEL
ALT: 43 U/L (ref 0–44)
AST: 31 U/L (ref 15–41)
Albumin: 3.3 g/dL — ABNORMAL LOW (ref 3.5–5.0)
Alkaline Phosphatase: 50 U/L (ref 38–126)
Anion gap: 6 (ref 5–15)
BUN: 31 mg/dL — ABNORMAL HIGH (ref 8–23)
CO2: 25 mmol/L (ref 22–32)
Calcium: 8.8 mg/dL — ABNORMAL LOW (ref 8.9–10.3)
Chloride: 104 mmol/L (ref 98–111)
Creatinine, Ser: 1.47 mg/dL — ABNORMAL HIGH (ref 0.61–1.24)
GFR, Estimated: 46 mL/min — ABNORMAL LOW (ref >60)
Glucose, Bld: 234 mg/dL — ABNORMAL HIGH (ref 70–99)
Potassium: 4.1 mmol/L (ref 3.5–5.1)
Sodium: 135 mmol/L (ref 135–145)
Total Bilirubin: 0.5 mg/dL (ref 0.3–1.2)
Total Protein: 7 g/dL (ref 6.5–8.1)

## 2021-11-05 LAB — CBC WITH DIFFERENTIAL/PLATELET
Abs Immature Granulocytes: 0.09 10*3/uL — ABNORMAL HIGH (ref 0.00–0.07)
Basophils Absolute: 0 10*3/uL (ref 0.0–0.1)
Basophils Relative: 1 %
Eosinophils Absolute: 0.1 10*3/uL (ref 0.0–0.5)
Eosinophils Relative: 1 %
HCT: 36.1 % — ABNORMAL LOW (ref 39.0–52.0)
Hemoglobin: 12 g/dL — ABNORMAL LOW (ref 13.0–17.0)
Immature Granulocytes: 1 %
Lymphocytes Relative: 7 %
Lymphs Abs: 0.4 10*3/uL — ABNORMAL LOW (ref 0.7–4.0)
MCH: 32.6 pg (ref 26.0–34.0)
MCHC: 33.2 g/dL (ref 30.0–36.0)
MCV: 98.1 fL (ref 80.0–100.0)
Monocytes Absolute: 0.7 10*3/uL (ref 0.1–1.0)
Monocytes Relative: 10 %
Neutro Abs: 5.3 10*3/uL (ref 1.7–7.7)
Neutrophils Relative %: 80 %
Platelets: 183 10*3/uL (ref 150–400)
RBC: 3.68 MIL/uL — ABNORMAL LOW (ref 4.22–5.81)
RDW: 13.6 % (ref 11.5–15.5)
WBC: 6.6 10*3/uL (ref 4.0–10.5)
nRBC: 0 % (ref 0.0–0.2)

## 2021-11-05 LAB — RESP PANEL BY RT-PCR (FLU A&B, COVID) ARPGX2
Influenza A by PCR: NEGATIVE
Influenza B by PCR: NEGATIVE
SARS Coronavirus 2 by RT PCR: NEGATIVE

## 2021-11-05 LAB — TROPONIN I (HIGH SENSITIVITY)
Troponin I (High Sensitivity): 17 ng/L (ref <18)
Troponin I (High Sensitivity): 17 ng/L (ref <18)

## 2021-11-05 LAB — BRAIN NATRIURETIC PEPTIDE: B Natriuretic Peptide: 263.1 pg/mL — ABNORMAL HIGH (ref 0.0–100.0)

## 2021-11-05 NOTE — ED Notes (Signed)
Immediately placed in exam room with 12 lead ECG completed, placed on cont cardiac monitoring with cont POX

## 2021-11-05 NOTE — ED Provider Notes (Signed)
[facility] Provider Note   CSN: [id number] Arrival date & time: 11/05/21  6468     History Chief Complaint  Patient presents with   "Heart skipping"    Peter Scott is a 87 y.o. male.  HPI     87 year old male with history of coronary artery disease with history of CABG in 2001, ablation for likely SVT at that time, bilateral carotid stenosis, TAVR May 2019, paroxysmal atrial fibrillation on amiodarone and Eliquis, hypertension, hyperlipidemia who presents with concern for palpitations.   Feels palpitations and heart skipping a beat HR felt fast, skipping a beat This is how it felt before first heart surgery in 2001 Started this morning Used to be able to get it to slow down but not feeling that way now No chest pain Is having some shortness of breath that started this morning Dyspnea not changed by exertion or position  Support stockings, not having leg swelling because wearing these  Chronic cough which is not changed, worse when laying back, certain positions  No lightheadedness or syncope Dr. 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   26 mm Edwards Sapien 3 transcatheter heart valve placed via percutaneous right transfemoral approach    Umbilical hernia     Patient Active Problem List   Diagnosis Date Noted   Paroxysmal atrial fibrillation (HCC)    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   S/P CABG x 4    Severe aortic stenosis    Medicare annual wellness visit, subsequent 10/04/2014   Bruit 09/29/2014   Abdominal aortic aneurysm 07/23/2014   BCC (basal  cell carcinoma) 07/23/2014   SCC (squamous cell carcinoma), arm 07/23/2014   Essential hypertension, benign 07/08/2014   Coronary artery disease 07/08/2014   Hyperlipidemia LDL goal <100 07/08/2014   Type II diabetes mellitus, well controlled ([place]) 07/08/2014   Colon cancer screening 07/08/2014    Past Surgical History:  Procedure Laterality Date   ABLATION     Rhythm problem; rhythm unknown; [place]   CARDIOVERSION N/A 07/14/2018   Procedure: CARDIOVERSION;  Surgeon: Dorothy Spark, MD;  Location: [place];  Service: Cardiovascular;  Laterality: N/A;   CARDIOVERSION N/A 08/27/2018   Procedure: CARDIOVERSION;  Surgeon: Jerline Pain, MD;  Location: [facility];  Service: Cardiovascular;  Laterality: N/A;   CORONARY ARTERY BYPASS GRAFT  2001   EXPLORATION POST OPERATIVE OPEN HEART  2001, June   INTRAOPERATIVE TRANSTHORACIC ECHOCARDIOGRAM N/A 04/01/2018   Procedure: INTRAOPERATIVE TRANSTHORACIC ECHOCARDIOGRAM;  Surgeon: Burnell Blanks, MD;  Location: [place];  Service: Open Heart Surgery;  Laterality: N/A;   RIGHT/LEFT HEART CATH AND CORONARY/GRAFT ANGIOGRAPHY N/A 01/31/2018   Procedure: RIGHT/LEFT HEART CATH AND CORONARY/GRAFT ANGIOGRAPHY;  Surgeon: Burnell Blanks, MD;  Location: [facility];  Service: Cardiovascular;  Laterality: N/A;   TEE WITHOUT CARDIOVERSION N/A 01/17/2018   Procedure: TRANSESOPHAGEAL ECHOCARDIOGRAM (TEE);  Surgeon: Lelon Perla, MD;  Location: [facility];  Service: Cardiovascular;  Laterality: N/A;   TEE WITHOUT CARDIOVERSION N/A 07/14/2018   Procedure:  TRANSESOPHAGEAL ECHOCARDIOGRAM (TEE);  Surgeon: Dorothy Spark, MD;  Location: [place];  Service: Cardiovascular;  Laterality: N/A;   TONSILLECTOMY     TRANSCATHETER AORTIC VALVE REPLACEMENT, TRANSFEMORAL N/A 04/01/2018   Procedure: TRANSCATHETER AORTIC VALVE REPLACEMENT, TRANSFEMORAL;  Surgeon: Burnell Blanks, MD;  Location: [place];  Service: Open Heart  Surgery;  Laterality: N/A;   UMBILICAL HERNIA REPAIR  2015       Family History  Problem Relation Age of Onset   Diabetes Mother 82       Deceased   Heart disease Father 37       Deceased   Heart disease Maternal Uncle    Diabetes Maternal Uncle    Heart disease Brother    Diabetes Brother    Diabetes Sister        #1   Heart disease Sister        #2   Hyperlipidemia Son        x2    Social History   Tobacco Use   Smoking status: Former    Packs/day: 1.00    Years: 3.00    Pack years: 3.00    Types: Cigarettes   Smokeless tobacco: Never   Tobacco comments:    quit tobbaco in his 61s  Vaping Use   Vaping Use: Never used  Substance Use Topics   Alcohol use: No    Alcohol/week: 0.0 standard drinks   Drug use: No    Home Medications Prior to Admission medications   Medication Sig Start Date End Date Taking? Authorizing Provider  acetaminophen (TYLENOL) 500 MG tablet Take 1 tablet (500 mg total) by mouth every 6 (six) hours as needed. Patient taking differently: Take 500 mg by mouth every 6 (six) hours as needed for moderate pain or headache. 04/02/18   Nani Skillern, PA-C  amiodarone (PACERONE) 200 MG tablet Take 1 tablet (200 mg total) by mouth daily. 08/13/18   Lelon Perla, MD  amLODipine (NORVASC) 5 MG tablet Take 1 tablet (5 mg total) by mouth daily. 03/09/19   Eileen Stanford, PA-C  amoxicillin (AMOXIL) 500 MG capsule TAKE 4 CAPSULES BY MOUTH AS DIRECTED ONE HOUR PRIOR TO DENTAL VISITS 02/06/21 02/06/22  Eileen Stanford, PA-C  apixaban (ELIQUIS) 2.5 MG TABS tablet Take 1 tablet (2.5 mg total) by mouth 2 (two) times daily. 01/28/20   Lelon Perla, MD  atorvastatin (LIPITOR) 40 MG tablet Take 40 mg by mouth daily.    [provider]  Blood Glucose Monitoring Suppl (CONTOUR NEXT EZ) w/Device KIT 1 kit by Does not apply route daily. Use once a day to check blood sugar.  Dx code: E11.9 09/03/19   Copland, Gay Filler, MD  Cholecalciferol  25 MCG (1000 UT) tablet TAKE ONE TABLET BY MOUTH DAILY FOR LOW VITAMIN D LEVEL 06/29/21   [provider]  COVID-19 mRNA bivalent vaccine, Moderna, (MODERNA COVID-19 BIVAL BOOSTER) 50 MCG/0.5ML injection Inject into the muscle. Patient not taking: Reported on 10/04/2021 08/18/21   Carlyle Basques, MD  furosemide (LASIX) 20 MG tablet One tablet daily as needed for swelling 10/17/21   Lelon Perla, MD  glucose blood (CONTOUR NEXT TEST) test strip Use once a day to check blood sugar.  Dx code: E11.9 02/08/21   Copland, Gay Filler, MD  insulin glargine (LANTUS) 100 UNIT/ML injection Inject 14 Units into the skin at bedtime.    [provider]  insulin glargine-yfgn (SEMGLEE) 100 UNIT/ML Pen INJECT 20 UNITS SUBCUTANEOUSLY AT  BEDTIME (USE WITHIN 28 DAYS AFTER OPENING PEN) 08/21/21   [provider]  Insulin Pen Needle (BD PEN NEEDLE NANO U/F) 32G X 4 MM MISC by Does not apply route. 07/10/21   [provider]  losartan (COZAAR) 100 MG tablet Take 0.5 tablets (50 mg total) by mouth 2 (two) times daily. 08/05/18   Copland, Gwenlyn Found, MD    Allergies    Glipizide  Review of Systems   Review of Systems  Constitutional:  Negative for fever. Fatigue: no change. HENT:  Negative for sore throat.   Eyes:  Negative for visual disturbance.  Respiratory:  Positive for cough and shortness of breath.   Cardiovascular:  Negative for chest pain and leg swelling.  Gastrointestinal:  Negative for abdominal pain, diarrhea, nausea and vomiting.  Genitourinary:  Negative for difficulty urinating.  Musculoskeletal:  Negative for back pain and neck stiffness.  Skin:  Negative for rash.  Neurological:  Negative for syncope, light-headedness and headaches.   Physical Exam Updated Vital Signs BP (!) 163/72    Pulse 90    Temp 97.8 F (36.6 C) (Oral)    Resp 20    SpO2 94%   Physical Exam Vitals and nursing note reviewed.  Constitutional:      General: He is not in acute  distress.    Appearance: He is well-developed. He is not diaphoretic.  HENT:     Head: Normocephalic and atraumatic.  Eyes:     Conjunctiva/sclera: Conjunctivae normal.  Cardiovascular:     Rate and Rhythm: Normal rate and regular rhythm.     Heart sounds: Normal heart sounds. No murmur heard.   No friction rub. No gallop.  Pulmonary:     Effort: Pulmonary effort is normal. No respiratory distress.     Breath sounds: Normal breath sounds. No wheezing or rales.  Abdominal:     General: There is no distension.     Palpations: Abdomen is soft.     Tenderness: There is no abdominal tenderness. There is no guarding.  Musculoskeletal:     Cervical back: Normal range of motion.  Skin:    General: Skin is warm and dry.  Neurological:     Mental Status: He is alert and oriented to person, place, and time.    ED Results / Procedures / Treatments   Labs (all labs ordered are listed, but only abnormal results are displayed) Labs Reviewed  CBC WITH DIFFERENTIAL/PLATELET - Abnormal; Notable for the following components:      Result Value   RBC 3.68 (*)    Hemoglobin 12.0 (*)    HCT 36.1 (*)    Lymphs Abs 0.4 (*)    Abs Immature Granulocytes 0.09 (*)    All other components within normal limits  COMPREHENSIVE METABOLIC PANEL - Abnormal; Notable for the following components:   Glucose, Bld 234 (*)    BUN 31 (*)    Creatinine, Ser 1.47 (*)    Calcium 8.8 (*)    Albumin 3.3 (*)    GFR, Estimated 46 (*)    All other components within normal limits  BRAIN NATRIURETIC PEPTIDE - Abnormal; Notable for the following components:   B Natriuretic Peptide 263.1 (*)    All other components within normal limits  RESP PANEL BY RT-PCR (FLU A&B, COVID) ARPGX2  TROPONIN I (HIGH SENSITIVITY)  TROPONIN I (HIGH SENSITIVITY)    EKG EKG Interpretation  Date/Time:  Sunday November 05 2021 09:22:35 EST Ventricular Rate:  93 PR Interval:  227 QRS Duration: 98 QT Interval:  409 QTC  Calculation: 509 R Axis:   51 Text Interpretation: Sinus or ectopic atrial rhythm Atrial premature complex Sinus pause Prolonged PR interval Borderline repolarization abnormality Prolonged QT interval Since prior ECG, sinus pauses are new Confirmed by Gareth Morgan [phone]) on 11/05/2021 9:34:35 AM  Radiology DG Chest Portable 1 View  Result Date: 11/05/2021 CLINICAL DATA:  Shortness of breath. EXAM: PORTABLE CHEST 1 VIEW COMPARISON:  10/04/2021 and older studies. FINDINGS: Stable changes from prior cardiac surgery and aortic valve replacement. Cardiac silhouette is normal in size. No mediastinal or hilar masses. Lung volumes are low. Left lung base not well visualized due to the superimposed cardiac silhouette and semi-erect positioning. Allowing for this, the lungs are clear. No convincing pleural effusion and no pneumothorax. Skeletal structures are grossly intact. IMPRESSION: No acute cardiopulmonary disease. Electronically Signed   By: Lajean Manes M.D.   On: 11/05/2021 09:58    Procedures Procedures   Medications Ordered in ED Medications - No data to display  ED Course  I have reviewed the triage vital signs and the nursing notes.  Pertinent labs & imaging results that were available during my care of the patient were reviewed by me and considered in my medical decision making (see chart for details).    MDM Rules/Calculators/A&P                          87 year old male with history of coronary artery disease with history of CABG in 2001, ablation for likely SVT at that time, bilateral carotid stenosis, TAVR May 2019, paroxysmal atrial fibrillation on amiodarone and Eliquis, hypertension, hyperlipidemia who presents with concern for palpitations.  CXR without convincing pleural effusion, no obvious pneumonia, pulmonary edema.  Describes sensation of palpitations but not chest pain--delta troponins were both negative and have low suspicion for ACS.  BNP mildly elevated at 260,  however do not see any other signs of congestive heart failure on exam, and do not feel this is a significant elevation in setting of clinical presentation.  EKG was completed which showed a sinus rhythm with sinus pauses.  Discussed with Dr. Acie Fredrickson of cardiology, who given the short duration of these sinus pauses being less than 1.4 seconds, no syncope, does not feel he needs admission or further emergent evaluation for this and may follow up with Dr. [name] Cardiology as an outpatient.  Discussed with Mr. Ratterman that some of his palpitations may be due to the these episodes of sinus pause, however he also reports having racing heart rate prior to coming to the emergency department, and question whether this represented episode of paroxysmal atrial fibrillation.  Recommend close follow-up with his cardiologist Dr. [name].  He is hemodynamically stable, feeling back to baseline and recommend outpatient evaluation. Patient discharged in stable condition with understanding of reasons to return.       Final Clinical Impression(s) / ED Diagnoses Final diagnoses:  Palpitations  Sinus pause    Rx / DC Orders ED Discharge Orders     None        Gareth Morgan, MD 11/05/21 1425

## 2021-11-05 NOTE — ED Triage Notes (Signed)
Has hx of Afib and TAVR, approx 0700hrs this am felt his heart "skipping" and feels like it is "irregular", denies chest pain, but does demonstrate shortness of breath, worse with exertion.

## 2021-11-06 ENCOUNTER — Encounter: Payer: Self-pay | Admitting: Cardiology

## 2021-11-08 ENCOUNTER — Encounter: Payer: Self-pay | Admitting: Family Medicine

## 2021-11-23 ENCOUNTER — Encounter: Payer: Self-pay | Admitting: Family Medicine

## 2021-11-23 DIAGNOSIS — E119 Type 2 diabetes mellitus without complications: Secondary | ICD-10-CM

## 2021-11-27 DIAGNOSIS — L814 Other melanin hyperpigmentation: Secondary | ICD-10-CM | POA: Diagnosis not present

## 2021-11-27 DIAGNOSIS — L570 Actinic keratosis: Secondary | ICD-10-CM | POA: Diagnosis not present

## 2021-11-27 DIAGNOSIS — K1321 Leukoplakia of oral mucosa, including tongue: Secondary | ICD-10-CM | POA: Diagnosis not present

## 2021-11-27 DIAGNOSIS — X32XXXS Exposure to sunlight, sequela: Secondary | ICD-10-CM | POA: Diagnosis not present

## 2021-11-27 DIAGNOSIS — Z85828 Personal history of other malignant neoplasm of skin: Secondary | ICD-10-CM | POA: Diagnosis not present

## 2021-12-18 DIAGNOSIS — E785 Hyperlipidemia, unspecified: Secondary | ICD-10-CM | POA: Diagnosis not present

## 2021-12-18 DIAGNOSIS — Z794 Long term (current) use of insulin: Secondary | ICD-10-CM | POA: Diagnosis not present

## 2021-12-18 DIAGNOSIS — E1169 Type 2 diabetes mellitus with other specified complication: Secondary | ICD-10-CM | POA: Diagnosis not present

## 2021-12-18 DIAGNOSIS — E1122 Type 2 diabetes mellitus with diabetic chronic kidney disease: Secondary | ICD-10-CM | POA: Diagnosis not present

## 2021-12-18 DIAGNOSIS — N1832 Chronic kidney disease, stage 3b: Secondary | ICD-10-CM | POA: Diagnosis not present

## 2021-12-18 DIAGNOSIS — I129 Hypertensive chronic kidney disease with stage 1 through stage 4 chronic kidney disease, or unspecified chronic kidney disease: Secondary | ICD-10-CM | POA: Diagnosis not present

## 2021-12-18 LAB — HEMOGLOBIN A1C: Hemoglobin A1C: 7.3

## 2021-12-27 ENCOUNTER — Encounter (HOSPITAL_BASED_OUTPATIENT_CLINIC_OR_DEPARTMENT_OTHER): Payer: Self-pay

## 2021-12-27 ENCOUNTER — Emergency Department (HOSPITAL_BASED_OUTPATIENT_CLINIC_OR_DEPARTMENT_OTHER)
Admission: EM | Admit: 2021-12-27 | Discharge: 2021-12-27 | Disposition: A | Discharge status: Home / Self Care | Payer: Medicare Other | Attending: Emergency Medicine | Admitting: Emergency Medicine

## 2021-12-27 ENCOUNTER — Other Ambulatory Visit: Payer: Self-pay

## 2021-12-27 DIAGNOSIS — Z794 Long term (current) use of insulin: Secondary | ICD-10-CM | POA: Insufficient documentation

## 2021-12-27 DIAGNOSIS — R040 Epistaxis: Secondary | ICD-10-CM | POA: Diagnosis not present

## 2021-12-27 DIAGNOSIS — Z79899 Other long term (current) drug therapy: Secondary | ICD-10-CM | POA: Insufficient documentation

## 2021-12-27 DIAGNOSIS — Z7901 Long term (current) use of anticoagulants: Secondary | ICD-10-CM | POA: Insufficient documentation

## 2021-12-27 MED ORDER — LIDOCAINE-EPINEPHRINE-TETRACAINE (LET) TOPICAL GEL
3.0000 mL | Freq: Once | TOPICAL | Status: AC
  Administered 2021-12-27: 3 mL via TOPICAL
  Filled 2021-12-27: qty 3

## 2021-12-27 NOTE — ED Triage Notes (Signed)
Pt c/o nosebleed x 1.5 hrs-states he does take eloquis

## 2021-12-27 NOTE — ED Notes (Signed)
ED Provider at bedside. 

## 2021-12-27 NOTE — ED Provider Notes (Signed)
[place] [facility] Provider Note   CSN: [id number] Arrival date & time: 12/27/21  1447     History  Chief Complaint  Patient presents with   Epistaxis    Peter Scott is a 88 y.o. male.  Patient presents to the emergency department for evaluation of nosebleed.  Patient reports that he blew his nose about 1-1/2 hours before coming to the ER and the left side of his nose started bleeding.  It has been continuously bleeding since.  Patient does take Eliquis because of a previous valve replacement.  He has been trying to apply direct pressure since the bleeding started but it will not stop.  He denies previous history of nosebleeds and has not had any trauma.      Home Medications Prior to Admission medications   Medication Sig Start Date End Date Taking? Authorizing Provider  acetaminophen (TYLENOL) 500 MG tablet Take 1 tablet (500 mg total) by mouth every 6 (six) hours as needed. Patient taking differently: Take 500 mg by mouth every 6 (six) hours as needed for moderate pain or headache. 04/02/18   Nani Skillern, PA-C  amiodarone (PACERONE) 200 MG tablet Take 1 tablet (200 mg total) by mouth daily. 08/13/18   Lelon Perla, MD  amLODipine (NORVASC) 5 MG tablet Take 1 tablet (5 mg total) by mouth daily. 03/09/19   Eileen Stanford, PA-C  amoxicillin (AMOXIL) 500 MG capsule TAKE 4 CAPSULES BY MOUTH AS DIRECTED ONE HOUR PRIOR TO DENTAL VISITS 02/06/21 02/06/22  Eileen Stanford, PA-C  apixaban (ELIQUIS) 2.5 MG TABS tablet Take 1 tablet (2.5 mg total) by mouth 2 (two) times daily. 01/28/20   Lelon Perla, MD  atorvastatin (LIPITOR) 40 MG tablet Take 40 mg by mouth daily.    [provider]  Blood Glucose Monitoring Suppl (CONTOUR NEXT EZ) w/Device KIT 1 kit by Does not apply route daily. Use once a day to check blood sugar.  Dx code: E11.9 09/03/19   Copland, Gay Filler, MD  Cholecalciferol 25 MCG (1000 UT) tablet TAKE ONE TABLET BY MOUTH  DAILY FOR LOW VITAMIN D LEVEL 06/29/21   [provider]  COVID-19 mRNA bivalent vaccine, Moderna, (MODERNA COVID-19 BIVAL BOOSTER) 50 MCG/0.5ML injection Inject into the muscle. Patient not taking: Reported on 10/04/2021 08/18/21   Carlyle Basques, MD  furosemide (LASIX) 20 MG tablet One tablet daily as needed for swelling 10/17/21   Lelon Perla, MD  glucose blood (CONTOUR NEXT TEST) test strip Use once a day to check blood sugar.  Dx code: E11.9 02/08/21   Copland, Gay Filler, MD  insulin glargine (LANTUS) 100 UNIT/ML injection Inject 14 Units into the skin at bedtime.    [provider]  insulin glargine-yfgn (SEMGLEE) 100 UNIT/ML Pen INJECT 20 UNITS SUBCUTANEOUSLY AT BEDTIME (USE WITHIN 28 DAYS AFTER OPENING PEN) 08/21/21   [provider]  Insulin Pen Needle (BD PEN NEEDLE NANO U/F) 32G X 4 MM MISC by Does not apply route. 07/10/21   [provider]  losartan (COZAAR) 100 MG tablet Take 0.5 tablets (50 mg total) by mouth 2 (two) times daily. 08/05/18   Copland, Gay Filler, MD      Allergies    Glipizide    Review of Systems   Review of Systems  HENT:  Positive for nosebleeds.    Physical Exam Updated Vital Signs BP (!) 164/66    Pulse 64    Temp 97.8 F (36.6 C)  Resp 16    SpO2 92%  Physical Exam Vitals and nursing note reviewed.  Constitutional:      General: He is not in acute distress.    Appearance: He is well-developed.  HENT:     Head: Normocephalic and atraumatic.     Nose:     Left Nostril: Epistaxis present.  Eyes:     Conjunctiva/sclera: Conjunctivae normal.  Cardiovascular:     Rate and Rhythm: Normal rate and regular rhythm.     Heart sounds: No murmur heard. Pulmonary:     Effort: Pulmonary effort is normal. No respiratory distress.     Breath sounds: Normal breath sounds.  Abdominal:     Palpations: Abdomen is soft.     Tenderness: There is no abdominal tenderness.  Musculoskeletal:        General: No swelling.      Cervical back: Neck supple.  Skin:    General: Skin is warm and dry.     Capillary Refill: Capillary refill takes less than 2 seconds.  Neurological:     Mental Status: He is alert.  Psychiatric:        Mood and Affect: Mood normal.    ED Results / Procedures / Treatments   Labs (all labs ordered are listed, but only abnormal results are displayed) Labs Reviewed - No data to display  EKG None  Radiology No results found.  Procedures .Epistaxis Management  Date/Time: 12/27/2021 6:22 PM Performed by: Orpah Greek, MD Authorized by: Orpah Greek, MD   Consent:    Consent obtained:  Verbal   Consent given by:  Patient   Risks, benefits, and alternatives were discussed: yes     Risks discussed:  Bleeding, infection and pain Universal protocol:    Procedure explained and questions answered to patient or proxy's satisfaction: yes     Relevant documents present and verified: yes     Test results available: yes     Imaging studies available: yes     Required blood products, implants, devices, and special equipment available: yes     Site/side marked: yes     Immediately prior to procedure, a time out was called: yes     Patient identity confirmed:  Verbally with patient Anesthesia:    Anesthesia method:  Topical application   Topical anesthetic:  LET Procedure details:    Treatment site: Left side, unclear if anterior or posterior.   Treatment method:  Nasal balloon   Treatment episode: initial   Post-procedure details:    Assessment:  Bleeding decreased   Procedure completion:  Tolerated well, no immediate complications Comments:     Initially a 5.5 cm balloon was placed.  Patient had some bleeding around the balloon and down the back of his throat.  It was replaced with a 7.5 cm balloon and bleeding significantly improved.    Medications Ordered in ED Medications  lidocaine-EPINEPHrine-tetracaine (LET) topical gel (3 mLs Topical Given 12/27/21 1516)     ED Course/ Medical Decision Making/ A&P                           Medical Decision Making  Patient presents with bleeding from the left side of his nose.  Patient with a fair amount of bleeding present on upon arrival.  He is on Eliquis.  Reading through his records, he has had a TAVR which would not require anticoagulation.  His Eliquis is prescribed secondary to paroxysmal atrial  fibrillation and he has had a previous ablation.  It is therefore felt safe to stop his Eliquis until packing is removed.  Patient underwent rapid Rhino packing.  Initially the bleeding did not stop with a 5.5 cm balloon, was replaced with a 7.5 cm balloon and has significantly improved.  Patient counseled that he should go to [facility] if he has recurrent bleeding as we cannot provide any further interventions here.  He will stop the Eliquis until packing removed.  He is having his wife try to schedule an appointment with her ENT for him on Monday.  He was given follow-up information for oncall ENT.        Final Clinical Impression(s) / ED Diagnoses Final diagnoses:  Epistaxis    Rx / DC Orders ED Discharge Orders     None         Samella Lucchetti, Gwenyth Allegra, MD 12/27/21 1825

## 2021-12-27 NOTE — Discharge Instructions (Signed)
Schedule follow-up with ENT for packing removal on Monday or Tuesday.  If you cannot get an appointment, call Dr. Janace Hoard at [phone] to schedule an appointment.  Do not take your Eliquis until packing removed.

## 2021-12-28 ENCOUNTER — Encounter: Payer: Self-pay | Admitting: Cardiology

## 2022-01-01 DIAGNOSIS — R040 Epistaxis: Secondary | ICD-10-CM | POA: Diagnosis not present

## 2022-01-04 ENCOUNTER — Ambulatory Visit: Payer: Self-pay | Admitting: Pharmacist

## 2022-01-04 NOTE — Chronic Care Management (AMB) (Signed)
Patient previously engaged with Chronic Care Management team. In December 2022 attempt was made to try to rescheduled / re-engage patient with Chronic Care Management service. Patient declined.  Closing out Chronic Care Management case.  Cherre Robins, PharmD Clinical Pharmacist [facility]

## 2022-01-05 DIAGNOSIS — Z20822 Contact with and (suspected) exposure to covid-19: Secondary | ICD-10-CM | POA: Diagnosis not present

## 2022-02-19 NOTE — Progress Notes (Signed)
? ? ? ? ?HPI: FU CAD and AVR. Patient is status post coronary artery bypass graft in [place] in 2001. He also had ablation of what sounds to be SVT at that time. Carotid Dopplers March 2019 showed 1 to 39% bilateral stenosis. Patient had preoperative cardiac catheterization that revealed severe native three-vessel coronary artery disease with patency of grafts.  He underwent TAVR 5/19. Also with h/o PAF. Abd ultrasound 2/21 showed no AAA. Echocardiogram April 2021 showed normal LV function, mild mitral regurgitation, previous TAVR with trivial perivalvular aortic insufficiency.  Contacted the office with increased dyspnea and added to my schedule. Since last seen, over the past 2 months he has noticed increased dyspnea on exertion and cough.  No orthopnea, PND, chest pain or syncope.  He has chronic mild pedal edema. ? ?Current Outpatient Medications  ?Medication Sig Dispense Refill  ? acetaminophen (TYLENOL) 500 MG tablet Take 1 tablet (500 mg total) by mouth every 6 (six) hours as needed. (Patient taking differently: Take 500 mg by mouth every 6 (six) hours as needed for moderate pain or headache.) 30 tablet 0  ? amiodarone (PACERONE) 200 MG tablet Take 1 tablet (200 mg total) by mouth daily. 90 tablet 3  ? amLODipine (NORVASC) 5 MG tablet Take 1 tablet (5 mg total) by mouth daily.    ? apixaban (ELIQUIS) 2.5 MG TABS tablet Take 1 tablet (2.5 mg total) by mouth 2 (two) times daily.    ? atorvastatin (LIPITOR) 40 MG tablet Take 40 mg by mouth daily.    ? Cholecalciferol 25 MCG (1000 UT) tablet TAKE ONE TABLET BY MOUTH DAILY FOR LOW VITAMIN D LEVEL    ? insulin glargine-yfgn (SEMGLEE) 100 UNIT/ML Pen 18 Units.    ? losartan (COZAAR) 100 MG tablet Take 0.5 tablets (50 mg total) by mouth 2 (two) times daily. 90 tablet 1  ? Blood Glucose Monitoring Suppl (CONTOUR NEXT EZ) w/Device KIT 1 kit by Does not apply route daily. Use once a day to check blood sugar.  Dx code: E11.9 1 kit 0  ? COVID-19 mRNA bivalent vaccine,  Moderna, (MODERNA COVID-19 BIVAL BOOSTER) 50 MCG/0.5ML injection Inject into the muscle. (Patient not taking: Reported on 10/04/2021) 0.5 mL 0  ? glucose blood (CONTOUR NEXT TEST) test strip Use once a day to check blood sugar.  Dx code: E11.9 100 each 12  ? Insulin Pen Needle (BD PEN NEEDLE NANO U/F) 32G X 4 MM MISC by Does not apply route.    ? ?No current facility-administered medications for this visit.  ? ? ? ?Past Medical History:  ?Diagnosis Date  ? Aortic aneurysm ([place])   ? Aortic stenosis   ? CAD (coronary artery disease)   ? Diabetes mellitus type II, controlled ([place])   ? Heart disease   ? Ablation  ? History of chicken pox   ? Hyperlipidemia   ? Hypertension   ? S/P CABG x 4   ? 2001 - [place]  ? 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 26 mm Edwards Sapien 3 transcatheter heart valve placed via percutaneous right transfemoral approach   ? Umbilical hernia   ? ? ?Past Surgical History:  ?Procedure Laterality Date  ? ABLATION    ? Rhythm problem; rhythm unknown; [place]  ? CARDIOVERSION N/A 07/14/2018  ? Procedure: CARDIOVERSION;  Surgeon: Dorothy Spark, MD;  Location: [facility] ENDOSCOPY;  Service: Cardiovascular;  Laterality: N/A;  ? CARDIOVERSION N/A 08/27/2018  ? Procedure: CARDIOVERSION;  Surgeon: Jerline Pain,  MD;  Location: [place];  Service: Cardiovascular;  Laterality: N/A;  ? CORONARY ARTERY BYPASS GRAFT  2001  ? EXPLORATION POST OPERATIVE OPEN HEART  2001, June  ? INTRAOPERATIVE TRANSTHORACIC ECHOCARDIOGRAM N/A 04/01/2018  ? Procedure: INTRAOPERATIVE TRANSTHORACIC ECHOCARDIOGRAM;  Surgeon: Burnell Blanks, MD;  Location: [place];  Service: Open Heart Surgery;  Laterality: N/A;  ? RIGHT/LEFT HEART CATH AND CORONARY/GRAFT ANGIOGRAPHY N/A 01/31/2018  ? Procedure: RIGHT/LEFT HEART CATH AND CORONARY/GRAFT ANGIOGRAPHY;  Surgeon: Burnell Blanks, MD;  Location: [place] CV LAB;  Service: Cardiovascular;  Laterality: N/A;  ? TEE WITHOUT CARDIOVERSION N/A  01/17/2018  ? Procedure: TRANSESOPHAGEAL ECHOCARDIOGRAM (TEE);  Surgeon: Lelon Perla, MD;  Location: [facility] ENDOSCOPY;  Service: Cardiovascular;  Laterality: N/A;  ? TEE WITHOUT CARDIOVERSION N/A 07/14/2018  ? Procedure: TRANSESOPHAGEAL ECHOCARDIOGRAM (TEE);  Surgeon: Dorothy Spark, MD;  Location: [place];  Service: Cardiovascular;  Laterality: N/A;  ? TONSILLECTOMY    ? TRANSCATHETER AORTIC VALVE REPLACEMENT, TRANSFEMORAL N/A 04/01/2018  ? Procedure: TRANSCATHETER AORTIC VALVE REPLACEMENT, TRANSFEMORAL;  Surgeon: Burnell Blanks, MD;  Location: [place];  Service: Open Heart Surgery;  Laterality: N/A;  ? UMBILICAL HERNIA REPAIR  2015  ? ? ?Social History  ? ?Socioeconomic History  ? Marital status: Married  ?  Spouse name: Not on file  ? Number of children: 2  ? Years of education: Not on file  ? Highest education level: Not on file  ?Occupational History  ? Not on file  ?Tobacco Use  ? Smoking status: Former  ?  Packs/day: 1.00  ?  Years: 3.00  ?  Pack years: 3.00  ?  Types: Cigarettes  ? Smokeless tobacco: Never  ? Tobacco comments:  ?  quit tobbaco in his 71s  ?Vaping Use  ? Vaping Use: Never used  ?Substance and Sexual Activity  ? Alcohol use: No  ?  Alcohol/week: 0.0 standard drinks  ? Drug use: No  ? Sexual activity: Not on file  ?Other Topics Concern  ? Not on file  ?Social History Narrative  ? Not on file  ? ?Social Determinants of Health  ? ?Financial Resource Strain: Low Risk   ? Difficulty of Paying Living Expenses: Not hard at all  ?Food Insecurity: No Food Insecurity  ? Worried About Charity fundraiser in the Last Year: Never true  ? Ran Out of Food in the Last Year: Never true  ?Transportation Needs: No Transportation Needs  ? Lack of Transportation (Medical): No  ? Lack of Transportation (Non-Medical): No  ?Physical Activity: Insufficiently Active  ? Days of Exercise per Week: 4 days  ? Minutes of Exercise per Session: 30 min  ?Stress: No Stress Concern Present  ? Feeling of Stress :  Not at all  ?Social Connections: Moderately Integrated  ? Frequency of Communication with Friends and Family: More than three times a week  ? Frequency of Social Gatherings with Friends and Family: More than three times a week  ? Attends Religious Services: More than 4 times per year  ? Active Member of Clubs or Organizations: No  ? Attends Archivist Meetings: Never  ? Marital Status: Married  ?Intimate Partner Violence: Not At Risk  ? Fear of Current or Ex-Partner: No  ? Emotionally Abused: No  ? Physically Abused: No  ? Sexually Abused: No  ? ? ?Family History  ?Problem Relation Age of Onset  ? Diabetes Mother 54  ?     Deceased  ? Heart disease Father  41  ?     Deceased  ? Heart disease Maternal Uncle   ? Diabetes Maternal Uncle   ? Heart disease Brother   ? Diabetes Brother   ? Diabetes Sister   ?     #1  ? Heart disease Sister   ?     #2  ? Hyperlipidemia Son   ?     x2  ? ? ?ROS: no fevers or chills, productive cough, hemoptysis, dysphasia, odynophagia, melena, hematochezia, dysuria, hematuria, rash, seizure activity, orthopnea, PND, claudication. Remaining systems are negative. ? ?Physical Exam: ?Well-developed well-nourished in no acute distress.  ?Skin is warm and dry.  ?HEENT is normal.  ?Neck is supple.  ?Chest is clear to auscultation with normal expansion.  ?Cardiovascular exam is regular rate and rhythm.  2/6 systolic murmur left sternal border.  No diastolic murmur. ?Abdominal exam nontender or distended. No masses palpated. ?Extremities show 1+ edema. ?neuro grossly intact ? ?ECG-sinus bradycardia with first-degree AV block, no ST changes.  Personally reviewed ? ?A/P ? ?1 dyspnea-etiology unclear.  We will repeat echocardiogram.  Check PA and lateral chest x-ray.  BNP mildly elevated previously and he has mild lower extremity edema.  Add Lasix 20 mg daily.  In 1 week check potassium, renal function and BNP. ? ?2 paroxysmal atrial fibrillation-patient remains in sinus.  We will continue  amiodarone.  He remains bradycardic.  Continue apixaban.  Repeat chest x-ray given complaints of dyspnea.  Repeat TSH.  We will also check hemoglobin. ?  ?3 hypertension-blood pressure controlled.  Continue

## 2022-02-21 ENCOUNTER — Ambulatory Visit (INDEPENDENT_AMBULATORY_CARE_PROVIDER_SITE_OTHER): Payer: Medicare Other | Admitting: Cardiology

## 2022-02-21 ENCOUNTER — Encounter: Payer: Self-pay | Admitting: Cardiology

## 2022-02-21 ENCOUNTER — Ambulatory Visit (HOSPITAL_BASED_OUTPATIENT_CLINIC_OR_DEPARTMENT_OTHER)
Admission: RE | Admit: 2022-02-21 | Discharge: 2022-02-21 | Disposition: A | Discharge status: Home / Self Care | Payer: Medicare Other | Source: Ambulatory Visit | Attending: Cardiology | Admitting: Cardiology

## 2022-02-21 ENCOUNTER — Other Ambulatory Visit (HOSPITAL_BASED_OUTPATIENT_CLINIC_OR_DEPARTMENT_OTHER): Payer: Self-pay

## 2022-02-21 VITALS — BP 150/64 | HR 54 | Ht 69.0 in | Wt 158.0 lb

## 2022-02-21 DIAGNOSIS — I480 Paroxysmal atrial fibrillation: Secondary | ICD-10-CM

## 2022-02-21 DIAGNOSIS — J90 Pleural effusion, not elsewhere classified: Secondary | ICD-10-CM | POA: Diagnosis not present

## 2022-02-21 DIAGNOSIS — R0600 Dyspnea, unspecified: Secondary | ICD-10-CM | POA: Diagnosis not present

## 2022-02-21 DIAGNOSIS — R0609 Other forms of dyspnea: Secondary | ICD-10-CM | POA: Insufficient documentation

## 2022-02-21 DIAGNOSIS — I10 Essential (primary) hypertension: Secondary | ICD-10-CM

## 2022-02-21 DIAGNOSIS — E785 Hyperlipidemia, unspecified: Secondary | ICD-10-CM | POA: Diagnosis not present

## 2022-02-21 DIAGNOSIS — Z952 Presence of prosthetic heart valve: Secondary | ICD-10-CM

## 2022-02-21 MED ORDER — FUROSEMIDE 20 MG PO TABS: 20.0000 mg | ORAL_TABLET | Freq: Every day | ORAL | 0 refills | Status: DC

## 2022-02-21 MED ORDER — FUROSEMIDE 20 MG PO TABS: 20.0000 mg | ORAL_TABLET | Freq: Every day | ORAL | 3 refills | Status: DC

## 2022-02-21 MED ORDER — FUROSEMIDE 20 MG PO TABS
20.0000 mg | ORAL_TABLET | Freq: Every day | ORAL | 0 refills | Status: DC
  Filled 2022-02-21: qty 30, 30d supply, fill #0

## 2022-02-21 NOTE — Patient Instructions (Signed)
Medication Instructions:  ? ?START FUROSEMIDE 20 MG ONCE DAILY ? ?*If you need a refill on your cardiac medications before your next appointment, please call your pharmacy* ? ? ?Lab Work: ? ?Your physician recommends that you return for lab work in: ONE WEEK-DO NOT NEED TO FAST ? ?If you have labs (blood work) drawn today and your tests are completely normal, you will receive your results only by: ?MyChart Message (if you have MyChart) OR ?A paper copy in the mail ?If you have any lab test that is abnormal or we need to change your treatment, we will call you to review the results. ? ? ?Testing/Procedures: ? ?A chest x-ray takes a picture of the organs and structures inside the chest, including the heart, lungs, and blood vessels. This test can show several things, including, whether the heart is enlarges; whether fluid is building up in the lungs; and whether pacemaker / defibrillator leads are still in place. [place] [facility] ? ?Your physician has requested that you have an echocardiogram. Echocardiography is a painless test that uses sound waves to create images of your heart. It provides your doctor with information about the size and shape of your heart and how well your heart?s chambers and valves are working. This procedure takes approximately one hour. There are no restrictions for this procedure. [place] [facility] ? ? ?Follow-Up: ?At [facility], you and your health needs are our priority.  As part of our continuing mission to provide you with exceptional heart care, we have created designated Provider Care Teams.  These Care Teams include your primary Cardiologist (physician) and Advanced Practice Providers (APPs -  Physician Assistants and Nurse Practitioners) who all work together to provide you with the care you need, when you need it. ? ?We recommend signing up for the patient portal called "MyChart".  Sign up information is provided on this After Visit Summary.  MyChart is used to connect with  patients for Virtual Visits (Telemedicine).  Patients are able to view lab/test results, encounter notes, upcoming appointments, etc.  Non-urgent messages can be sent to your provider as well.   ?To learn more about what you can do with MyChart, go to [email or web address].   ? ?Your next appointment:   ?6 week(s) ? ?The format for your next appointment:   ?In Person ? ?Provider:   ?Kirk Ruths, MD  ? ? ? ?

## 2022-02-22 ENCOUNTER — Other Ambulatory Visit (HOSPITAL_COMMUNITY): Payer: Self-pay

## 2022-02-22 ENCOUNTER — Telehealth: Payer: Self-pay | Admitting: *Deleted

## 2022-02-22 DIAGNOSIS — I480 Paroxysmal atrial fibrillation: Secondary | ICD-10-CM

## 2022-02-22 DIAGNOSIS — R609 Edema, unspecified: Secondary | ICD-10-CM

## 2022-02-22 DIAGNOSIS — Z951 Presence of aortocoronary bypass graft: Secondary | ICD-10-CM

## 2022-02-22 LAB — CBC
Hematocrit: 34.4 % — ABNORMAL LOW (ref 37.5–51.0)
Hemoglobin: 11.2 g/dL — ABNORMAL LOW (ref 13.0–17.7)
MCH: 29.4 pg (ref 26.6–33.0)
MCHC: 32.6 g/dL (ref 31.5–35.7)
MCV: 90 fL (ref 79–97)
Platelets: 218 10*3/uL (ref 150–450)
RBC: 3.81 x10E6/uL — ABNORMAL LOW (ref 4.14–5.80)
RDW: 13.2 % (ref 11.6–15.4)
WBC: 8.2 10*3/uL (ref 3.4–10.8)

## 2022-02-22 LAB — PRO B NATRIURETIC PEPTIDE: NT-Pro BNP: 1426 pg/mL — ABNORMAL HIGH (ref 0–486)

## 2022-02-22 LAB — BASIC METABOLIC PANEL
BUN/Creatinine Ratio: 26 — ABNORMAL HIGH (ref 10–24)
BUN: 42 mg/dL — ABNORMAL HIGH (ref 8–27)
CO2: 23 mmol/L (ref 20–29)
Calcium: 9.2 mg/dL (ref 8.6–10.2)
Chloride: 101 mmol/L (ref 96–106)
Creatinine, Ser: 1.6 mg/dL — ABNORMAL HIGH (ref 0.76–1.27)
Glucose: 128 mg/dL — ABNORMAL HIGH (ref 70–99)
Potassium: 4.6 mmol/L (ref 3.5–5.2)
Sodium: 139 mmol/L (ref 134–144)
eGFR: 41 mL/min/{1.73_m2} — ABNORMAL LOW (ref >59)

## 2022-02-22 LAB — TSH: TSH: 5.09 u[IU]/mL — ABNORMAL HIGH (ref 0.450–4.500)

## 2022-02-22 NOTE — Telephone Encounter (Signed)
-----   Message from Lelon Perla, MD sent at 02/22/2022  6:18 AM EDT ----- ?Repeat bmet one week; check free T4 than as well ?Kirk Ruths ? ?

## 2022-02-22 NOTE — Telephone Encounter (Signed)
pt aware of results  Lab orders mailed to the pt  

## 2022-02-28 DIAGNOSIS — R609 Edema, unspecified: Secondary | ICD-10-CM | POA: Diagnosis not present

## 2022-02-28 DIAGNOSIS — I480 Paroxysmal atrial fibrillation: Secondary | ICD-10-CM | POA: Diagnosis not present

## 2022-03-01 LAB — BASIC METABOLIC PANEL
BUN/Creatinine Ratio: 26 — ABNORMAL HIGH (ref 10–24)
BUN: 42 mg/dL — ABNORMAL HIGH (ref 8–27)
CO2: 22 mmol/L (ref 20–29)
Calcium: 8.9 mg/dL (ref 8.6–10.2)
Chloride: 100 mmol/L (ref 96–106)
Creatinine, Ser: 1.6 mg/dL — ABNORMAL HIGH (ref 0.76–1.27)
Glucose: 295 mg/dL — ABNORMAL HIGH (ref 70–99)
Potassium: 4.5 mmol/L (ref 3.5–5.2)
Sodium: 136 mmol/L (ref 134–144)
eGFR: 41 mL/min/{1.73_m2} — ABNORMAL LOW (ref >59)

## 2022-03-01 LAB — TSH+FREE T4
Free T4: 1.46 ng/dL (ref 0.82–1.77)
TSH: 4.77 u[IU]/mL — ABNORMAL HIGH (ref 0.450–4.500)

## 2022-03-06 ENCOUNTER — Other Ambulatory Visit (HOSPITAL_COMMUNITY): Payer: Medicare Other

## 2022-03-06 ENCOUNTER — Encounter: Payer: Self-pay | Admitting: Cardiology

## 2022-03-06 DIAGNOSIS — Z20822 Contact with and (suspected) exposure to covid-19: Secondary | ICD-10-CM | POA: Diagnosis not present

## 2022-03-08 ENCOUNTER — Ambulatory Visit (HOSPITAL_COMMUNITY): Admission: RE | Admit: 2022-03-08 | Payer: Medicare Other | Source: Ambulatory Visit

## 2022-03-19 ENCOUNTER — Ambulatory Visit (HOSPITAL_BASED_OUTPATIENT_CLINIC_OR_DEPARTMENT_OTHER)
Admission: RE | Admit: 2022-03-19 | Discharge: 2022-03-19 | Disposition: A | Discharge status: Home / Self Care | Payer: Medicare Other | Source: Ambulatory Visit | Attending: Cardiology | Admitting: Cardiology

## 2022-03-19 DIAGNOSIS — Z952 Presence of prosthetic heart valve: Secondary | ICD-10-CM | POA: Insufficient documentation

## 2022-03-19 LAB — ECHOCARDIOGRAM COMPLETE
AR max vel: 0.8 cm2
AV Area VTI: 0.87 cm2
AV Area mean vel: 0.92 cm2
AV Mean grad: 14 mmHg
AV Peak grad: 30.7 mmHg
Ao pk vel: 2.77 m/s
Area-P 1/2: 5.09 cm2
S' Lateral: 3.4 cm

## 2022-03-19 NOTE — Progress Notes (Signed)
?  Echocardiogram ?2D Echocardiogram has been performed. ? ?Peter Scott ?03/19/2022, 9:39 AM ?

## 2022-03-22 NOTE — Progress Notes (Signed)
? ? ? ? ?HPI: FU CAD and AVR. Patient is status post coronary artery bypass graft in [place] in 2001. He also had ablation of what sounds to be SVT at that time. Carotid Dopplers March 2019 showed 1 to 39% bilateral stenosis. He underwent TAVR 5/19. Patient had preoperative cardiac catheterization that revealed severe native three-vessel coronary artery disease with patency of grafts.  Also with h/o PAF. Abd ultrasound 2/21 showed no AAA.  Recently seen for increased dyspnea.  ProBNP 1426; hgb 11.2.  Chest x-ray April 2023 showed small left pleural effusion.  Echocardiogram repeated May 2023.  Ejection fraction 50 to 55%, mild left ventricular hypertrophy, grade 2 diastolic dysfunction, severe left atrial enlargement, moderate mitral regurgitation, status post aortic valve replacement with trace aortic insufficiency and mean gradient 14 mmHg.  Since last seen, he has dyspnea on exertion has improved.  He denies orthopnea, PND, pedal edema, chest pain or syncope. ? ?Current Outpatient Medications  ?Medication Sig Dispense Refill  ? acetaminophen (TYLENOL) 500 MG tablet Take 1 tablet (500 mg total) by mouth every 6 (six) hours as needed. (Patient taking differently: Take 500 mg by mouth every 6 (six) hours as needed for moderate pain or headache.) 30 tablet 0  ? amiodarone (PACERONE) 200 MG tablet Take 1 tablet (200 mg total) by mouth daily. 90 tablet 3  ? amLODipine (NORVASC) 5 MG tablet Take 1 tablet (5 mg total) by mouth daily.    ? apixaban (ELIQUIS) 2.5 MG TABS tablet Take 1 tablet (2.5 mg total) by mouth 2 (two) times daily.    ? atorvastatin (LIPITOR) 40 MG tablet Take 40 mg by mouth daily.    ? Cholecalciferol 25 MCG (1000 UT) tablet TAKE ONE TABLET BY MOUTH DAILY FOR LOW VITAMIN D LEVEL    ? furosemide (LASIX) 20 MG tablet Take 1 tablet (20 mg total) by mouth daily. 30 tablet 0  ? insulin glargine-yfgn (SEMGLEE) 100 UNIT/ML Pen 18 Units.    ? losartan (COZAAR) 100 MG tablet Take 0.5 tablets (50 mg total) by  mouth 2 (two) times daily. 90 tablet 1  ? Blood Glucose Monitoring Suppl (CONTOUR NEXT EZ) w/Device KIT 1 kit by Does not apply route daily. Use once a day to check blood sugar.  Dx code: E11.9 1 kit 0  ? COVID-19 mRNA bivalent vaccine, Moderna, (MODERNA COVID-19 BIVAL BOOSTER) 50 MCG/0.5ML injection Inject into the muscle. (Patient not taking: Reported on 10/04/2021) 0.5 mL 0  ? glucose blood (CONTOUR NEXT TEST) test strip Use once a day to check blood sugar.  Dx code: E11.9 100 each 12  ? Insulin Pen Needle (BD PEN NEEDLE NANO U/F) 32G X 4 MM MISC by Does not apply route.    ? ?No current facility-administered medications for this visit.  ? ? ? ?Past Medical History:  ?Diagnosis Date  ? Aortic aneurysm ([place])   ? Aortic stenosis   ? CAD (coronary artery disease)   ? Diabetes mellitus type II, controlled ([place])   ? Heart disease   ? Ablation  ? History of chicken pox   ? Hyperlipidemia   ? Hypertension   ? S/P CABG x 4   ? 2001 - [place]  ? 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 26 mm Edwards Sapien 3 transcatheter heart valve placed via percutaneous right transfemoral approach   ? Umbilical hernia   ? ? ?Past Surgical History:  ?Procedure Laterality Date  ? ABLATION    ? Rhythm problem; rhythm unknown;  [place]  ? CARDIOVERSION N/A 07/14/2018  ? Procedure: CARDIOVERSION;  Surgeon: Dorothy Spark, MD;  Location: [facility] ENDOSCOPY;  Service: Cardiovascular;  Laterality: N/A;  ? CARDIOVERSION N/A 08/27/2018  ? Procedure: CARDIOVERSION;  Surgeon: Jerline Pain, MD;  Location: [facility] ENDOSCOPY;  Service: Cardiovascular;  Laterality: N/A;  ? CORONARY ARTERY BYPASS GRAFT  2001  ? EXPLORATION POST OPERATIVE OPEN HEART  2001, June  ? INTRAOPERATIVE TRANSTHORACIC ECHOCARDIOGRAM N/A 04/01/2018  ? Procedure: INTRAOPERATIVE TRANSTHORACIC ECHOCARDIOGRAM;  Surgeon: Burnell Blanks, MD;  Location: [place];  Service: Open Heart Surgery;  Laterality: N/A;  ? RIGHT/LEFT HEART CATH AND CORONARY/GRAFT  ANGIOGRAPHY N/A 01/31/2018  ? Procedure: RIGHT/LEFT HEART CATH AND CORONARY/GRAFT ANGIOGRAPHY;  Surgeon: Burnell Blanks, MD;  Location: [place] CV LAB;  Service: Cardiovascular;  Laterality: N/A;  ? TEE WITHOUT CARDIOVERSION N/A 01/17/2018  ? Procedure: TRANSESOPHAGEAL ECHOCARDIOGRAM (TEE);  Surgeon: Lelon Perla, MD;  Location: [facility] ENDOSCOPY;  Service: Cardiovascular;  Laterality: N/A;  ? TEE WITHOUT CARDIOVERSION N/A 07/14/2018  ? Procedure: TRANSESOPHAGEAL ECHOCARDIOGRAM (TEE);  Surgeon: Dorothy Spark, MD;  Location: [place];  Service: Cardiovascular;  Laterality: N/A;  ? TONSILLECTOMY    ? TRANSCATHETER AORTIC VALVE REPLACEMENT, TRANSFEMORAL N/A 04/01/2018  ? Procedure: TRANSCATHETER AORTIC VALVE REPLACEMENT, TRANSFEMORAL;  Surgeon: Burnell Blanks, MD;  Location: [place];  Service: Open Heart Surgery;  Laterality: N/A;  ? UMBILICAL HERNIA REPAIR  2015  ? ? ?Social History  ? ?Socioeconomic History  ? Marital status: Married  ?  Spouse name: Not on file  ? Number of children: 2  ? Years of education: Not on file  ? Highest education level: Not on file  ?Occupational History  ? Not on file  ?Tobacco Use  ? Smoking status: Former  ?  Packs/day: 1.00  ?  Years: 3.00  ?  Pack years: 3.00  ?  Types: Cigarettes  ? Smokeless tobacco: Never  ? Tobacco comments:  ?  quit tobbaco in his 38s  ?Vaping Use  ? Vaping Use: Never used  ?Substance and Sexual Activity  ? Alcohol use: No  ?  Alcohol/week: 0.0 standard drinks  ? Drug use: No  ? Sexual activity: Not on file  ?Other Topics Concern  ? Not on file  ?Social History Narrative  ? Not on file  ? ?Social Determinants of Health  ? ?Financial Resource Strain: Low Risk   ? Difficulty of Paying Living Expenses: Not hard at all  ?Food Insecurity: No Food Insecurity  ? Worried About Charity fundraiser in the Last Year: Never true  ? Ran Out of Food in the Last Year: Never true  ?Transportation Needs: No Transportation Needs  ? Lack of Transportation  (Medical): No  ? Lack of Transportation (Non-Medical): No  ?Physical Activity: Insufficiently Active  ? Days of Exercise per Week: 4 days  ? Minutes of Exercise per Session: 30 min  ?Stress: No Stress Concern Present  ? Feeling of Stress : Not at all  ?Social Connections: Moderately Integrated  ? Frequency of Communication with Friends and Family: More than three times a week  ? Frequency of Social Gatherings with Friends and Family: More than three times a week  ? Attends Religious Services: More than 4 times per year  ? Active Member of Clubs or Organizations: No  ? Attends Archivist Meetings: Never  ? Marital Status: Married  ?Intimate Partner Violence: Not At Risk  ? Fear of Current or Ex-Partner: No  ?  Emotionally Abused: No  ? Physically Abused: No  ? Sexually Abused: No  ? ? ?Family History  ?Problem Relation Age of Onset  ? Diabetes Mother 57  ?     Deceased  ? Heart disease Father 21  ?     Deceased  ? Heart disease Maternal Uncle   ? Diabetes Maternal Uncle   ? Heart disease Brother   ? Diabetes Brother   ? Diabetes Sister   ?     #1  ? Heart disease Sister   ?     #2  ? Hyperlipidemia Son   ?     x2  ? ? ?ROS: no fevers or chills, productive cough, hemoptysis, dysphasia, odynophagia, melena, hematochezia, dysuria, hematuria, rash, seizure activity, orthopnea, PND, pedal edema, claudication. Remaining systems are negative. ? ?Physical Exam: ?Well-developed well-nourished in no acute distress.  ?Skin is warm and dry.  ?HEENT is normal.  ?Neck is supple.  ?Chest is clear to auscultation with normal expansion.  ?Cardiovascular exam is regular rate and rhythm.  2/6 systolic murmur left sternal border.  2/6 diastolic murmur. ?Abdominal exam nontender or distended. No masses palpated. ?Extremities show trace edema. ?neuro grossly intact ? ?A/P ? ?1 dyspnea-improved.  We will continue low-dose Lasix. ? ?2 paroxysmal atrial fibrillation-he is in sinus rhythm on examination.  Continue amiodarone and  apixaban. ? ?3 prior TAVR-recent echocardiogram showed normal LV function.  Gradient across prosthetic aortic valve normal.  There was trace aortic insufficiency though it should be noted he has a 2/6 diastoli

## 2022-03-28 ENCOUNTER — Ambulatory Visit: Payer: Medicare Other | Admitting: Cardiology

## 2022-03-30 ENCOUNTER — Other Ambulatory Visit: Payer: Self-pay | Admitting: Physician Assistant

## 2022-03-30 ENCOUNTER — Other Ambulatory Visit: Payer: Self-pay | Admitting: Family Medicine

## 2022-03-30 ENCOUNTER — Other Ambulatory Visit (HOSPITAL_BASED_OUTPATIENT_CLINIC_OR_DEPARTMENT_OTHER): Payer: Self-pay

## 2022-03-30 NOTE — Telephone Encounter (Signed)
This is Dr. Crenshaw's pt 

## 2022-04-02 ENCOUNTER — Other Ambulatory Visit (HOSPITAL_BASED_OUTPATIENT_CLINIC_OR_DEPARTMENT_OTHER): Payer: Self-pay

## 2022-04-03 ENCOUNTER — Other Ambulatory Visit (HOSPITAL_BASED_OUTPATIENT_CLINIC_OR_DEPARTMENT_OTHER): Payer: Self-pay

## 2022-04-04 ENCOUNTER — Ambulatory Visit (INDEPENDENT_AMBULATORY_CARE_PROVIDER_SITE_OTHER): Payer: Medicare Other | Admitting: Cardiology

## 2022-04-04 ENCOUNTER — Encounter: Payer: Self-pay | Admitting: Cardiology

## 2022-04-04 VITALS — BP 170/68 | HR 52 | Ht 69.0 in | Wt 160.0 lb

## 2022-04-04 DIAGNOSIS — E785 Hyperlipidemia, unspecified: Secondary | ICD-10-CM

## 2022-04-04 DIAGNOSIS — Z952 Presence of prosthetic heart valve: Secondary | ICD-10-CM | POA: Diagnosis not present

## 2022-04-04 DIAGNOSIS — R0609 Other forms of dyspnea: Secondary | ICD-10-CM

## 2022-04-04 DIAGNOSIS — I10 Essential (primary) hypertension: Secondary | ICD-10-CM

## 2022-04-04 DIAGNOSIS — Z951 Presence of aortocoronary bypass graft: Secondary | ICD-10-CM

## 2022-04-04 DIAGNOSIS — I480 Paroxysmal atrial fibrillation: Secondary | ICD-10-CM | POA: Diagnosis not present

## 2022-04-04 NOTE — Patient Instructions (Signed)

## 2022-04-13 ENCOUNTER — Encounter: Payer: Self-pay | Admitting: Cardiology

## 2022-05-28 DIAGNOSIS — L814 Other melanin hyperpigmentation: Secondary | ICD-10-CM | POA: Diagnosis not present

## 2022-05-28 DIAGNOSIS — Z85828 Personal history of other malignant neoplasm of skin: Secondary | ICD-10-CM | POA: Diagnosis not present

## 2022-05-28 DIAGNOSIS — C44629 Squamous cell carcinoma of skin of left upper limb, including shoulder: Secondary | ICD-10-CM | POA: Diagnosis not present

## 2022-05-28 DIAGNOSIS — L570 Actinic keratosis: Secondary | ICD-10-CM | POA: Diagnosis not present

## 2022-05-28 DIAGNOSIS — X32XXXS Exposure to sunlight, sequela: Secondary | ICD-10-CM | POA: Diagnosis not present

## 2022-05-28 DIAGNOSIS — Z129 Encounter for screening for malignant neoplasm, site unspecified: Secondary | ICD-10-CM | POA: Diagnosis not present

## 2022-06-13 ENCOUNTER — Ambulatory Visit: Payer: Medicare Other | Admitting: Cardiology

## 2022-06-29 ENCOUNTER — Encounter: Payer: Self-pay | Admitting: Cardiology

## 2022-07-20 ENCOUNTER — Telehealth: Payer: Self-pay | Admitting: Family Medicine

## 2022-07-20 ENCOUNTER — Encounter: Payer: Self-pay | Admitting: Family Medicine

## 2022-07-20 NOTE — Telephone Encounter (Signed)
Pt called this am with [facility] of [facility] about [facility]-sounds like a AAA.  He was not sure what to do I have reviewed his chart - it looks like this may be the concern from a CT from 8/29:  4. 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 Consider vascular surgery  consultation if clinically indicated.   Message back to pt; would suggest a consult with vascular, I can refer if he likes

## 2022-08-20 ENCOUNTER — Encounter: Payer: Self-pay | Admitting: Family Medicine

## 2022-08-20 ENCOUNTER — Ambulatory Visit (INDEPENDENT_AMBULATORY_CARE_PROVIDER_SITE_OTHER): Payer: Medicare Other

## 2022-08-20 DIAGNOSIS — Z23 Encounter for immunization: Secondary | ICD-10-CM

## 2022-09-05 NOTE — Progress Notes (Signed)
HPI:  FU CAD and AVR. Patient is status post coronary artery bypass graft in [place] in 2001. He also had ablation of what sounds to be SVT at that time. Carotid Dopplers March 2019 showed 1 to 39% bilateral stenosis. He underwent TAVR 5/19. Patient had preoperative cardiac catheterization that revealed severe native three-vessel coronary artery disease with patency of grafts.  Also with h/o PAF. Abd ultrasound 2/21 showed no AAA. Echocardiogram repeated May 2023.  Ejection fraction 50 to 55%, mild left ventricular hypertrophy, grade 2 diastolic dysfunction, severe left atrial enlargement, moderate mitral regurgitation, status post aortic valve replacement with trace aortic insufficiency and mean gradient 14 mmHg.  Since last seen, he denies dyspnea, chest pain, palpitations, syncope.  Occasional minimal pedal edema.    Current Outpatient Medications  Medication Sig Dispense Refill   acetaminophen (TYLENOL) 500 MG tablet Take 500 mg by mouth every 6 (six) hours as needed for mild pain or moderate pain.     amiodarone (PACERONE) 200 MG tablet Take 1 tablet (200 mg total) by mouth daily. 90 tablet 3   amLODipine (NORVASC) 10 MG tablet Take 10 mg by mouth daily.     apixaban (ELIQUIS) 2.5 MG TABS tablet Take 1 tablet (2.5 mg total) by mouth 2 (two) times daily.     atorvastatin (LIPITOR) 40 MG tablet Take 40 mg by mouth daily.     Blood Glucose Monitoring Suppl (CONTOUR NEXT EZ) w/Device KIT 1 kit by Does not apply route daily. Use once a day to check blood sugar.  Dx code: E11.9 1 kit 0   Cholecalciferol 25 MCG (1000 UT) tablet TAKE ONE TABLET BY MOUTH DAILY FOR LOW VITAMIN D LEVEL     COVID-19 mRNA bivalent vaccine, Moderna, (MODERNA COVID-19 BIVAL BOOSTER) 50 MCG/0.5ML injection Inject into the muscle. 0.5 mL 0   ferrous sulfate 325 (65 FE) MG tablet Take 325 mg by mouth daily with breakfast.     furosemide (LASIX) 20 MG tablet Take 20 mg by mouth every morning.     glucose blood (CONTOUR NEXT  TEST) test strip Use once a day to check blood sugar.  Dx code: E11.9 100 each 12   insulin glargine-yfgn (SEMGLEE) 100 UNIT/ML Pen 18 Units.     Insulin Pen Needle (BD PEN NEEDLE NANO U/F) 32G X 4 MM MISC by Does not apply route.     losartan (COZAAR) 100 MG tablet Take 0.5 tablets (50 mg total) by mouth 2 (two) times daily. 90 tablet 1   No current facility-administered medications for this visit.     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   26 mm Edwards Sapien 3 transcatheter heart valve placed via percutaneous right transfemoral approach    Umbilical hernia     Past Surgical History:  Procedure Laterality Date   ABLATION     Rhythm problem; rhythm unknown; [place]   CARDIOVERSION N/A 07/14/2018   Procedure: CARDIOVERSION;  Surgeon: Dorothy Spark, MD;  Location: [place];  Service: Cardiovascular;  Laterality: N/A;   CARDIOVERSION N/A 08/27/2018   Procedure: CARDIOVERSION;  Surgeon: Jerline Pain, MD;  Location: [facility] ENDOSCOPY;  Service: Cardiovascular;  Laterality: N/A;   CORONARY ARTERY BYPASS GRAFT  2001   EXPLORATION POST OPERATIVE OPEN HEART  2001, June   INTRAOPERATIVE TRANSTHORACIC ECHOCARDIOGRAM N/A 04/01/2018   Procedure: INTRAOPERATIVE TRANSTHORACIC ECHOCARDIOGRAM;  Surgeon: Burnell Blanks, MD;  Location: [place];  Service: Open Heart Surgery;  Laterality: N/A;   RIGHT/LEFT HEART CATH AND CORONARY/GRAFT ANGIOGRAPHY N/A 01/31/2018   Procedure: RIGHT/LEFT HEART CATH AND CORONARY/GRAFT ANGIOGRAPHY;  Surgeon: Burnell Blanks, MD;  Location: [place] CV LAB;  Service: Cardiovascular;  Laterality: N/A;   TEE WITHOUT CARDIOVERSION N/A  01/17/2018   Procedure: TRANSESOPHAGEAL ECHOCARDIOGRAM (TEE);  Surgeon: Lelon Perla, MD;  Location: [facility] ENDOSCOPY;  Service: Cardiovascular;  Laterality: N/A;   TEE WITHOUT CARDIOVERSION N/A 07/14/2018   Procedure: TRANSESOPHAGEAL ECHOCARDIOGRAM (TEE);  Surgeon: Dorothy Spark, MD;  Location: [place];  Service: Cardiovascular;  Laterality: N/A;   TONSILLECTOMY     TRANSCATHETER AORTIC VALVE REPLACEMENT, TRANSFEMORAL N/A 04/01/2018   Procedure: TRANSCATHETER AORTIC VALVE REPLACEMENT, TRANSFEMORAL;  Surgeon: Burnell Blanks, MD;  Location: [place];  Service: Open Heart Surgery;  Laterality: N/A;   UMBILICAL HERNIA REPAIR  2015    Social History   Socioeconomic History   Marital status: Married    Spouse name: Not on file   Number of children: 2   Years of education: Not on file   Highest education level: Not on file  Occupational History   Not on file  Tobacco Use   Smoking status: Former    Packs/day: 1.00    Years: 3.00    Total pack years: 3.00    Types: Cigarettes   Smokeless tobacco: Never   Tobacco comments:    quit tobbaco in his 12s  Vaping Use   Vaping Use: Never used  Substance and Sexual Activity   Alcohol use: No    Alcohol/week: 0.0 standard drinks of alcohol   Drug use: No   Sexual activity: Not on file  Other Topics Concern   Not on file  Social History Narrative   Not on file   Social Determinants of Health   Financial Resource Strain: Low Risk  (04/27/2021)   Overall Financial Resource Strain (CARDIA)    Difficulty of Paying Living Expenses: Not hard at all  Food Insecurity: No Food Insecurity (06/28/2021)   Hunger Vital Sign    Worried About Running Out of Food in the Last Year: Never true    [place] in the Last Year: Never true  Transportation Needs: No Transportation Needs (06/28/2021)   PRAPARE - Hydrologist (Medical): No    Lack of Transportation (Non-Medical): No  Physical Activity:  Insufficiently Active (04/27/2021)   Exercise Vital Sign    Days of Exercise per Week: 4 days    Minutes of Exercise per Session: 30 min  Stress: No Stress Concern Present (06/28/2021)   [place]    Feeling of Stress : Not at all  Social Connections: Moderately Integrated (06/28/2021)   Social Connection and Isolation Panel [NHANES]    Frequency of Communication with Friends and Family: More than three times a week    Frequency of Social Gatherings with Friends and Family: More than three times a week    Attends Religious Services: More than 4 times per year    Active Member of [employer] or Organizations: No    Attends [employer]  or Organization Meetings: Never    Marital Status: Married  Human resources officer Violence: Not At Risk (06/28/2021)   Humiliation, Afraid, Rape, and Kick questionnaire    Fear of Current or Ex-Partner: No    Emotionally Abused: No    Physically Abused: No    Sexually Abused: No    Family History  Problem Relation Age of Onset   Diabetes Mother 73       Deceased   Heart disease Father 81       Deceased   Heart disease Maternal Uncle    Diabetes Maternal Uncle    Heart disease Brother    Diabetes Brother    Diabetes Sister        #1   Heart disease Sister        #2   Hyperlipidemia Son        x2    ROS: no fevers or chills, productive cough, hemoptysis, dysphasia, odynophagia, melena, hematochezia, dysuria, hematuria, rash, seizure activity, orthopnea, PND, pedal edema, claudication. Remaining systems are negative.  Physical Exam: Well-developed well-nourished in no acute distress.  Skin is warm and dry.  HEENT is normal.  Neck is supple.  Chest is clear to auscultation with normal expansion.  Cardiovascular exam is regular rate and rhythm.  2/6 systolic murmur left sternal border.  1/6 diastolic murmur. Abdominal exam nontender or distended. No masses palpated. Extremities show trace  edema. neuro grossly intact  ECG-sinus bradycardia at a rate of 50, normal axis, left ventricular hypertrophy.  Personally reviewed  A/P  1 paroxysmal atrial fibrillation-patient remains in sinus rhythm.  We will continue amiodarone and apixaban at present dose.  Patient had blood work drawn recently at the [place].  We will have his most recent liver functions and TSH forwarded to [place].  2 history of TAVR-most recent echocardiogram showed normal gradient across prosthetic aortic valve and trace aortic insufficiency.  He does have a significant diastolic murmur on examination and will need follow-up echoes in the future.  Continue SBE prophylaxis.  3 coronary artery disease-patient without chest pain.  Continue medical therapy including statin.  No aspirin given need for anticoagulation.  4 hyperlipidemia-continue statin.  5 hypertension-blood pressure elevated.  However he checks this at home routinely and it is controlled.  We will continue present medications and follow.  6 chronic diastolic congestive heart failure-continue Lasix at present dose.   Kirk Ruths, MD

## 2022-09-07 ENCOUNTER — Telehealth: Payer: Self-pay | Admitting: Family Medicine

## 2022-09-07 NOTE — Telephone Encounter (Signed)
Pt dropped off copy of Living Will for provider to see and have on pt's chart. Document put at front office tray under providers name.

## 2022-09-07 NOTE — Telephone Encounter (Signed)
In folder for review.

## 2022-09-19 ENCOUNTER — Encounter: Payer: Self-pay | Admitting: Cardiology

## 2022-09-19 ENCOUNTER — Ambulatory Visit: Payer: Medicare Other | Attending: Cardiology | Admitting: Cardiology

## 2022-09-19 VITALS — BP 162/58 | HR 50 | Ht 69.0 in | Wt 163.8 lb

## 2022-09-19 DIAGNOSIS — I480 Paroxysmal atrial fibrillation: Secondary | ICD-10-CM | POA: Diagnosis not present

## 2022-09-19 DIAGNOSIS — I10 Essential (primary) hypertension: Secondary | ICD-10-CM | POA: Insufficient documentation

## 2022-09-19 DIAGNOSIS — Z952 Presence of prosthetic heart valve: Secondary | ICD-10-CM | POA: Insufficient documentation

## 2022-09-19 DIAGNOSIS — E785 Hyperlipidemia, unspecified: Secondary | ICD-10-CM | POA: Diagnosis not present

## 2022-09-19 DIAGNOSIS — Z951 Presence of aortocoronary bypass graft: Secondary | ICD-10-CM | POA: Diagnosis not present

## 2022-09-19 NOTE — Patient Instructions (Signed)
  Follow-Up: At  [facility], you and your health needs are our priority.  As part of our continuing mission to provide you with exceptional heart care, we have created designated Provider Care Teams.  These Care Teams include your primary Cardiologist (physician) and Advanced Practice Providers (APPs -  Physician Assistants and Nurse Practitioners) who all work together to provide you with the care you need, when you need it.  We recommend signing up for the patient portal called "MyChart".  Sign up information is provided on this After Visit Summary.  MyChart is used to connect with patients for Virtual Visits (Telemedicine).  Patients are able to view lab/test results, encounter notes, upcoming appointments, etc.  Non-urgent messages can be sent to your provider as well.   To learn more about what you can do with MyChart, go to [email or web address].    Your next appointment:   6 month(s)  The format for your next appointment:   In Person  Provider:   Brian Crenshaw, MD    

## 2022-09-24 ENCOUNTER — Encounter: Payer: Self-pay | Admitting: *Deleted

## 2022-09-25 ENCOUNTER — Other Ambulatory Visit (HOSPITAL_BASED_OUTPATIENT_CLINIC_OR_DEPARTMENT_OTHER): Payer: Self-pay

## 2022-09-25 MED ORDER — AMOXICILLIN 500 MG PO CAPS
2000.0000 mg | ORAL_CAPSULE | ORAL | 3 refills | Status: DC
  Filled 2022-09-25: qty 20, 5d supply, fill #0

## 2022-11-15 ENCOUNTER — Encounter: Payer: Self-pay | Admitting: Family Medicine

## 2022-11-15 ENCOUNTER — Ambulatory Visit (INDEPENDENT_AMBULATORY_CARE_PROVIDER_SITE_OTHER): Payer: Medicare Other | Admitting: Family Medicine

## 2022-11-15 VITALS — BP 133/47 | HR 62 | Temp 98.0°F | Resp 16 | Ht 69.0 in | Wt 166.6 lb

## 2022-11-15 DIAGNOSIS — R059 Cough, unspecified: Secondary | ICD-10-CM

## 2022-11-15 DIAGNOSIS — J029 Acute pharyngitis, unspecified: Secondary | ICD-10-CM

## 2022-11-15 LAB — POC COVID19 BINAXNOW: SARS Coronavirus 2 Ag: NEGATIVE

## 2022-11-15 LAB — POCT RAPID STREP A (OFFICE): Rapid Strep A Screen: NEGATIVE

## 2022-11-15 LAB — POCT INFLUENZA A/B
Influenza A, POC: NEGATIVE
Influenza B, POC: NEGATIVE

## 2022-11-15 NOTE — Progress Notes (Signed)
Acute Office Visit  Subjective:     Patient ID: Peter Scott, male    DOB: 06/12/1932, 88 y.o.   MRN: [id number]  Chief Complaint  Patient presents with   Sore Throat   Cough   Nasal Congestion    HPI Patient is in today for URI.   Patient reports he started feeling bad about a week ago with cough and rhinorrhea; sore throat just started in the past few days. States today he is feeling quite a bit better compared to the past week. No known sick contacts. He has only been taking occasional tylenol and cough drops. He denies any headache, sinus pressure, chest pain, dyspnea, fevers, nausea, vomiting, diarrhea.   ROS All review of systems negative except what is listed in the HPI      Objective:    BP (!) 133/47   Pulse 62   Temp 98 F (36.7 C)   Resp 16   Ht '5\' 9"'$  (1.753 m)   Wt 166 lb 9.6 oz (75.6 kg)   SpO2 94%   BMI 24.60 kg/m    Physical Exam Vitals reviewed.  Constitutional:      Appearance: Normal appearance.  HENT:     Head: Normocephalic and atraumatic.     Right Ear: Tympanic membrane normal.     Left Ear: Tympanic membrane normal.     Nose: Rhinorrhea present.     Mouth/Throat:     Mouth: Mucous membranes are moist.     Pharynx: Oropharynx is clear. No oropharyngeal exudate or posterior oropharyngeal erythema.  Cardiovascular:     Rate and Rhythm: Normal rate and regular rhythm.     Heart sounds: Murmur heard.  Pulmonary:     Effort: Pulmonary effort is normal.     Breath sounds: Normal breath sounds.  Musculoskeletal:     Cervical back: Normal range of motion and neck supple. No tenderness.  Lymphadenopathy:     Cervical: No cervical adenopathy.  Skin:    General: Skin is warm and dry.  Neurological:     Mental Status: He is alert and oriented to person, place, and time.  Psychiatric:        Mood and Affect: Mood normal.        Behavior: Behavior normal.        Thought Content: Thought content normal.        Judgment: Judgment normal.        Results for orders placed or performed in visit on 11/15/22  POC COVID-19 BinaxNow  Result Value Ref Range   SARS Coronavirus 2 Ag Negative Negative  POCT Influenza A/B  Result Value Ref Range   Influenza A, POC Negative Negative   Influenza B, POC Negative Negative  POCT rapid strep A  Result Value Ref Range   Rapid Strep A Screen Negative Negative        Assessment & Plan:   Problem List Items Addressed This Visit   None Visit Diagnoses     Sore throat    -  Primary   Relevant Orders   POC COVID-19 BinaxNow (Completed)   POCT Influenza A/B (Completed)   POCT rapid strep A (Completed)   Cough, unspecified type       Relevant Orders   POC COVID-19 BinaxNow (Completed)   POCT Influenza A/B (Completed)   POCT rapid strep A (Completed)      Flu, COVID, and strep tests are negative today Glad to hear you are already starting to improve.  Recommend we continue with conservative measures since you are making progress. Please let us know if anything changes.  Continue supportive measures including rest, hydration, humidifier use, steam showers, warm compresses to sinuses, warm liquids with lemon and honey, and over-the-counter cough, cold, and analgesics as needed.   Please contact office for follow-up if symptoms do not improve or worsen. Seek emergency care if symptoms become severe.     No orders of the defined types were placed in this encounter.   Return if symptoms worsen or fail to improve.  Terrilyn Saver, NP

## 2022-11-15 NOTE — Patient Instructions (Signed)
Flu, COVID, and strep tests are negative today Glad to hear you are already starting to improve. Recommend we continue with conservative measures since you are making progress. Please let us know if anything changes.  Continue supportive measures including rest, hydration, humidifier use, steam showers, warm compresses to sinuses, warm liquids with lemon and honey, and over-the-counter cough, cold, and analgesics as needed.   Please contact office for follow-up if symptoms do not improve or worsen. Seek emergency care if symptoms become severe.

## 2022-11-23 ENCOUNTER — Other Ambulatory Visit (HOSPITAL_BASED_OUTPATIENT_CLINIC_OR_DEPARTMENT_OTHER): Payer: Self-pay

## 2022-11-23 DIAGNOSIS — S65312A Laceration of deep palmar arch of left hand, initial encounter: Secondary | ICD-10-CM | POA: Diagnosis not present

## 2022-11-23 DIAGNOSIS — Z23 Encounter for immunization: Secondary | ICD-10-CM | POA: Diagnosis not present

## 2022-11-23 MED ORDER — DOXYCYCLINE HYCLATE 100 MG PO TABS
100.0000 mg | ORAL_TABLET | Freq: Two times a day (BID) | ORAL | 0 refills | Status: DC
  Filled 2022-11-23: qty 14, 7d supply, fill #0

## 2022-11-27 DIAGNOSIS — Z5189 Encounter for other specified aftercare: Secondary | ICD-10-CM | POA: Diagnosis not present

## 2022-12-03 DIAGNOSIS — C44319 Basal cell carcinoma of skin of other parts of face: Secondary | ICD-10-CM | POA: Diagnosis not present

## 2022-12-03 DIAGNOSIS — Z85828 Personal history of other malignant neoplasm of skin: Secondary | ICD-10-CM | POA: Diagnosis not present

## 2022-12-03 DIAGNOSIS — L570 Actinic keratosis: Secondary | ICD-10-CM | POA: Diagnosis not present

## 2022-12-03 DIAGNOSIS — D485 Neoplasm of uncertain behavior of skin: Secondary | ICD-10-CM | POA: Diagnosis not present

## 2022-12-03 DIAGNOSIS — Z129 Encounter for screening for malignant neoplasm, site unspecified: Secondary | ICD-10-CM | POA: Diagnosis not present

## 2022-12-03 DIAGNOSIS — L814 Other melanin hyperpigmentation: Secondary | ICD-10-CM | POA: Diagnosis not present

## 2022-12-03 DIAGNOSIS — L820 Inflamed seborrheic keratosis: Secondary | ICD-10-CM | POA: Diagnosis not present

## 2022-12-10 NOTE — Progress Notes (Signed)
HPI: FU CAD and AVR. Patient is status post coronary artery bypass graft in [place] in 2001. He also had ablation of what sounds to be SVT at that time. Carotid Dopplers March 2019 showed 1 to 39% bilateral stenosis. He underwent TAVR 5/19. Patient had preoperative cardiac catheterization that revealed severe native three-vessel coronary artery disease with patency of grafts.  Also with h/o PAF. Abd ultrasound 2/21 showed no AAA. Echocardiogram repeated May 2023.  Ejection fraction 50 to 55%, mild left ventricular hypertrophy, grade 2 diastolic dysfunction, severe left atrial enlargement, moderate mitral regurgitation, status post aortic valve replacement with trace aortic insufficiency and mean gradient 14 mmHg.  Since last seen, for the first 3 weeks in January patient complained of productive cough as well as increased dyspnea.  He has noticed increased pedal edema.  There is no exertional chest pain or syncope.  Current Outpatient Medications  Medication Sig Dispense Refill   acetaminophen (TYLENOL) 500 MG tablet Take 500 mg by mouth every 6 (six) hours as needed for mild pain or moderate pain.     amiodarone (PACERONE) 200 MG tablet Take 1 tablet (200 mg total) by mouth daily. 90 tablet 3   amLODipine (NORVASC) 10 MG tablet Take 10 mg by mouth daily.     amoxicillin (AMOXIL) 500 MG capsule Take 4 capsules (2,000 mg total) by mouth one hour prior to dental procedure. 20 capsule 3   apixaban (ELIQUIS) 2.5 MG TABS tablet Take 1 tablet (2.5 mg total) by mouth 2 (two) times daily.     atorvastatin (LIPITOR) 40 MG tablet Take 40 mg by mouth daily.     Blood Glucose Monitoring Suppl (CONTOUR NEXT EZ) w/Device KIT 1 kit by Does not apply route daily. Use once a day to check blood sugar.  Dx code: E11.9 1 kit 0   Cholecalciferol 25 MCG (1000 UT) tablet TAKE ONE TABLET BY MOUTH DAILY FOR LOW VITAMIN D LEVEL     COVID-19 mRNA bivalent vaccine, Moderna, (MODERNA COVID-19 BIVAL BOOSTER) 50 MCG/0.5ML  injection Inject into the muscle. 0.5 mL 0   ferrous sulfate 325 (65 FE) MG tablet Take 325 mg by mouth daily with breakfast.     furosemide (LASIX) 20 MG tablet Take 20 mg by mouth every morning.     glucose blood (CONTOUR NEXT TEST) test strip Use once a day to check blood sugar.  Dx code: E11.9 100 each 12   insulin glargine-yfgn (SEMGLEE) 100 UNIT/ML Pen 18 Units.     Insulin Pen Needle (BD PEN NEEDLE NANO U/F) 32G X 4 MM MISC by Does not apply route.     losartan (COZAAR) 100 MG tablet Take 0.5 tablets (50 mg total) by mouth 2 (two) times daily. 90 tablet 1   No current facility-administered medications for this visit.     Past Medical History:  Diagnosis Date   Aortic aneurysm (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   26 mm Edwards Sapien 3 transcatheter heart valve placed via percutaneous right transfemoral approach    Umbilical hernia     Past Surgical History:  Procedure Laterality Date   ABLATION  Rhythm problem; rhythm unknown; [place]   CARDIOVERSION N/A 07/14/2018   Procedure: CARDIOVERSION;  Surgeon: Dorothy Spark, MD;  Location: [facility] ENDOSCOPY;  Service: Cardiovascular;  Laterality: N/A;   CARDIOVERSION N/A 08/27/2018   Procedure: CARDIOVERSION;  Surgeon: Jerline Pain, MD;  Location: [facility] ENDOSCOPY;  Service: Cardiovascular;  Laterality: N/A;   CORONARY ARTERY BYPASS GRAFT  2001   EXPLORATION POST OPERATIVE OPEN HEART  2001, June   INTRAOPERATIVE TRANSTHORACIC ECHOCARDIOGRAM N/A 04/01/2018   Procedure: INTRAOPERATIVE TRANSTHORACIC ECHOCARDIOGRAM;  Surgeon: Burnell Blanks, MD;  Location: [place];  Service: Open Heart Surgery;  Laterality: N/A;   RIGHT/LEFT HEART CATH AND CORONARY/GRAFT ANGIOGRAPHY  N/A 01/31/2018   Procedure: RIGHT/LEFT HEART CATH AND CORONARY/GRAFT ANGIOGRAPHY;  Surgeon: Burnell Blanks, MD;  Location: [place] CV LAB;  Service: Cardiovascular;  Laterality: N/A;   TEE WITHOUT CARDIOVERSION N/A 01/17/2018   Procedure: TRANSESOPHAGEAL ECHOCARDIOGRAM (TEE);  Surgeon: Lelon Perla, MD;  Location: [facility] ENDOSCOPY;  Service: Cardiovascular;  Laterality: N/A;   TEE WITHOUT CARDIOVERSION N/A 07/14/2018   Procedure: TRANSESOPHAGEAL ECHOCARDIOGRAM (TEE);  Surgeon: Dorothy Spark, MD;  Location: [place];  Service: Cardiovascular;  Laterality: N/A;   TONSILLECTOMY     TRANSCATHETER AORTIC VALVE REPLACEMENT, TRANSFEMORAL N/A 04/01/2018   Procedure: TRANSCATHETER AORTIC VALVE REPLACEMENT, TRANSFEMORAL;  Surgeon: Burnell Blanks, MD;  Location: [place];  Service: Open Heart Surgery;  Laterality: N/A;   UMBILICAL HERNIA REPAIR  2015    Social History   Socioeconomic History   Marital status: Married    Spouse name: Not on file   Number of children: 2   Years of education: Not on file   Highest education level: Not on file  Occupational History   Not on file  Tobacco Use   Smoking status: Former    Packs/day: 1.00    Years: 3.00    Total pack years: 3.00    Types: Cigarettes   Smokeless tobacco: Never   Tobacco comments:    quit tobbaco in his 68s  Vaping Use   Vaping Use: Never used  Substance and Sexual Activity   Alcohol use: No    Alcohol/week: 0.0 standard drinks of alcohol   Drug use: No   Sexual activity: Not on file  Other Topics Concern   Not on file  Social History Narrative   Not on file   Social Determinants of Health   Financial Resource Strain: Low Risk  (04/27/2021)   Overall Financial Resource Strain (CARDIA)    Difficulty of Paying Living Expenses: Not hard at all  Food Insecurity: No Food Insecurity (06/28/2021)   Hunger Vital Sign    Worried About Running Out of Food in the Last Year: Never true    [place] in the  Last Year: Never true  Transportation Needs: No Transportation Needs (06/28/2021)   PRAPARE - Hydrologist (Medical): No    Lack of Transportation (Non-Medical): No  Physical Activity: Insufficiently Active (04/27/2021)   Exercise Vital Sign    Days of Exercise per Week: 4 days    Minutes of Exercise per Session: 30 min  Stress: No Stress Concern Present (06/28/2021)   [place]    Feeling of Stress : Not at all  Social Connections: Moderately Integrated (06/28/2021)   Social Connection and Isolation Panel [NHANES]    Frequency of Communication with Friends and Family: More than three times a week  Frequency of Social Gatherings with Friends and Family: More than three times a week    Attends Religious Services: More than 4 times per year    Active Member of [employer] or Organizations: No    Attends Archivist Meetings: Never    Marital Status: Married  Human resources officer Violence: Not At Risk (06/28/2021)   Humiliation, Afraid, Rape, and Kick questionnaire    Fear of Current or Ex-Partner: No    Emotionally Abused: No    Physically Abused: No    Sexually Abused: No    Family History  Problem Relation Age of Onset   Diabetes Mother 17       Deceased   Heart disease Father 11       Deceased   Heart disease Maternal Uncle    Diabetes Maternal Uncle    Heart disease Brother    Diabetes Brother    Diabetes Sister        #1   Heart disease Sister        #2   Hyperlipidemia Son        x2    ROS: no fevers or chills, productive cough, hemoptysis, dysphasia, odynophagia, melena, hematochezia, dysuria, hematuria, rash, seizure activity, orthopnea, PND, pedal edema, claudication. Remaining systems are negative.  Physical Exam: Well-developed well-nourished in no acute distress.  Skin is warm and dry.  HEENT is normal.  Neck is supple.  Chest is clear to auscultation with normal  expansion.  Cardiovascular exam is regular rate and rhythm.  2/6 systolic murmur and diastolic murmur left sternal border. Abdominal exam nontender or distended. No masses palpated. Extremities show !+ edema. neuro grossly intact  ECG-sinus bradycardia at a rate of 55, nonspecific ST changes.,  First-degree AV block.  Personally reviewed  A/P  1 paroxysmal atrial fibrillation-patient remains in sinus rhythm.  Continue amiodarone and apixaban.  Check chest x-ray.  Check TSH.  Recent liver functions normal.  Check hemoglobin and renal function.  2 prior TAVR-previous echocardiogram showed normal gradients and trace aortic insufficiency.  He does have a loud AI murmur on examination and increased pulse pressure noted on blood pressure.  Note he also had moderate mitral regurgitation.  He is complaining of increased dyspnea on exertion.  Will arrange follow-up echocardiogram.  This will assess both his prosthetic aortic valve and severity of mitral regurgitation.  3 coronary artery disease-patient denies chest pain.  Continue statin.  He is not on aspirin given need for anticoagulation.  4 hypertension-blood pressure elevated.  However he states controlled at home and we are increasing diuretic.  Will follow.  5 chronic diastolic congestive heart failure-patient notes increased pedal edema.  Will increase Lasix to 40 mg twice daily.  Check potassium and renal function in 1 week.  6 hyperlipidemia-continue statin.  Check lipids.  7 chronic stage IIIa kidney disease-check potassium and renal function 1 week after increasing diuretic.  Kirk Ruths, MD

## 2022-12-24 ENCOUNTER — Ambulatory Visit: Payer: Medicare Other | Attending: Cardiology | Admitting: Cardiology

## 2022-12-24 ENCOUNTER — Encounter: Payer: Self-pay | Admitting: Cardiology

## 2022-12-24 ENCOUNTER — Other Ambulatory Visit (HOSPITAL_BASED_OUTPATIENT_CLINIC_OR_DEPARTMENT_OTHER): Payer: Self-pay

## 2022-12-24 VITALS — BP 150/60 | HR 66 | Ht 69.0 in | Wt 166.0 lb

## 2022-12-24 DIAGNOSIS — Z952 Presence of prosthetic heart valve: Secondary | ICD-10-CM | POA: Diagnosis not present

## 2022-12-24 DIAGNOSIS — I10 Essential (primary) hypertension: Secondary | ICD-10-CM | POA: Diagnosis not present

## 2022-12-24 DIAGNOSIS — I480 Paroxysmal atrial fibrillation: Secondary | ICD-10-CM | POA: Diagnosis not present

## 2022-12-24 DIAGNOSIS — Z951 Presence of aortocoronary bypass graft: Secondary | ICD-10-CM | POA: Diagnosis not present

## 2022-12-24 DIAGNOSIS — R0609 Other forms of dyspnea: Secondary | ICD-10-CM | POA: Diagnosis not present

## 2022-12-24 DIAGNOSIS — E785 Hyperlipidemia, unspecified: Secondary | ICD-10-CM | POA: Diagnosis not present

## 2022-12-24 MED ORDER — FUROSEMIDE 20 MG PO TABS
40.0000 mg | ORAL_TABLET | Freq: Two times a day (BID) | ORAL | 3 refills | Status: DC
  Filled 2022-12-24: qty 360, 90d supply, fill #0

## 2022-12-24 NOTE — Patient Instructions (Signed)
Medication Instructions:   INCREASE FUROSEMIDE TO 40 MG TWICE DAILY= 2 OF THE 20 MG TABLETS TWICE DAILY  *If you need a refill on your cardiac medications before your next appointment, please call your pharmacy*   Lab Work:  Your physician recommends that you return for lab work in: [facility]  [place] on the 2 nd floor in [address] Hours- Mon-Thur 8 am-11:30 am and 1 pm - 4:30 pm             Fri-8am - 11 am  If you have labs (blood work) drawn today and your tests are completely normal, you will receive your results only by: [employer] (if you have MyChart) OR A paper copy in the mail If you have any lab test that is abnormal or we need to change your treatment, we will call you to review the results.   Testing/Procedures:  Your physician has requested that you have an echocardiogram. Echocardiography is a painless test that uses sound waves to create images of your heart. It provides your doctor with information about the size and shape of your heart and how well your heart's chambers and valves are working. This procedure takes approximately one hour. There are no restrictions for this procedure. Please do NOT wear cologne, perfume, aftershave, or lotions (deodorant is allowed). Please arrive 15 minutes prior to your appointment time. [facility]  A chest x-ray takes a picture of the organs and structures inside the chest, including the heart, lungs, and blood vessels. This test can show several things, including, whether the heart is enlarges; whether fluid is building up in the lungs; and whether pacemaker / defibrillator leads are still in place. [facility] [place]   Follow-Up: At [facility], you and your health needs are our priority.  As part of our continuing mission to provide you with exceptional heart care, we have created designated Provider Care Teams.  These Care Teams include your primary Cardiologist (physician)  and Advanced Practice Providers (APPs -  Physician Assistants and Nurse Practitioners) who all work together to provide you with the care you need, when you need it.  We recommend signing up for the patient portal called "MyChart".  Sign up information is provided on this After Visit Summary.  MyChart is used to connect with patients for Virtual Visits (Telemedicine).  Patients are able to view lab/test results, encounter notes, upcoming appointments, etc.  Non-urgent messages can be sent to your provider as well.   To learn more about what you can do with MyChart, go to [email or web address].    Your next appointment:   6-8 week(s)  Provider:   ANY APP    Your physician recommends that you schedule a follow-up appointment in: [place]

## 2022-12-31 ENCOUNTER — Ambulatory Visit (HOSPITAL_BASED_OUTPATIENT_CLINIC_OR_DEPARTMENT_OTHER)
Admission: RE | Admit: 2022-12-31 | Discharge: 2022-12-31 | Disposition: A | Discharge status: Home / Self Care | Payer: Medicare Other | Source: Ambulatory Visit | Attending: Cardiology | Admitting: Cardiology

## 2022-12-31 DIAGNOSIS — J90 Pleural effusion, not elsewhere classified: Secondary | ICD-10-CM | POA: Diagnosis not present

## 2022-12-31 DIAGNOSIS — I480 Paroxysmal atrial fibrillation: Secondary | ICD-10-CM | POA: Diagnosis not present

## 2022-12-31 DIAGNOSIS — E785 Hyperlipidemia, unspecified: Secondary | ICD-10-CM | POA: Diagnosis not present

## 2023-01-01 ENCOUNTER — Other Ambulatory Visit: Payer: Self-pay | Admitting: *Deleted

## 2023-01-01 DIAGNOSIS — R7989 Other specified abnormal findings of blood chemistry: Secondary | ICD-10-CM

## 2023-01-01 LAB — LIPID PANEL
Chol/HDL Ratio: 2.5 ratio (ref 0.0–5.0)
Cholesterol, Total: 121 mg/dL (ref 100–199)
HDL: 49 mg/dL (ref >39)
LDL Chol Calc (NIH): 60 mg/dL (ref 0–99)
Triglycerides: 51 mg/dL (ref 0–149)
VLDL Cholesterol Cal: 12 mg/dL (ref 5–40)

## 2023-01-01 LAB — CBC
Hematocrit: 34.7 % — ABNORMAL LOW (ref 37.5–51.0)
Hemoglobin: 11.6 g/dL — ABNORMAL LOW (ref 13.0–17.7)
MCH: 30.9 pg (ref 26.6–33.0)
MCHC: 33.4 g/dL (ref 31.5–35.7)
MCV: 93 fL (ref 79–97)
Platelets: 198 10*3/uL (ref 150–450)
RBC: 3.75 x10E6/uL — ABNORMAL LOW (ref 4.14–5.80)
RDW: 13.1 % (ref 11.6–15.4)
WBC: 9.9 10*3/uL (ref 3.4–10.8)

## 2023-01-01 LAB — TSH: TSH: 8.6 u[IU]/mL — ABNORMAL HIGH (ref 0.450–4.500)

## 2023-01-01 LAB — BASIC METABOLIC PANEL
BUN/Creatinine Ratio: 23 (ref 10–24)
BUN: 38 mg/dL — ABNORMAL HIGH (ref 8–27)
CO2: 23 mmol/L (ref 20–29)
Calcium: 9.1 mg/dL (ref 8.6–10.2)
Chloride: 103 mmol/L (ref 96–106)
Creatinine, Ser: 1.67 mg/dL — ABNORMAL HIGH (ref 0.76–1.27)
Glucose: 71 mg/dL (ref 70–99)
Potassium: 4.3 mmol/L (ref 3.5–5.2)
Sodium: 141 mmol/L (ref 134–144)
eGFR: 39 mL/min/{1.73_m2} — ABNORMAL LOW (ref >59)

## 2023-01-02 ENCOUNTER — Other Ambulatory Visit (HOSPITAL_BASED_OUTPATIENT_CLINIC_OR_DEPARTMENT_OTHER): Payer: Medicare Other

## 2023-01-08 ENCOUNTER — Ambulatory Visit (HOSPITAL_BASED_OUTPATIENT_CLINIC_OR_DEPARTMENT_OTHER)
Admission: RE | Admit: 2023-01-08 | Discharge: 2023-01-08 | Disposition: A | Discharge status: Home / Self Care | Payer: Medicare Other | Source: Ambulatory Visit | Attending: Cardiology | Admitting: Cardiology

## 2023-01-08 DIAGNOSIS — R0609 Other forms of dyspnea: Secondary | ICD-10-CM | POA: Diagnosis not present

## 2023-01-08 DIAGNOSIS — Z952 Presence of prosthetic heart valve: Secondary | ICD-10-CM

## 2023-01-08 DIAGNOSIS — R600 Localized edema: Secondary | ICD-10-CM

## 2023-01-08 LAB — ECHOCARDIOGRAM COMPLETE
AR max vel: 0.88 cm2
AV Area VTI: 0.94 cm2
AV Area mean vel: 0.9 cm2
AV Mean grad: 12.7 mmHg
AV Peak grad: 24.8 mmHg
Ao pk vel: 2.49 m/s
Area-P 1/2: 4.04 cm2
Calc EF: 58.4 %
MV M vel: 5.21 m/s
MV Peak grad: 108.6 mmHg
S' Lateral: 3.6 cm
Single Plane A2C EF: 60.8 %
Single Plane A4C EF: 51.2 %

## 2023-01-17 ENCOUNTER — Telehealth: Payer: Self-pay | Admitting: Cardiology

## 2023-01-17 DIAGNOSIS — R7989 Other specified abnormal findings of blood chemistry: Secondary | ICD-10-CM | POA: Diagnosis not present

## 2023-01-17 DIAGNOSIS — I480 Paroxysmal atrial fibrillation: Secondary | ICD-10-CM | POA: Diagnosis not present

## 2023-01-17 NOTE — Telephone Encounter (Signed)
Pt is asking if his scheduled office visit on 3/6 with APP be changed to virtual appointment. Will forward to APP and MA.

## 2023-01-17 NOTE — Telephone Encounter (Signed)
Patient calling to see if hi sappt on 3/6 can be virtual. Please advise

## 2023-01-18 LAB — TSH+FREE T4
Free T4: 1.24 ng/dL (ref 0.82–1.77)
TSH: 6.46 u[IU]/mL — ABNORMAL HIGH (ref 0.450–4.500)

## 2023-01-18 NOTE — Telephone Encounter (Signed)
Sorry Denyse Amass, not sure why this routed to you.   Routing to correct place.  Thanks!

## 2023-01-21 NOTE — Telephone Encounter (Signed)
Patient made aware of change to virtual visit.

## 2023-01-21 NOTE — Telephone Encounter (Signed)
Appointment changed to virtual. 

## 2023-01-23 ENCOUNTER — Encounter: Payer: Self-pay | Admitting: Nurse Practitioner

## 2023-01-23 ENCOUNTER — Ambulatory Visit: Payer: Medicare Other | Attending: Nurse Practitioner | Admitting: Nurse Practitioner

## 2023-01-23 VITALS — BP 139/52 | HR 58 | Ht 69.0 in | Wt 165.0 lb

## 2023-01-23 DIAGNOSIS — I350 Nonrheumatic aortic (valve) stenosis: Secondary | ICD-10-CM | POA: Diagnosis not present

## 2023-01-23 DIAGNOSIS — Z952 Presence of prosthetic heart valve: Secondary | ICD-10-CM

## 2023-01-23 DIAGNOSIS — E785 Hyperlipidemia, unspecified: Secondary | ICD-10-CM | POA: Diagnosis not present

## 2023-01-23 DIAGNOSIS — I5032 Chronic diastolic (congestive) heart failure: Secondary | ICD-10-CM | POA: Diagnosis not present

## 2023-01-23 DIAGNOSIS — I2510 Atherosclerotic heart disease of native coronary artery without angina pectoris: Secondary | ICD-10-CM | POA: Diagnosis not present

## 2023-01-23 DIAGNOSIS — I340 Nonrheumatic mitral (valve) insufficiency: Secondary | ICD-10-CM

## 2023-01-23 DIAGNOSIS — R7989 Other specified abnormal findings of blood chemistry: Secondary | ICD-10-CM

## 2023-01-23 DIAGNOSIS — I480 Paroxysmal atrial fibrillation: Secondary | ICD-10-CM | POA: Diagnosis not present

## 2023-01-23 DIAGNOSIS — I10 Essential (primary) hypertension: Secondary | ICD-10-CM

## 2023-01-23 DIAGNOSIS — N182 Chronic kidney disease, stage 2 (mild): Secondary | ICD-10-CM

## 2023-01-23 NOTE — Patient Instructions (Addendum)
Medication Instructions:  Your physician recommends that you continue on your current medications as directed. Please refer to the Current Medication list given to you today.   *If you need a refill on your cardiac medications before your next appointment, please call your pharmacy*   Lab Work: NONE ordered at this time of appointment   If you have labs (blood work) drawn today and your tests are completely normal, you will receive your results only by: [employer] (if you have MyChart) OR A paper copy in the mail If you have any lab test that is abnormal or we need to change your treatment, we will call you to review the results.   Testing/Procedures: NONE ordered at this time of appointment     Follow-Up: At [facility], you and your health needs are our priority.  As part of our continuing mission to provide you with exceptional heart care, we have created designated Provider Care Teams.  These Care Teams include your primary Cardiologist (physician) and Advanced Practice Providers (APPs -  Physician Assistants and Nurse Practitioners) who all work together to provide you with the care you need, when you need it.  We recommend signing up for the patient portal called "MyChart".  Sign up information is provided on this After Visit Summary.  MyChart is used to connect with patients for Virtual Visits (Telemedicine).  Patients are able to view lab/test results, encounter notes, upcoming appointments, etc.  Non-urgent messages can be sent to your provider as well.   To learn more about what you can do with MyChart, go to [email or web address].    Your next appointment:    Keep June 12, 2023 appointment   Provider:   Kirk Ruths, MD     Other Instructions

## 2023-01-23 NOTE — Progress Notes (Signed)
Virtual Visit via Telephone Note   Because of Peter Scott's co-morbid illnesses, he is at least at moderate risk for complications without adequate follow up.  This format is felt to be most appropriate for this patient at this time.  The patient did not have access to video technology/had technical difficulties with video requiring transitioning to audio format only (telephone).  All issues noted in this document were discussed and addressed.  No physical exam could be performed with this format.  Please refer to the patient's chart for his consent to telehealth for [facility]. Evaluation Performed:  Follow-up visit  This visit type was conducted due to national recommendations for restrictions regarding the COVID-19 Pandemic (e.g. social distancing).  This format is felt to be most appropriate for this patient at this time.  All issues noted in this document were discussed and addressed.  No physical exam was performed (except for noted visual exam findings with Video Visits).  Please refer to the patient's chart (MyChart message for video visits and phone note for telephone visits) for the patient's consent to telehealth for [facility]. _____________   Date:  01/23/2023   Patient ID:  Peter Scott, DOB August 22, 1932, MRN [id number] Patient Location:  Home Provider location:   Office  Primary Care Provider:  [name], MD Primary Cardiologist:  Kirk Ruths, MD  Chief Complaint    89 year old male with a history of CAD s/p CABG in [place] in 2001, paroxysmal atrial fibrillation, SVT s/p ablation, aortic stenosis s/p TAVR in 2019, mitral valve regurgitation, chronic diastolic heart failure, hypertension, hyperlipidemia, and CKD stage IIIA who presents for follow-up related to heart failure, hypertension, and valvular heart disease.  Past Medical History    Past Medical History:  Diagnosis Date   Aortic aneurysm (HCC)    Aortic stenosis    CAD (coronary artery  disease)    Diabetes mellitus type II, controlled ([place])    Heart disease    Ablation   History of chicken pox    Hyperlipidemia    Hypertension    S/P CABG x 4    2001 - [place]   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   26 mm Edwards Sapien 3 transcatheter heart valve placed via percutaneous right transfemoral approach    Umbilical hernia    Past Surgical History:  Procedure Laterality Date   ABLATION     Rhythm problem; rhythm unknown; [place]   CARDIOVERSION N/A 07/14/2018   Procedure: CARDIOVERSION;  Surgeon: Dorothy Spark, MD;  Location: [place];  Service: Cardiovascular;  Laterality: N/A;   CARDIOVERSION N/A 08/27/2018   Procedure: CARDIOVERSION;  Surgeon: Jerline Pain, MD;  Location: [place] [facility];  Service: Cardiovascular;  Laterality: N/A;   CORONARY ARTERY BYPASS GRAFT  2001   EXPLORATION POST OPERATIVE OPEN HEART  2001, June   INTRAOPERATIVE TRANSTHORACIC ECHOCARDIOGRAM N/A 04/01/2018   Procedure: INTRAOPERATIVE TRANSTHORACIC ECHOCARDIOGRAM;  Surgeon: Burnell Blanks, MD;  Location: [place];  Service: Open Heart Surgery;  Laterality: N/A;   RIGHT/LEFT HEART CATH AND CORONARY/GRAFT ANGIOGRAPHY N/A 01/31/2018   Procedure: RIGHT/LEFT HEART CATH AND CORONARY/GRAFT ANGIOGRAPHY;  Surgeon: Burnell Blanks, MD;  Location: [place] CV [facility];  Service: Cardiovascular;  Laterality: N/A;   TEE WITHOUT CARDIOVERSION N/A 01/17/2018   Procedure: TRANSESOPHAGEAL ECHOCARDIOGRAM (TEE);  Surgeon: Lelon Perla, MD;  Location: [facility];  Service: Cardiovascular;  Laterality: N/A;   TEE WITHOUT CARDIOVERSION N/A 07/14/2018   Procedure: TRANSESOPHAGEAL ECHOCARDIOGRAM (TEE);  Surgeon: Dorothy Spark, MD;  Location: [place];  Service: Cardiovascular;  Laterality: N/A;   TONSILLECTOMY     TRANSCATHETER AORTIC VALVE REPLACEMENT, TRANSFEMORAL N/A 04/01/2018   Procedure: TRANSCATHETER AORTIC VALVE REPLACEMENT, TRANSFEMORAL;  Surgeon:  Burnell Blanks, MD;  Location: [place];  Service: Open Heart Surgery;  Laterality: N/A;   UMBILICAL HERNIA REPAIR  2015    Allergies  Allergies  Allergen Reactions   Glipizide Palpitations    "Heart racing" "Heart racing"    History of Present Illness    Peter Scott is a 89 y.o. male who presents via audio/video conferencing for a telehealth visit today.    He has a history of CAD s/p CABG in [place] in 2001, paroxysmal atrial fibrillation on amiodarone and Eliquis, SVT s/p ablation, aortic stenosis s/p TAVR in 2019, mitral valve regurgitation, chronic diastolic heart failure, hypertension, hyperlipidemia, and CKD stage III A.  Carotid Dopplers in 2019 showed 1 to 39% B ICA stenosis.  Preoperative cardiac catheterization in 2019 revealed severe native three-vessel CAD with patent grafts.  Echocardiogram in May 2023 showed EF 50 to 55%, mild LVH, G2 DD, severe left atrial enlargement, moderate mitral valve regurgitation, stable TAVR, mean gradient 14 mmHg, trace aortic insufficiency.  He was last seen in the office on 12/24/2022 and noted a several week history of mild increased dyspnea, increased pedal edema, productive cough.  His Lasix was increased to 40 mg twice daily.  CBC, BMET and chest x-ray were stable.  TSH was elevated.  Repeat TSH, T4 was stable, no medication changes were advised. Repeat echocardiogram showed EF 60 to 65%, normal LV function, no RWMA, indeterminate diastolic parameters, normal RV systolic function, moderate mitral valve regurgitation, mild aortic valve regurgitation, stable TAVR, gradient 12.7 mmHg.  He presents today for follow-up.  Since his last visit he has done well from a cardiac standpoint.  He notes some mild dependent bilateral lower extremity edema that is gone when he wakes up in the morning.  He denies chest pain, palpitations, dizziness, presyncope, syncope, dyspnea, PND, orthopnea, weight gain.  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  Overall, he reports feeling well.  The patient does not have symptoms concerning for COVID-19 infection (fever, chills, cough, or new shortness of breath).   Home Medications    Prior to Admission medications   Medication Sig Start Date End Date Taking? Authorizing Provider  acetaminophen (TYLENOL) 500 MG tablet Take 500 mg by mouth every 6 (six) hours as needed for mild pain or moderate pain.    [provider]  amiodarone (PACERONE) 200 MG tablet Take 1 tablet (200 mg total) by mouth daily. 08/13/18   Lelon Perla, MD  amLODipine (NORVASC) 10 MG tablet Take 10 mg by mouth daily.    [provider]  amoxicillin (AMOXIL) 500 MG capsule Take 4 capsules (2,000 mg total) by mouth one hour prior to dental procedure. 09/25/22     apixaban (ELIQUIS) 2.5 MG TABS tablet Take 1 tablet (2.5 mg total) by mouth 2 (two) times daily. 01/28/20   Lelon Perla, MD  atorvastatin (LIPITOR) 40 MG tablet Take 40 mg by mouth daily.    [provider]  Blood Glucose Monitoring Suppl (CONTOUR NEXT EZ) w/Device KIT 1 kit by Does not apply route daily. Use once a day to check blood sugar.  Dx code: E11.9 09/03/19   Copland, Gay Filler, MD  Cholecalciferol 25 MCG (1000 UT) tablet TAKE  ONE TABLET BY MOUTH DAILY FOR LOW VITAMIN D LEVEL 06/29/21   [provider]  COVID-19 mRNA bivalent vaccine, Moderna, (MODERNA COVID-19 BIVAL BOOSTER) 50 MCG/0.5ML injection Inject into the muscle. 08/18/21   Carlyle Basques, MD  ferrous sulfate 325 (65 FE) MG tablet Take 325 mg by mouth daily with breakfast.    [provider]  furosemide (LASIX) 20 MG tablet Take 2 tablets (40 mg total) by mouth 2 (two) times daily. 12/24/22   Lelon Perla, MD  glucose blood (CONTOUR NEXT TEST) test strip Use once a day to check blood sugar.  Dx code: E11.9 02/08/21   Copland, Gay Filler, MD  insulin glargine-yfgn (SEMGLEE) 100 UNIT/ML Pen 18 Units. 08/21/21   [provider]   Insulin Pen Needle (BD PEN NEEDLE NANO U/F) 32G X 4 MM MISC by Does not apply route. 07/10/21   [provider]  losartan (COZAAR) 100 MG tablet Take 0.5 tablets (50 mg total) by mouth 2 (two) times daily. 08/05/18   Copland, Gay Filler, MD    Review of Systems    He denies chest pain, palpitations, dyspnea, pnd, orthopnea, n, v, dizziness, syncope, weight gain, or early satiety. All other systems reviewed and are otherwise negative except as noted above.  All other systems reviewed and are otherwise negative except as noted above.  Physical Exam    Vital Signs:  BP (!) 139/52   Pulse (!) 58   Ht '5\' 9"'$  (1.753 m)   Wt 165 lb (74.8 kg)   BMI 24.37 kg/m    Well nourished, well developed male in no acute distress.   Accessory Clinical Findings    None  Assessment & Plan    1. Chronic diastolic heart failure: Most recent echo in 12/2022 showed EF 60 to 65%, normal LV function, no RWMA, indeterminate diastolic parameters, normal RV systolic function, moderate mitral valve regurgitation, mild aortic valve regurgitation, stable TAVR, mean gradient 12.7 mmHg.  At his last visit he noted increased pedal edema, shortness of breath.  He continues to note dependent bilateral pedal edema, that improves with compression and elevation, his dyspnea has resolved with increased Lasix dosing.  Renal function stable.  Continue Lasix at current dose.  2. Paroxysmal atrial fibrillation: He was maintaining NSR at most recent OV.  Denies any recent palpitations.  Recent chest x-ray, liver function, CBC, BMET, and thyroid function stable.  Continue amiodarone, Eliquis.  3. CAD: S/p CABG in 2001.  Stable with no anginal symptoms.  Medication for ischemic evaluation.  Continue statin.  Not on aspirin given chronic DOAC therapy.  4. Aortic stenosis: Most recent echo in 12/2022 showed stable TAVR, mean gradient 12.7 mmHg.  Plan for repeat echo in 1 year.   5. Mitral valve regurgitation: Most recent echo  showed stable moderate mitral valve regurgitation.  Will continue to monitor with routine repeat echo.  6. Hypertension: BP well controlled. Continue current antihypertensive regimen.   7. Hyperlipidemia: LDL was 60 in 12/2022.  Continue atorvastatin.  8. CKD stage II-III: Creatinine was stable at 1.67 on 12/31/2022.  9. Disposition: Follow-up as scheduled with Dr. [name] in 05/2023.   COVID-19 Education: The signs and symptoms of COVID-19 were discussed with the patient and how to seek care for testing (follow up with PCP or arrange E-visit).  The importance of social distancing was discussed today.  Patient Risk:   After full review of this patient's history and clinical status, I feel that he is at least moderate risk  for cardiac complications at this time, thus necessitating a telehealth visit sooner than our first available in office visit.  Time:   Today, I have spent 15 minutes with the patient with telehealth technology discussing medical history, symptoms, and management plan.     Lenna Sciara, NP 01/23/2023, 9:56 AM

## 2023-04-08 DIAGNOSIS — C44319 Basal cell carcinoma of skin of other parts of face: Secondary | ICD-10-CM | POA: Diagnosis not present

## 2023-04-09 IMAGING — DX DG CHEST 2V
2 series · 2 of 2 positions shown · non-contrast
Comparison: August 24, 2020

CLINICAL DATA: Amiodarone surveillance (on meds since 0470).

EXAM:
CHEST - 2 VIEW

[chest pa]
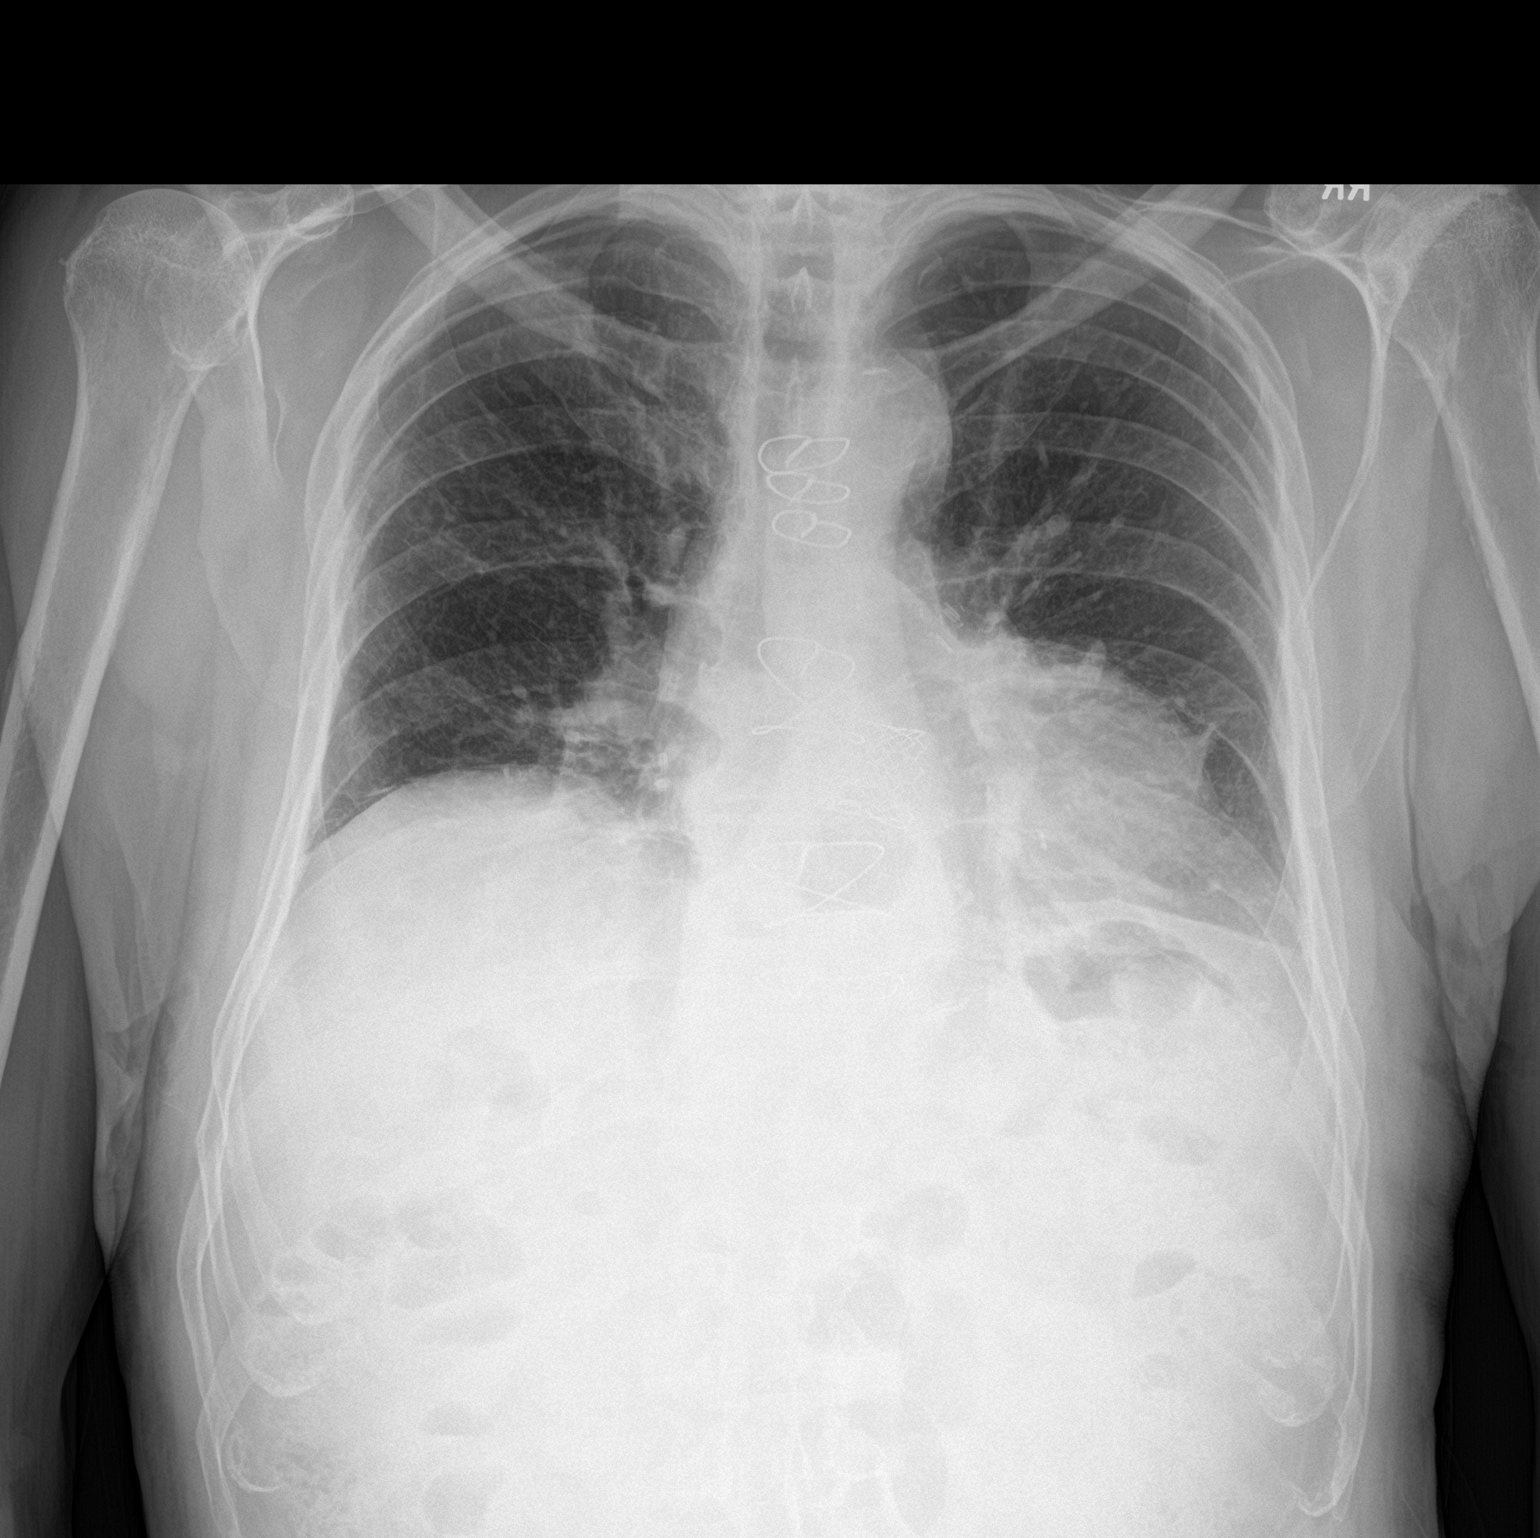

[chest lat]
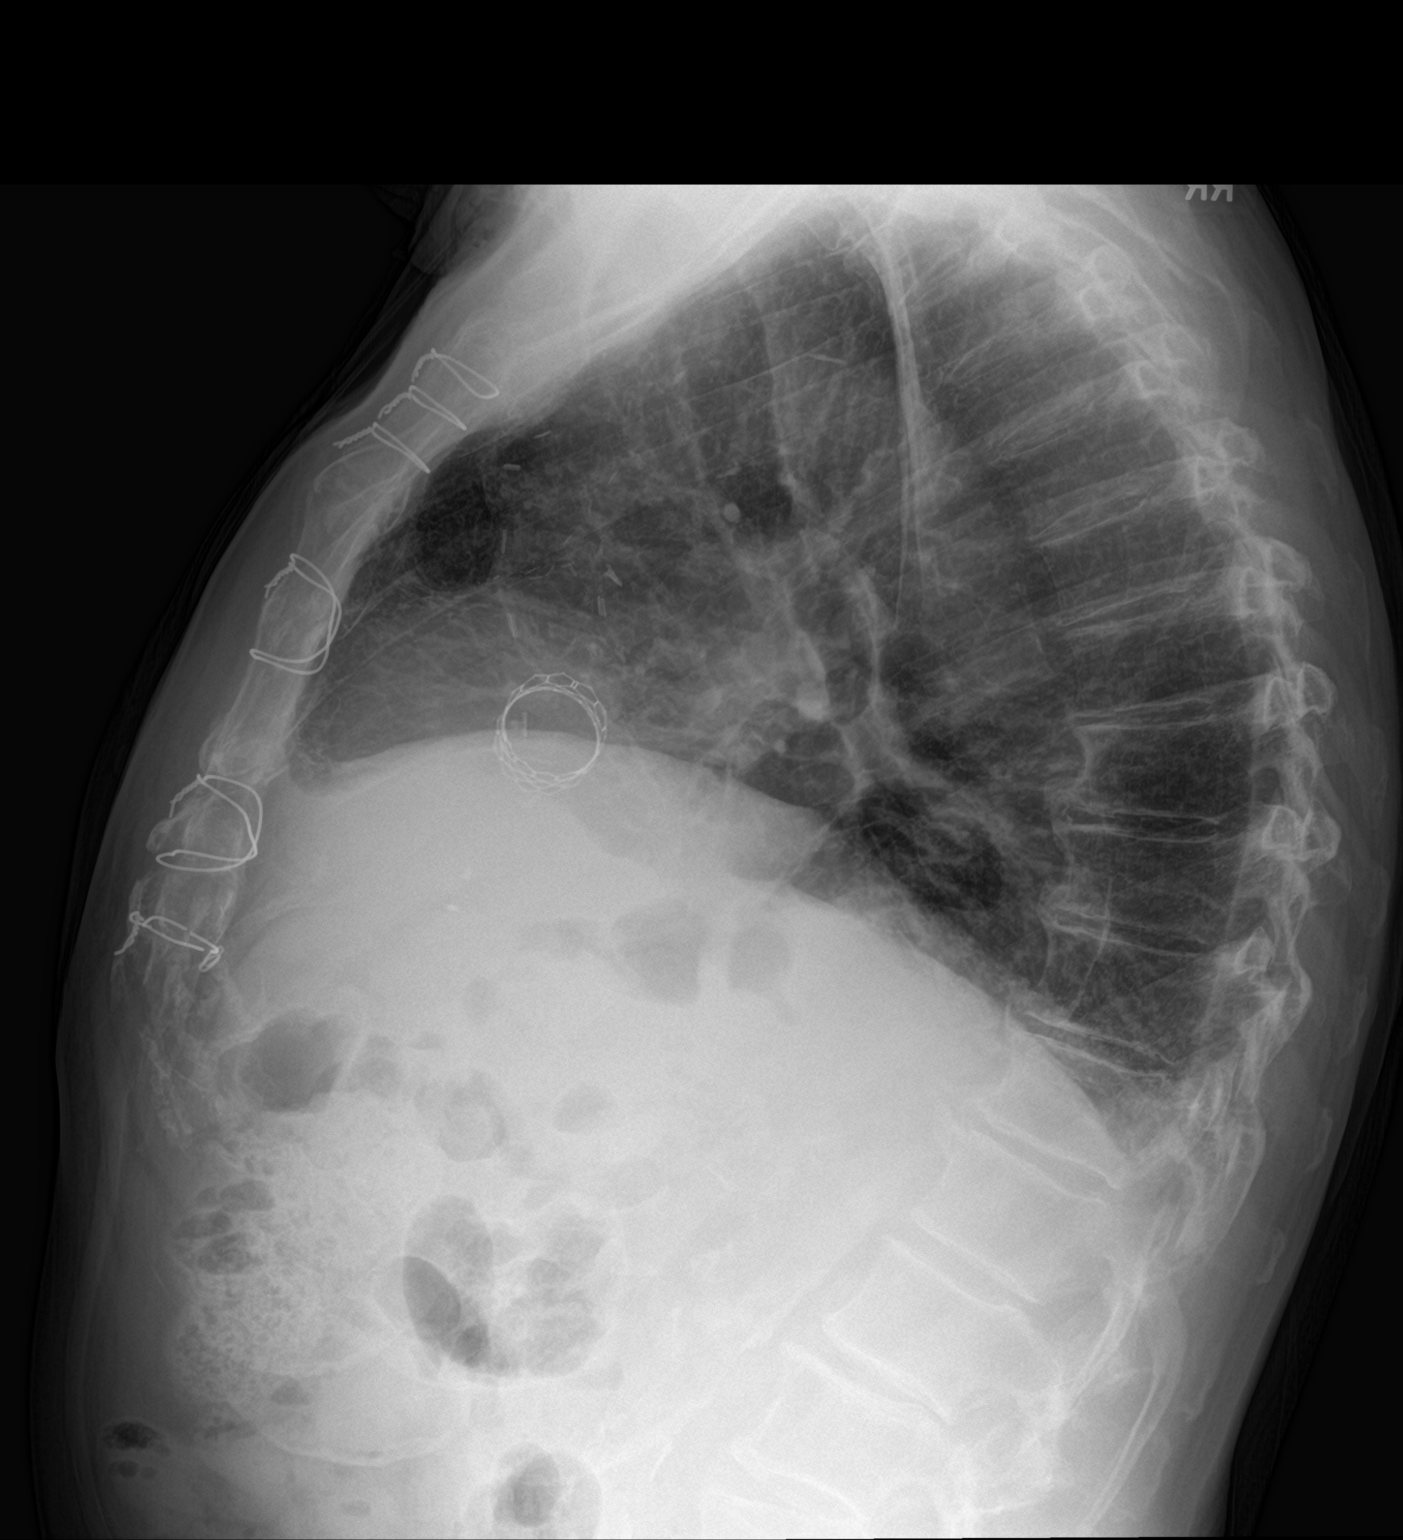

[2 of 2 positions shown; findings below may reference images not displayed]

FINDINGS: Multiple sternal wires are seen. Low lung volumes are noted. Mild,
stable areas of atelectasis are seen within the bilateral lung
bases. There is no evidence of acute infiltrate, pleural effusion or
pneumothorax. The heart size and mediastinal contours are within
normal limits. An artificial aortic valve is noted. The visualized
skeletal structures are unremarkable.
IMPRESSION: Stable exam without acute cardiopulmonary disease.

## 2023-06-03 NOTE — Progress Notes (Signed)
HPI: FU CAD and AVR. Patient is status post coronary artery bypass graft in [place] in 2001. He also had ablation of what sounds to be SVT at that time. Carotid Dopplers March 2019 showed 1 to 39% bilateral stenosis. He underwent TAVR 5/19. Patient had preoperative cardiac catheterization that revealed severe native three-vessel coronary artery disease with patency of grafts.  Also with h/o PAF. Abd ultrasound 2/21 showed no AAA. Echocardiogram February 2024 showed normal LV function, severe left atrial enlargement, moderate mitral regurgitation, status post aortic valve replacement with mild aortic insufficiency, mean gradient 14 mmHg.  Since last seen, the patient has dyspnea with more extreme activities but not with routine activities. It is relieved with rest. It is not associated with chest pain. There is no orthopnea, PND or pedal edema. There is no syncope or palpitations. There is no exertional chest pain.   Current Outpatient Medications  Medication Sig Dispense Refill   acetaminophen (TYLENOL) 500 MG tablet Take 500 mg by mouth every 6 (six) hours as needed for mild pain or moderate pain.     amiodarone (PACERONE) 200 MG tablet Take 1 tablet (200 mg total) by mouth daily. 90 tablet 3   amLODipine (NORVASC) 10 MG tablet Take 10 mg by mouth daily.     amoxicillin (AMOXIL) 500 MG capsule Take 4 capsules (2,000 mg total) by mouth one hour prior to dental procedure. 20 capsule 3   apixaban (ELIQUIS) 2.5 MG TABS tablet Take 1 tablet (2.5 mg total) by mouth 2 (two) times daily.     atorvastatin (LIPITOR) 40 MG tablet Take 40 mg by mouth daily.     Blood Glucose Monitoring Suppl (CONTOUR NEXT EZ) w/Device KIT 1 kit by Does not apply route daily. Use once a day to check blood sugar.  Dx code: E11.9 1 kit 0   COVID-19 mRNA bivalent vaccine, Moderna, (MODERNA COVID-19 BIVAL BOOSTER) 50 MCG/0.5ML injection Inject into the muscle. 0.5 mL 0   ferrous sulfate 325 (65 FE) MG tablet Take 325 mg by  mouth daily with breakfast.     furosemide (LASIX) 20 MG tablet Take 2 tablets (40 mg total) by mouth 2 (two) times daily. 360 tablet 3   glucose blood (CONTOUR NEXT TEST) test strip Use once a day to check blood sugar.  Dx code: E11.9 100 each 12   insulin glargine-yfgn (SEMGLEE) 100 UNIT/ML Pen 18 Units.     Insulin Pen Needle (BD PEN NEEDLE NANO U/F) 32G X 4 MM MISC by Does not apply route.     losartan (COZAAR) 100 MG tablet Take 0.5 tablets (50 mg total) by mouth 2 (two) times daily. 90 tablet 1   Cholecalciferol 25 MCG (1000 UT) tablet TAKE ONE TABLET BY MOUTH DAILY FOR LOW VITAMIN D LEVEL (Patient not taking: Reported on 01/23/2023)     No current facility-administered medications for this visit.     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   26 mm Edwards Sapien 3 transcatheter heart valve placed via percutaneous right transfemoral approach    Umbilical hernia  Past Surgical History:  Procedure Laterality Date   ABLATION     Rhythm problem; rhythm unknown; [place]   CARDIOVERSION N/A 07/14/2018   Procedure: CARDIOVERSION;  Surgeon: Lars Masson, MD;  Location[facility] ENDOSCOPY;  Service: Cardiovascular;  Laterality: N/A;   CARDIOVERSION N/A 08/27/2018   Procedure: CARDIOVERSION;  Surgeon: Jake Bathe, MD;  Location: [facility] ENDOSCOPY;  Service: Cardiovascular;  Laterality: N/A;   CORONARY ARTERY BYPASS GRAFT  2001   EXPLORATION POST OPERATIVE OPEN HEART  2001, June   INTRAOPERATIVE TRANSTHORACIC ECHOCARDIOGRAM N/A 04/01/2018   Procedure: INTRAOPERATIVE TRANSTHORACIC ECHOCARDIOGRAM;  Surgeon: Kathleene Hazel, MD;  Location: [facility] OR;  Service: Open Heart  Surgery;  Laterality: N/A;   RIGHT/LEFT HEART CATH AND CORONARY/GRAFT ANGIOGRAPHY N/A 01/31/2018   Procedure: RIGHT/LEFT HEART CATH AND CORONARY/GRAFT ANGIOGRAPHY;  Surgeon: Kathleene Hazel, MD;  Location: [facility] INVASIVE CV LAB;  Service: Cardiovascular;  Laterality: N/A;   TEE WITHOUT CARDIOVERSION N/A 01/17/2018   Procedure: TRANSESOPHAGEAL ECHOCARDIOGRAM (TEE);  Surgeon: Lewayne Bunting, MD;  Location: [facility] ENDOSCOPY;  Service: Cardiovascular;  Laterality: N/A;   TEE WITHOUT CARDIOVERSION N/A 07/14/2018   Procedure: TRANSESOPHAGEAL ECHOCARDIOGRAM (TEE);  Surgeon: Lars Masson, MD;  Location: [facility] ENDOSCOPY;  Service: Cardiovascular;  Laterality: N/A;   TONSILLECTOMY     TRANSCATHETER AORTIC VALVE REPLACEMENT, TRANSFEMORAL N/A 04/01/2018   Procedure: TRANSCATHETER AORTIC VALVE REPLACEMENT, TRANSFEMORAL;  Surgeon: Kathleene Hazel, MD;  Location: [facility] OR;  Service: Open Heart Surgery;  Laterality: N/A;   UMBILICAL HERNIA REPAIR  2015    Social History   Socioeconomic History   Marital status: Married    Spouse name: Not on file   Number of children: 2   Years of education: Not on file   Highest education level: Not on file  Occupational History   Not on file  Tobacco Use   Smoking status: Former    Current packs/day: 1.00    Average packs/day: 1 pack/day for 3.0 years (3.0 ttl pk-yrs)    Types: Cigarettes   Smokeless tobacco: Never   Tobacco comments:    quit tobbaco in his 57s  Vaping Use   Vaping status: Never Used  Substance and Sexual Activity   Alcohol use: No    Alcohol/week: 0.0 standard drinks of alcohol   Drug use: No   Sexual activity: Not on file  Other Topics Concern   Not on file  Social History Narrative   Not on file   Social Determinants of Health   Financial Resource Strain: Low Risk  (09/08/2021)   Received [facility]   Overall Financial Resource Strain (CARDIA)    Difficulty of Paying Living Expenses: Not very hard  Food  Insecurity: No Food Insecurity (09/08/2021)   Received from [facility]   Hunger Vital Sign    Worried About Running Out of Food in the Last Year: Never true    Ran Out of Food in the Last Year: Never true  Transportation Needs: No Transportation Needs (09/08/2021)   Received from [facility] - Transportation    Lack of Transportation (Medical): No    Lack of Transportation (Non-Medical): No  Physical Activity: Sufficiently Active (09/08/2021)   Received from [facility]   Exercise Vital Sign    Days of Exercise per Week: 3 days    Minutes of Exercise per Session: 60 min  Stress: No Stress Concern Present (09/08/2021)   Received from [facility] - Occupational  Stress Questionnaire    Feeling of Stress : Not at all  Social Connections: Unknown (03/22/2022)   Received from [facility]   Social Network    Social Network: Not on file  Intimate Partner Violence: Unknown (02/23/2022)   Received from [facility]   HITS    Physically Hurt: Not on file    Insult or Talk Down To: Not on file    Threaten Physical Harm: Not on file    Scream or Curse: Not on file    Family History  Problem Relation Age of Onset   Diabetes Mother 25       Deceased   Heart disease Father 76       Deceased   Heart disease Maternal Uncle    Diabetes Maternal Uncle    Heart disease Brother    Diabetes Brother    Diabetes Sister        #1   Heart disease Sister        #2   Hyperlipidemia Son        x2    ROS: no fevers or chills, productive cough, hemoptysis, dysphasia, odynophagia, melena, hematochezia, dysuria, hematuria, rash, seizure activity, orthopnea, PND, claudication. Remaining systems are negative.  Physical Exam: Well-developed well-nourished in no acute distress.  Skin is warm and dry.  HEENT is normal.  Neck is supple.  Chest is clear to auscultation with normal expansion. 2/6 systolic murmur Cardiovascular exam is  regular rate and rhythm.  Abdominal exam nontender or distended. No masses palpated. Extremities show trace to 1+ edema. neuro grossly intact  A/P  1 paroxysmal atrial fibrillation-patient is in sinus rhythm.  Continue amiodarone and apixaban.  Check TSH, free T4, liver functions, hemoglobin and renal function.  2 status post TAVR-most recent echocardiogram showed mild aortic insufficiency otherwise normally functioning valve.  Continue SBE prophylaxis.  3 mitral regurgitation-moderate on most recent echocardiogram.  Will need follow-up study February 2025.  4 Coronary artery disease-continue statin.  No aspirin given need for coagulation.  5 Hypertension-patient's blood pressure is controlled.  Continue present medical regimen.  6 chronic diastolic congestive heart failure-continue Lasix at present dose.  7 hyperlipidemia-continue statin. Check lipids.  8 chronic stage IIIa kidney disease-follow renal function closely with use of diuretics.  Olga Millers, MD

## 2023-06-04 ENCOUNTER — Ambulatory Visit: Payer: Medicare Other | Admitting: Cardiology

## 2023-06-12 ENCOUNTER — Ambulatory Visit: Payer: Medicare Other | Attending: Cardiology | Admitting: Cardiology

## 2023-06-12 ENCOUNTER — Encounter: Payer: Self-pay | Admitting: Cardiology

## 2023-06-12 VITALS — BP 138/60 | HR 59 | Ht 69.0 in | Wt 158.1 lb

## 2023-06-12 DIAGNOSIS — I5032 Chronic diastolic (congestive) heart failure: Secondary | ICD-10-CM | POA: Insufficient documentation

## 2023-06-12 DIAGNOSIS — I340 Nonrheumatic mitral (valve) insufficiency: Secondary | ICD-10-CM | POA: Insufficient documentation

## 2023-06-12 DIAGNOSIS — I2510 Atherosclerotic heart disease of native coronary artery without angina pectoris: Secondary | ICD-10-CM | POA: Insufficient documentation

## 2023-06-12 DIAGNOSIS — E785 Hyperlipidemia, unspecified: Secondary | ICD-10-CM | POA: Diagnosis not present

## 2023-06-12 DIAGNOSIS — I480 Paroxysmal atrial fibrillation: Secondary | ICD-10-CM | POA: Insufficient documentation

## 2023-06-12 DIAGNOSIS — Z952 Presence of prosthetic heart valve: Secondary | ICD-10-CM | POA: Insufficient documentation

## 2023-06-12 NOTE — Patient Instructions (Signed)

## 2023-06-13 ENCOUNTER — Telehealth: Payer: Self-pay | Admitting: *Deleted

## 2023-06-13 DIAGNOSIS — R0609 Other forms of dyspnea: Secondary | ICD-10-CM

## 2023-06-13 DIAGNOSIS — I5032 Chronic diastolic (congestive) heart failure: Secondary | ICD-10-CM

## 2023-06-13 LAB — COMPREHENSIVE METABOLIC PANEL
ALT: 48 IU/L — ABNORMAL HIGH (ref 0–44)
AST: 36 IU/L (ref 0–40)
Alkaline Phosphatase: 79 IU/L (ref 44–121)
BUN/Creatinine Ratio: 29 — ABNORMAL HIGH (ref 10–24)
BUN: 56 mg/dL — ABNORMAL HIGH (ref 8–27)
Calcium: 9 mg/dL (ref 8.6–10.2)
Chloride: 100 mmol/L (ref 96–106)
Globulin, Total: 2.8 g/dL (ref 1.5–4.5)
Glucose: 151 mg/dL — ABNORMAL HIGH (ref 70–99)
Potassium: 4.5 mmol/L (ref 3.5–5.2)
Sodium: 142 mmol/L (ref 134–144)
Total Protein: 6.7 g/dL (ref 6.0–8.5)
eGFR: 32 mL/min/{1.73_m2} — ABNORMAL LOW (ref >59)

## 2023-06-13 LAB — LIPID PANEL
Chol/HDL Ratio: 2.9 ratio (ref 0.0–5.0)
Cholesterol, Total: 129 mg/dL (ref 100–199)
HDL: 45 mg/dL (ref >39)
LDL Chol Calc (NIH): 65 mg/dL (ref 0–99)
Triglycerides: 102 mg/dL (ref 0–149)
VLDL Cholesterol Cal: 19 mg/dL (ref 5–40)

## 2023-06-13 LAB — CBC
Hematocrit: 38.6 % (ref 37.5–51.0)
MCH: 31.6 pg (ref 26.6–33.0)
MCHC: 32.6 g/dL (ref 31.5–35.7)
MCV: 97 fL (ref 79–97)
WBC: 8.3 10*3/uL (ref 3.4–10.8)

## 2023-06-13 MED ORDER — FUROSEMIDE 20 MG PO TABS: ORAL_TABLET | ORAL | Status: DC

## 2023-06-13 NOTE — Telephone Encounter (Signed)
-----   Message from Olga Millers sent at 06/13/2023  7:23 AM EDT ----- Change lasix to 20mg  alternating with 40 mg daily; bmet 2 weeks Olga Millers

## 2023-06-13 NOTE — Telephone Encounter (Signed)
Spoke with pt, per dr Jens Som, he will take furosemide 20 mg 2 tablets once daily alternating with 2 tablets twice daily. Lab orders mailed to the pt

## 2023-06-26 DIAGNOSIS — I5032 Chronic diastolic (congestive) heart failure: Secondary | ICD-10-CM | POA: Diagnosis not present

## 2023-06-27 ENCOUNTER — Telehealth: Payer: Self-pay | Admitting: *Deleted

## 2023-06-27 DIAGNOSIS — I5032 Chronic diastolic (congestive) heart failure: Secondary | ICD-10-CM

## 2023-06-27 DIAGNOSIS — R0609 Other forms of dyspnea: Secondary | ICD-10-CM

## 2023-06-27 MED ORDER — FUROSEMIDE 20 MG PO TABS: 20.0000 mg | ORAL_TABLET | Freq: Every day | ORAL | Status: DC

## 2023-06-27 NOTE — Telephone Encounter (Signed)
-----   Message from Olga Millers sent at 06/27/2023  8:21 AM EDT ----- Change lasix to 20 mg daily; additional 20 mg daily for weight gain of 2-3 lbs; bmet 2 weeks Olga Millers

## 2023-06-27 NOTE — Telephone Encounter (Signed)
Spoke with pt, Aware of dr crenshaw's recommendations.  Lab orders mailed to the pt  

## 2023-07-10 DIAGNOSIS — Z129 Encounter for screening for malignant neoplasm, site unspecified: Secondary | ICD-10-CM | POA: Diagnosis not present

## 2023-07-10 DIAGNOSIS — L814 Other melanin hyperpigmentation: Secondary | ICD-10-CM | POA: Diagnosis not present

## 2023-07-10 DIAGNOSIS — Z85828 Personal history of other malignant neoplasm of skin: Secondary | ICD-10-CM | POA: Diagnosis not present

## 2023-07-10 DIAGNOSIS — L570 Actinic keratosis: Secondary | ICD-10-CM | POA: Diagnosis not present

## 2023-07-10 DIAGNOSIS — L718 Other rosacea: Secondary | ICD-10-CM | POA: Diagnosis not present

## 2023-07-11 DIAGNOSIS — I5032 Chronic diastolic (congestive) heart failure: Secondary | ICD-10-CM | POA: Diagnosis not present

## 2023-07-12 LAB — BASIC METABOLIC PANEL: BUN/Creatinine Ratio: 25 — ABNORMAL HIGH (ref 10–24)

## 2023-08-16 ENCOUNTER — Other Ambulatory Visit (HOSPITAL_BASED_OUTPATIENT_CLINIC_OR_DEPARTMENT_OTHER): Payer: Self-pay

## 2023-08-16 DIAGNOSIS — Z23 Encounter for immunization: Secondary | ICD-10-CM | POA: Diagnosis not present

## 2023-08-16 MED ORDER — FLUAD 0.5 ML IM SUSY
0.5000 mL | PREFILLED_SYRINGE | Freq: Once | INTRAMUSCULAR | 0 refills | Status: AC
  Filled 2023-08-16: qty 0.5, 1d supply, fill #0

## 2023-08-16 MED ORDER — COMIRNATY 30 MCG/0.3ML IM SUSY
0.3000 mL | PREFILLED_SYRINGE | Freq: Once | INTRAMUSCULAR | 0 refills | Status: AC
  Filled 2023-08-16: qty 0.3, 1d supply, fill #0

## 2023-10-23 LAB — CBC AND DIFFERENTIAL
HCT: 36 — AB (ref 41–53)
Hemoglobin: 12.2 — AB (ref 13.5–17.5)
Neutrophils Absolute: 7
WBC: 9.7

## 2023-10-23 LAB — IRON,TIBC AND FERRITIN PANEL
%SAT: 22
Ferritin: 45.8
Iron: 66
TIBC: 297

## 2023-10-23 LAB — MICROALBUMIN / CREATININE URINE RATIO: Microalb Creat Ratio: 192.4

## 2023-10-23 LAB — BASIC METABOLIC PANEL WITH GFR
BUN: 52 — AB (ref 4–21)
CO2: 26 — AB (ref 13–22)
Chloride: 105 (ref 99–108)
Creatinine: 2 — AB (ref 0.6–1.3)
Glucose: 148
Potassium: 4.5 meq/L (ref 3.5–5.1)
Sodium: 141 (ref 137–147)

## 2023-10-23 LAB — HEPATIC FUNCTION PANEL
ALT: 45 U/L — AB (ref 10–40)
AST: 33 (ref 14–40)
Alkaline Phosphatase: 87 (ref 25–125)

## 2023-10-23 LAB — COMPREHENSIVE METABOLIC PANEL WITH GFR
Albumin: 4.4 (ref 3.5–5.0)
Calcium: 9.5 (ref 8.7–10.7)
eGFR: 31

## 2023-10-23 LAB — VITAMIN D 25 HYDROXY (VIT D DEFICIENCY, FRACTURES): Vit D, 25-Hydroxy: 31.7

## 2023-10-23 LAB — LIPID PANEL
Cholesterol: 138 (ref 0–200)
HDL: 54 (ref 35–70)
LDL Cholesterol: 68
Triglycerides: 107 (ref 40–160)

## 2023-10-23 LAB — CBC: RBC: 3.84 — AB (ref 3.87–5.11)

## 2023-10-23 LAB — PROTEIN / CREATININE RATIO, URINE
Albumin, U: 9.2
Creatinine, Urine: 47.8

## 2023-10-23 LAB — TSH: TSH: 4.23 (ref 0.41–5.90)

## 2023-10-23 LAB — HEMOGLOBIN A1C: Hemoglobin A1C: 7.5

## 2023-12-25 NOTE — Progress Notes (Signed)
HPI: FU CAD and AVR. Patient is status post coronary artery bypass graft in [place] in 2001. He also had ablation of what sounds to be SVT at that time. Carotid Dopplers March 2019 showed 1 to 39% bilateral stenosis. He underwent TAVR 5/19. Patient had preoperative cardiac catheterization that revealed severe native three-vessel coronary artery disease with patency of grafts.  Also with h/o PAF. Abd ultrasound 2/21 showed no AAA. Echocardiogram February 2024 showed normal LV function, severe left atrial enlargement, moderate mitral regurgitation, status post aortic valve replacement with mild aortic insufficiency, mean gradient 14 mmHg.  Since last seen, he denies dyspnea, chest pain, palpitations or syncope.  He does have some fatigue.  Current Outpatient Medications  Medication Sig Dispense Refill   acetaminophen (TYLENOL) 500 MG tablet Take 500 mg by mouth every 6 (six) hours as needed for mild pain or moderate pain.     amiodarone (PACERONE) 200 MG tablet Take 1 tablet (200 mg total) by mouth daily. 90 tablet 3   amLODipine (NORVASC) 10 MG tablet Take 10 mg by mouth daily.     amoxicillin (AMOXIL) 500 MG capsule Take 4 capsules (2,000 mg total) by mouth one hour prior to dental procedure. 20 capsule 3   apixaban (ELIQUIS) 2.5 MG TABS tablet Take 1 tablet (2.5 mg total) by mouth 2 (two) times daily.     atorvastatin (LIPITOR) 40 MG tablet Take 40 mg by mouth daily.     Blood Glucose Monitoring Suppl (CONTOUR NEXT EZ) w/Device KIT 1 kit by Does not apply route daily. Use once a day to check blood sugar.  Dx code: E11.9 1 kit 0   Cholecalciferol 25 MCG (1000 UT) tablet      COVID-19 mRNA bivalent vaccine, Moderna, (MODERNA COVID-19 BIVAL BOOSTER) 50 MCG/0.5ML injection Inject into the muscle. 0.5 mL 0   ferrous sulfate 325 (65 FE) MG tablet Take 325 mg by mouth daily with breakfast.     furosemide (LASIX) 20 MG tablet Take 1 tablet (20 mg total) by mouth daily. May take extra 20 mg daily as  needed for weight gain     glucose blood (CONTOUR NEXT TEST) test strip Use once a day to check blood sugar.  Dx code: E11.9 100 each 12   insulin glargine-yfgn (SEMGLEE) 100 UNIT/ML Pen 18 Units.     Insulin Pen Needle (BD PEN NEEDLE NANO U/F) 32G X 4 MM MISC by Does not apply route.     losartan (COZAAR) 100 MG tablet Take 0.5 tablets (50 mg total) by mouth 2 (two) times daily. 90 tablet 1   No current facility-administered medications for this visit.     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   26 mm Edwards Sapien 3 transcatheter heart valve placed via percutaneous right transfemoral approach    Umbilical hernia     Past Surgical History:  Procedure Laterality Date   ABLATION     Rhythm problem; rhythm unknown; [place]   CARDIOVERSION N/A 07/14/2018   Procedure: CARDIOVERSION;  Surgeon: Lars Masson, MD;  Location: [facility] ENDOSCOPY;  Service: Cardiovascular;  Laterality: N/A;   CARDIOVERSION N/A 08/27/2018   Procedure: CARDIOVERSION;  Surgeon: Jake Bathe, MD;  Location: [facility] ENDOSCOPY;  Service: Cardiovascular;  Laterality: N/A;   CORONARY ARTERY BYPASS GRAFT  2001   EXPLORATION POST OPERATIVE OPEN HEART  2001, June   INTRAOPERATIVE TRANSTHORACIC ECHOCARDIOGRAM N/A 04/01/2018   Procedure: INTRAOPERATIVE TRANSTHORACIC ECHOCARDIOGRAM;  Surgeon: Kathleene Hazel, MD;  Location: [facility] OR;  Service: Open Heart Surgery;  Laterality: N/A;   RIGHT/LEFT HEART CATH AND CORONARY/GRAFT ANGIOGRAPHY N/A 01/31/2018   Procedure: RIGHT/LEFT HEART CATH AND CORONARY/GRAFT ANGIOGRAPHY;  Surgeon: Kathleene Hazel, MD;  Location: [facility] INVASIVE CV LAB;  Service: Cardiovascular;   Laterality: N/A;   TEE WITHOUT CARDIOVERSION N/A 01/17/2018   Procedure: TRANSESOPHAGEAL ECHOCARDIOGRAM (TEE);  Surgeon: Lewayne Bunting, MD;  Location: [facility] ENDOSCOPY;  Service: Cardiovascular;  Laterality: N/A;   TEE WITHOUT CARDIOVERSION N/A 07/14/2018   Procedure: TRANSESOPHAGEAL ECHOCARDIOGRAM (TEE);  Surgeon: Lars Masson, MD;  Location: [facility] ENDOSCOPY;  Service: Cardiovascular;  Laterality: N/A;   TONSILLECTOMY     TRANSCATHETER AORTIC VALVE REPLACEMENT, TRANSFEMORAL N/A 04/01/2018   Procedure: TRANSCATHETER AORTIC VALVE REPLACEMENT, TRANSFEMORAL;  Surgeon: Kathleene Hazel, MD;  Location: [facility] OR;  Service: Open Heart Surgery;  Laterality: N/A;   UMBILICAL HERNIA REPAIR  2015    Social History   Socioeconomic History   Marital status: Married    Spouse name: Not on file   Number of children: 2   Years of education: Not on file   Highest education level: Not on file  Occupational History   Not on file  Tobacco Use   Smoking status: Former    Current packs/day: 1.00    Average packs/day: 1 pack/day for 3.0 years (3.0 ttl pk-yrs)    Types: Cigarettes   Smokeless tobacco: Never   Tobacco comments:    quit tobbaco in his 73s  Vaping Use   Vaping status: Never Used  Substance and Sexual Activity   Alcohol use: No    Alcohol/week: 0.0 standard drinks of alcohol   Drug use: No   Sexual activity: Not on file  Other Topics Concern   Not on file  Social History Narrative   Not on file   Social Drivers of Health   Financial Resource Strain: Low Risk  (09/08/2021)   Received from [facility], [facility]   Overall Financial Resource Strain (CARDIA)    Difficulty of Paying Living Expenses: Not very hard  Food Insecurity: No Food Insecurity (09/08/2021)   Received from [facility], [facility]   Hunger Vital Sign    Worried About Running Out of Food in the Last Year: Never true    Ran Out of Food in the Last Year: Never true  Transportation Needs: No  Transportation Needs (09/08/2021)   Received from [employer], [facility]   PRAPARE - Transportation    Lack of Transportation (Medical): No    Lack of Transportation (Non-Medical): No  Physical Activity: Sufficiently Active (09/08/2021)   Received from [facility], [facility]   Exercise Vital Sign    Days of Exercise per Week: 3 days    Minutes of Exercise per Session: 60 min  Stress: No Stress Concern Present (09/08/2021)   Received from [facility] of Occupational Health - Occupational Stress Questionnaire    Feeling of Stress : Not at all  Social Connections: Unknown (03/22/2022)   Received from [facility], [facility]   Social Network  Social Network: Not on file  Intimate Partner Violence: Unknown (02/23/2022)   Received from [facility], [facility]   HITS    Physically Hurt: Not on file    Insult or Talk Down To: Not on file    Threaten Physical Harm: Not on file    Scream or Curse: Not on file    Family History  Problem Relation Age of Onset   Diabetes Mother 4       Deceased   Heart disease Father 65       Deceased   Heart disease Maternal Uncle    Diabetes Maternal Uncle    Heart disease Brother    Diabetes Brother    Diabetes Sister        #1   Heart disease Sister        #2   Hyperlipidemia Son        x2    ROS: no fevers or chills, productive cough, hemoptysis, dysphasia, odynophagia, melena, hematochezia, dysuria, hematuria, rash, seizure activity, orthopnea, PND, pedal edema, claudication. Remaining systems are negative.  Physical Exam: Well-developed well-nourished in no acute distress.  Skin is warm and dry.  HEENT is normal.  Neck is supple.  Chest is clear to auscultation with normal expansion.  Cardiovascular exam is regular rate and rhythm.  2/6 systolic and diastolic murmur left sternal border. Abdominal exam nontender or distended. No masses palpated. Extremities show no edema. neuro  grossly intact  ECG-sinus bradycardia with first-degree AV block, right axis deviation, inferior T wave inversion.  Personally reviewed  A/P  1 paroxysmal atrial fibrillation-patient remains in sinus rhythm.  Continue apixaban and amiodarone at present dose.  Check TSH, liver functions, chest x-ray, renal function and hemoglobin.  2 history of TAVR-most recent echocardiogram showed normal LV function and mild aortic insufficiency.  Will repeat echocardiogram.  3 history of mitral regurgitation-moderate on most recent echocardiogram.  Plan repeat study.  4 coronary artery disease-he denies chest pain.  Continue statin.  He is not on aspirin given need for apixaban.  5 hypertension-blood pressure controlled.  Continue present medications.  6 hyperlipidemia-continue statin.  Check lipids and liver.  7 chronic diastolic congestive heart failure-volume status is reasonable.  Continue diuretic at present dose.  8 chronic stage IIIa kidney disease-continue to follow renal function.  Olga Millers, MD

## 2024-01-03 LAB — HM DIABETES EYE EXAM

## 2024-01-08 ENCOUNTER — Ambulatory Visit: Payer: Medicare Other | Attending: Cardiology | Admitting: Cardiology

## 2024-01-08 ENCOUNTER — Encounter: Payer: Self-pay | Admitting: Cardiology

## 2024-01-08 VITALS — BP 140/58 | HR 53 | Ht 69.0 in | Wt 149.0 lb

## 2024-01-08 DIAGNOSIS — E785 Hyperlipidemia, unspecified: Secondary | ICD-10-CM | POA: Insufficient documentation

## 2024-01-08 DIAGNOSIS — I10 Essential (primary) hypertension: Secondary | ICD-10-CM | POA: Insufficient documentation

## 2024-01-08 DIAGNOSIS — I2510 Atherosclerotic heart disease of native coronary artery without angina pectoris: Secondary | ICD-10-CM | POA: Insufficient documentation

## 2024-01-08 DIAGNOSIS — I480 Paroxysmal atrial fibrillation: Secondary | ICD-10-CM | POA: Diagnosis not present

## 2024-01-08 DIAGNOSIS — I340 Nonrheumatic mitral (valve) insufficiency: Secondary | ICD-10-CM | POA: Diagnosis not present

## 2024-01-08 DIAGNOSIS — Z952 Presence of prosthetic heart valve: Secondary | ICD-10-CM | POA: Diagnosis not present

## 2024-01-08 NOTE — Patient Instructions (Signed)
    Lab Work:  Your physician recommends that you return for lab work in: [employer] on the 3 rd floor in [address] Hours-Monday - Friday 8 am-11:30 AM and 1 pm -4 pm   If you have labs (blood work) drawn today and your tests are completely normal, you will receive your results only by: MyChart Message (if you have MyChart) OR A paper copy in the mail If you have any lab test that is abnormal or we need to change your treatment, we will call you to review the results.   Testing/Procedures:  Your physician has requested that you have an echocardiogram. Echocardiography is a painless test that uses sound waves to create images of your heart. It provides your doctor with information about the size and shape of your heart and how well your heart's chambers and valves are working. This procedure takes approximately one hour. There are no restrictions for this procedure. Please do NOT wear cologne, perfume, aftershave, or lotions (deodorant is allowed). Please arrive 15 minutes prior to your appointment time.  Please note: We ask at that you not bring children with you during ultrasound (echo/ vascular) testing. Due to room size and safety concerns, children are not allowed in the ultrasound rooms during exams. Our front office staff cannot provide observation of children in our lobby area while testing is being conducted. An adult accompanying a patient to their appointment will only be allowed in the ultrasound room at the discretion of the ultrasound technician under special circumstances. We apologize for any inconvenience. [facility] 1 ST FLOOR IMAGING DEPARTMENT  A chest x-ray takes a picture of the organs and structures inside the chest, including the heart, lungs, and blood vessels. This test can show several things, including, whether the heart is enlarges; whether fluid is building up in the lungs; and whether pacemaker / defibrillator leads are  still in place. [facility]   Follow-Up: At [facility], you and your health needs are our priority.  As part of our continuing mission to provide you with exceptional heart care, we have created designated Provider Care Teams.  These Care Teams include your primary Cardiologist (physician) and Advanced Practice Providers (APPs -  Physician Assistants and Nurse Practitioners) who all work together to provide you with the care you need, when you need it.  We recommend signing up for the patient portal called "MyChart".  Sign up information is provided on this After Visit Summary.  MyChart is used to connect with patients for Virtual Visits (Telemedicine).  Patients are able to view lab/test results, encounter notes, upcoming appointments, etc.  Non-urgent messages can be sent to your provider as well.   To learn more about what you can do with MyChart, go to [email or web address].    Your next appointment:   6 month(s)  Provider:   Olga Millers, MD

## 2024-01-10 ENCOUNTER — Ambulatory Visit (HOSPITAL_BASED_OUTPATIENT_CLINIC_OR_DEPARTMENT_OTHER)
Admission: RE | Admit: 2024-01-10 | Discharge: 2024-01-10 | Disposition: A | Discharge status: Home / Self Care | Payer: Medicare Other | Source: Ambulatory Visit | Attending: Cardiology | Admitting: Cardiology

## 2024-01-10 ENCOUNTER — Other Ambulatory Visit: Payer: Self-pay | Admitting: Cardiology

## 2024-01-10 DIAGNOSIS — I10 Essential (primary) hypertension: Secondary | ICD-10-CM

## 2024-01-10 DIAGNOSIS — I480 Paroxysmal atrial fibrillation: Secondary | ICD-10-CM

## 2024-01-10 DIAGNOSIS — I359 Nonrheumatic aortic valve disorder, unspecified: Secondary | ICD-10-CM

## 2024-01-10 DIAGNOSIS — E785 Hyperlipidemia, unspecified: Secondary | ICD-10-CM | POA: Diagnosis not present

## 2024-01-10 DIAGNOSIS — R0989 Other specified symptoms and signs involving the circulatory and respiratory systems: Secondary | ICD-10-CM | POA: Diagnosis not present

## 2024-01-10 DIAGNOSIS — I517 Cardiomegaly: Secondary | ICD-10-CM | POA: Diagnosis not present

## 2024-01-10 DIAGNOSIS — I340 Nonrheumatic mitral (valve) insufficiency: Secondary | ICD-10-CM

## 2024-01-10 DIAGNOSIS — J849 Interstitial pulmonary disease, unspecified: Secondary | ICD-10-CM | POA: Diagnosis not present

## 2024-01-10 DIAGNOSIS — I2510 Atherosclerotic heart disease of native coronary artery without angina pectoris: Secondary | ICD-10-CM

## 2024-01-10 DIAGNOSIS — Z952 Presence of prosthetic heart valve: Secondary | ICD-10-CM

## 2024-01-11 LAB — LIPID PANEL
Chol/HDL Ratio: 2.7 {ratio} (ref 0.0–5.0)
Cholesterol, Total: 130 mg/dL (ref 100–199)
HDL: 49 mg/dL (ref >39)
LDL Chol Calc (NIH): 67 mg/dL (ref 0–99)
Triglycerides: 68 mg/dL (ref 0–149)
VLDL Cholesterol Cal: 14 mg/dL (ref 5–40)

## 2024-01-11 LAB — CBC
Hematocrit: 39.2 % (ref 37.5–51.0)
Hemoglobin: 12.5 g/dL — ABNORMAL LOW (ref 13.0–17.7)
MCH: 32.1 pg (ref 26.6–33.0)
MCHC: 31.9 g/dL (ref 31.5–35.7)
MCV: 101 fL — ABNORMAL HIGH (ref 79–97)
Platelets: 156 10*3/uL (ref 150–450)
RBC: 3.89 x10E6/uL — ABNORMAL LOW (ref 4.14–5.80)
RDW: 13.6 % (ref 11.6–15.4)
WBC: 7.9 10*3/uL (ref 3.4–10.8)

## 2024-01-11 LAB — COMPREHENSIVE METABOLIC PANEL
ALT: 28 [IU]/L (ref 0–44)
AST: 22 [IU]/L (ref 0–40)
Albumin: 3.9 g/dL (ref 3.7–4.7)
Alkaline Phosphatase: 73 [IU]/L (ref 44–121)
BUN/Creatinine Ratio: 25 — ABNORMAL HIGH (ref 10–24)
BUN: 42 mg/dL — ABNORMAL HIGH (ref 8–27)
Bilirubin Total: 0.5 mg/dL (ref 0.0–1.2)
CO2: 22 mmol/L (ref 20–29)
Calcium: 9.1 mg/dL (ref 8.6–10.2)
Chloride: 105 mmol/L (ref 96–106)
Creatinine, Ser: 1.71 mg/dL — ABNORMAL HIGH (ref 0.76–1.27)
Globulin, Total: 2.4 g/dL (ref 1.5–4.5)
Glucose: 91 mg/dL (ref 70–99)
Potassium: 4.3 mmol/L (ref 3.5–5.2)
Sodium: 140 mmol/L (ref 134–144)
Total Protein: 6.3 g/dL (ref 6.0–8.5)
eGFR: 38 mL/min/{1.73_m2} — ABNORMAL LOW (ref >59)

## 2024-01-11 LAB — TSH: TSH: 8.01 u[IU]/mL — ABNORMAL HIGH (ref 0.450–4.500)

## 2024-01-20 ENCOUNTER — Encounter: Payer: Self-pay | Admitting: *Deleted

## 2024-01-27 ENCOUNTER — Ambulatory Visit (HOSPITAL_BASED_OUTPATIENT_CLINIC_OR_DEPARTMENT_OTHER)
Admission: RE | Admit: 2024-01-27 | Discharge: 2024-01-27 | Disposition: A | Discharge status: Home / Self Care | Payer: Medicare Other | Source: Ambulatory Visit | Attending: Cardiology | Admitting: Cardiology

## 2024-01-27 ENCOUNTER — Encounter: Payer: Self-pay | Admitting: *Deleted

## 2024-01-27 DIAGNOSIS — I480 Paroxysmal atrial fibrillation: Secondary | ICD-10-CM | POA: Insufficient documentation

## 2024-01-27 DIAGNOSIS — I340 Nonrheumatic mitral (valve) insufficiency: Secondary | ICD-10-CM | POA: Diagnosis present

## 2024-01-27 DIAGNOSIS — I359 Nonrheumatic aortic valve disorder, unspecified: Secondary | ICD-10-CM | POA: Insufficient documentation

## 2024-01-27 LAB — ECHOCARDIOGRAM COMPLETE
AR max vel: 2.14 cm2
AV Area VTI: 2.11 cm2
AV Area mean vel: 2.25 cm2
AV Mean grad: 14 mmHg
AV Peak grad: 27.2 mmHg
Ao pk vel: 2.61 m/s
Area-P 1/2: 3.83 cm2
Calc EF: 52.5 %
MV M vel: 5.18 m/s
MV Peak grad: 107.3 mmHg
MV VTI: 2.49 cm2
S' Lateral: 3.8 cm
Single Plane A2C EF: 53.7 %
Single Plane A4C EF: 50.4 %

## 2024-02-19 DIAGNOSIS — D485 Neoplasm of uncertain behavior of skin: Secondary | ICD-10-CM | POA: Diagnosis not present

## 2024-02-19 DIAGNOSIS — L814 Other melanin hyperpigmentation: Secondary | ICD-10-CM | POA: Diagnosis not present

## 2024-02-19 DIAGNOSIS — C44622 Squamous cell carcinoma of skin of right upper limb, including shoulder: Secondary | ICD-10-CM | POA: Diagnosis not present

## 2024-02-19 DIAGNOSIS — Z85828 Personal history of other malignant neoplasm of skin: Secondary | ICD-10-CM | POA: Diagnosis not present

## 2024-02-19 DIAGNOSIS — Z129 Encounter for screening for malignant neoplasm, site unspecified: Secondary | ICD-10-CM | POA: Diagnosis not present

## 2024-02-19 DIAGNOSIS — S60552A Superficial foreign body of left hand, initial encounter: Secondary | ICD-10-CM | POA: Diagnosis not present

## 2024-02-19 DIAGNOSIS — L570 Actinic keratosis: Secondary | ICD-10-CM | POA: Diagnosis not present

## 2024-02-21 NOTE — Progress Notes (Signed)
 [facility] [address], [address] [place], [place] [address] [phone]  Date:  02/26/2024   Name:  Peter Scott   DOB:  01/11/1932   MRN:  [id number]  PCP:  Pearline Cables, MD    Chief Complaint: Ref to PT (He says his legs are getting weak and he can't stand long. Larose Kells exam: [facility]/A1C/Foot exam due)   History of Present Illness:  Peter Scott is a 89 y.o. very pleasant male patient who presents with the following:  Patient seen today with concern of feeling like his legs are not as strong as in the past He continues to live independently with his wife of many years [name].  He really [place] time [place] where he and his son work on things together  Most recent visit with myself was in October 2022 History of diabetes, atrial fibrillation, aortic valve replacement, CABG, skin cancer, hypertension, hyperlipidemia  Overall Peter Scott feels that he is doing very well for his age.  He just notes his legs do not seem to be as strong as they used to be.  He has noticed a gradual change over the last months and years.  He notes he cannot walk as far and gets tired more easily, he may have to sit down or ride in his 4 wheeler at the farm.  No numbness or weakness to suggest a stroke   He sees nephrology through the [place] [facility] notes [facility] is now [facility] his DM He is using a CBG and is monitoring his glucose with his phone- per his app his current A1c is just under 7%   He is continue to follow-up regularly with cardiology, seen by Dr. Jens Som in February: 1 paroxysmal atrial fibrillation-patient remains in sinus rhythm.  Continue apixaban and amiodarone at present dose.  Check TSH, liver functions, chest x-ray, renal function and hemoglobin. 2 history of TAVR-most recent echocardiogram showed normal LV function and mild aortic insufficiency.  Will repeat echocardiogram. 3 history of mitral regurgitation-moderate on most recent  echocardiogram.  Plan repeat study. 4 coronary artery disease-he denies chest pain.  Continue statin.  He is not on aspirin given need for apixaban. 5 hypertension-blood pressure controlled.  Continue present medications. 6 hyperlipidemia-continue statin.  Check lipids and liver. 7 chronic diastolic congestive heart failure-volume status is reasonable.  Continue diuretic at present dose. 8 chronic stage IIIa kidney disease-continue to follow renal function.   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   S/P CABG x 4    Severe aortic stenosis    Medicare annual wellness visit, subsequent 10/04/2014   Bruit 09/29/2014   Abdominal aortic aneurysm (HCC) 07/23/2014   BCC (basal cell carcinoma) 07/23/2014   SCC (squamous cell carcinoma), arm 07/23/2014   Essential hypertension, benign 07/08/2014   Coronary artery disease 07/08/2014   Hyperlipidemia LDL goal <100 07/08/2014   Type II diabetes mellitus, well controlled (HCC) 07/08/2014   Colon cancer screening 07/08/2014    Past Medical History:  Diagnosis Date   Aortic aneurysm (HCC)  Aortic stenosis    CAD (coronary artery disease)    Diabetes mellitus type II, controlled (HCC)    Heart disease    Ablation   History of chicken pox    Hyperlipidemia    Hypertension    S/P CABG x 4    2001 - [place]   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   26 mm Edwards Sapien 3 transcatheter heart valve placed via percutaneous right transfemoral approach    Umbilical hernia     Past Surgical History:  Procedure Laterality Date   ABLATION      Rhythm problem; rhythm unknown; [place]   CARDIOVERSION N/A 07/14/2018   Procedure: CARDIOVERSION;  Surgeon: Lars Masson, MD;  Location: [facility] ENDOSCOPY;  Service: Cardiovascular;  Laterality: N/A;   CARDIOVERSION N/A 08/27/2018   Procedure: CARDIOVERSION;  Surgeon: Jake Bathe, MD;  Location: [facility] ENDOSCOPY;  Service: Cardiovascular;  Laterality: N/A;   CORONARY ARTERY BYPASS GRAFT  2001   EXPLORATION POST OPERATIVE OPEN HEART  2001, June   INTRAOPERATIVE TRANSTHORACIC ECHOCARDIOGRAM N/A 04/01/2018   Procedure: INTRAOPERATIVE TRANSTHORACIC ECHOCARDIOGRAM;  Surgeon: Kathleene Hazel, MD;  Location: [facility] OR;  Service: Open Heart Surgery;  Laterality: N/A;   RIGHT/LEFT HEART CATH AND CORONARY/GRAFT ANGIOGRAPHY N/A 01/31/2018   Procedure: RIGHT/LEFT HEART CATH AND CORONARY/GRAFT ANGIOGRAPHY;  Surgeon: Kathleene Hazel, MD;  Location: [facility] INVASIVE CV LAB;  Service: Cardiovascular;  Laterality: N/A;   TEE WITHOUT CARDIOVERSION N/A 01/17/2018   Procedure: TRANSESOPHAGEAL ECHOCARDIOGRAM (TEE);  Surgeon: Lewayne Bunting, MD;  Location: [facility] ENDOSCOPY;  Service: Cardiovascular;  Laterality: N/A;   TEE WITHOUT CARDIOVERSION N/A 07/14/2018   Procedure: TRANSESOPHAGEAL ECHOCARDIOGRAM (TEE);  Surgeon: Lars Masson, MD;  Location: [facility] ENDOSCOPY;  Service: Cardiovascular;  Laterality: N/A;   TONSILLECTOMY     TRANSCATHETER AORTIC VALVE REPLACEMENT, TRANSFEMORAL N/A 04/01/2018   Procedure: TRANSCATHETER AORTIC VALVE REPLACEMENT, TRANSFEMORAL;  Surgeon: Kathleene Hazel, MD;  Location: [facility] OR;  Service: Open Heart Surgery;  Laterality: N/A;   UMBILICAL HERNIA REPAIR  2015    Social History   Tobacco Use   Smoking status: Former    Current packs/day: 1.00    Average packs/day: 1 pack/day for 3.0 years (3.0 ttl pk-yrs)    Types: Cigarettes   Smokeless tobacco: Never   Tobacco comments:    quit tobbaco in his 86s  Vaping Use   Vaping status: Never Used  Substance Use Topics    Alcohol use: No    Alcohol/week: 0.0 standard drinks of alcohol   Drug use: No    Family History  Problem Relation Age of Onset   Diabetes Mother 61       Deceased   Heart disease Father 28       Deceased   Heart disease Maternal Uncle    Diabetes Maternal Uncle    Heart disease Brother    Diabetes Brother    Diabetes Sister        #1   Heart disease Sister        #2   Hyperlipidemia Son        x2    Allergies  Allergen Reactions   Glipizide Palpitations    "Heart racing" "Heart racing"    Medication list has been reviewed and updated.  Current Outpatient Medications on File Prior to Visit  Medication Sig Dispense Refill   acetaminophen (TYLENOL) 500 MG tablet Take 500 mg by mouth every 6 (six) hours as needed for mild pain or moderate  pain.     amiodarone (PACERONE) 200 MG tablet Take 1 tablet (200 mg total) by mouth daily. 90 tablet 3   amLODipine (NORVASC) 10 MG tablet Take 10 mg by mouth daily.     amoxicillin (AMOXIL) 500 MG capsule Take 4 capsules (2,000 mg total) by mouth one hour prior to dental procedure. 20 capsule 3   apixaban (ELIQUIS) 2.5 MG TABS tablet Take 1 tablet (2.5 mg total) by mouth 2 (two) times daily.     atorvastatin (LIPITOR) 40 MG tablet Take 40 mg by mouth daily.     Blood Glucose Monitoring Suppl (CONTOUR NEXT EZ) w/Device KIT 1 kit by Does not apply route daily. Use once a day to check blood sugar.  Dx code: E11.9 1 kit 0   Cholecalciferol 25 MCG (1000 UT) tablet      COVID-19 mRNA bivalent vaccine, Moderna, (MODERNA COVID-19 BIVAL BOOSTER) 50 MCG/0.5ML injection Inject into the muscle. 0.5 mL 0   ferrous sulfate 325 (65 FE) MG tablet Take 325 mg by mouth daily with breakfast.     furosemide (LASIX) 20 MG tablet Take 1 tablet (20 mg total) by mouth daily. May take extra 20 mg daily as needed for weight gain     glucose blood (CONTOUR NEXT TEST) test strip Use once a day to check blood sugar.  Dx code: E11.9 100 each 12   insulin  glargine-yfgn (SEMGLEE) 100 UNIT/ML Pen 18 Units.     Insulin Pen Needle (BD PEN NEEDLE NANO U/F) 32G X 4 MM MISC by Does not apply route.     losartan (COZAAR) 100 MG tablet Take 0.5 tablets (50 mg total) by mouth 2 (two) times daily. 90 tablet 1   No current facility-administered medications on file prior to visit.    Review of Systems:  As per HPI- otherwise negative.   Physical Examination: Vitals:   02/26/24 0812  BP: 120/60  Pulse: (!) 54  Resp: 18  Temp: 97.6 F (36.4 C)  SpO2: 97%   Vitals:   02/26/24 0812  Weight: 155 lb 9.6 oz (70.6 kg)  Height: 5\' 9"  (1.753 m)   Body mass index is 22.98 kg/m. Ideal Body Weight: Weight in (lb) to have BMI = 25: 168.9  GEN: no acute distress. Normal weight, looks well  HEENT: Atraumatic, Normocephalic.  Bilateral TM wnl, oropharynx normal.  PEERL,EOMI.   Ears and Nose: No external deformity. CV: RRR, No M/G/R. No JVD. No thrill. No extra heart sounds. PULM: CTA B, no wheezes, crackles, rhonchi. No retractions. No resp. distress. No accessory muscle use. EXTR: No c/c/e PSYCH: Normally interactive. Conversant.  Excellent pulses in both LE, normal monofilament sensation Wearing compression socks  He has good leg strength for age but right leg is stronger than left Small wound on dorsum of right hand- will update tetanus   Assessment and Plan: Leg weakness, bilateral - Plan: Ambulatory referral to Physical Therapy  Open wound of right hand without foreign body, unspecified wound type, initial encounter - Plan: Td vaccine greater than or equal to 7yo preservative free IM  Essential hypertension, benign  Coronary artery disease due to lipid rich plaque  Paroxysmal atrial fibrillation (HCC)  Type II diabetes mellitus, well controlled (HCC)  Following up today- generally doing great for age Update tetanus Recommend RSV, covid He has noted some gradual loss of leg strength and walking endurance, he would like to try PT.   Referral made [facility],  he is consistent with cardiology follow-up  Signed Abbe Amsterdam, MD

## 2024-02-21 NOTE — Patient Instructions (Addendum)
 It was good to see you again!   We will get you set up with physical therapy to help work on your leg strength Tetanus booster today Recommend a dose of RSV vaccine if not done yet - just one dose needed Keep your covid boosters up to date  Keep up the good work!

## 2024-02-26 ENCOUNTER — Ambulatory Visit: Admitting: Family Medicine

## 2024-02-26 VITALS — BP 120/60 | HR 54 | Temp 97.6°F | Resp 18 | Ht 69.0 in | Wt 155.6 lb

## 2024-02-26 DIAGNOSIS — I2510 Atherosclerotic heart disease of native coronary artery without angina pectoris: Secondary | ICD-10-CM | POA: Diagnosis not present

## 2024-02-26 DIAGNOSIS — I480 Paroxysmal atrial fibrillation: Secondary | ICD-10-CM

## 2024-02-26 DIAGNOSIS — I10 Essential (primary) hypertension: Secondary | ICD-10-CM

## 2024-02-26 DIAGNOSIS — R29898 Other symptoms and signs involving the musculoskeletal system: Secondary | ICD-10-CM | POA: Diagnosis not present

## 2024-02-26 DIAGNOSIS — E119 Type 2 diabetes mellitus without complications: Secondary | ICD-10-CM | POA: Diagnosis not present

## 2024-02-26 DIAGNOSIS — Z23 Encounter for immunization: Secondary | ICD-10-CM

## 2024-02-26 DIAGNOSIS — S61401A Unspecified open wound of right hand, initial encounter: Secondary | ICD-10-CM | POA: Diagnosis not present

## 2024-02-26 DIAGNOSIS — I2583 Coronary atherosclerosis due to lipid rich plaque: Secondary | ICD-10-CM | POA: Diagnosis not present

## 2024-03-16 NOTE — Therapy (Incomplete)
 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   26 mm Edwards Sapien 3 transcatheter heart valve placed via percutaneous right transfemoral approach    Umbilical hernia    Past Surgical History:  Procedure Laterality Date   ABLATION     Rhythm problem; rhythm unknown; [place]   CARDIOVERSION N/A 07/14/2018   Procedure: CARDIOVERSION;  Surgeon: Liza Riggers, MD;  Location: [facility] ENDOSCOPY;  Service: Cardiovascular;  Laterality: N/A;   CARDIOVERSION N/A 08/27/2018   Procedure: CARDIOVERSION;  Surgeon: Hugh Madura, MD;  Location: [facility] ENDOSCOPY;  Service: Cardiovascular;  Laterality: N/A;   CORONARY ARTERY BYPASS GRAFT  2001   EXPLORATION POST OPERATIVE OPEN HEART  2001, [place]   INTRAOPERATIVE TRANSTHORACIC ECHOCARDIOGRAM N/A 04/01/2018   Procedure: INTRAOPERATIVE TRANSTHORACIC ECHOCARDIOGRAM;  Surgeon: Odie Benne, MD;  Location: [facility] OR;  Service: Open Heart Surgery;  Laterality: N/A;   RIGHT/LEFT HEART CATH AND CORONARY/GRAFT ANGIOGRAPHY N/A 01/31/2018   Procedure: RIGHT/LEFT HEART CATH AND CORONARY/GRAFT ANGIOGRAPHY;  Surgeon: Odie Benne, MD;  [facility]: [facility] INVASIVE CV LAB;  Service: Cardiovascular;  Laterality: N/A;   TEE WITHOUT CARDIOVERSION N/A 01/17/2018   Procedure: TRANSESOPHAGEAL ECHOCARDIOGRAM (TEE);  Surgeon: Lenise Quince, MD;  Location: [facility] ENDOSCOPY;  Service: Cardiovascular;  Laterality: N/A;   TEE WITHOUT CARDIOVERSION N/A 07/14/2018   Procedure: TRANSESOPHAGEAL  ECHOCARDIOGRAM (TEE);  Surgeon: Liza Riggers, MD;  Location: [facility] ENDOSCOPY;  Service: Cardiovascular;  Laterality: N/A;   TONSILLECTOMY     TRANSCATHETER AORTIC VALVE REPLACEMENT, TRANSFEMORAL N/A 04/01/2018   Procedure: TRANSCATHETER AORTIC VALVE REPLACEMENT, TRANSFEMORAL;  Surgeon: Odie Benne, MD;  Location: [facility] OR;  Service: Open Heart Surgery;  Laterality: N/A;   UMBILICAL HERNIA REPAIR  2015   Patient Active Problem List   Diagnosis Date Noted   Paroxysmal atrial fibrillation (HCC)    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   S/P CABG x 4    Severe aortic stenosis    Medicare annual wellness visit, subsequent 10/04/2014   Bruit 09/29/2014   Abdominal aortic aneurysm (HCC) 07/23/2014   BCC (basal cell carcinoma) 07/23/2014   SCC (squamous cell carcinoma), arm 07/23/2014   Essential hypertension, benign 07/08/2014   Coronary artery disease 07/08/2014   Hyperlipidemia LDL goal <100 07/08/2014   Type II diabetes mellitus, well controlled (HCC) 07/08/2014   Colon cancer screening 07/08/2014    PCP: Copland, Skipper Dumas, MD   REFERRING PROVIDER: Kaylee Partridge, MD   REFERRING DIAG: R29.898 (ICD-10-CM) - Leg weakness, bilateral   THERAPY DIAG:  No diagnosis found.  RATIONALE FOR EVALUATION AND TREATMENT: Rehabilitation  ONSET DATE: ***  NEXT MD VISIT: *** None scheduled    SUBJECTIVE:  SUBJECTIVE STATEMENT: ***  Pt accompanied by: {accompnied:27141}  PAIN: Are you having pain? {OPRCPAIN:27236}  PERTINENT HISTORY:  ***Aortic aneurysm, aortic stenosis, CAD s/p CABG x 4 (2001), DM-II, s/p TAVR (2019), paroxysmal afib s/p ablation, HTN, s/p umbilical hernia repair  PRECAUTIONS: {Therapy precautions:24002}  RED FLAGS: {PT Red  Flags:29287}  WEIGHT BEARING RESTRICTIONS: {Yes ***/No:24003}  FALLS:  Has patient fallen in last 6 months? {fallsyesno:27318}  LIVING ENVIRONMENT: Lives with: {OPRC lives with:25569::"lives with their family"} Lives in: {Lives in:25570} Stairs: {opstairs:27293} Has following equipment at home: {Assistive devices:23999}  OCCUPATION: ***  PLOF: {PLOF:24004}  PATIENT GOALS: ***   OBJECTIVE: (objective measures completed at initial evaluation unless otherwise dated)  DIAGNOSTIC FINDINGS:  ***N/A  COGNITION: Overall cognitive status: {cognition:24006}   SENSATION: {sensation:27233}  COORDINATION: ***  EDEMA:  {edema:24020}  MUSCLE TONE: {LE tone:25568}  DTRs:  {DTR SITE:24025}  POSTURE:  {posture:25561}  MUSCLE LENGTH: Hamstrings: Right *** deg; Left *** deg Thomas test: Right *** deg; Left *** deg Hamstrings: *** ITB: *** Piriformis: *** Hip flexors: *** Quads: *** Heelcord: ***  LOWER EXTREMITY ROM:     {AROM/PROM:27142}  Right eval Left eval  Hip flexion    Hip extension    Hip abduction    Hip adduction    Hip internal rotation    Hip external rotation    Knee flexion    Knee extension    Ankle dorsiflexion    Ankle plantarflexion    Ankle inversion    Ankle eversion     (Blank rows = not tested)  LOWER EXTREMITY MMT:    MMT Right eval Left eval  Hip flexion    Hip extension    Hip abduction    Hip adduction    Hip internal rotation    Hip external rotation    Knee flexion    Knee extension    Ankle dorsiflexion    Ankle plantarflexion    Ankle inversion    Ankle eversion    (Blank rows = not tested)  BED MOBILITY:  {Bed mobility:24027}  TRANSFERS: Assistive device utilized: {Assistive devices:23999}  Sit to stand: {Levels of assistance:24026} Stand to sit: {Levels of assistance:24026} Chair to chair: {Levels of assistance:24026} Floor: {Levels of assistance:24026}  GAIT: Distance walked: *** Assistive device  utilized: {Assistive devices:23999} Level of assistance: {Levels of assistance:24026} Gait pattern: {gait characteristics:25376} Comments: ***  RAMP: Level of Assistance: {Levels of assistance:24026} Assistive device utilized: {Assistive devices:23999} Ramp Comments: ***  CURB:  Level of Assistance: {Levels of assistance:24026} Assistive device utilized: {Assistive devices:23999} Curb Comments: ***  STAIRS:  Level of Assistance: {Levels of assistance:24026}  Stair Negotiation Technique: {Stair Technique:27161} with {Rail Assistance:27162}  Number of Stairs: ***   Height of Stairs: ***  Comments: ***  FUNCTIONAL TESTS:  {Functional tests:24029}  PATIENT SURVEYS:  {rehab surveys:24030}   TODAY'S TREATMENT:   ***   PATIENT EDUCATION:  Education details: {Education details:27468}  Person educated: {Person educated:25204} Education method: {Education Method:25205} Education comprehension: {Education Comprehension:25206}  HOME EXERCISE PROGRAM: ***   ASSESSMENT:  CLINICAL IMPRESSION: CLARON CAPPELL is a 89 y.o. male who was referred to physical therapy for evaluation and treatment for B LE weakness.  *** Patient presents with physical impairments of impaired activity tolerance, impaired standing balance, impaired ambulation, and decreased safety awareness impacting safe and independent functional mobility.  Examination revealed patient is at risk for falls and functional decline as evidenced by the following objective test measures: Gait speed *** ft/sec, (2.62 ft/sec is needed for community  access), mCTSIB: position 1: *** sec, position 2: *** sec, position 3: *** sec, position 4: *** sec (30sec in each position demonstrates equal weighting of balance systems), TUG of *** sec (>13.5 sec indicates increased risk for falls), and 5xSTS of *** sec (>15 sec indicates increased risk for falls and decreased BLE power). [place] will benefit from skilled PT to address above deficits to  improve mobility and activity tolerance to help reach the maximal level of functional independence and mobility. Patient demonstrates understanding of this POC and is in agreement with this plan.   OBJECTIVE IMPAIRMENTS: {opptimpairments:25111}.   ACTIVITY LIMITATIONS: {activitylimitations:27494}  PARTICIPATION LIMITATIONS: {participationrestrictions:25113}  PERSONAL FACTORS: {Personal factors:25162} are also affecting patient's functional outcome.   REHAB POTENTIAL: {rehabpotential:25112}  CLINICAL DECISION MAKING: {clinical decision making:25114}  EVALUATION COMPLEXITY: {Evaluation complexity:25115}   GOALS: Goals reviewed with patient? {yes/no:20286}  SHORT TERM GOALS: Target date: ***  Patient will be independent with initial HEP to improve outcomes and carryover.  Baseline: *** Goal status: {GOALSTATUS:25110}  2.  Patient will be educated on strategies to decrease risk of falls.  Baseline: *** Goal status: {GOALSTATUS:25110}  3.  Patient will demonstrate decreased TUG time to </= *** sec to decrease risk for falls with transitional mobility. Baseline: *** Goal status: {GOALSTATUS:25110}  LONG TERM GOALS: Target date: ***  Patient will be independent with advanced/ongoing HEP to facilitate ability to maintain/progress functional gains from skilled physical therapy services. Baseline: *** Goal status: {GOALSTATUS:25110}  2.  Patient will be able to ambulate 600' with or w/o LRAD on variable surfaces with good safety to access community.  Baseline: *** Goal status: {GOALSTATUS:25110}  3.  Patient will be able to step up/down curb safely with LRAD for safety with community ambulation.  Baseline: *** Goal status: {GOALSTATUS:25110}   4.  Patient will demonstrate improved *** strength to >/= ***/5 for improved stability and ease of mobility . Baseline: *** Goal status: {GOALSTATUS:25110}  5.  Patient will improve 5xSTS time to </= *** seconds for improved efficiency  and safety with transfers. Baseline: *** Goal status: {GOALSTATUS:25110}   6.  Patient will demonstrate gait speed of >/= 1.8 ft/sec (0.55 m/s) to be a safe limited community ambulator with decreased risk for recurrent falls.  Baseline: *** Goal status: {GOALSTATUS:25110}  7.  Patient will improve Berg score to >/= ***/56 to improve safety and stability with ADLs in standing and reduce risk for falls. (MCID= 8 points)  Baseline: *** Goal status: {GOALSTATUS:25110}  8.  Patient will demonstrate at least ***19/24 on DGI to improve gait stability and reduce risk for falls. Baseline: *** Goal status: {GOALSTATUS:25110}  9. Patient will improve FGA score to at least ***19/30 to improve gait stability and reduce risk for falls. Baseline: *** Goal status: {GOALSTATUS:25110}  10.  Patient will report >/= ***% on ABC scale (MCID = 19%) to demonstrate improved balance confidence with functional mobility and gait. Baseline: *** Goal status: {GOALSTATUS:25110}   PLAN:  PT FREQUENCY: {rehab frequency:25116}  PT DURATION: {rehab duration:25117}  PLANNED INTERVENTIONS: {rehab planned interventions:25118::"97110-Therapeutic exercises","97530- Therapeutic [phone]- Neuromuscular re-education","97535- Self JXBJ","47829- Manual therapy"}  PLAN FOR NEXT SESSION: ***   Francisco Irving, PT 03/16/2024, 9:22 AM

## 2024-03-18 ENCOUNTER — Ambulatory Visit: Attending: Family Medicine | Admitting: Physical Therapy

## 2024-03-18 ENCOUNTER — Other Ambulatory Visit: Payer: Self-pay

## 2024-03-18 DIAGNOSIS — R29898 Other symptoms and signs involving the musculoskeletal system: Secondary | ICD-10-CM | POA: Diagnosis not present

## 2024-03-18 DIAGNOSIS — R262 Difficulty in walking, not elsewhere classified: Secondary | ICD-10-CM | POA: Insufficient documentation

## 2024-03-18 DIAGNOSIS — M6281 Muscle weakness (generalized): Secondary | ICD-10-CM | POA: Insufficient documentation

## 2024-03-18 DIAGNOSIS — R2681 Unsteadiness on feet: Secondary | ICD-10-CM | POA: Insufficient documentation

## 2024-03-20 ENCOUNTER — Ambulatory Visit: Attending: Family Medicine | Admitting: Physical Therapy

## 2024-03-20 ENCOUNTER — Encounter: Payer: Self-pay | Admitting: Physical Therapy

## 2024-03-20 DIAGNOSIS — M6281 Muscle weakness (generalized): Secondary | ICD-10-CM | POA: Insufficient documentation

## 2024-03-20 DIAGNOSIS — R2681 Unsteadiness on feet: Secondary | ICD-10-CM | POA: Insufficient documentation

## 2024-03-20 DIAGNOSIS — R262 Difficulty in walking, not elsewhere classified: Secondary | ICD-10-CM | POA: Diagnosis not present

## 2024-03-20 NOTE — Therapy (Signed)
 OUTPATIENT PHYSICAL THERAPY TREATMENT   Patient Name: JADON MITTER MRN: [id number] DOB:09/21/1932, 89 y.o., male Today's Date: 03/20/2024   END OF SESSION:  PT End of Session - 03/20/24 0841     Visit Number 2    Date for PT Re-Evaluation 06/10/24    Authorization Type Medicare & AARP Supplement    PT Start Time [phone]    PT Stop Time 0925    PT Time Calculation (min) 44 min    Activity Tolerance Patient tolerated treatment well    Behavior During Therapy [facility] for tasks assessed/performed             Past Medical History:  Diagnosis Date   Aortic aneurysm (HCC)    Aortic stenosis    CAD (coronary artery disease)    Diabetes mellitus type II, controlled (HCC)    Heart disease    Ablation   History of chicken pox    Hyperlipidemia    Hypertension    S/P CABG x 4    2001 - [place]    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   26 mm Edwards Sapien 3 transcatheter heart valve placed via percutaneous right transfemoral approach    Umbilical hernia    Past Surgical History:  Procedure Laterality Date   ABLATION     Rhythm problem; rhythm unknown; [place]   CARDIOVERSION N/A 07/14/2018   Procedure: CARDIOVERSION;  Surgeon: Liza Riggers, MD;  Location: [facility] ENDOSCOPY;  Service: Cardiovascular;  Laterality: N/A;   CARDIOVERSION N/A 08/27/2018   Procedure: CARDIOVERSION;  Surgeon: Hugh Madura, MD;  Location: [facility] ENDOSCOPY;  Service: Cardiovascular;  Laterality: N/A;   CORONARY ARTERY BYPASS GRAFT  2001   EXPLORATION POST OPERATIVE OPEN HEART  2001, June   INTRAOPERATIVE TRANSTHORACIC ECHOCARDIOGRAM N/A 04/01/2018   Procedure: INTRAOPERATIVE TRANSTHORACIC ECHOCARDIOGRAM;  Surgeon: Odie Benne, MD;  Location: [facility] OR;  Service: Open Heart Surgery;  Laterality: N/A;   RIGHT/LEFT HEART CATH AND CORONARY/GRAFT ANGIOGRAPHY N/A 01/31/2018   Procedure: RIGHT/LEFT HEART CATH AND CORONARY/GRAFT ANGIOGRAPHY;  Surgeon: Odie Benne, MD;   Location: [facility] INVASIVE CV LAB;  Service: Cardiovascular;  Laterality: N/A;   TEE WITHOUT CARDIOVERSION N/A 01/17/2018   Procedure: TRANSESOPHAGEAL ECHOCARDIOGRAM (TEE);  Surgeon: Lenise Quince, MD;  Location: [facility] ENDOSCOPY;  Service: Cardiovascular;  Laterality: N/A;   TEE WITHOUT CARDIOVERSION N/A 07/14/2018   Procedure: TRANSESOPHAGEAL ECHOCARDIOGRAM (TEE);  Surgeon: Liza Riggers, MD;  Location: 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   S/P CABG x 4    Severe aortic stenosis    Medicare annual wellness visit, subsequent 10/04/2014   Bruit 09/29/2014   Abdominal aortic aneurysm (HCC) 07/23/2014   BCC (basal cell carcinoma) 07/23/2014   SCC (squamous cell carcinoma), arm 07/23/2014   Essential hypertension, benign 07/08/2014   Coronary artery disease 07/08/2014   Hyperlipidemia LDL goal <100 07/08/2014   Type II diabetes mellitus, well controlled (HCC) 07/08/2014   Colon cancer screening 07/08/2014    PCP: Copland, Skipper Dumas, MD   REFERRING PROVIDER: Kaylee Partridge, MD  REFERRING DIAG: R29.898 (ICD-10-CM) - Leg weakness, bilateral   THERAPY DIAG:  Muscle weakness (generalized)  Difficulty in walking, not elsewhere classified  Unsteadiness on feet  RATIONALE FOR EVALUATION AND TREATMENT: Rehabilitation  ONSET DATE:  worsening over the past 3-4 yrs  NEXT MD VISIT:  None scheduled    SUBJECTIVE:                                                                                                                                                                                                          SUBJECTIVE STATEMENT: Patient reports that his legs just feel weak and that he has gone down hill with his ability to get up and down from the ground  From Eval:  Pt reports good upper body strength but notes weakness and fatigue with lower body activities.  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  He stays very active but feels limited with standing activities or activities where he has to lift things.  He denies pain limitations or limitations due to balance.  PAIN: Are you having pain? No  PERTINENT HISTORY:  Aortic aneurysm, aortic stenosis, CAD s/p CABG x 4 (2001), DM-II, s/p TAVR (2019), paroxysmal afib s/p ablation, HTN, s/p umbilical hernia repair  PRECAUTIONS: Fall  RED FLAGS: None  WEIGHT BEARING RESTRICTIONS: No  FALLS:  Has patient fallen in last 6 months? 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 Be able to stand and squat."   OBJECTIVE: (objective measures completed at initial evaluation unless otherwise dated)  DIAGNOSTIC FINDINGS:  N/A  COGNITION: Overall cognitive status: Within functional limits for tasks assessed   SENSATION: WFL  POSTURE:  rounded shoulders, forward head, and flexed trunk   MUSCLE LENGTH: Hamstrings: mod/severe tight B ITB: mild tight/WFL Piriformis: mod tight B Hip flexors: mod tight  B Quads: mild/mod tight B Heelcord: NT  LOWER EXTREMITY ROM:    WFL other than limitations related to muscle tightness as indicated above  LOWER  EXTREMITY MMT:    MMT Right eval Left eval  Hip flexion 4 4  Hip extension 3+ 3+  Hip abduction 4- 4-  Hip adduction 4 4  Hip internal rotation 4 4  Hip external rotation 4- 4-  Knee flexion 4+ 4+  Knee extension 4+ 4+  Ankle dorsiflexion 4- 4-  Ankle plantarflexion 4 (14 SLS HR) 4 (15 SLS HR)  Ankle inversion    Ankle eversion    (Blank rows = not tested)  BED MOBILITY:  Independent  TRANSFERS: Assistive device utilized: None  Sit to stand: Complete Independence Stand to sit: Complete Independence Chair to chair: Modified independence Floor:  NT  GAIT: Distance walked: clinic distances Assistive device utilized: None Level of assistance: Complete Independence Gait pattern: trunk flexed Comments: fatigue with increased SOB/DOE noted during ambulation for FGA  STAIRS:  Level of Assistance: Modified independence  Stair Negotiation Technique: Alternating Pattern  with Single Rail on Right  Number of Stairs: 14   Height of Stairs: 7"  Comments: increased SOB due to fatigue after attempt  FUNCTIONAL TESTS:  5 times sit to stand: 18.47 sec Timed up and go (TUG): 13.34 sec 10 meter walk test: 15.16 sec; Gait speed = 2.16 ft/sec  Functional gait assessment: 19/30; 19-24 = medium risk fall  PATIENT SURVEYS:  LEFS 40 / 80 = 50.0 %; 41-60: Mild to moderate functional limitation   TODAY'S TREATMENT:  03/20/24 Nustep level 5 x 6 minutes Resisted gait in the hall all directions 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  Reviewed and performed     03/18/2024 - Eval SELF  CARE:  Reviewed eval findings and role of PT in addressing identified deficits as well as reducing risk for falls.    PATIENT EDUCATION:  Education details: PT eval findings and anticipated POC  Person educated: Patient Education method: Explanation Education comprehension: verbalized understanding  HOME EXERCISE PROGRAM: Access Code: [email or web address] URL: [email or web address] Date: 03/20/2024 Prepared by: Cherylene Corrente  Exercises - Standing March with Counter Support  - 1 x daily - 7 x weekly - 2 sets - 10 reps - 3 hold - Standing Hip Abduction with Unilateral Counter Support  - 1 x daily - 7 x weekly - 2 sets - 10 reps - 3 hold - Standing Hip Extension with Unilateral Counter Support  - 1 x daily - 7 x weekly - 2 sets - 10 reps - 3 hold - Heel Toe Raises with Unilateral Counter Support  - 1 x daily - 7 x weekly - 2 sets - 10 reps - 3 hold   ASSESSMENT:  CLINICAL IMPRESSION: Initiated exercxises with Mr. Malnar, he needed cues for posture and form, some cues to slow down, He does have some heavy and strained breathing with exercises, reports fatigue with the exercises and wants to have stronger legs and better posture and be able to get up off the ground.    DEJAN CARNEVALE is a 89 y.o. male who was referred to physical therapy for evaluation and treatment for B LE weakness.  Patient presents with physical impairments of LE weakness, impaired activity tolerance, impaired standing balance, and impaired ambulation impacting safe and independent functional mobility.  Examination revealed patient is at risk for falls and functional decline as evidenced by the following objective test measures: Gait speed 2.16 ft/sec, (2.62 ft/sec is needed for community access), TUG of 13.34 sec (>13.5 sec indicates increased risk for falls), 5xSTS of 18.47 sec (>15 sec indicates increased risk for falls and decreased BLE power) and FGA of 19/30 (19-24 = medium risk fall).   Olis will benefit from  skilled PT to address above deficits to improve mobility and activity tolerance to help reach the maximal level of functional independence and mobility. Patient demonstrates understanding of this POC and is in agreement with this plan.   OBJECTIVE IMPAIRMENTS: Abnormal gait, decreased activity tolerance, decreased balance, decreased endurance, decreased knowledge of condition, decreased mobility, difficulty walking, decreased ROM, decreased strength, increased fascial restrictions, impaired perceived functional ability, impaired flexibility, improper body mechanics, and postural dysfunction.   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   REHAB POTENTIAL: Good  CLINICAL DECISION MAKING: Evolving/moderate complexity  EVALUATION COMPLEXITY: Moderate   GOALS: Goals reviewed with patient? Yes  SHORT TERM GOALS: Target date: 04/29/2024  Patient will be independent with initial HEP to improve outcomes and carryover.  Baseline:  Goal status: ongpoing provided 03/20/24  2.  Patient will be educated on strategies to decrease risk of falls.  Baseline:  Goal status: INITIAL  3.  Patient will demonstrate decreased 5xSTS time to </= 15 sec to decrease risk for falls with transitional mobility. Baseline: 18.47 sec Goal status: INITIAL  LONG TERM GOALS: Target date: 06/10/2024  Patient will be independent with advanced/ongoing HEP to facilitate ability to maintain/progress functional gains from skilled physical therapy  services. Baseline:  Goal status: INITIAL  2.  Patient will be able to ambulate 600' w/o AD on variable surfaces with good safety and no DOE to access community.  Baseline: DOE with ambulation w/in clinic Goal status: INITIAL  3.  Patient will be able to independently transition from floor/ground to stand to allow for increased safety when working in his garden.  Baseline: difficulty with getting up from the ground Goal status: INITIAL   4.  Patient will demonstrate improved B LE strength to >/= 4+/5 for improved stability and ease of mobility . Baseline: Refer to above LE MMT table Goal status: INITIAL  5.  Patient will demonstrate gait speed of >/= 2.62 ft/sec (0.8 m/s) to be a safe community ambulator with decreased risk for recurrent falls.  Baseline: 2.16 ft/sec Goal status: INITIAL  6.  Patient will improve FGA score to >/= 23/30 to improve gait stability and reduce risk for falls. Baseline: 19/30 Goal status: INITIAL  7.  Patient will report >/= 49/80 on LEFS to demonstrate improved functional activity. Baseline: 40 / 80 = 50.0 % Goal status: INITIAL   PLAN:  PT FREQUENCY: 1-2x/week - 2x/wk initially, potentially tapering to 1x/wk  PT DURATION: 8-12 weeks  PLANNED INTERVENTIONS: 97164- PT Re-evaluation, 97750- Physical Performance Testing, 97110-Therapeutic exercises, 97530- Therapeutic activity, [id number]- Neuromuscular re-education, 97535- Self Care, [id number]- Manual therapy, [phone]- Gait training, [phone]- Aquatic Therapy, Patient/Family education, Balance training, Stair training, Taping, Dry Needling, Joint mobilization, and Spinal mobilization  PLAN  FOR NEXT SESSION: see if he was able to fine HEP on phone, is it okay, add tband postural exercises and continue with LE and endurance   Zan Orlick W, PT 03/20/2024, 8:42 AM

## 2024-03-25 ENCOUNTER — Ambulatory Visit

## 2024-03-25 DIAGNOSIS — R2681 Unsteadiness on feet: Secondary | ICD-10-CM | POA: Diagnosis not present

## 2024-03-25 DIAGNOSIS — M6281 Muscle weakness (generalized): Secondary | ICD-10-CM

## 2024-03-25 DIAGNOSIS — R262 Difficulty in walking, not elsewhere classified: Secondary | ICD-10-CM | POA: Diagnosis not present

## 2024-03-25 NOTE — Therapy (Signed)
 OUTPATIENT PHYSICAL THERAPY TREATMENT   Patient Name: SELIG Scott MRN: [id number] DOB:Mar 28, 1932, 89 y.o., male Today's Date: 03/25/2024   END OF SESSION:  PT End of Session - 03/25/24 1711     Visit Number 3    Date for PT Re-Evaluation 06/10/24    Authorization Type Medicare & AARP Supplement    PT Start Time 1607    PT Stop Time 1653    PT Time Calculation (min) 46 min    Activity Tolerance Patient tolerated treatment well    Behavior During Therapy WFL for tasks assessed/performed              Past Medical History:  Diagnosis Date   Aortic aneurysm (HCC)    Aortic stenosis    CAD (coronary artery disease)    Diabetes mellitus type II, controlled (HCC)    Heart disease    Ablation   History of chicken pox    Hyperlipidemia    Hypertension    S/P CABG x 4    2001 - [place]    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   26 mm Edwards Sapien 3 transcatheter heart valve placed via percutaneous right transfemoral approach    Umbilical hernia    Past Surgical History:  Procedure Laterality Date   ABLATION     Rhythm problem; rhythm unknown; [place]   CARDIOVERSION N/A 07/14/2018   Procedure: CARDIOVERSION;  Surgeon: Peter Riggers, MD;  Location: [facility] ENDOSCOPY;  Service: Cardiovascular;  Laterality: N/A;   CARDIOVERSION N/A 08/27/2018   Procedure: CARDIOVERSION;  Surgeon: Peter Madura, MD;  Location: [facility] ENDOSCOPY;  Service: Cardiovascular;  Laterality: N/A;   CORONARY ARTERY BYPASS GRAFT  2001   EXPLORATION POST OPERATIVE OPEN HEART  2001, June   INTRAOPERATIVE TRANSTHORACIC ECHOCARDIOGRAM N/A 04/01/2018   Procedure: INTRAOPERATIVE TRANSTHORACIC ECHOCARDIOGRAM;  Surgeon: Peter Benne, MD;  Location: [facility] OR;  Service: Open Heart Surgery;  Laterality: N/A;   RIGHT/LEFT HEART CATH AND CORONARY/GRAFT ANGIOGRAPHY N/A 01/31/2018   Procedure: RIGHT/LEFT HEART CATH AND CORONARY/GRAFT ANGIOGRAPHY;  Surgeon: Peter Benne, MD;   Location: [facility] INVASIVE CV LAB;  Service: Cardiovascular;  Laterality: N/A;   TEE WITHOUT CARDIOVERSION N/A 01/17/2018   Procedure: TRANSESOPHAGEAL ECHOCARDIOGRAM (TEE);  Surgeon: Peter Quince, MD;  Location: [facility] ENDOSCOPY;  Service: Cardiovascular;  Laterality: N/A;   TEE WITHOUT CARDIOVERSION N/A 07/14/2018   Procedure: TRANSESOPHAGEAL ECHOCARDIOGRAM (TEE);  Surgeon: Peter Riggers, MD;  Location: [facility] ENDOSCOPY;  Service: Cardiovascular;  Laterality: N/A;   TONSILLECTOMY     TRANSCATHETER AORTIC VALVE REPLACEMENT, TRANSFEMORAL N/A 04/01/2018   Procedure: TRANSCATHETER AORTIC VALVE REPLACEMENT, TRANSFEMORAL;  Surgeon: Peter Benne, MD;  Location: [facility] OR;  Service: Open Heart Surgery;  Laterality: N/A;   UMBILICAL HERNIA REPAIR  2015   Patient Active Problem List   Diagnosis Date Noted   Paroxysmal atrial fibrillation (HCC)    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   S/P CABG x 4    Severe aortic stenosis    Medicare annual wellness visit, subsequent 10/04/2014   Bruit 09/29/2014   Abdominal aortic aneurysm (HCC) 07/23/2014   BCC (basal cell carcinoma) 07/23/2014   SCC (squamous cell carcinoma), arm 07/23/2014   Essential hypertension, benign 07/08/2014   Coronary artery disease 07/08/2014   Hyperlipidemia LDL goal <100 07/08/2014   Type II diabetes mellitus, well controlled (HCC) 07/08/2014   Colon cancer screening 07/08/2014    PCP: Copland, Skipper Dumas, MD   REFERRING PROVIDER: Kaylee Partridge, MD  REFERRING DIAG: R29.898 (ICD-10-CM) - Leg weakness, bilateral   THERAPY DIAG:  Muscle weakness (generalized)  Difficulty in walking, not elsewhere classified  Unsteadiness on feet  RATIONALE FOR EVALUATION AND TREATMENT: Rehabilitation  ONSET DATE:  worsening over the past 3-4 yrs  NEXT MD VISIT:  None scheduled    SUBJECTIVE:                                                                                                                                                                                                          SUBJECTIVE STATEMENT: 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  From Eval:  Pt reports good upper body strength but notes weakness and fatigue with lower body activities.  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  He stays very active but feels limited with standing activities or activities where he has to lift things.  He denies pain limitations or limitations due to balance.  PAIN: Are you having pain? No  PERTINENT HISTORY:  Aortic aneurysm, aortic stenosis, CAD s/p CABG x 4 (2001), DM-II, s/p TAVR (2019), paroxysmal afib s/p ablation, HTN, s/p umbilical hernia repair  PRECAUTIONS: Fall  RED FLAGS: None  WEIGHT BEARING RESTRICTIONS: No  FALLS:  Has patient fallen in last 6 months? 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 Be able to stand and squat."   OBJECTIVE: (objective measures completed at initial evaluation unless otherwise dated)  DIAGNOSTIC FINDINGS:  N/A  COGNITION: Overall cognitive status: Within functional limits for tasks assessed   SENSATION: WFL  POSTURE:  rounded shoulders, forward head, and flexed trunk   MUSCLE LENGTH: Hamstrings: mod/severe tight B ITB: mild tight/WFL Piriformis: mod tight B Hip flexors: mod tight B Quads: mild/mod tight B Heelcord: NT  LOWER  EXTREMITY ROM:    WFL other than limitations related to muscle tightness as indicated above  LOWER EXTREMITY MMT:    MMT Right eval Left eval  Hip flexion 4 4  Hip extension 3+ 3+  Hip abduction 4- 4-  Hip adduction 4 4  Hip internal rotation 4 4  Hip external rotation 4- 4-  Knee flexion 4+ 4+  Knee extension 4+ 4+  Ankle dorsiflexion 4- 4-  Ankle plantarflexion 4 (14 SLS HR) 4 (15 SLS HR)  Ankle inversion    Ankle eversion    (Blank rows = not tested)  BED MOBILITY:  Independent  TRANSFERS: Assistive device utilized: None  Sit to stand: Complete Independence Stand to sit: Complete Independence Chair to chair: Modified independence Floor:  NT  GAIT: Distance walked: clinic distances Assistive device utilized: None Level of assistance: Complete Independence Gait pattern: trunk flexed Comments: fatigue with increased SOB/DOE noted during ambulation for FGA  STAIRS:  Level of Assistance: Modified independence  Stair Negotiation Technique: Alternating Pattern  with Single Rail on Right  Number of Stairs: 14   Height of Stairs: 7"  Comments: increased SOB due to fatigue after attempt  FUNCTIONAL TESTS:  5 times sit to stand: 18.47 sec Timed up and go (TUG): 13.34 sec 10 meter walk test: 15.16 sec; Gait speed = 2.16 ft/sec  Functional gait assessment: 19/30; 19-24 = medium risk fall  PATIENT SURVEYS:  LEFS 40 / 80 = 50.0 %; 41-60: Mild to moderate functional limitation   TODAY'S TREATMENT:  03/25/24 Nustep L5x29min Therapeutic Activity: to improve functional performance. 15lb unilateral farmer carry 180 ft each hand Squat with 10lb kettle bell x 10 STS + OHP x 10 with 1lb weight- fatigued Review of fall prevention and safety NEUROMUSCULAR RE-EDUCATION: To improve proprioception, balance, and kinesthesia. Standing rows and shoulder extension RTB x 15 Standing hip abduction to extension 3lb x 10 BLE Standing straight knee hip flexion  3lb x  10BLE  03/20/24 Nustep level 5 x 6 minutes Resisted gait in the hall all directions 15# farmer carry 150 feet each hand 10# STS with OHP x10, then x5  3# standing with walker marches, hip abduction and extension 2x10 HEP as noted below needed a lot of help with his phone to get to this, did not know password so could not download the app, had to save in his messages to get there.  Reviewed and performed     03/18/2024 - Eval SELF CARE:  Reviewed eval findings and role of PT in addressing identified deficits as well as reducing risk for falls.    PATIENT EDUCATION:  Education details: HEP update - rows and shoulder extension   Person educated: Patient Education method: Explanation Education comprehension: verbalized understanding  HOME EXERCISE PROGRAM: Access Code: [email or web address] URL: [email or web address] Date: 03/25/2024 Prepared by: Tarik Teixeira  Exercises - Standing March with Counter Support  - 1 x daily - 7 x weekly - 2 sets - 10 reps - 3 hold - Standing Hip Abduction with Unilateral Counter Support  - 1 x daily - 7 x weekly - 2 sets - 10 reps - 3 hold - Standing Hip Extension with Unilateral Counter Support  - 1 x daily - 7 x weekly - 2 sets - 10 reps - 3 hold - Heel Toe Raises with Unilateral Counter Support  - 1 x daily - 7 x weekly - 2 sets - 10 reps - 3 hold - Standing Shoulder Row with Anchored Resistance  - 1 x daily - 7 x weekly - 2 sets - 10 reps - Shoulder Extension with Anchored Resistance  - 1 x daily - 7 x weekly - 2 sets -  10 reps   ASSESSMENT:  CLINICAL IMPRESSION: Pt responded well to treatment. Continued to work on functional strengthening and initiated postural strengthening. Cues throughout session for upright posture especially with standing exercises. Labored breathing throughout session and rest breaks taken throughout. Reviewed fall prevention handout with him post visit. Provided postural strengthening in HEP as well.    Peter Scott is a  89 y.o. male who was referred to physical therapy for evaluation and treatment for B LE weakness.  Patient presents with physical impairments of LE weakness, impaired activity tolerance, impaired standing balance, and impaired ambulation impacting safe and independent functional mobility.  Examination revealed patient is at risk for falls and functional decline as evidenced by the following objective test measures: Gait speed 2.16 ft/sec, (2.62 ft/sec is needed for community access), TUG of 13.34 sec (>13.5 sec indicates increased risk for falls), 5xSTS of 18.47 sec (>15 sec indicates increased risk for falls and decreased BLE power) and FGA of 19/30 (19-24 = medium risk fall).   Jacarie will benefit from skilled PT to address above deficits to improve mobility and activity tolerance to help reach the maximal level of functional independence and mobility. Patient demonstrates understanding of this POC and is in agreement with this plan.   OBJECTIVE IMPAIRMENTS: Abnormal gait, decreased activity tolerance, decreased balance, decreased endurance, decreased knowledge of condition, decreased mobility, difficulty walking, decreased ROM, decreased strength, increased fascial restrictions, impaired perceived functional ability, impaired flexibility, improper body mechanics, and postural dysfunction.   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   REHAB POTENTIAL: Good  CLINICAL DECISION MAKING: Evolving/moderate complexity  EVALUATION COMPLEXITY:  Moderate   GOALS: Goals reviewed with patient? Yes  SHORT TERM GOALS: Target date: 04/29/2024  Patient will be independent with initial HEP to improve outcomes and carryover.  Baseline:  Goal status: ongpoing provided 03/20/24  2.  Patient will be educated on strategies to decrease risk of falls.  Baseline:  Goal status: INITIAL  3.  Patient will demonstrate decreased 5xSTS time to </= 15 sec to decrease risk for falls with transitional mobility. Baseline: 18.47 sec Goal status: INITIAL  LONG TERM GOALS: Target date: 06/10/2024  Patient will be independent with advanced/ongoing HEP to facilitate ability to maintain/progress functional gains from skilled physical therapy services. Baseline:  Goal status: INITIAL  2.  Patient will be able to ambulate 600' w/o AD on variable surfaces with good safety and no DOE to access community.  Baseline: DOE with ambulation w/in clinic Goal status: INITIAL  3.  Patient will be able to independently transition from floor/ground to stand to allow for increased safety when working in his garden.  Baseline: difficulty with getting up from the ground Goal status: INITIAL   4.  Patient will demonstrate improved B LE strength to >/= 4+/5 for improved stability and ease of mobility . Baseline: Refer to above LE MMT table Goal status: INITIAL  5.  Patient will demonstrate gait speed of >/= 2.62 ft/sec (0.8 m/s) to be a safe community ambulator with decreased risk for recurrent falls.  Baseline: 2.16 ft/sec Goal status: INITIAL  6.  Patient will improve FGA score  to >/= 23/30 to improve gait stability and reduce risk for falls. Baseline: 19/30 Goal status: INITIAL  7.  Patient will report >/= 49/80 on LEFS to demonstrate improved functional activity. Baseline: 40 / 80 = 50.0 % Goal status: INITIAL   PLAN:  PT FREQUENCY: 1-2x/week - 2x/wk initially, potentially tapering to 1x/wk  PT DURATION: 8-12 weeks  PLANNED INTERVENTIONS: [id number]- PT  Re-evaluation, 97750- Physical Performance Testing, 97110-Therapeutic exercises, 97530- Therapeutic activity, 97112- Neuromuscular re-education, 97535- Self Care, [id number]- Manual therapy, [phone]- Gait training, [phone]- Aquatic Therapy, Patient/Family education, Balance training, Stair training, Taping, Dry Needling, Joint mobilization, and Spinal mobilization  PLAN FOR NEXT SESSION: postural exercises and continue with LE and endurance   Ezzie Senat L Keylee Shrestha, PTA 03/25/2024, 5:20 PM

## 2024-03-27 ENCOUNTER — Ambulatory Visit: Admitting: Physical Therapy

## 2024-03-27 ENCOUNTER — Encounter: Payer: Self-pay | Admitting: Physical Therapy

## 2024-03-27 DIAGNOSIS — M6281 Muscle weakness (generalized): Secondary | ICD-10-CM

## 2024-03-27 DIAGNOSIS — R262 Difficulty in walking, not elsewhere classified: Secondary | ICD-10-CM

## 2024-03-27 DIAGNOSIS — R2681 Unsteadiness on feet: Secondary | ICD-10-CM | POA: Diagnosis not present

## 2024-03-27 NOTE — Therapy (Signed)
 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   26 mm Edwards Sapien 3 transcatheter heart valve placed via percutaneous right transfemoral approach    Umbilical hernia    Past Surgical History:  Procedure Laterality Date   ABLATION     Rhythm problem; rhythm unknown; [place]   CARDIOVERSION N/A 07/14/2018   Procedure: CARDIOVERSION;  Surgeon: Liza Riggers, MD;  Location: [facility] ENDOSCOPY;  Service: Cardiovascular;  Laterality: N/A;   CARDIOVERSION N/A 08/27/2018   Procedure: CARDIOVERSION;  Surgeon: Hugh Madura, MD;  Location: [facility] ENDOSCOPY;  Service: Cardiovascular;  Laterality: N/A;   CORONARY ARTERY BYPASS GRAFT  2001   EXPLORATION POST OPERATIVE OPEN HEART  2001, June   INTRAOPERATIVE TRANSTHORACIC ECHOCARDIOGRAM N/A 04/01/2018   Procedure: INTRAOPERATIVE TRANSTHORACIC ECHOCARDIOGRAM;  Surgeon: Odie Benne, MD;  Location: [facility] OR;  Service: Open Heart Surgery;  Laterality: N/A;   RIGHT/LEFT HEART CATH AND CORONARY/GRAFT ANGIOGRAPHY N/A 01/31/2018   Procedure: RIGHT/LEFT HEART CATH AND CORONARY/GRAFT ANGIOGRAPHY;  Surgeon: Odie Benne, MD;  Location: [facility] INVASIVE CV LAB;  Service: Cardiovascular;  Laterality: N/A;   TEE WITHOUT CARDIOVERSION N/A 01/17/2018   Procedure: TRANSESOPHAGEAL ECHOCARDIOGRAM (TEE);  Surgeon: Lenise Quince, MD;  Location: [facility] ENDOSCOPY;  Service: Cardiovascular;  Laterality: N/A;   TEE WITHOUT CARDIOVERSION N/A 07/14/2018   Procedure: TRANSESOPHAGEAL  ECHOCARDIOGRAM (TEE);  Surgeon: Liza Riggers, MD;  Location: [facility] ENDOSCOPY;  Service: Cardiovascular;  Laterality: N/A;   TONSILLECTOMY     TRANSCATHETER AORTIC VALVE REPLACEMENT, TRANSFEMORAL N/A 04/01/2018   Procedure: TRANSCATHETER AORTIC VALVE REPLACEMENT, TRANSFEMORAL;  Surgeon: Odie Benne, MD;  Location: [facility] OR;  Service: Open Heart Surgery;  Laterality: N/A;   UMBILICAL HERNIA REPAIR  2015   Patient Active Problem List   Diagnosis Date Noted   Paroxysmal atrial fibrillation (HCC)    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   S/P CABG x 4    Severe aortic stenosis    Medicare annual wellness visit, subsequent 10/04/2014   Bruit 09/29/2014   Abdominal aortic aneurysm (HCC) 07/23/2014   BCC (basal cell carcinoma) 07/23/2014   SCC (squamous cell carcinoma), arm 07/23/2014   Essential hypertension, benign 07/08/2014   Coronary artery disease 07/08/2014   Hyperlipidemia LDL goal <100 07/08/2014   Type II diabetes mellitus, well controlled (HCC) 07/08/2014   Colon cancer screening 07/08/2014    PCP: Copland, Skipper Dumas, MD   REFERRING PROVIDER: Kaylee Partridge, MD   REFERRING DIAG: R29.898 (ICD-10-CM) - Leg weakness, bilateral   THERAPY DIAG:  Muscle weakness (generalized)  Difficulty in walking, not elsewhere classified  Unsteadiness on feet  RATIONALE FOR EVALUATION AND TREATMENT: Rehabilitation  ONSET DATE:  worsening over the past 3-4 yrs  NEXT MD VISIT:  None scheduled    SUBJECTIVE:  SUBJECTIVE STATEMENT: HEP "is going like it is supposed to, I hope."  From Eval:  Pt reports good upper body strength but notes weakness and fatigue with lower body activities.  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  He stays very active but feels limited with standing activities or activities where he has to lift things.  He denies pain limitations or limitations due to balance.  PAIN: Are you having pain? No  PERTINENT HISTORY:  Aortic aneurysm, aortic stenosis, CAD s/p CABG x 4 (2001), DM-II, s/p TAVR (2019), paroxysmal afib s/p ablation, HTN, s/p umbilical hernia repair  PRECAUTIONS: Fall  RED FLAGS: None  WEIGHT BEARING RESTRICTIONS: No  FALLS:  Has patient fallen in last 6 months? 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 Be able to stand and squat."   OBJECTIVE: (objective measures completed at initial evaluation unless otherwise dated)  DIAGNOSTIC FINDINGS:  N/A  COGNITION: Overall cognitive status: Within functional limits for tasks assessed   SENSATION: WFL  POSTURE:  rounded shoulders, forward head, and flexed trunk   MUSCLE LENGTH: Hamstrings: mod/severe tight B ITB: mild tight/WFL Piriformis: mod tight B Hip flexors: mod tight B Quads: mild/mod tight B Heelcord: NT  LOWER EXTREMITY ROM:    WFL other than limitations related to muscle tightness as indicated above  LOWER EXTREMITY MMT:    MMT Right eval Left eval  Hip flexion 4 4  Hip extension 3+ 3+  Hip abduction 4- 4-  Hip adduction 4 4  Hip internal rotation 4 4  Hip external rotation 4- 4-  Knee flexion 4+ 4+  Knee extension 4+ 4+  Ankle dorsiflexion 4- 4-  Ankle plantarflexion  4 (14 SLS HR) 4 (15 SLS HR)  Ankle inversion    Ankle eversion    (Blank rows = not tested)  BED MOBILITY:  Independent  TRANSFERS: Assistive device utilized: None  Sit to stand: Complete Independence Stand to sit: Complete Independence Chair to chair: Modified independence Floor: NT  GAIT: Distance walked: clinic distances Assistive device utilized: None Level of assistance: Complete Independence Gait pattern: trunk flexed Comments: fatigue with increased SOB/DOE noted during ambulation for FGA  STAIRS:  Level of Assistance: Modified independence  Stair Negotiation Technique: Alternating Pattern  with Single Rail on Right  Number of Stairs: 14   Height of Stairs: 7"  Comments: increased SOB due to fatigue after attempt  FUNCTIONAL TESTS:  5 times sit to stand: 18.47 sec Timed up and go (TUG): 13.34 sec 10 meter walk test: 15.16 sec; Gait speed = 2.16 ft/sec  Functional gait assessment: 19/30; 19-24 = medium risk fall  PATIENT SURVEYS:  LEFS 40 / 80 = 50.0 %; 41-60: Mild to moderate functional limitation   TODAY'S TREATMENT:   03/27/24 THERAPEUTIC EXERCISE: To improve strength, endurance, and flexibility.  Demonstration, verbal and tactile cues throughout for technique.  NuStep - L5 x 6 min  Mod  thomas quad/hip flexor stretch 3 x 30" bil  THERAPEUTIC ACTIVITIES: To improve functional performance.  Demonstration, verbal and tactile cues throughout for technique. Tall kneel hip hinge (knees on Airex pad on yoga mat with pillow behind knee between glutes and feet) x 10 Quadruped press-up to tall knee x 10 Quadruped alt step to side x 10 Quadruped alt step to side + press-up to 1/2 kneel x 10  NEUROMUSCULAR RE-EDUCATION: To improve balance, coordination, kinesthesia, and posture. Supported squat with glute touch to mat table x 10 Lunge x 10 bil, single UE support on back of chair - cues for upright posture, limited by quad/hip flexor tightness   03/25/24 Nustep  L5x46min Therapeutic Activity: to improve functional performance. 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 Standing rows and shoulder extension RTB x 15 Standing hip abduction to extension 3lb x 10 BLE Standing straight knee hip flexion  3lb x 10BLE   03/20/24 Nustep level 5 x 6 minutes Resisted gait in the hall all directions 15# farmer carry 150 feet each hand 10# STS with OHP x10, then x5  3# standing with walker marches, hip abduction and extension 2x10 HEP as noted below needed a lot of help with his phone to get to this, did not know password so could not download the app, had to save in his messages to get there.  Reviewed and performed    PATIENT EDUCATION:  Education details: HEP update - quad/hip flexor stretch  Person educated: Patient Education method: Explanation, Demonstration, Verbal cues, and MedBridge[employer] app updated Education comprehension: verbalized understanding, returned demonstration, and verbal cues required  HOME EXERCISE PROGRAM: *Pt using [employer] app.  Access Code: [email or web address] URL: [email or web address] Date: 03/25/2024 Prepared by: Braylin Clark  Exercises - Standing March with Counter Support  - 1 x daily - 7 x weekly - 2 sets - 10 reps - 3 hold - Standing Hip Abduction with Unilateral Counter Support  - 1 x daily - 7 x weekly - 2 sets - 10 reps - 3 hold - Standing Hip Extension with Unilateral Counter Support  - 1 x daily - 7 x weekly - 2 sets - 10 reps - 3 hold - Heel Toe Raises with Unilateral Counter Support  - 1 x daily - 7 x weekly - 2 sets - 10 reps - 3 hold - Standing Shoulder Row with Anchored Resistance  - 1 x daily - 7 x weekly - 2 sets - 10 reps - Shoulder Extension with Anchored Resistance  - 1 x daily - 7 x weekly - 2 sets - 10  reps   ASSESSMENT:  CLINICAL IMPRESSION: Peter Scott feels his HEP is going well and denies need for review today.  Treatment session focusing on increasing ease of floor/ground to stand transitions for when he is working in the garden. Worked through strengthening in functional progressing working from quadruped/tall kneeling through 1/2 kneeling and eventually into floor to stand transition.  Pt limited by quad/hip flexor tightness with lunges, therefore added stretch to HEP.  Peter Scott will benefit from continued skilled PT to address ongoing flexibility, strength and balance deficits to improve mobility and activity tolerance with decreased risk for falls.   Peter Scott is a 89 y.o. male who was referred to physical therapy for evaluation and treatment for B LE weakness.  Patient presents with physical impairments of LE weakness, impaired activity tolerance, impaired standing balance, and impaired ambulation impacting safe and independent functional mobility.  Examination revealed patient is at risk for falls and functional decline as evidenced by the following objective test measures: Gait speed 2.16 ft/sec, (2.62 ft/sec is needed for community access), TUG of 13.34 sec (>13.5 sec indicates increased risk for falls), 5xSTS of 18.47 sec (>15 sec indicates increased risk for falls and decreased BLE power) and FGA of 19/30 (19-24 = medium risk fall).   Peter Scott will benefit from skilled PT to address above deficits to improve mobility and activity tolerance to help reach the maximal level of functional independence and mobility. Patient demonstrates understanding of this POC and is in agreement with this plan.   OBJECTIVE IMPAIRMENTS: Abnormal gait, decreased activity tolerance, decreased balance, decreased endurance, decreased knowledge of condition, decreased mobility, difficulty walking, decreased ROM, decreased strength, increased fascial restrictions, impaired perceived functional ability, impaired flexibility,  improper body mechanics, and postural dysfunction.   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   REHAB POTENTIAL: Good  CLINICAL DECISION MAKING: Evolving/moderate complexity  EVALUATION COMPLEXITY: Moderate   GOALS: Goals reviewed with patient? Yes  SHORT TERM GOALS: Target date: 04/29/2024  Patient will be independent with initial HEP to improve outcomes and carryover.  Baseline:  Goal status: IN PROGRESS - provided 03/20/24  2.  Patient will be educated on strategies to decrease risk of falls.  Baseline:  Goal status: INITIAL  3.  Patient will demonstrate decreased 5xSTS time to </= 15 sec to decrease risk for falls with transitional mobility. Baseline: 18.47 sec Goal status: INITIAL  LONG TERM GOALS: Target date: 06/10/2024  Patient will be independent with advanced/ongoing HEP to facilitate ability to maintain/progress functional gains from skilled physical therapy services. Baseline:  Goal status: INITIAL  2.  Patient will be able to ambulate 600' w/o AD on variable surfaces with good safety and no DOE to access community.  Baseline: DOE with ambulation w/in clinic Goal status: INITIAL  3.  Patient will be able to independently transition from floor/ground to stand to allow for increased safety when working in his garden.  Baseline: difficulty with getting up from the ground Goal status: INITIAL   4.  Patient will demonstrate improved B LE strength to >/= 4+/5 for improved stability and ease of mobility  . Baseline: Refer to above LE MMT table Goal status: INITIAL  5.  Patient will demonstrate gait speed of >/= 2.62 ft/sec (0.8 m/s) to be a safe community ambulator with decreased risk for recurrent falls.  Baseline: 2.16 ft/sec Goal status: INITIAL  6.  Patient will improve FGA score to >/= 23/30 to improve gait stability and reduce risk for falls. Baseline: 19/30 Goal status: INITIAL  7.  Patient will report >/= 49/80 on LEFS to demonstrate improved functional activity. Baseline: 40 / 80 = 50.0 % Goal status: INITIAL   PLAN:  PT FREQUENCY: 1-2x/week - 2x/wk initially, potentially tapering to 1x/wk  PT DURATION: 8-12 weeks  PLANNED INTERVENTIONS: [id number]- PT Re-evaluation, 97750- Physical Performance Testing, 97110-Therapeutic exercises, 97530- Therapeutic activity, [id number]- Neuromuscular re-education, 97535- Self Care,  [id number]- Manual therapy, [id number]- Gait training, [id number]- Aquatic Therapy, Patient/Family education, Balance training, Stair training, Taping, Dry Needling, Joint mobilization, and Spinal mobilization  PLAN FOR NEXT SESSION: postural exercises and continue with LE and endurance   Francisco Irving, PT 03/27/2024, 9:31 AM

## 2024-03-30 ENCOUNTER — Encounter: Payer: Self-pay | Admitting: Physical Therapy

## 2024-03-30 ENCOUNTER — Ambulatory Visit: Admitting: Physical Therapy

## 2024-03-30 DIAGNOSIS — R2681 Unsteadiness on feet: Secondary | ICD-10-CM

## 2024-03-30 DIAGNOSIS — R262 Difficulty in walking, not elsewhere classified: Secondary | ICD-10-CM | POA: Diagnosis not present

## 2024-03-30 DIAGNOSIS — M6281 Muscle weakness (generalized): Secondary | ICD-10-CM

## 2024-03-30 NOTE — Therapy (Signed)
 OUTPATIENT PHYSICAL THERAPY TREATMENT   Patient Name: Peter Scott MRN: [id number] DOB:[birth year], 89 y.o., male Today's Date: 03/30/2024   END OF SESSION:  PT End of Session - 03/30/24 0847     Visit Number 5    Date for PT Re-Evaluation 06/10/24    Authorization Type Medicare & AARP Supplement    PT Start Time 0847    PT Stop Time 0929    PT Time Calculation (min) 42 min    Activity Tolerance Patient tolerated treatment well    Behavior During Therapy [facility] 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   26 mm Edwards Sapien 3 transcatheter heart valve placed via percutaneous right transfemoral approach    Umbilical hernia    Past Surgical History:  Procedure Laterality Date   ABLATION     Rhythm problem; rhythm unknown; [place]   CARDIOVERSION N/A 07/14/2018   Procedure: CARDIOVERSION;  Surgeon: Liza Riggers, MD;  Location: [facility] ENDOSCOPY;  Service: Cardiovascular;  Laterality: N/A;   CARDIOVERSION N/A 08/27/2018   Procedure: CARDIOVERSION;  Surgeon: Hugh Madura, MD;  Location: [facility] ENDOSCOPY;  Service: Cardiovascular;  Laterality: N/A;   CORONARY ARTERY BYPASS GRAFT  2001   EXPLORATION POST OPERATIVE OPEN HEART  2001, June   INTRAOPERATIVE TRANSTHORACIC ECHOCARDIOGRAM N/A 04/01/2018   Procedure: INTRAOPERATIVE TRANSTHORACIC ECHOCARDIOGRAM;  Surgeon: Odie Benne, MD;  Location: [facility] OR;  Service: Open Heart Surgery;  Laterality: N/A;   RIGHT/LEFT HEART CATH AND CORONARY/GRAFT ANGIOGRAPHY N/A 01/31/2018   Procedure: RIGHT/LEFT HEART CATH AND CORONARY/GRAFT ANGIOGRAPHY;  Surgeon: Odie Benne,  MD;  Location: [facility] INVASIVE CV LAB;  Service: Cardiovascular;  Laterality: N/A;   TEE WITHOUT CARDIOVERSION N/A 01/17/2018   Procedure: TRANSESOPHAGEAL ECHOCARDIOGRAM (TEE);  Surgeon: Lenise Quince, MD;  Location: [facility] ENDOSCOPY;  Service: Cardiovascular;  Laterality: N/A;   TEE WITHOUT CARDIOVERSION N/A 07/14/2018   Procedure: TRANSESOPHAGEAL ECHOCARDIOGRAM (TEE);  Surgeon: Liza Riggers, MD;  Location: [facility] ENDOSCOPY;  Service: Cardiovascular;  Laterality: N/A;   TONSILLECTOMY     TRANSCATHETER AORTIC VALVE REPLACEMENT, TRANSFEMORAL N/A 04/01/2018   Procedure: TRANSCATHETER AORTIC VALVE REPLACEMENT, TRANSFEMORAL;  Surgeon: Odie Benne, MD;  Location: [facility] OR;  Service: Open Heart Surgery;  Laterality: N/A;   UMBILICAL HERNIA REPAIR  2015   Patient Active Problem List   Diagnosis Date Noted   Paroxysmal atrial fibrillation (HCC)    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   S/P CABG x 4    Severe aortic stenosis    Medicare annual wellness visit, subsequent 10/04/2014   Bruit 09/29/2014   Abdominal aortic aneurysm (HCC) 07/23/2014   BCC (basal cell carcinoma) 07/23/2014   SCC (squamous cell carcinoma), arm 07/23/2014   Essential hypertension, benign 07/08/2014   Coronary artery disease 07/08/2014   Hyperlipidemia LDL goal <100 07/08/2014   Type II diabetes mellitus, well controlled (HCC) 07/08/2014   Colon cancer screening 07/08/2014    PCP: Kaylee Partridge, MD   REFERRING PROVIDER: Kaylee Partridge,  MD   REFERRING DIAG: R29.898 (ICD-10-CM) - Leg weakness, bilateral   THERAPY DIAG:  No diagnosis found.  RATIONALE FOR EVALUATION AND TREATMENT: Rehabilitation  ONSET DATE:  worsening over the past 3-4 yrs  NEXT MD VISIT:  None scheduled    SUBJECTIVE:                                                                                                                                                                                                          SUBJECTIVE STATEMENT: Peter Scott reports his legs felt like "noodles" walking to the car after his last PT visit but feels like he was moving better over the weekend.  From Eval:  Pt reports good upper body strength but notes weakness and fatigue with lower body activities.  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  He stays very active but feels limited with standing activities or activities where he has to lift things.  He denies pain limitations or limitations due to balance.  PAIN: Are you having pain? No  PERTINENT HISTORY:  Aortic aneurysm, aortic stenosis, CAD s/p CABG x 4 (2001), DM-II, s/p TAVR (2019), paroxysmal afib s/p ablation, HTN, s/p umbilical hernia repair  PRECAUTIONS: Fall  RED FLAGS: None  WEIGHT BEARING RESTRICTIONS: No  FALLS:  Has patient fallen in last 6 months? 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 Be able to stand and squat."   OBJECTIVE: (objective measures completed at initial evaluation unless otherwise dated)  DIAGNOSTIC FINDINGS:  N/A  COGNITION: Overall cognitive status: Within functional limits for tasks assessed   SENSATION: WFL  POSTURE:  rounded shoulders, forward head, and flexed trunk   MUSCLE LENGTH: Hamstrings: mod/severe tight B ITB: mild tight/WFL Piriformis: mod tight B Hip flexors: mod tight B Quads: mild/mod tight B Heelcord: NT  LOWER EXTREMITY ROM:    WFL  other than limitations related to muscle tightness as indicated above  LOWER EXTREMITY MMT:    MMT Right eval  Left eval  Hip flexion 4 4  Hip extension 3+ 3+  Hip abduction 4- 4-  Hip adduction 4 4  Hip internal rotation 4 4  Hip external rotation 4- 4-  Knee flexion 4+ 4+  Knee extension 4+ 4+  Ankle dorsiflexion 4- 4-  Ankle plantarflexion 4 (14 SLS HR) 4 (15 SLS HR)  Ankle inversion    Ankle eversion    (Blank rows = not tested)  BED MOBILITY:  Independent  TRANSFERS: Assistive device utilized: None  Sit to stand: Complete Independence Stand to sit: Complete Independence Chair to chair: Modified independence Floor: NT  GAIT: Distance walked: clinic distances Assistive device utilized: None Level of assistance: Complete Independence Gait pattern: trunk flexed Comments: fatigue with increased SOB/DOE noted during ambulation for FGA  STAIRS:  Level of Assistance: Modified independence  Stair Negotiation Technique: Alternating Pattern  with Single Rail on Right  Number of Stairs: 14   Height of Stairs: 7"  Comments: increased SOB due to fatigue after attempt  FUNCTIONAL TESTS:  5 times sit to stand: 18.47 sec Timed up and go (TUG): 13.34 sec 10 meter walk test: 15.16 sec; Gait speed = 2.16 ft/sec  Functional gait assessment: 19/30; 19-24 = medium risk fall  PATIENT SURVEYS:  LEFS 40 / 80 = 50.0 %; 41-60: Mild to moderate functional limitation   TODAY'S TREATMENT:   03/30/24 THERAPEUTIC EXERCISE: To improve strength, endurance, ROM, and flexibility.  Demonstration, verbal and tactile cues throughout for technique.  Rec Bike - L2 x 6 min Standing hip hinge x 10 Lumbar/hip extension in standing with glutes against counter and hands on hips to promote chest opening 2 x 10  NEUROMUSCULAR RE-EDUCATION: To improve balance, coordination, kinesthesia, posture, proprioception, and reduce fall risk.  Functional squat + B OH reach x 10 Functional squat + B OH reach  holding 3# db bil x 10 SLS + RTB 4-way SLR x 10 bil, UE support on back of chair for balance Standing B RTB pallof press x 10   03/27/24 THERAPEUTIC EXERCISE: To improve strength, endurance, and flexibility.  Demonstration, verbal and tactile cues throughout for technique.  NuStep - L5 x 6 min  Mod thomas quad/hip flexor stretch 3 x 30" bil  THERAPEUTIC ACTIVITIES: To improve functional performance.  Demonstration, verbal and tactile cues throughout for technique. Tall kneel hip hinge (knees on Airex pad on yoga mat with pillow behind knee between glutes and feet) x 10 Quadruped press-up to tall knee x 10 Quadruped alt step to side x 10 Quadruped alt step to side + press-up to 1/2 kneel x 10  NEUROMUSCULAR RE-EDUCATION: To improve balance, coordination, kinesthesia, and posture. Supported squat with glute touch to mat table x 10 Lunge x 10 bil, single UE support on back of chair - cues for upright posture, limited by quad/hip flexor tightness   03/25/24 Nustep L5x77min Therapeutic Activity: to improve functional performance. 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 Standing rows and shoulder extension RTB x 15 Standing hip abduction to extension 3lb x 10 BLE Standing straight knee hip flexion  3lb x 10BLE   PATIENT EDUCATION:  Education details: continue with current HEP  Person educated: Patient Education method: Explanation Education comprehension: verbalized understanding  HOME EXERCISE PROGRAM: *Pt using MedBridgeGO app.  Access Code: [email or web address] URL: [email or web address] Date:  03/25/2024 Prepared by: Braylin Clark  Exercises - Standing March with Counter Support  - 1 x daily - 7 x weekly - 2 sets - 10 reps - 3 hold - Standing Hip Abduction with Unilateral  Counter Support  - 1 x daily - 7 x weekly - 2 sets - 10 reps - 3 hold - Standing Hip Extension with Unilateral Counter Support  - 1 x daily - 7 x weekly - 2 sets - 10 reps - 3 hold - Heel Toe Raises with Unilateral Counter Support  - 1 x daily - 7 x weekly - 2 sets - 10 reps - 3 hold - Standing Shoulder Row with Anchored Resistance  - 1 x daily - 7 x weekly - 2 sets - 10 reps - Shoulder Extension with Anchored Resistance  - 1 x daily - 7 x weekly - 2 sets - 10 reps   ASSESSMENT:  CLINICAL IMPRESSION: Primus reports he feels independent with the initial HEP using the MedBridgeGO app for guidance.  Continued strengthening emphasis on core and proximal LE with TB resistance and functional movement patterns while incorporating SLS for balance and proprioceptive training.  Barclay continues to require intermittent rest breaks due to DOE and fatigue.  He notes benefit from PT so far, noting improving posture with gait and increasing ease of mobility.  Srikar will benefit from continued skilled PT to address ongoing flexibility, strength and balance deficits to improve mobility and activity tolerance with decreased risk for falls.   EVAL: Peter Scott is a 89 y.o. male who was referred to physical therapy for evaluation and treatment for B LE weakness.  Patient presents with physical impairments of LE weakness, impaired activity tolerance, impaired standing balance, and impaired ambulation impacting safe and independent functional mobility.  Examination revealed patient is at risk for falls and functional decline as evidenced by the following objective test measures: Gait speed 2.16 ft/sec, (2.62 ft/sec is needed for community access), TUG of 13.34 sec (>13.5 sec indicates increased risk for falls), 5xSTS of 18.47 sec (>15 sec indicates increased risk for falls and decreased BLE power) and FGA of 19/30 (19-24 = medium risk fall).   Alian will benefit from skilled PT to address above deficits to improve mobility and  activity tolerance to help reach the maximal level of functional independence and mobility. Patient demonstrates understanding of this POC and is in agreement with this plan.   OBJECTIVE IMPAIRMENTS: Abnormal gait, decreased activity tolerance, decreased balance, decreased endurance, decreased knowledge of condition, decreased mobility, difficulty walking, decreased ROM, decreased strength, increased fascial restrictions, impaired perceived functional ability, impaired flexibility, improper body mechanics, and postural dysfunction.   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   REHAB POTENTIAL: Good  CLINICAL DECISION MAKING: Evolving/moderate complexity  EVALUATION COMPLEXITY: Moderate   GOALS: Goals reviewed with patient? Yes  SHORT TERM GOALS: Target date: 04/29/2024  Patient will be independent with initial HEP to improve outcomes and carryover.  Baseline:  Goal status: MET - 03/30/24  2.  Patient will be educated on strategies to decrease risk of falls.  Baseline:  Goal status: MET - 03/25/24  3.  Patient will demonstrate decreased 5xSTS time to </= 15 sec to decrease risk for falls with transitional mobility. Baseline: 18.47 sec Goal status: IN PROGRESS  LONG TERM GOALS: Target date: 06/10/2024  Patient will be independent with advanced/ongoing HEP to facilitate ability to maintain/progress functional gains from skilled physical therapy services. Baseline:  Goal status: IN PROGRESS  2.  Patient will be able to  ambulate 600' w/o AD on variable surfaces with good safety and no DOE to access community.  Baseline: DOE with ambulation w/in clinic Goal status: IN PROGRESS  3.  Patient will be able to independently transition from floor/ground to stand to allow for increased safety when working in his garden.  Baseline: difficulty with getting up from the ground Goal status: IN PROGRESS   4.  Patient will demonstrate improved B LE strength to >/= 4+/5 for improved stability and ease of mobility . Baseline: Refer to above LE MMT table Goal status: IN PROGRESS  5.  Patient will demonstrate gait speed of >/= 2.62 ft/sec (0.8 m/s) to be a safe community ambulator with decreased risk for recurrent falls.  Baseline: 2.16 ft/sec Goal status: IN PROGRESS  6.  Patient will improve FGA score to >/= 23/30 to improve gait stability and reduce risk for falls. Baseline: 19/30 Goal status: IN PROGRESS  7.  Patient will report >/= 49/80 on LEFS to demonstrate improved functional activity. Baseline: 40 / 80 = 50.0 % Goal status: IN PROGRESS   PLAN:  PT FREQUENCY: 1-2x/week - 2x/wk initially, potentially tapering to 1x/wk  PT DURATION: 8-12 weeks  PLANNED INTERVENTIONS: [id number]- PT Re-evaluation, 97750- Physical Performance Testing, 97110-Therapeutic exercises, 97530- Therapeutic activity, 97112- Neuromuscular re-education, 97535- Self Care, [id number]- Manual therapy, [phone]- Gait training, [phone]- Aquatic Therapy, Patient/Family education, Balance training, Stair training, Taping, Dry Needling, Joint mobilization, and Spinal mobilization  PLAN FOR NEXT SESSION: postural exercises and continue with core/LE strengthening and endurance   Francisco Irving, PT 03/30/2024, 9:45 AM

## 2024-04-03 ENCOUNTER — Ambulatory Visit

## 2024-04-03 DIAGNOSIS — R262 Difficulty in walking, not elsewhere classified: Secondary | ICD-10-CM

## 2024-04-03 DIAGNOSIS — M6281 Muscle weakness (generalized): Secondary | ICD-10-CM | POA: Diagnosis not present

## 2024-04-03 DIAGNOSIS — R2681 Unsteadiness on feet: Secondary | ICD-10-CM

## 2024-04-03 NOTE — Therapy (Signed)
 OUTPATIENT PHYSICAL THERAPY TREATMENT   Patient Name: Peter Scott MRN: [id number] DOB:1932/04/20, 89 y.o., male Today's Date: 04/03/2024   END OF SESSION:  PT End of Session - 04/03/24 0932     Visit Number 6    Date for PT Re-Evaluation 06/10/24    Authorization Type Medicare & AARP Supplement    PT Start Time 0845    PT Stop Time 0932    PT Time Calculation (min) 47 min    Activity Tolerance Patient tolerated treatment well    Behavior During Therapy WFL for tasks assessed/performed                Past Medical History:  Diagnosis Date   Aortic aneurysm (HCC)    Aortic stenosis    CAD (coronary artery disease)    Diabetes mellitus type II, controlled (HCC)    Heart disease    Ablation   History of chicken pox    Hyperlipidemia    Hypertension    S/P CABG x 4    2001 - [place]    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   26 mm Edwards Sapien 3 transcatheter heart valve placed via percutaneous right transfemoral approach    Umbilical hernia    Past Surgical History:  Procedure Laterality Date   ABLATION     Rhythm problem; rhythm unknown; [place]   CARDIOVERSION N/A 07/14/2018   Procedure: CARDIOVERSION;  Surgeon: Liza Riggers, MD;  Location: [facility] ENDOSCOPY;  Service: Cardiovascular;  Laterality: N/A;   CARDIOVERSION N/A 08/27/2018   Procedure: CARDIOVERSION;  Surgeon: Hugh Madura, MD;  Location: [facility] ENDOSCOPY;  Service: Cardiovascular;  Laterality: N/A;   CORONARY ARTERY BYPASS GRAFT  2001   EXPLORATION POST OPERATIVE OPEN HEART  2001, June   INTRAOPERATIVE TRANSTHORACIC ECHOCARDIOGRAM N/A 04/01/2018   Procedure: INTRAOPERATIVE TRANSTHORACIC ECHOCARDIOGRAM;  Surgeon: Odie Benne, MD;  Location: [facility] OR;  Service: Open Heart Surgery;  Laterality: N/A;   RIGHT/LEFT HEART CATH AND CORONARY/GRAFT ANGIOGRAPHY N/A 01/31/2018   Procedure: RIGHT/LEFT HEART CATH AND CORONARY/GRAFT ANGIOGRAPHY;  Surgeon: Odie Benne, MD;  Location: [facility] INVASIVE CV LAB;  Service: Cardiovascular;  Laterality: N/A;   TEE WITHOUT CARDIOVERSION N/A 01/17/2018   Procedure: TRANSESOPHAGEAL ECHOCARDIOGRAM (TEE);  Surgeon: Lenise Quince, MD;  Location: [facility] ENDOSCOPY;  Service: Cardiovascular;  Laterality: N/A;   TEE WITHOUT CARDIOVERSION N/A 07/14/2018   Procedure: TRANSESOPHAGEAL ECHOCARDIOGRAM (TEE);  Surgeon: Liza Riggers, MD;  Location: [facility] ENDOSCOPY;  Service: Cardiovascular;  Laterality: N/A;   TONSILLECTOMY     TRANSCATHETER AORTIC VALVE REPLACEMENT, TRANSFEMORAL N/A 04/01/2018   Procedure: TRANSCATHETER AORTIC VALVE REPLACEMENT, TRANSFEMORAL;  Surgeon: Odie Benne, MD;  Location: [facility] OR;  Service: Open Heart Surgery;  Laterality: N/A;   UMBILICAL HERNIA REPAIR  2015   Patient Active Problem List   Diagnosis Date Noted   Paroxysmal atrial fibrillation (HCC)    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   S/P CABG x 4    Severe aortic stenosis    Medicare annual wellness visit, subsequent 10/04/2014   Bruit 09/29/2014   Abdominal aortic aneurysm (HCC) 07/23/2014   BCC (basal cell carcinoma) 07/23/2014   SCC (squamous cell carcinoma), arm 07/23/2014   Essential hypertension, benign 07/08/2014   Coronary artery disease 07/08/2014   Hyperlipidemia LDL goal <100 07/08/2014   Type II diabetes mellitus, well controlled (HCC) 07/08/2014   Colon cancer screening 07/08/2014    PCP: Kaylee Partridge, MD   REFERRING PROVIDER: Gates Kasal  C, MD   REFERRING DIAG: R29.898 (ICD-10-CM) - Leg weakness, bilateral   THERAPY DIAG:  Muscle weakness (generalized)  Difficulty in walking, not elsewhere classified  Unsteadiness on feet  RATIONALE FOR EVALUATION AND TREATMENT: Rehabilitation  ONSET DATE:  worsening over the past 3-4 yrs  NEXT MD VISIT:  None scheduled    SUBJECTIVE:                                                                                                                                                                                                          SUBJECTIVE STATEMENT: Peter Scott reports he feels much better he wants to try to finish the appointments he has for the rest of this month, then wrap up PT.  From Eval:  Pt reports good upper body strength but notes weakness and fatigue with lower body activities.  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  He stays very active but feels limited with standing activities or activities where he has to lift things.  He denies pain limitations or limitations due to balance.  PAIN: Are you having pain? No  PERTINENT HISTORY:  Aortic aneurysm, aortic stenosis, CAD s/p CABG x 4 (2001), DM-II, s/p TAVR (2019), paroxysmal afib s/p ablation, HTN, s/p umbilical hernia repair  PRECAUTIONS: Fall  RED FLAGS: None  WEIGHT BEARING RESTRICTIONS: No  FALLS:  Has patient fallen in last 6 months? 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 Be able to stand and squat."   OBJECTIVE: (objective measures completed at initial evaluation unless otherwise dated)  DIAGNOSTIC FINDINGS:  N/A  COGNITION: Overall cognitive status: Within functional limits for tasks assessed   SENSATION: WFL  POSTURE:  rounded shoulders, forward head, and flexed trunk   MUSCLE LENGTH: Hamstrings: mod/severe tight B ITB: mild tight/WFL Piriformis: mod tight B Hip flexors: mod tight  B Quads: mild/mod tight B Heelcord: NT  LOWER EXTREMITY ROM:    WFL other than limitations related to muscle tightness as  indicated above  LOWER EXTREMITY MMT:    MMT Right eval Left eval  Hip flexion 4 4  Hip extension 3+ 3+  Hip abduction 4- 4-  Hip adduction 4 4  Hip internal rotation 4 4  Hip external rotation 4- 4-  Knee flexion 4+ 4+  Knee extension 4+ 4+  Ankle dorsiflexion 4- 4-  Ankle plantarflexion 4 (14 SLS HR) 4 (15 SLS HR)  Ankle inversion    Ankle eversion    (Blank rows = not tested)  BED MOBILITY:  Independent  TRANSFERS: Assistive device utilized: None  Sit to stand: Complete Independence Stand to sit: Complete Independence Chair to chair: Modified independence Floor: NT  GAIT: Distance walked: clinic distances Assistive device utilized: None Level of assistance: Complete Independence Gait pattern: trunk flexed Comments: fatigue with increased SOB/DOE noted during ambulation for FGA  STAIRS:  Level of Assistance: Modified independence  Stair Negotiation Technique: Alternating Pattern  with Single Rail on Right  Number of Stairs: 14   Height of Stairs: 7"  Comments: increased SOB due to fatigue after attempt  FUNCTIONAL TESTS:  5 times sit to stand: 18.47 sec Timed up and go (TUG): 13.34 sec 10 meter walk test: 15.16 sec; Gait speed = 2.16 ft/sec  Functional gait assessment: 19/30; 19-24 = medium risk fall  PATIENT SURVEYS:  LEFS 40 / 80 = 50.0 %; 41-60: Mild to moderate functional limitation   TODAY'S TREATMENT:  04/03/24 THERAPEUTIC EXERCISE: To improve strength, endurance, ROM, and flexibility.  Demonstration, verbal and tactile cues throughout for technique.  Nustep L5x6min UE/LE THERAPEUTIC ACTIVITIES: To improve functional performance.  Demonstration, verbal and tactile cues throughout for technique. STS + OHP x 10 with 10lb weight Raking GTB each side x 10  Step ups 6' x 10; 8' x 10 BLE SLS + RTB 4-way SLR x 10 bil, UE support on  back of chair for balance  03/30/24 THERAPEUTIC EXERCISE: To improve strength, endurance, ROM, and flexibility.  Demonstration, verbal and tactile cues throughout for technique.  Rec Bike - L2 x 6 min Standing hip hinge x 10 Lumbar/hip extension in standing with glutes against counter and hands on hips to promote chest opening 2 x 10  NEUROMUSCULAR RE-EDUCATION: To improve balance, coordination, kinesthesia, posture, proprioception, and reduce fall risk.  Functional squat + B OH reach x 10 Functional squat + B OH reach holding 3# db bil x 10 SLS + RTB 4-way SLR x 10 bil, UE support on back of chair for balance Standing B RTB pallof press x 10   03/27/24 THERAPEUTIC EXERCISE: To improve strength, endurance, and flexibility.  Demonstration, verbal and tactile cues throughout for technique.  NuStep - L5 x 6 min  Mod thomas quad/hip flexor stretch 3 x 30" bil  THERAPEUTIC ACTIVITIES: To improve functional performance.  Demonstration, verbal and tactile cues throughout for technique. Tall kneel hip hinge (knees on Airex pad on yoga mat with pillow behind knee between glutes and feet) x 10 Quadruped press-up to tall knee x 10 Quadruped alt step to side x 10 Quadruped alt step to side + press-up to 1/2 kneel x 10  NEUROMUSCULAR RE-EDUCATION: To improve balance, coordination, kinesthesia, and posture. Supported squat with glute touch to mat table x 10 Lunge x 10 bil, single UE support on back of chair - cues for upright posture, limited by quad/hip flexor tightness   03/25/24 Nustep L5x58min Therapeutic Activity: to improve functional performance. 15lb unilateral farmer carry 180 ft each hand Squat with 10lb kettle  bell x 10 STS + OHP x 10 with 1lb weight- fatigued Review of fall prevention and safety  NEUROMUSCULAR RE-EDUCATION: To improve proprioception, balance, and kinesthesia. Standing rows and shoulder extension RTB x 15 Standing hip abduction to extension 3lb x 10 BLE Standing  straight knee hip flexion  3lb x 10BLE   PATIENT EDUCATION:  Education details: continue with current HEP  Person educated: Patient Education method: Explanation Education comprehension: verbalized understanding  HOME EXERCISE PROGRAM: *Pt using MedBridgeGO app.  Access Code: [email or web address] URL: [email or web address] Date: 04/03/2024 Prepared by: Lossie Kalp  Exercises - Standing March with Counter Support  - 1 x daily - 7 x weekly - 2 sets - 10 reps - 3 hold - Standing Hip Abduction with Unilateral Counter Support  - 1 x daily - 7 x weekly - 2 sets - 10 reps - 3 hold - Standing Hip Extension with Unilateral Counter Support  - 1 x daily - 7 x weekly - 2 sets - 10 reps - 3 hold - Heel Toe Raises with Unilateral Counter Support  - 1 x daily - 7 x weekly - 2 sets - 10 reps - 3 hold - Standing Shoulder Row with Anchored Resistance  - 1 x daily - 7 x weekly - 2 sets - 10 reps - Shoulder Extension with Anchored Resistance  - 1 x daily - 7 x weekly - 2 sets - 10 reps - Hip Flexor Stretch with Strap on Table  - 1 x daily - 7 x weekly - 3 reps - 30 sec hold - Sit to Stand  - 1 x daily - 7 x weekly - 2 sets - 10 reps - Forward Step Up with Counter Support  - 1 x daily - 7 x weekly - 2 sets - 10 reps   ASSESSMENT:  CLINICAL IMPRESSION: Pt tolerated the progression of exercises well. We really focused on functional strengthening for his LE's and to improve his ability to rake leaves. He required postural cues to avoid bending fwd with hip extension. Peter Scott continues to require intermittent rest breaks due to DOE and fatigue. He notes benefit from PT so far, noting improving posture with gait and increasing ease of mobility, he actually wants to finish the remainder of the month with PT then wrap up afterward.  Peter Scott will benefit from continued skilled PT to address ongoing flexibility, strength and balance deficits to improve mobility and activity tolerance with decreased risk for falls.    EVAL: Peter Scott is a 89 y.o. male who was referred to physical therapy for evaluation and treatment for B LE weakness.  Patient presents with physical impairments of LE weakness, impaired activity tolerance, impaired standing balance, and impaired ambulation impacting safe and independent functional mobility.  Examination revealed patient is at risk for falls and functional decline as evidenced by the following objective test measures: Gait speed 2.16 ft/sec, (2.62 ft/sec is needed for community access), TUG of 13.34 sec (>13.5 sec indicates increased risk for falls), 5xSTS of 18.47 sec (>15 sec indicates increased risk for falls and decreased BLE power) and FGA of 19/30 (19-24 = medium risk fall).   Peter Scott will benefit from skilled PT to address above deficits to improve mobility and activity tolerance to help reach the maximal level of functional independence and mobility. Patient demonstrates understanding of this POC and is in agreement with this plan.   OBJECTIVE IMPAIRMENTS: Abnormal gait, decreased activity tolerance, decreased balance, decreased endurance, decreased knowledge of condition, decreased mobility, difficulty walking,  decreased ROM, decreased strength, increased fascial restrictions, impaired perceived functional ability, impaired flexibility, improper body mechanics, and postural dysfunction.   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   REHAB POTENTIAL: Good  CLINICAL DECISION MAKING: Evolving/moderate  complexity  EVALUATION COMPLEXITY: Moderate   GOALS: Goals reviewed with patient? Yes  SHORT TERM GOALS: Target date: 04/29/2024  Patient will be independent with initial HEP to improve outcomes and carryover.  Baseline:  Goal status: MET - 03/30/24  2.  Patient will be educated on strategies to decrease risk of falls.  Baseline:  Goal status: MET - 03/25/24  3.  Patient will demonstrate decreased 5xSTS time to </= 15 sec to decrease risk for falls with transitional mobility. Baseline: 18.47 sec Goal status: IN PROGRESS  LONG TERM GOALS: Target date: 06/10/2024  Patient will be independent with advanced/ongoing HEP to facilitate ability to maintain/progress functional gains from skilled physical therapy services. Baseline:  Goal status: IN PROGRESS  2.  Patient will be able to ambulate 600' w/o AD on variable surfaces with good safety and no DOE to access community.  Baseline: DOE with ambulation w/in clinic Goal status: IN PROGRESS  3.  Patient will be able to independently transition from floor/ground to stand to allow for increased safety when working in his garden.  Baseline: difficulty with getting up from the ground Goal status: IN PROGRESS   4.  Patient will demonstrate improved B LE strength to >/= 4+/5 for improved stability and ease of mobility . Baseline: Refer to above LE MMT table Goal status: IN PROGRESS  5.  Patient will demonstrate gait speed of >/= 2.62 ft/sec (0.8 m/s) to be a safe community ambulator with decreased risk for recurrent falls.  Baseline: 2.16 ft/sec Goal status: IN PROGRESS  6.  Patient will improve FGA score to >/= 23/30 to improve gait stability and reduce risk for falls. Baseline: 19/30 Goal status: IN PROGRESS  7.  Patient will report >/= 49/80 on LEFS to demonstrate improved functional activity. Baseline: 40 / 80 = 50.0 % Goal status: IN PROGRESS   PLAN:  PT FREQUENCY: 1-2x/week - 2x/wk initially, potentially tapering to  1x/wk  PT DURATION: 8-12 weeks  PLANNED INTERVENTIONS: [id number]- PT Re-evaluation, 97750- Physical Performance Testing, 97110-Therapeutic exercises, 97530- Therapeutic activity, 97112- Neuromuscular re-education, 97535- Self Care, [id number]- Manual therapy, [phone]- Gait training, [phone]- Aquatic Therapy, Patient/Family education, Balance training, Stair training, Taping, Dry Needling, Joint mobilization, and Spinal mobilization  PLAN FOR NEXT SESSION: postural exercises and continue with core/LE strengthening and endurance   Ailea Rhatigan L Kemiah Booz, PTA 04/03/2024, 9:32 AM

## 2024-04-06 ENCOUNTER — Ambulatory Visit: Admitting: Physical Therapy

## 2024-04-06 ENCOUNTER — Encounter: Payer: Self-pay | Admitting: Physical Therapy

## 2024-04-06 DIAGNOSIS — R2681 Unsteadiness on feet: Secondary | ICD-10-CM | POA: Diagnosis not present

## 2024-04-06 DIAGNOSIS — R262 Difficulty in walking, not elsewhere classified: Secondary | ICD-10-CM

## 2024-04-06 DIAGNOSIS — M6281 Muscle weakness (generalized): Secondary | ICD-10-CM

## 2024-04-06 NOTE — Therapy (Signed)
 OUTPATIENT PHYSICAL THERAPY TREATMENT   Patient Name: Peter Scott MRN: [id number] DOB:Apr 15, 1932, 89 y.o., male Today's Date: 04/06/2024   END OF SESSION:  PT End of Session - 04/06/24 0802     Visit Number 7    Date for PT Re-Evaluation 06/10/24    Authorization Type Medicare & AARP Supplement    PT Start Time 0802    PT Stop Time 0846    PT Time Calculation (min) 44 min    Activity Tolerance Patient tolerated treatment well    Behavior During Therapy WFL for tasks assessed/performed                Past Medical History:  Diagnosis Date   Aortic aneurysm (HCC)    Aortic stenosis    CAD (coronary artery disease)    Diabetes mellitus type II, controlled (HCC)    Heart disease    Ablation   History of chicken pox    Hyperlipidemia    Hypertension    S/P CABG x 4    2001 - [place]    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   26 mm Edwards Sapien 3 transcatheter heart valve placed via percutaneous right transfemoral approach    Umbilical hernia    Past Surgical History:  Procedure Laterality Date   ABLATION     Rhythm problem; rhythm unknown; [place]   CARDIOVERSION N/A 07/14/2018   Procedure: CARDIOVERSION;  Surgeon: Liza Riggers, MD;  Location: [facility] ENDOSCOPY;  Service: Cardiovascular;  Laterality: N/A;   CARDIOVERSION N/A 08/27/2018   Procedure: CARDIOVERSION;  Surgeon: Hugh Madura, MD;  Location: [facility] ENDOSCOPY;  Service: Cardiovascular;  Laterality: N/A;   CORONARY ARTERY BYPASS GRAFT  2001   EXPLORATION POST OPERATIVE OPEN HEART  2001, June   INTRAOPERATIVE TRANSTHORACIC ECHOCARDIOGRAM N/A 04/01/2018   Procedure: INTRAOPERATIVE TRANSTHORACIC ECHOCARDIOGRAM;  Surgeon: Odie Benne, MD;  Location: [facility] OR;  Service: Open Heart Surgery;  Laterality: N/A;   RIGHT/LEFT HEART CATH AND CORONARY/GRAFT ANGIOGRAPHY N/A 01/31/2018   Procedure: RIGHT/LEFT HEART CATH AND CORONARY/GRAFT ANGIOGRAPHY;  Surgeon: Odie Benne, MD;  Location: [facility] INVASIVE CV LAB;  Service: Cardiovascular;  Laterality: N/A;   TEE WITHOUT CARDIOVERSION N/A 01/17/2018   Procedure: TRANSESOPHAGEAL ECHOCARDIOGRAM (TEE);  Surgeon: Lenise Quince, MD;  Location: [facility] ENDOSCOPY;  Service: Cardiovascular;  Laterality: N/A;   TEE WITHOUT CARDIOVERSION N/A 07/14/2018   Procedure: TRANSESOPHAGEAL ECHOCARDIOGRAM (TEE);  Surgeon: Liza Riggers, MD;  Location: [facility] ENDOSCOPY;  Service: Cardiovascular;  Laterality: N/A;   TONSILLECTOMY     TRANSCATHETER AORTIC VALVE REPLACEMENT, TRANSFEMORAL N/A 04/01/2018   Procedure: TRANSCATHETER AORTIC VALVE REPLACEMENT, TRANSFEMORAL;  Surgeon: Odie Benne, MD;  Location: [facility] OR;  Service: Open Heart Surgery;  Laterality: N/A;   UMBILICAL HERNIA REPAIR  2015   Patient Active Problem List   Diagnosis Date Noted   Paroxysmal atrial fibrillation (HCC)    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   S/P CABG x 4    Severe aortic stenosis    Medicare annual wellness visit, subsequent 10/04/2014   Bruit 09/29/2014   Abdominal aortic aneurysm (HCC) 07/23/2014   BCC (basal cell carcinoma) 07/23/2014   SCC (squamous cell carcinoma), arm 07/23/2014   Essential hypertension, benign 07/08/2014   Coronary artery disease 07/08/2014   Hyperlipidemia LDL goal <100 07/08/2014   Type II diabetes mellitus, well controlled (HCC) 07/08/2014   Colon cancer screening 07/08/2014    PCP: Kaylee Partridge, MD   REFERRING PROVIDER: Gates Kasal  C, MD   REFERRING DIAG: R29.898 (ICD-10-CM) - Leg weakness, bilateral   THERAPY DIAG:  Muscle weakness (generalized)  Difficulty in walking, not elsewhere classified  Unsteadiness on feet  RATIONALE FOR EVALUATION AND TREATMENT: Rehabilitation  ONSET DATE:  worsening over the past 3-4 yrs  NEXT MD VISIT:  None scheduled    SUBJECTIVE:                                                                                                                                                                                                          SUBJECTIVE STATEMENT: Peter Scott reports he had a bad day yesterday morning - legs felt like rubber noodles and didn't feel like going to church, but feels better today.  From Eval:  Pt reports good upper body strength but notes weakness and fatigue with lower body activities.  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  He stays very active but feels limited with standing activities or activities where he has to lift things.  He denies pain limitations or limitations due to balance.  PAIN: Are you having pain? No  PERTINENT HISTORY:  Aortic aneurysm, aortic stenosis, CAD s/p CABG x 4 (2001), DM-II, s/p TAVR (2019), paroxysmal afib s/p ablation, HTN, s/p umbilical hernia repair  PRECAUTIONS: Fall  RED FLAGS: None  WEIGHT BEARING RESTRICTIONS: No  FALLS:  Has patient fallen in last 6 months? 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 Be able to stand and squat."   OBJECTIVE: (objective measures completed at initial evaluation unless otherwise dated)  DIAGNOSTIC FINDINGS:  N/A  COGNITION: Overall cognitive status: Within functional limits for tasks assessed   SENSATION: WFL  POSTURE:  rounded shoulders, forward head, and flexed trunk   MUSCLE LENGTH: Hamstrings: mod/severe tight B ITB: mild tight/WFL Piriformis: mod tight B Hip  flexors: mod tight B Quads: mild/mod tight B Heelcord: NT  LOWER EXTREMITY ROM:    WFL other than limitations related to muscle tightness as  indicated above  LOWER EXTREMITY MMT:    MMT Right eval Left eval  Hip flexion 4 4  Hip extension 3+ 3+  Hip abduction 4- 4-  Hip adduction 4 4  Hip internal rotation 4 4  Hip external rotation 4- 4-  Knee flexion 4+ 4+  Knee extension 4+ 4+  Ankle dorsiflexion 4- 4-  Ankle plantarflexion 4 (14 SLS HR) 4 (15 SLS HR)  Ankle inversion    Ankle eversion    (Blank rows = not tested)  BED MOBILITY:  Independent  TRANSFERS: Assistive device utilized: None  Sit to stand: Complete Independence Stand to sit: Complete Independence Chair to chair: Modified independence Floor: NT  GAIT: Distance walked: clinic distances Assistive device utilized: None Level of assistance: Complete Independence Gait pattern: trunk flexed Comments: fatigue with increased SOB/DOE noted during ambulation for FGA  STAIRS:  Level of Assistance: Modified independence  Stair Negotiation Technique: Alternating Pattern  with Single Rail on Right  Number of Stairs: 14   Height of Stairs: 7"  Comments: increased SOB due to fatigue after attempt  FUNCTIONAL TESTS:  5 times sit to stand: 18.47 sec Timed up and go (TUG): 13.34 sec 10 meter walk test: 15.16 sec; Gait speed = 2.16 ft/sec  Functional gait assessment: 19/30; 19-24 = medium risk fall  04/06/24: 5xSTS = 12.19 sec  PATIENT SURVEYS:  LEFS 40 / 80 = 50.0 %; 41-60: Mild to moderate functional limitation   TODAY'S TREATMENT:   04/06/24 THERAPEUTIC EXERCISE: To improve strength, endurance, and flexibility.  Demonstration, verbal and tactile cues throughout for technique.  NuStep - L6 x 6 min Seated lunge position hip flexor stretch over edge of chair 2x 30" Seated RTB alternating hip ABD/ER clam x 10  THERAPEUTIC ACTIVITIES: To improve functional performance.  Demonstration, verbal and tactile cues  throughout for technique. 5xSTS = 12.19 sec  NEUROMUSCULAR RE-EDUCATION: To improve balance, coordination, kinesthesia, posture, proprioception, and reduce fall risk.  SLS + RTB 4-way SLR x 10 bil, UE support on back of chair for balance Standing RTB band march with band looped at mid feet x10   04/03/24 THERAPEUTIC EXERCISE: To improve strength, endurance, ROM, and flexibility.  Demonstration, verbal and tactile cues throughout for technique.  Nustep L5x6min UE/LE THERAPEUTIC ACTIVITIES: To improve functional performance.  Demonstration, verbal and tactile cues throughout for technique. STS + OHP x 10 with 10lb weight Raking GTB each side x 10  Step ups 6' x 10; 8' x 10 BLE SLS + RTB 4-way SLR x 10 bil, UE support on back of chair for balance   03/30/24 THERAPEUTIC EXERCISE: To improve strength, endurance, ROM, and flexibility.  Demonstration, verbal and tactile cues throughout for technique.  Rec Bike - L2 x 6 min Standing hip hinge x 10 Lumbar/hip extension in standing with glutes against counter and hands on hips to promote chest opening 2 x 10  NEUROMUSCULAR RE-EDUCATION: To improve balance, coordination, kinesthesia, posture, proprioception, and reduce fall risk.  Functional squat + B OH reach x 10 Functional squat + B OH reach holding 3# db bil x 10 SLS + RTB 4-way SLR x 10 bil, UE support on back of chair for balance Standing B RTB pallof press x 10   03/27/24 THERAPEUTIC EXERCISE: To improve strength, endurance, and flexibility.  Demonstration, verbal and tactile cues throughout for technique.  NuStep - L5 x 6 min  Mod thomas quad/hip flexor stretch 3 x 30" bil  THERAPEUTIC ACTIVITIES: To improve functional performance.  Demonstration, verbal  and tactile cues throughout for technique. Tall kneel hip hinge (knees on Airex pad on yoga mat with pillow behind knee between glutes and feet) x 10 Quadruped press-up to tall knee x 10 Quadruped alt step to side x 10 Quadruped alt  step to side + press-up to 1/2 kneel x 10  NEUROMUSCULAR RE-EDUCATION: To improve balance, coordination, kinesthesia, and posture. Supported squat with glute touch to mat table x 10 Lunge x 10 bil, single UE support on back of chair - cues for upright posture, limited by quad/hip flexor tightness   03/25/24 Nustep L5x82min Therapeutic Activity: to improve functional performance. 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 Standing rows and shoulder extension RTB x 15 Standing hip abduction to extension 3lb x 10 BLE Standing straight knee hip flexion  3lb x 10BLE   PATIENT EDUCATION:  Education details: continue with current HEP  Person educated: Patient Education method: Explanation Education comprehension: verbalized understanding  HOME EXERCISE PROGRAM: *Pt using MedBridgeGO app.  Access Code: [email or web address] URL: [email or web address] Date: 04/06/2024 Prepared by: Felecia Hopper  Exercises - Seated Hip Flexor Stretch  - 1-2 x daily - 7 x weekly - 3 reps - 30 sec hold - Standing Shoulder Row with Anchored Resistance  - 1 x daily - 3 x weekly - 2 sets - 10 reps - Shoulder Extension with Anchored Resistance  - 1 x daily - 3 x weekly - 2 sets - 10 reps - Heel Toe Raises with Unilateral Counter Support  - 1 x daily - 7 x weekly - 2 sets - 10 reps - 3 hold - Sit to Stand  - 1 x daily - 7 x weekly - 2 sets - 10 reps - Forward Step Up with Counter Support  - 1 x daily - 7 x weekly - 2 sets - 10 reps - Seated Isometric Hip Abduction with Resistance  - 1 x daily - 3 x weekly - 2 sets - 10 reps - 3 sec hold - Marching with Resistance  - 1 x daily - 3 x weekly - 2 sets - 10 reps - 3 sec hold - Standing Hip Flexion with Anchored Resistance and Chair Support  - 1 x daily - 3 x weekly - 2  sets - 10 reps - 3 sec hold - Standing Hip Adduction with Anchored Resistance  - 1 x daily - 3 x weekly - 2 sets - 10 reps - 3 sec hold - Standing Hip Extension with Resistance  - 1 x daily - 3 x weekly - 2 sets - 10 reps - 3 sec hold - Standing Hip Abduction with Resistance  - 1 x daily - 3 x weekly - 2 sets - 10 reps - 3 sec hold   ASSESSMENT:  CLINICAL IMPRESSION: Peter Scott feels like the MedBridgeGO videos make it easy to do the HEP.  Today's session focusing on progression of HEP adding resistance for standing hip strengthening, as well as modifying hip flexor stretch to sitting as patient reports he is unable to find a surface to lie down on for mod Thomas stretch at home.  Functionally he is demonstrating progress with 5xSTS improved to 12.19 sec, indicating a reduction in recurrent fall risk and achieving STG #3.  He is still considering the idea of transitioning to his HEP at the  end of the month, however notes family is encouraging him to continue with PT longer.  Peter Scott will benefit from continued skilled PT to address ongoing flexibility, strength and balance deficits to improve mobility and activity tolerance with decreased risk for falls.   EVAL: Peter Scott is a 89 y.o. male who was referred to physical therapy for evaluation and treatment for B LE weakness.  Patient presents with physical impairments of LE weakness, impaired activity tolerance, impaired standing balance, and impaired ambulation impacting safe and independent functional mobility.  Examination revealed patient is at risk for falls and functional decline as evidenced by the following objective test measures: Gait speed 2.16 ft/sec, (2.62 ft/sec is needed for community access), TUG of 13.34 sec (>13.5 sec indicates increased risk for falls), 5xSTS of 18.47 sec (>15 sec indicates increased risk for falls and decreased BLE power) and FGA of 19/30 (19-24 = medium risk fall).   Peter Scott will benefit from skilled PT to address above  deficits to improve mobility and activity tolerance to help reach the maximal level of functional independence and mobility. Patient demonstrates understanding of this POC and is in agreement with this plan.   OBJECTIVE IMPAIRMENTS: Abnormal gait, decreased activity tolerance, decreased balance, decreased endurance, decreased knowledge of condition, decreased mobility, difficulty walking, decreased ROM, decreased strength, increased fascial restrictions, impaired perceived functional ability, impaired flexibility, improper body mechanics, and postural dysfunction.   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   REHAB POTENTIAL: Good  CLINICAL DECISION MAKING: Evolving/moderate complexity  EVALUATION COMPLEXITY: Moderate   GOALS: Goals reviewed with patient? Yes  SHORT TERM GOALS: Target date: 04/29/2024  Patient will be independent with initial HEP to improve outcomes and carryover.  Baseline:  Goal status: MET - 03/30/24  2.  Patient will be educated on strategies to decrease risk of falls.  Baseline:  Goal status: MET - 03/25/24  3.  Patient will demonstrate decreased 5xSTS time to </= 15 sec to decrease risk for falls with transitional mobility. Baseline: 18.47 sec Goal status: MET - 04/06/24 - 12.19 sec  LONG TERM GOALS: Target date: 06/10/2024  Patient will be independent with advanced/ongoing HEP to facilitate ability to maintain/progress functional gains from skilled physical therapy services. Baseline:  Goal status:  IN PROGRESS  2.  Patient will be able to ambulate 600' w/o AD on variable surfaces with good safety and no DOE to access community.  Baseline: DOE with ambulation w/in clinic Goal status: IN PROGRESS  3.  Patient will be able to independently transition from floor/ground to stand to allow for increased safety when working in his garden.  Baseline: difficulty with getting up from the ground Goal status: IN PROGRESS   4.  Patient will demonstrate improved B LE strength to >/= 4+/5 for improved stability and ease of mobility . Baseline: Refer to above LE MMT table Goal status: IN PROGRESS  5.  Patient will demonstrate gait speed of >/= 2.62 ft/sec (0.8 m/s) to be a safe community ambulator with decreased risk for recurrent falls.  Baseline: 2.16 ft/sec Goal status: IN PROGRESS  6.  Patient will improve FGA score to >/= 23/30 to  improve gait stability and reduce risk for falls. Baseline: 19/30 Goal status: IN PROGRESS  7.  Patient will report >/= 49/80 on LEFS to demonstrate improved functional activity. Baseline: 40 / 80 = 50.0 % Goal status: IN PROGRESS   PLAN:  PT FREQUENCY: 1-2x/week - 2x/wk initially, potentially tapering to 1x/wk  PT DURATION: 8-12 weeks  PLANNED INTERVENTIONS: [id number]- PT Re-evaluation, 97750- Physical Performance Testing, 97110-Therapeutic exercises, 97530- Therapeutic activity, 97112- Neuromuscular re-education, 97535- Self Care, [id number]- Manual therapy, [phone]- Gait training, [phone]- Aquatic Therapy, Patient/Family education, Balance training, Stair training, Taping, Dry Needling, Joint mobilization, and Spinal mobilization  PLAN FOR NEXT SESSION: postural exercises and continue with core/LE strengthening and endurance   Francisco Irving, PT 04/06/2024, 8:47 AM

## 2024-04-08 ENCOUNTER — Ambulatory Visit

## 2024-04-08 DIAGNOSIS — R262 Difficulty in walking, not elsewhere classified: Secondary | ICD-10-CM

## 2024-04-08 DIAGNOSIS — M6281 Muscle weakness (generalized): Secondary | ICD-10-CM | POA: Diagnosis not present

## 2024-04-08 DIAGNOSIS — R2681 Unsteadiness on feet: Secondary | ICD-10-CM

## 2024-04-08 NOTE — Therapy (Signed)
 OUTPATIENT PHYSICAL THERAPY TREATMENT   Patient Name: Peter Scott MRN: [id number] DOB:November 09, 1932, 89 y.o., male Today's Date: 04/08/2024   END OF SESSION:  PT End of Session - 04/08/24 0845     Visit Number 8    Date for PT Re-Evaluation 06/10/24    Authorization Type Medicare & AARP Supplement    PT Start Time 0800    PT Stop Time 0845    PT Time Calculation (min) 45 min    Activity Tolerance Patient tolerated treatment well    Behavior During Therapy WFL for tasks assessed/performed                 Past Medical History:  Diagnosis Date   Aortic aneurysm (HCC)    Aortic stenosis    CAD (coronary artery disease)    Diabetes mellitus type II, controlled (HCC)    Heart disease    Ablation   History of chicken pox    Hyperlipidemia    Hypertension    S/P CABG x 4    2001 - [place]    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   26 mm Edwards Sapien 3 transcatheter heart valve placed via percutaneous right transfemoral approach    Umbilical hernia    Past Surgical History:  Procedure Laterality Date   ABLATION     Rhythm problem; rhythm unknown; [place]   CARDIOVERSION N/A 07/14/2018   Procedure: CARDIOVERSION;  Surgeon: Liza Riggers, MD;  Location: [facility] ENDOSCOPY;  Service: Cardiovascular;  Laterality: N/A;   CARDIOVERSION N/A 08/27/2018   Procedure: CARDIOVERSION;  Surgeon: Hugh Madura, MD;  Location: [facility] ENDOSCOPY;  Service: Cardiovascular;  Laterality: N/A;   CORONARY ARTERY BYPASS GRAFT  2001   EXPLORATION POST OPERATIVE OPEN HEART  2001, June   INTRAOPERATIVE TRANSTHORACIC ECHOCARDIOGRAM N/A 04/01/2018   Procedure: INTRAOPERATIVE TRANSTHORACIC ECHOCARDIOGRAM;  Surgeon: Odie Benne, MD;  Location: [facility] OR;  Service: Open Heart Surgery;  Laterality: N/A;   RIGHT/LEFT HEART CATH AND CORONARY/GRAFT ANGIOGRAPHY N/A 01/31/2018   Procedure: RIGHT/LEFT HEART CATH AND CORONARY/GRAFT ANGIOGRAPHY;  Surgeon: Odie Benne, MD;  Location: [facility] INVASIVE CV LAB;  Service: Cardiovascular;  Laterality: N/A;   TEE WITHOUT CARDIOVERSION N/A 01/17/2018   Procedure: TRANSESOPHAGEAL ECHOCARDIOGRAM (TEE);  Surgeon: Lenise Quince, MD;  Location: [facility] ENDOSCOPY;  Service: Cardiovascular;  Laterality: N/A;   TEE WITHOUT CARDIOVERSION N/A 07/14/2018   Procedure: TRANSESOPHAGEAL ECHOCARDIOGRAM (TEE);  Surgeon: Liza Riggers, MD;  Location: [facility] ENDOSCOPY;  Service: Cardiovascular;  Laterality: N/A;   TONSILLECTOMY     TRANSCATHETER AORTIC VALVE REPLACEMENT, TRANSFEMORAL N/A 04/01/2018   Procedure: TRANSCATHETER AORTIC VALVE REPLACEMENT, TRANSFEMORAL;  Surgeon: Odie Benne, MD;  Location: [facility] OR;  Service: Open Heart Surgery;  Laterality: N/A;   UMBILICAL HERNIA REPAIR  2015   Patient Active Problem List   Diagnosis Date Noted   Paroxysmal atrial fibrillation (HCC)    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   S/P CABG x 4    Severe aortic stenosis    Medicare annual wellness visit, subsequent 10/04/2014   Bruit 09/29/2014   Abdominal aortic aneurysm (HCC) 07/23/2014   BCC (basal cell carcinoma) 07/23/2014   SCC (squamous cell carcinoma), arm 07/23/2014   Essential hypertension, benign 07/08/2014   Coronary artery disease 07/08/2014   Hyperlipidemia LDL goal <100 07/08/2014   Type II diabetes mellitus, well controlled (HCC) 07/08/2014   Colon cancer screening 07/08/2014    PCP: Copland, Peter Dumas, MD   REFERRING PROVIDER: Copland,  Peter Dumas, MD   REFERRING DIAG: R29.898 (ICD-10-CM) - Leg weakness, bilateral   THERAPY DIAG:  Muscle weakness (generalized)  Difficulty in walking, not elsewhere classified  Unsteadiness on feet  RATIONALE FOR EVALUATION AND TREATMENT: Rehabilitation  ONSET DATE:  worsening over the past 3-4 yrs  NEXT MD VISIT:  None scheduled    SUBJECTIVE:                                                                                                                                                                                                          SUBJECTIVE STATEMENT: 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  He stays very active but feels limited with standing activities or activities where he has to lift things.  He denies pain limitations or limitations due to balance.  PAIN: Are you having pain? No  PERTINENT HISTORY:  Aortic aneurysm, aortic stenosis, CAD s/p CABG x 4 (2001), DM-II, s/p TAVR (2019), paroxysmal afib s/p ablation, HTN, s/p umbilical hernia repair  PRECAUTIONS: Fall  RED FLAGS: None  WEIGHT BEARING RESTRICTIONS: No  FALLS:  Has patient fallen in last 6 months? 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 Be able to stand and squat."   OBJECTIVE: (objective measures completed at initial evaluation unless otherwise dated)  DIAGNOSTIC FINDINGS:  N/A  COGNITION: Overall cognitive status: Within functional limits for tasks assessed   SENSATION: WFL  POSTURE:  rounded shoulders, forward head, and flexed trunk   MUSCLE LENGTH: Hamstrings: mod/severe tight B ITB: mild tight/WFL Piriformis: mod tight B Hip flexors: mod tight B Quads: mild/mod tight B Heelcord:  NT  LOWER EXTREMITY ROM:    WFL other than limitations related to muscle tightness as indicated above  LOWER EXTREMITY MMT:    MMT  Right eval Left eval  Hip flexion 4 4  Hip extension 3+ 3+  Hip abduction 4- 4-  Hip adduction 4 4  Hip internal rotation 4 4  Hip external rotation 4- 4-  Knee flexion 4+ 4+  Knee extension 4+ 4+  Ankle dorsiflexion 4- 4-  Ankle plantarflexion 4 (14 SLS HR) 4 (15 SLS HR)  Ankle inversion    Ankle eversion    (Blank rows = not tested)  BED MOBILITY:  Independent  TRANSFERS: Assistive device utilized: None  Sit to stand: Complete Independence Stand to sit: Complete Independence Chair to chair: Modified independence Floor: NT  GAIT: Distance walked: clinic distances Assistive device utilized: None Level of assistance: Complete Independence Gait pattern: trunk flexed Comments: fatigue with increased SOB/DOE noted during ambulation for FGA  STAIRS:  Level of Assistance: Modified independence  Stair Negotiation Technique: Alternating Pattern  with Single Rail on Right  Number of Stairs: 14   Height of Stairs: 7"  Comments: increased SOB due to fatigue after attempt  FUNCTIONAL TESTS:  5 times sit to stand: 18.47 sec Timed up and go (TUG): 13.34 sec 10 meter walk test: 15.16 sec; Gait speed = 2.16 ft/sec  Functional gait assessment: 19/30; 19-24 = medium risk fall  04/06/24: 5xSTS = 12.19 sec  PATIENT SURVEYS:  LEFS 40 / 80 = 50.0 %; 41-60: Mild to moderate functional limitation   TODAY'S TREATMENT:  04/08/24 THERAPEUTIC EXERCISE: To improve strength, endurance, and flexibility.  Demonstration, verbal and tactile cues throughout for technique.  NuStep - L6 x 6 min Leg curls 20lb x 10 BLE Leg ext 15lb 2 x 10 BLE Standing quad stretch with strap 2x15" B Seated hamstring stretch + gastroc w/ strap  NEUROMUSCULAR RE-EDUCATION: To improve balance, coordination, kinesthesia, posture, proprioception, and reduce fall risk.  Standing  shoulder ext GTB 2x10 Standing rows GTB 2x10  Pallof press GTB standing x 20 B Squat with 10lb kettle bell 2 x 10 Standing bicep curl 10lb x 10  04/06/24 THERAPEUTIC EXERCISE: To improve strength, endurance, and flexibility.  Demonstration, verbal and tactile cues throughout for technique.  NuStep - L6 x 6 min Seated lunge position hip flexor stretch over edge of chair 2x 30" Seated RTB alternating hip ABD/ER clam x 10  THERAPEUTIC ACTIVITIES: To improve functional performance.  Demonstration, verbal and tactile cues throughout for technique. 5xSTS = 12.19 sec  NEUROMUSCULAR RE-EDUCATION: To improve balance, coordination, kinesthesia, posture, proprioception, and reduce fall risk.  SLS + RTB 4-way SLR x 10 bil, UE support on back of chair for balance Standing RTB band march with band looped at mid feet x10   04/03/24 THERAPEUTIC EXERCISE: To improve strength, endurance, ROM, and flexibility.  Demonstration, verbal and tactile cues throughout for technique.  Nustep L5x6min UE/LE THERAPEUTIC ACTIVITIES: To improve functional performance.  Demonstration, verbal and tactile cues throughout for technique. STS + OHP x 10 with 10lb weight Raking GTB each side x 10  Step ups 6' x 10; 8' x 10 BLE SLS + RTB 4-way SLR x 10 bil, UE support on back of chair for balance   03/30/24 THERAPEUTIC EXERCISE: To improve strength, endurance, ROM, and flexibility.  Demonstration, verbal and tactile cues throughout for technique.  Rec Bike - L2 x 6 min Standing hip hinge x 10 Lumbar/hip extension in standing with glutes against counter and hands on hips to promote chest opening 2 x 10  NEUROMUSCULAR RE-EDUCATION: To improve balance, coordination, kinesthesia, posture, proprioception, and reduce fall risk.  Functional squat +  B OH reach x 10 Functional squat + B OH reach holding 3# db bil x 10 SLS + RTB 4-way SLR x 10 bil, UE support on back of chair for balance Standing B RTB pallof press x  10   03/27/24 THERAPEUTIC EXERCISE: To improve strength, endurance, and flexibility.  Demonstration, verbal and tactile cues throughout for technique.  NuStep - L5 x 6 min  Mod thomas quad/hip flexor stretch 3 x 30" bil  THERAPEUTIC ACTIVITIES: To improve functional performance.  Demonstration, verbal and tactile cues throughout for technique. Tall kneel hip hinge (knees on Airex pad on yoga mat with pillow behind knee between glutes and feet) x 10 Quadruped press-up to tall knee x 10 Quadruped alt step to side x 10 Quadruped alt step to side + press-up to 1/2 kneel x 10  NEUROMUSCULAR RE-EDUCATION: To improve balance, coordination, kinesthesia, and posture. Supported squat with glute touch to mat table x 10 Lunge x 10 bil, single UE support on back of chair - cues for upright posture, limited by quad/hip flexor tightness   03/25/24 Nustep L5x44min Therapeutic Activity: to improve functional performance. 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 Standing rows and shoulder extension RTB x 15 Standing hip abduction to extension 3lb x 10 BLE Standing straight knee hip flexion  3lb x 10BLE   PATIENT EDUCATION:  Education details: continue with current HEP  Person educated: Patient Education method: Explanation Education comprehension: verbalized understanding  HOME EXERCISE PROGRAM: *Pt using MedBridgeGO app.  Access Code: [email or web address] URL: [email or web address] Date: 04/06/2024 Prepared by: Felecia Hopper  Exercises - Seated Hip Flexor Stretch  - 1-2 x daily - 7 x weekly - 3 reps - 30 sec hold - Standing Shoulder Row with Anchored Resistance  - 1 x daily - 3 x weekly - 2 sets - 10 reps - Shoulder Extension with Anchored Resistance  - 1 x daily - 3 x weekly - 2 sets - 10  reps - Heel Toe Raises with Unilateral Counter Support  - 1 x daily - 7 x weekly - 2 sets - 10 reps - 3 hold - Sit to Stand  - 1 x daily - 7 x weekly - 2 sets - 10 reps - Forward Step Up with Counter Support  - 1 x daily - 7 x weekly - 2 sets - 10 reps - Seated Isometric Hip Abduction with Resistance  - 1 x daily - 3 x weekly - 2 sets - 10 reps - 3 sec hold - Marching with Resistance  - 1 x daily - 3 x weekly - 2 sets - 10 reps - 3 sec hold - Standing Hip Flexion with Anchored Resistance and Chair Support  - 1 x daily - 3 x weekly - 2 sets - 10 reps - 3 sec hold - Standing Hip Adduction with Anchored Resistance  - 1 x daily - 3 x weekly - 2 sets - 10 reps - 3 sec hold - Standing Hip Extension with Resistance  - 1 x daily - 3 x weekly - 2 sets - 10 reps - 3 sec hold - Standing Hip Abduction with Resistance  - 1 x daily - 3 x weekly - 2 sets - 10 reps - 3 sec hold   ASSESSMENT:  CLINICAL IMPRESSION: Pt tolerated the progression of exercises  well. Still working on postural strengthening with functional strengthening as well. We adjusted the quad stretch to standing with strap around foot. Cues with squats to shift weight posteriorly. Cues with quad stretch to keep hip extension.   EVAL: Peter Scott is a 89 y.o. male who was referred to physical therapy for evaluation and treatment for B LE weakness.  Patient presents with physical impairments of LE weakness, impaired activity tolerance, impaired standing balance, and impaired ambulation impacting safe and independent functional mobility.  Examination revealed patient is at risk for falls and functional decline as evidenced by the following objective test measures: Gait speed 2.16 ft/sec, (2.62 ft/sec is needed for community access), TUG of 13.34 sec (>13.5 sec indicates increased risk for falls), 5xSTS of 18.47 sec (>15 sec indicates increased risk for falls and decreased BLE power) and FGA of 19/30 (19-24 = medium risk fall).   Elek will benefit  from skilled PT to address above deficits to improve mobility and activity tolerance to help reach the maximal level of functional independence and mobility. Patient demonstrates understanding of this POC and is in agreement with this plan.   OBJECTIVE IMPAIRMENTS: Abnormal gait, decreased activity tolerance, decreased balance, decreased endurance, decreased knowledge of condition, decreased mobility, difficulty walking, decreased ROM, decreased strength, increased fascial restrictions, impaired perceived functional ability, impaired flexibility, improper body mechanics, and postural dysfunction.   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   REHAB POTENTIAL: Good  CLINICAL DECISION MAKING: Evolving/moderate complexity  EVALUATION COMPLEXITY: Moderate   GOALS: Goals reviewed with patient? Yes  SHORT TERM GOALS: Target date: 04/29/2024  Patient will be independent with initial HEP to improve outcomes and carryover.  Baseline:  Goal status: MET - 03/30/24  2.  Patient will be educated on strategies to decrease risk of falls.  Baseline:  Goal status: MET - 03/25/24  3.  Patient will demonstrate decreased 5xSTS time to </= 15 sec to decrease risk for falls with transitional mobility. Baseline: 18.47 sec Goal status: MET - 04/06/24 - 12.19 sec  LONG TERM GOALS: Target date: 06/10/2024  Patient will be independent with advanced/ongoing HEP to facilitate ability to maintain/progress functional gains from skilled physical therapy  services. Baseline:  Goal status: IN PROGRESS  2.  Patient will be able to ambulate 600' w/o AD on variable surfaces with good safety and no DOE to access community.  Baseline: DOE with ambulation w/in clinic Goal status: IN PROGRESS  3.  Patient will be able to independently transition from floor/ground to stand to allow for increased safety when working in his garden.  Baseline: difficulty with getting up from the ground Goal status: IN PROGRESS   4.  Patient will demonstrate improved B LE strength to >/= 4+/5 for improved stability and ease of mobility . Baseline: Refer to above LE MMT table Goal status: IN PROGRESS  5.  Patient will demonstrate gait speed of >/= 2.62 ft/sec (0.8 m/s) to be a safe community ambulator with decreased risk for recurrent falls.  Baseline: 2.16 ft/sec Goal status: IN PROGRESS  6.  Patient will improve FGA score to >/= 23/30 to improve gait stability and reduce  risk for falls. Baseline: 19/30 Goal status: IN PROGRESS  7.  Patient will report >/= 49/80 on LEFS to demonstrate improved functional activity. Baseline: 40 / 80 = 50.0 % Goal status: IN PROGRESS   PLAN:  PT FREQUENCY: 1-2x/week - 2x/wk initially, potentially tapering to 1x/wk  PT DURATION: 8-12 weeks  PLANNED INTERVENTIONS: 97164- PT Re-evaluation, 97750- Physical Performance Testing, 97110-Therapeutic exercises, 97530- Therapeutic activity, 97112- Neuromuscular re-education, 97535- Self Care, 16109- Manual therapy, [phone]- Gait training, [phone]- Aquatic Therapy, Patient/Family education, Balance training, Stair training, Taping, Dry Needling, Joint mobilization, and Spinal mobilization  PLAN FOR NEXT SESSION: postural exercises and continue with core/LE strengthening and endurance   Kirk Sampley L Ekta Dancer, PTA 04/08/2024, 8:45 AM

## 2024-04-15 ENCOUNTER — Ambulatory Visit

## 2024-04-15 DIAGNOSIS — R262 Difficulty in walking, not elsewhere classified: Secondary | ICD-10-CM

## 2024-04-15 DIAGNOSIS — M6281 Muscle weakness (generalized): Secondary | ICD-10-CM | POA: Diagnosis not present

## 2024-04-15 DIAGNOSIS — R2681 Unsteadiness on feet: Secondary | ICD-10-CM

## 2024-04-15 NOTE — Therapy (Signed)
 OUTPATIENT PHYSICAL THERAPY TREATMENT   Patient Name: Peter Scott MRN: [id number] DOB:[birth year], 89 y.o., male Today's Date: 04/15/2024   END OF SESSION:  PT End of Session - 04/15/24 0852     Visit Number 9    Date for PT Re-Evaluation 06/10/24    Authorization Type Medicare & AARP Supplement    PT Start Time 0847    PT Stop Time 0929    PT Time Calculation (min) 42 min    Activity Tolerance Patient tolerated treatment well    Behavior During Therapy [facility] for tasks assessed/performed                  Past Medical History:  Diagnosis Date   Aortic aneurysm (HCC)    Aortic stenosis    CAD (coronary artery disease)    Diabetes mellitus type II, controlled (HCC)    Heart disease    Ablation   History of chicken pox    Hyperlipidemia    Hypertension    S/P CABG x 4    2001 - [place]    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   26 mm Edwards Sapien 3 transcatheter heart valve placed via percutaneous right transfemoral approach    Umbilical hernia    Past Surgical History:  Procedure Laterality Date   ABLATION     Rhythm problem; rhythm unknown; [place]   CARDIOVERSION N/A 07/14/2018   Procedure: CARDIOVERSION;  Surgeon: Liza Riggers, MD;  Location: [facility] ENDOSCOPY;  Service: Cardiovascular;  Laterality: N/A;   CARDIOVERSION N/A 08/27/2018   Procedure: CARDIOVERSION;  Surgeon: Hugh Madura, MD;  Location: [facility] ENDOSCOPY;  Service: Cardiovascular;  Laterality: N/A;   CORONARY ARTERY BYPASS GRAFT  2001   EXPLORATION POST OPERATIVE OPEN HEART  2001, June   INTRAOPERATIVE TRANSTHORACIC ECHOCARDIOGRAM N/A 04/01/2018   Procedure: INTRAOPERATIVE TRANSTHORACIC ECHOCARDIOGRAM;  Surgeon: Odie Benne, MD;  Location: [facility] OR;  Service: Open Heart Surgery;  Laterality: N/A;   RIGHT/LEFT HEART CATH AND CORONARY/GRAFT ANGIOGRAPHY N/A 01/31/2018   Procedure: RIGHT/LEFT HEART CATH AND CORONARY/GRAFT ANGIOGRAPHY;  Surgeon: Odie Benne, MD;  Location: [facility] INVASIVE CV LAB;  Service: Cardiovascular;  Laterality: N/A;   TEE WITHOUT CARDIOVERSION N/A 01/17/2018   Procedure: TRANSESOPHAGEAL ECHOCARDIOGRAM (TEE);  Surgeon: Lenise Quince, MD;  Location: [facility] ENDOSCOPY;  Service: Cardiovascular;  Laterality: N/A;   TEE WITHOUT CARDIOVERSION N/A 07/14/2018   Procedure: TRANSESOPHAGEAL ECHOCARDIOGRAM (TEE);  Surgeon: Liza Riggers, MD;  Location: [facility] ENDOSCOPY;  Service: Cardiovascular;  Laterality: N/A;   TONSILLECTOMY     TRANSCATHETER AORTIC VALVE REPLACEMENT, TRANSFEMORAL N/A 04/01/2018   Procedure: TRANSCATHETER AORTIC VALVE REPLACEMENT, TRANSFEMORAL;  Surgeon: Odie Benne, MD;  Location: [facility] OR;  Service: Open Heart Surgery;  Laterality: N/A;   UMBILICAL HERNIA REPAIR  2015   Patient Active Problem List   Diagnosis Date Noted   Paroxysmal atrial fibrillation (HCC)    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   S/P CABG x 4    Severe aortic stenosis    Medicare annual wellness visit, subsequent 10/04/2014   Bruit 09/29/2014   Abdominal aortic aneurysm (HCC) 07/23/2014   BCC (basal cell carcinoma) 07/23/2014   SCC (squamous cell carcinoma), arm 07/23/2014   Essential hypertension, benign 07/08/2014   Coronary artery disease 07/08/2014   Hyperlipidemia LDL goal <100 07/08/2014   Type II diabetes mellitus, well controlled (HCC) 07/08/2014   Colon cancer screening 07/08/2014    PCP: Copland, Skipper Dumas, MD   REFERRING PROVIDER:  Copland, Skipper Dumas, MD   REFERRING DIAG: R29.898 (ICD-10-CM) - Leg weakness, bilateral   THERAPY DIAG:  Muscle weakness (generalized)  Difficulty in walking, not elsewhere classified  Unsteadiness on feet  RATIONALE FOR EVALUATION AND TREATMENT: Rehabilitation  ONSET DATE:  worsening over the past 3-4 yrs  NEXT MD VISIT:  None scheduled    SUBJECTIVE:                                                                                                                                                                                                          SUBJECTIVE STATEMENT: Pt reports he still wants to make Friday his last appointment.  From Eval:  Pt reports good upper body strength but notes weakness and fatigue with lower body activities.  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  He stays very active but feels limited with standing activities or activities where he has to lift things.  He denies pain limitations or limitations due to balance.  PAIN: Are you having pain? No  PERTINENT HISTORY:  Aortic aneurysm, aortic stenosis, CAD s/p CABG x 4 (2001), DM-II, s/p TAVR (2019), paroxysmal afib s/p ablation, HTN, s/p umbilical hernia repair  PRECAUTIONS: Fall  RED FLAGS: None  WEIGHT BEARING RESTRICTIONS: No  FALLS:  Has patient fallen in last 6 months? 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 Be able to stand and squat."   OBJECTIVE: (objective measures completed at initial evaluation unless otherwise dated)  DIAGNOSTIC FINDINGS:  N/A  COGNITION: Overall cognitive status: Within functional limits for tasks assessed   SENSATION: WFL  POSTURE:  rounded shoulders, forward head, and flexed trunk   MUSCLE LENGTH: Hamstrings: mod/severe tight B ITB: mild tight/WFL Piriformis: mod tight B Hip flexors: mod tight B Quads: mild/mod tight B Heelcord: NT  LOWER  EXTREMITY ROM:    WFL other than limitations related to muscle tightness as indicated above  LOWER EXTREMITY MMT:    MMT Right eval Left  eval  Hip flexion 4 4  Hip extension 3+ 3+  Hip abduction 4- 4-  Hip adduction 4 4  Hip internal rotation 4 4  Hip external rotation 4- 4-  Knee flexion 4+ 4+  Knee extension 4+ 4+  Ankle dorsiflexion 4- 4-  Ankle plantarflexion 4 (14 SLS HR) 4 (15 SLS HR)  Ankle inversion    Ankle eversion    (Blank rows = not tested)  BED MOBILITY:  Independent  TRANSFERS: Assistive device utilized: None  Sit to stand: Complete Independence Stand to sit: Complete Independence Chair to chair: Modified independence Floor: NT  GAIT: Distance walked: clinic distances Assistive device utilized: None Level of assistance: Complete Independence Gait pattern: trunk flexed Comments: fatigue with increased SOB/DOE noted during ambulation for FGA  STAIRS:  Level of Assistance: Modified independence  Stair Negotiation Technique: Alternating Pattern  with Single Rail on Right  Number of Stairs: 14   Height of Stairs: 7"  Comments: increased SOB due to fatigue after attempt  FUNCTIONAL TESTS:  5 times sit to stand: 18.47 sec Timed up and go (TUG): 13.34 sec 10 meter walk test: 15.16 sec; Gait speed = 2.16 ft/sec  Functional gait assessment: 19/30; 19-24 = medium risk fall  04/06/24: 5xSTS = 12.19 sec  PATIENT SURVEYS:  LEFS 40 / 80 = 50.0 %; 41-60: Mild to moderate functional limitation   TODAY'S TREATMENT:  04/15/24  NuStep - L6 x 6 min Floor transfers assessed Leg press 35lb 2x10 BLE Standing shoulder ext 15lb 2x10  Leg curls 25lb x 10 BLE Leg ext 15lb 2 x 10 BLE   [facility] PT Assessment - 04/15/24 0001       Functional Gait  Assessment   Gait Level Surface Walks 20 ft, slow speed, abnormal gait pattern, evidence for imbalance or deviates 10-15 in outside of the 12 in walkway width. Requires more than 7 sec to ambulate 20 ft.    Change in Gait  Speed Able to smoothly change walking speed without loss of balance or gait deviation. Deviate no more than 6 in outside of the 12 in walkway width.    Gait with Horizontal Head Turns Performs head turns smoothly with slight change in gait velocity (eg, minor disruption to smooth gait path), deviates 6-10 in outside 12 in walkway width, or uses an assistive device.    Gait with Vertical Head Turns Performs task with slight change in gait velocity (eg, minor disruption to smooth gait path), deviates 6 - 10 in outside 12 in walkway width or uses assistive device    Gait and Pivot Turn Pivot turns safely within 3 sec and stops quickly with no loss of balance.    Step Over Obstacle Is able to step over 2 stacked shoe boxes taped together (9 in total height) without changing gait speed. No evidence of imbalance.    Gait with Narrow Base of Support Ambulates 7-9 steps.    Gait with Eyes Closed Walks 20 ft, slow speed, abnormal gait pattern, evidence for imbalance, deviates 10-15 in outside 12 in walkway width. Requires more than 9 sec to ambulate 20 ft.    Ambulating Backwards Walks 20 ft, slow speed, abnormal gait pattern, evidence for imbalance, deviates 10-15 in outside 12 in walkway width.    Steps Alternating feet, must use rail.    Total Score 20             04/08/24 THERAPEUTIC EXERCISE: To improve strength, endurance, and flexibility.  Demonstration, verbal and tactile  cues throughout for technique.  NuStep - L6 x 6 min Leg curls 20lb x 10 BLE Leg ext 15lb 2 x 10 BLE Standing quad stretch with strap 2x15" B Seated hamstring stretch + gastroc w/ strap  NEUROMUSCULAR RE-EDUCATION: To improve balance, coordination, kinesthesia, posture, proprioception, and reduce fall risk.  Standing shoulder ext GTB 2x10 Standing rows GTB 2x10  Pallof press GTB standing x 20 B Squat with 10lb kettle bell 2 x 10 Standing bicep curl 10lb x 10  04/06/24 THERAPEUTIC EXERCISE: To improve strength, endurance,  and flexibility.  Demonstration, verbal and tactile cues throughout for technique.  NuStep - L6 x 6 min Seated lunge position hip flexor stretch over edge of chair 2x 30" Seated RTB alternating hip ABD/ER clam x 10  THERAPEUTIC ACTIVITIES: To improve functional performance.  Demonstration, verbal and tactile cues throughout for technique. 5xSTS = 12.19 sec  NEUROMUSCULAR RE-EDUCATION: To improve balance, coordination, kinesthesia, posture, proprioception, and reduce fall risk.  SLS + RTB 4-way SLR x 10 bil, UE support on back of chair for balance Standing RTB band march with band looped at mid feet x10   04/03/24 THERAPEUTIC EXERCISE: To improve strength, endurance, ROM, and flexibility.  Demonstration, verbal and tactile cues throughout for technique.  Nustep L5x6min UE/LE THERAPEUTIC ACTIVITIES: To improve functional performance.  Demonstration, verbal and tactile cues throughout for technique. STS + OHP x 10 with 10lb weight Raking GTB each side x 10  Step ups 6' x 10; 8' x 10 BLE SLS + RTB 4-way SLR x 10 bil, UE support on back of chair for balance   03/30/24 THERAPEUTIC EXERCISE: To improve strength, endurance, ROM, and flexibility.  Demonstration, verbal and tactile cues throughout for technique.  Rec Bike - L2 x 6 min Standing hip hinge x 10 Lumbar/hip extension in standing with glutes against counter and hands on hips to promote chest opening 2 x 10  NEUROMUSCULAR RE-EDUCATION: To improve balance, coordination, kinesthesia, posture, proprioception, and reduce fall risk.  Functional squat + B OH reach x 10 Functional squat + B OH reach holding 3# db bil x 10 SLS + RTB 4-way SLR x 10 bil, UE support on back of chair for balance Standing B RTB pallof press x 10   03/27/24 THERAPEUTIC EXERCISE: To improve strength, endurance, and flexibility.  Demonstration, verbal and tactile cues throughout for technique.  NuStep - L5 x 6 min  Mod thomas quad/hip flexor stretch 3 x 30"  bil  THERAPEUTIC ACTIVITIES: To improve functional performance.  Demonstration, verbal and tactile cues throughout for technique. Tall kneel hip hinge (knees on Airex pad on yoga mat with pillow behind knee between glutes and feet) x 10 Quadruped press-up to tall knee x 10 Quadruped alt step to side x 10 Quadruped alt step to side + press-up to 1/2 kneel x 10  NEUROMUSCULAR RE-EDUCATION: To improve balance, coordination, kinesthesia, and posture. Supported squat with glute touch to mat table x 10 Lunge x 10 bil, single UE support on back of chair - cues for upright posture, limited by quad/hip flexor tightness   03/25/24 Nustep L5x29min Therapeutic Activity: to improve functional performance. 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 Standing rows and shoulder extension RTB x 15 Standing hip abduction to extension 3lb x 10 BLE Standing straight knee hip flexion  3lb  x 10BLE   PATIENT EDUCATION:  Education details: continue with current HEP  Person educated: Patient Education method: Explanation Education comprehension: verbalized understanding  HOME EXERCISE PROGRAM: *Pt using MedBridge[employer] app.  Access Code: [email or web address] URL: [email or web address] Date: 04/06/2024 Prepared by: Felecia Hopper  Exercises - Seated Hip Flexor Stretch  - 1-2 x daily - 7 x weekly - 3 reps - 30 sec hold - Standing Shoulder Row with Anchored Resistance  - 1 x daily - 3 x weekly - 2 sets - 10 reps - Shoulder Extension with Anchored Resistance  - 1 x daily - 3 x weekly - 2 sets - 10 reps - Heel Toe Raises with Unilateral Counter Support  - 1 x daily - 7 x weekly - 2 sets - 10 reps - 3 hold - Sit to Stand  - 1 x daily - 7 x weekly - 2 sets - 10 reps - Forward Step Up with Counter Support  - 1 x daily  - 7 x weekly - 2 sets - 10 reps - Seated Isometric Hip Abduction with Resistance  - 1 x daily - 3 x weekly - 2 sets - 10 reps - 3 sec hold - Marching with Resistance  - 1 x daily - 3 x weekly - 2 sets - 10 reps - 3 sec hold - Standing Hip Flexion with Anchored Resistance and Chair Support  - 1 x daily - 3 x weekly - 2 sets - 10 reps - 3 sec hold - Standing Hip Adduction with Anchored Resistance  - 1 x daily - 3 x weekly - 2 sets - 10 reps - 3 sec hold - Standing Hip Extension with Resistance  - 1 x daily - 3 x weekly - 2 sets - 10 reps - 3 sec hold - Standing Hip Abduction with Resistance  - 1 x daily - 3 x weekly - 2 sets - 10 reps - 3 sec hold   ASSESSMENT:  CLINICAL IMPRESSION: Today we assessed patient's ability to perform floor transfers, FGA, and gait speed. He has met LTG #3 for floor transfers. Gait speed has improved but not met the goal value. We did machine interventions for bulk strengthening and muscular endurance to assist with farmer activities and ADLs. Pt mildly fatigued with interventions. He plans on going on hold next visit.   EVAL: Peter Scott is a 89 y.o. male who was referred to physical therapy for evaluation and treatment for B LE weakness.  Patient presents with physical impairments of LE weakness, impaired activity tolerance, impaired standing balance, and impaired ambulation impacting safe and independent functional mobility.  Examination revealed patient is at risk for falls and functional decline as evidenced by the following objective test measures: Gait speed 2.16 ft/sec, (2.62 ft/sec is needed for community access), TUG of 13.34 sec (>13.5 sec indicates increased risk for falls), 5xSTS of 18.47 sec (>15 sec indicates increased risk for falls and decreased BLE power) and FGA of 19/30 (19-24 = medium risk fall).   Sayed will benefit from skilled PT to address above deficits to improve mobility and activity tolerance to help reach the maximal level of functional  independence and mobility. Patient demonstrates understanding of this POC and is in agreement with this plan.   OBJECTIVE IMPAIRMENTS: Abnormal gait, decreased activity tolerance, decreased balance, decreased endurance, decreased knowledge of condition, decreased mobility, difficulty walking, decreased ROM, decreased strength, increased fascial restrictions, impaired perceived functional ability, impaired flexibility, improper body mechanics, and postural dysfunction.   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   REHAB POTENTIAL: Good  CLINICAL DECISION MAKING: Evolving/moderate complexity  EVALUATION COMPLEXITY: Moderate   GOALS: Goals reviewed with patient? Yes  SHORT TERM GOALS: Target date: 04/29/2024  Patient will be independent with initial HEP to improve outcomes and carryover.  Baseline:  Goal status: MET - 03/30/24  2.  Patient will be educated on strategies to decrease risk of falls.  Baseline:  Goal status: MET - 03/25/24  3.  Patient will demonstrate decreased 5xSTS time to </= 15 sec to decrease risk for falls with transitional mobility. Baseline: 18.47 sec Goal status: MET - 04/06/24 - 12.19 sec  LONG TERM GOALS: Target date: 06/10/2024  Patient will be independent with advanced/ongoing HEP to facilitate ability to maintain/progress functional gains from skilled physical therapy services. Baseline:  Goal status: IN PROGRESS  2.  Patient will be able to ambulate 600' w/o AD on variable surfaces with good  safety and no DOE to access community.  Baseline: DOE with ambulation w/in clinic Goal status: IN PROGRESS  3.  Patient will be able to independently transition from floor/ground to stand to allow for increased safety when working in his garden.  Baseline: difficulty with getting up from the ground Goal status: MET- 04/15/24  4.  Patient will demonstrate improved B LE strength to >/= 4+/5 for improved stability and ease of mobility . Baseline: Refer to above LE MMT table Goal status: IN PROGRESS  5.  Patient will demonstrate gait speed of >/= 2.62 ft/sec (0.8 m/s) to be a safe community ambulator with decreased risk for recurrent falls.  Baseline: 2.16 ft/sec Goal status: NOT MET- 04/15/24 1.2 m/s  6.  Patient will improve FGA score to >/= 23/30 to improve gait stability and reduce risk for falls. Baseline: 19/30 Goal status: NOT MET- 20/30 04/15/24  7.  Patient will report >/= 49/80 on LEFS to demonstrate improved functional activity. Baseline: 40 / 80 = 50.0 % Goal status: IN PROGRESS   PLAN:  PT FREQUENCY: 1-2x/week - 2x/wk initially, potentially tapering to 1x/wk  PT DURATION: 8-12 weeks  PLANNED INTERVENTIONS: [id number]- PT Re-evaluation, 97750- Physical Performance Testing, 97110-Therapeutic exercises, 97530- Therapeutic activity, 97112- Neuromuscular re-education, 97535- Self Care, 16109- Manual therapy, [id number]- Gait training, [id number]- Aquatic Therapy, Patient/Family education, Balance training, Stair training, Taping, Dry Needling, Joint mobilization, and Spinal mobilization  PLAN FOR NEXT SESSION: postural exercises and continue with core/LE strengthening and endurance   Meleane Selinger L Kennia Vanvorst, PTA 04/15/2024, 9:29 AM

## 2024-04-17 ENCOUNTER — Ambulatory Visit

## 2024-04-17 DIAGNOSIS — M6281 Muscle weakness (generalized): Secondary | ICD-10-CM

## 2024-04-17 DIAGNOSIS — R2681 Unsteadiness on feet: Secondary | ICD-10-CM | POA: Diagnosis not present

## 2024-04-17 DIAGNOSIS — R262 Difficulty in walking, not elsewhere classified: Secondary | ICD-10-CM | POA: Diagnosis not present

## 2024-04-17 NOTE — Therapy (Addendum)
 OUTPATIENT PHYSICAL THERAPY TREATMENT / DISCHARGE SUMMARY  Progress Note  Reporting Period 03/18/2024 to 04/17/2024  See note below for Objective Data and Assessment of Progress/Goals.     Patient Name: Peter Scott MRN: [id number] DOB:1932/06/09, 89 y.o., male Today's Date: 04/17/2024   END OF SESSION:  PT End of Session - 04/17/24 0847     Visit Number 10    Date for PT Re-Evaluation 06/10/24    Authorization Type Medicare & AARP Supplement    PT Start Time 0844    PT Stop Time 0927    PT Time Calculation (min) 43 min    Activity Tolerance Patient tolerated treatment well    Behavior During Therapy [facility] for tasks assessed/performed                   Past Medical History:  Diagnosis Date   Aortic aneurysm (HCC)    Aortic stenosis    CAD (coronary artery disease)    Diabetes mellitus type II, controlled (HCC)    Heart disease    Ablation   History of chicken pox    Hyperlipidemia    Hypertension    S/P CABG x 4    2001 - [place]    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   26 mm Edwards Sapien 3 transcatheter heart valve placed via percutaneous right transfemoral approach    Umbilical hernia    Past Surgical History:  Procedure Laterality Date   ABLATION     Rhythm problem; rhythm unknown; [place]   CARDIOVERSION N/A 07/14/2018   Procedure: CARDIOVERSION;  Surgeon: Moose Leim DEL, MD;  Location: [facility] ENDOSCOPY;  Service: Cardiovascular;  Laterality: N/A;   CARDIOVERSION N/A 08/27/2018   Procedure: CARDIOVERSION;  Surgeon: Jeffrie Oneil BROCKS, MD;  Location: [facility] ENDOSCOPY;  Service: Cardiovascular;  Laterality: N/A;   CORONARY ARTERY BYPASS GRAFT  2001   EXPLORATION POST OPERATIVE OPEN HEART  2001, June   INTRAOPERATIVE TRANSTHORACIC ECHOCARDIOGRAM N/A 04/01/2018   Procedure: INTRAOPERATIVE TRANSTHORACIC ECHOCARDIOGRAM;  Surgeon: Verlin Lonni BIRCH, MD;  Location: [facility] OR;  Service: Open Heart Surgery;  Laterality: N/A;    RIGHT/LEFT HEART CATH AND CORONARY/GRAFT ANGIOGRAPHY N/A 01/31/2018   Procedure: RIGHT/LEFT HEART CATH AND CORONARY/GRAFT ANGIOGRAPHY;  Surgeon: Verlin Lonni BIRCH, MD;  Location: [facility] INVASIVE CV LAB;  Service: Cardiovascular;  Laterality: N/A;   TEE WITHOUT CARDIOVERSION N/A 01/17/2018   Procedure: TRANSESOPHAGEAL ECHOCARDIOGRAM (TEE);  Surgeon: Pietro Redell RAMAN, MD;  Location: [facility] ENDOSCOPY;  Service: Cardiovascular;  Laterality: N/A;   TEE WITHOUT CARDIOVERSION N/A 07/14/2018   Procedure: TRANSESOPHAGEAL ECHOCARDIOGRAM (TEE);  Surgeon: Moose Leim DEL, MD;  Location: 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   S/P CABG x 4    Severe aortic stenosis    Medicare annual wellness visit, subsequent 10/04/2014   Bruit 09/29/2014   Abdominal aortic aneurysm (HCC) 07/23/2014   BCC (basal cell carcinoma) 07/23/2014   SCC (squamous cell carcinoma), arm 07/23/2014   Essential hypertension, benign 07/08/2014   Coronary artery disease 07/08/2014   Hyperlipidemia LDL goal <100 07/08/2014  Type II diabetes mellitus, well controlled (HCC) 07/08/2014   Colon cancer screening 07/08/2014    PCP: Copland, Harlene BROCKS, MD   REFERRING PROVIDER: Watt Harlene BROCKS, MD   REFERRING DIAG: R29.898 (ICD-10-CM) - Leg weakness, bilateral   THERAPY DIAG:  Muscle weakness (generalized)  Difficulty in walking, not elsewhere classified  Unsteadiness on feet  RATIONALE FOR EVALUATION AND TREATMENT: Rehabilitation  ONSET DATE:  worsening over the  past 3-4 yrs  NEXT MD VISIT:  None scheduled    SUBJECTIVE:                                                                                                                                                                                                         SUBJECTIVE STATEMENT: Pt reports he still feels good, wants to make today his last appointment.   From Eval:  Pt reports good upper body strength but notes weakness and fatigue with lower body activities.  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  He stays very active but feels limited with standing activities or activities where he has to lift things.  He denies pain limitations or limitations due to balance.  PAIN: Are you having pain? No  PERTINENT HISTORY:  Aortic aneurysm, aortic stenosis, CAD s/p CABG x 4 (2001), DM-II, s/p TAVR (2019), paroxysmal afib s/p ablation, HTN, s/p umbilical hernia repair  PRECAUTIONS: Fall  RED FLAGS: None  WEIGHT BEARING RESTRICTIONS: No  FALLS:  Has patient fallen in last 6 months? 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 Be able to stand and squat.   OBJECTIVE: (objective measures completed at initial evaluation unless otherwise dated)  DIAGNOSTIC FINDINGS:  N/A  COGNITION: Overall cognitive status: Within functional limits for tasks assessed   SENSATION: WFL  POSTURE:  rounded shoulders, forward head, and flexed trunk    MUSCLE LENGTH: Hamstrings: mod/severe tight B ITB: mild tight/WFL Piriformis: mod tight B Hip flexors: mod tight B Quads: mild/mod tight B Heelcord: NT  LOWER EXTREMITY ROM:    WFL other than limitations related to muscle tightness as indicated above  LOWER EXTREMITY MMT:    MMT Right eval Left eval R 04/17/24 L 04/17/24  Hip flexion 4 4 4+ 4+  Hip extension 3+ 3+ 3+ 3+  Hip abduction 4- 4- 4 4  Hip adduction 4 4 4  4+  Hip internal rotation 4 4 4+ 4+  Hip external rotation 4- 4- 4 4  Knee flexion 4+ 4+    Knee extension 4+ 4+ 5 5  Ankle dorsiflexion 4- 4- 4 4+  Ankle plantarflexion 4 (14 SLS HR) 4 (15 SLS HR) 5 5  Ankle inversion      Ankle eversion      (Blank rows = not tested)  BED MOBILITY:  Independent  TRANSFERS: Assistive device utilized: None  Sit to stand: Complete Independence Stand to sit: Complete Independence Chair to chair: Modified independence Floor: NT  GAIT: Distance walked: clinic distances Assistive device utilized: None Level of assistance: Complete Independence Gait pattern: trunk flexed Comments: fatigue with increased SOB/DOE noted during ambulation for FGA  STAIRS:  Level of Assistance: Modified independence  Stair Negotiation Technique: Alternating Pattern  with Single Rail on Right  Number of Stairs: 14   Height of Stairs: 7  Comments: increased SOB due to fatigue after attempt  FUNCTIONAL TESTS:  5 times sit to stand: 18.47 sec Timed up and go (TUG): 13.34 sec 10 meter walk test: 15.16 sec; Gait speed = 2.16 ft/sec (0.66 m/s)  Functional gait assessment: 19/30; 19-24 = medium risk fall  04/06/24: 5xSTS = 12.19 sec  PATIENT SURVEYS:  LEFS 40 / 80 = 50.0 %; 41-60: Mild to moderate functional limitation   TODAY'S TREATMENT:   04/17/24  NuStep - L6 x 6 min Gait Training: 600 ft - able to make it the whole way w/o rest, however labored breathing and SOB towards the end, SPO2- 92% afterwards LE MMT- see MMT chart LEFS- see  LTG #7 Review of HEP   04/15/24  NuStep - L6 x 6 min Floor transfers assessed Leg press 35lb 2x10 BLE Standing shoulder ext 15lb 2x10  Leg curls 25lb x 10 BLE Leg ext 15lb 2 x 10 BLE  Functional Gait  Assessment  Gait Level Surface Walks 20 ft, slow speed, abnormal gait pattern, evidence for imbalance or deviates 10-15 in outside of the 12 in walkway width. Requires more than 7 sec to ambulate 20 ft.   Change in Gait Speed Able to smoothly change walking speed without loss of balance or gait deviation. Deviate no more than 6 in outside of the 12 in walkway width.   Gait with Horizontal Head Turns Performs head turns smoothly with slight change in gait velocity (eg, minor disruption to smooth gait path), deviates 6-10 in outside 12 in walkway width, or uses an assistive device.   Gait with Vertical Head Turns Performs task with slight change in gait velocity (eg, minor disruption to smooth gait path), deviates 6 - 10 in outside 12 in walkway width or uses assistive device   Gait and Pivot Turn Pivot turns safely within 3 sec and stops quickly with no loss of balance.   Step Over Obstacle Is able to step over 2 stacked shoe boxes taped together (9 in total height) without changing gait speed. No evidence of imbalance.   Gait with Narrow Base of Support Ambulates 7-9 steps.   Gait with Eyes Closed Walks 20 ft, slow speed, abnormal gait pattern,  evidence for imbalance, deviates 10-15 in outside 12 in walkway width. Requires more than 9 sec to ambulate 20 ft.   Ambulating Backwards Walks 20 ft, slow speed, abnormal gait pattern, evidence for imbalance, deviates 10-15 in outside 12 in walkway width.   Steps Alternating feet, must use rail.   Total Score 20       04/08/24 THERAPEUTIC EXERCISE: To improve strength, endurance, and flexibility.  Demonstration, verbal and tactile cues throughout for technique.  NuStep - L6 x 6 min Leg curls 20lb x 10 BLE Leg ext 15lb 2 x 10 BLE Standing quad  stretch with strap 2x15 B Seated hamstring stretch + gastroc w/ strap  NEUROMUSCULAR RE-EDUCATION: To improve balance, coordination, kinesthesia, posture, proprioception, and reduce fall risk.  Standing shoulder ext GTB 2x10 Standing rows GTB 2x10  Pallof press GTB standing x 20 B Squat with 10lb kettle bell 2 x 10 Standing bicep curl 10lb x 10   04/06/24 THERAPEUTIC EXERCISE: To improve strength, endurance, and flexibility.  Demonstration, verbal and tactile cues throughout for technique.  NuStep - L6 x 6 min Seated lunge position hip flexor stretch over edge of chair 2x 30 Seated RTB alternating hip ABD/ER clam x 10  THERAPEUTIC ACTIVITIES: To improve functional performance.  Demonstration, verbal and tactile cues throughout for technique. 5xSTS = 12.19 sec  NEUROMUSCULAR RE-EDUCATION: To improve balance, coordination, kinesthesia, posture, proprioception, and reduce fall risk.  SLS + RTB 4-way SLR x 10 bil, UE support on back of chair for balance Standing RTB band march with band looped at mid feet x10   04/03/24 THERAPEUTIC EXERCISE: To improve strength, endurance, ROM, and flexibility.  Demonstration, verbal and tactile cues throughout for technique.  Nustep L5x6min UE/LE THERAPEUTIC ACTIVITIES: To improve functional performance.  Demonstration, verbal and tactile cues throughout for technique. STS + OHP x 10 with 10lb weight Raking GTB each side x 10  Step ups 6' x 10; 8' x 10 BLE SLS + RTB 4-way SLR x 10 bil, UE support on back of chair for balance   03/30/24 THERAPEUTIC EXERCISE: To improve strength, endurance, ROM, and flexibility.  Demonstration, verbal and tactile cues throughout for technique.  Rec Bike - L2 x 6 min Standing hip hinge x 10 Lumbar/hip extension in standing with glutes against counter and hands on hips to promote chest opening 2 x 10  NEUROMUSCULAR RE-EDUCATION: To improve balance, coordination, kinesthesia, posture, proprioception, and reduce fall  risk.  Functional squat + B OH reach x 10 Functional squat + B OH reach holding 3# db bil x 10 SLS + RTB 4-way SLR x 10 bil, UE support on back of chair for balance Standing B RTB pallof press x 10   03/27/24 THERAPEUTIC EXERCISE: To improve strength, endurance, and flexibility.  Demonstration, verbal and tactile cues throughout for technique.  NuStep - L5 x 6 min  Mod thomas quad/hip flexor stretch 3 x 30 bil  THERAPEUTIC ACTIVITIES: To improve functional performance.  Demonstration, verbal and tactile cues throughout for technique. Tall kneel hip hinge (knees on Airex pad on yoga mat with pillow behind knee between glutes and feet) x 10 Quadruped press-up to tall knee x 10 Quadruped alt step to side x 10 Quadruped alt step to side + press-up to 1/2 kneel x 10  NEUROMUSCULAR RE-EDUCATION: To improve balance, coordination, kinesthesia, and posture. Supported squat with glute touch to mat table x 10 Lunge x 10 bil, single UE support on back of chair - cues for upright posture, limited by  quad/hip flexor tightness   03/25/24 Nustep L5x74min Therapeutic Activity: to improve functional performance. 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 Standing rows and shoulder extension RTB x 15 Standing hip abduction to extension 3lb x 10 BLE Standing straight knee hip flexion  3lb x 10BLE   PATIENT EDUCATION:  Education details: continue with current HEP  Person educated: Patient Education method: Explanation Education comprehension: verbalized understanding  HOME EXERCISE PROGRAM: *Pt using MedBridgeGO app.  Access Code: [email or web address] URL: [email or web address] Date: 04/06/2024 Prepared by: Peter Scott  Exercises - Seated Hip Flexor Stretch  - 1-2 x daily - 7 x weekly - 3 reps  - 30 sec hold - Standing Shoulder Row with Anchored Resistance  - 1 x daily - 3 x weekly - 2 sets - 10 reps - Shoulder Extension with Anchored Resistance  - 1 x daily - 3 x weekly - 2 sets - 10 reps - Heel Toe Raises with Unilateral Counter Support  - 1 x daily - 7 x weekly - 2 sets - 10 reps - 3 hold - Sit to Stand  - 1 x daily - 7 x weekly - 2 sets - 10 reps - Forward Step Up with Counter Support  - 1 x daily - 7 x weekly - 2 sets - 10 reps - Seated Isometric Hip Abduction with Resistance  - 1 x daily - 3 x weekly - 2 sets - 10 reps - 3 sec hold - Marching with Resistance  - 1 x daily - 3 x weekly - 2 sets - 10 reps - 3 sec hold - Standing Hip Flexion with Anchored Resistance and Chair Support  - 1 x daily - 3 x weekly - 2 sets - 10 reps - 3 sec hold - Standing Hip Adduction with Anchored Resistance  - 1 x daily - 3 x weekly - 2 sets - 10 reps - 3 sec hold - Standing Hip Extension with Resistance  - 1 x daily - 3 x weekly - 2 sets - 10 reps - 3 sec hold - Standing Hip Abduction with Resistance  - 1 x daily - 3 x weekly - 2 sets - 10 reps - 3 sec hold   ASSESSMENT:  CLINICAL IMPRESSION: Today we assessed patient's ability to walk for long periods, his LE strength and LEFS. Pt continues to have SOB walking for longer distances. Strength has improved but weakness still seen in gluteal musculature. LEFS score improved and met his LTG. We added a modified leg press with black TB to simulate the machine at home. There are some goals not met at this time, however pt is pleased with progress and request to go on hold from PT as of today.   EVAL: Peter Scott is a 89 y.o. male who was referred to physical therapy for evaluation and treatment for B LE weakness.  Patient presents with physical impairments of LE weakness, impaired activity tolerance, impaired standing balance, and impaired ambulation impacting safe and independent functional mobility.  Examination revealed patient is at risk for falls and  functional decline as evidenced by the following objective test measures: Gait speed 2.16 ft/sec, (2.62 ft/sec is needed for community access), TUG of 13.34 sec (>13.5 sec indicates increased risk for falls), 5xSTS of 18.47 sec (>15 sec indicates increased risk for falls  and decreased BLE power) and FGA of 19/30 (19-24 = medium risk fall).   Peter Scott will benefit from skilled PT to address above deficits to improve mobility and activity tolerance to help reach the maximal level of functional independence and mobility. Patient demonstrates understanding of this POC and is in agreement with this plan.   OBJECTIVE IMPAIRMENTS: Abnormal gait, decreased activity tolerance, decreased balance, decreased endurance, decreased knowledge of condition, decreased mobility, difficulty walking, decreased ROM, decreased strength, increased fascial restrictions, impaired perceived functional ability, impaired flexibility, improper body mechanics, and postural dysfunction.   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   REHAB POTENTIAL: Good  CLINICAL DECISION MAKING: Evolving/moderate complexity  EVALUATION COMPLEXITY: Moderate   GOALS: Goals reviewed with patient? Yes  SHORT TERM GOALS: Target date: 04/29/2024  Patient will be independent with initial HEP to improve outcomes and carryover.  Baseline:  Goal status: MET - 03/30/24  2.  Patient will be educated on strategies to decrease risk of falls.  Baseline:  Goal status: MET - 03/25/24  3.  Patient  will demonstrate decreased 5xSTS time to </= 15 sec to decrease risk for falls with transitional mobility. Baseline: 18.47 sec Goal status: MET - 04/06/24 - 12.19 sec  LONG TERM GOALS: Target date: 06/10/2024  Patient will be independent with advanced/ongoing HEP to facilitate ability to maintain/progress functional gains from skilled physical therapy services. Baseline:  Goal status: MET - 04/17/24  2.  Patient will be able to ambulate 600' w/o AD on variable surfaces with good safety and no DOE to access community.  Baseline: DOE with ambulation w/in clinic Goal status: NOT MET - 04/17/24  3.  Patient will be able to independently transition from floor/ground to stand to allow for increased safety when working in his garden.  Baseline: difficulty with getting up from the ground Goal status: MET - 04/15/24  4.  Patient will demonstrate improved B LE strength to >/= 4+/5 for improved stability and ease of mobility . Baseline: Refer to above LE MMT table Goal status: PARTIALLY MET - 04/17/24  5.  Patient will demonstrate gait speed of >/= 2.62 ft/sec (0.8 m/s) to be a safe community ambulator with decreased risk for recurrent falls.  Baseline: 2.16 ft/sec (0.66 m/s)  Goal status: MET - 04/15/24 - 1.2 m/s  6.  Patient will improve FGA score to >/= 23/30 to improve gait stability and reduce risk for falls. Baseline: 19/30 Goal status: NOT MET - 04/15/24 - 20/30   7.  Patient will report >/= 49/80 on LEFS to demonstrate improved functional activity. Baseline: 40 / 80 = 50.0 % Goal status: MET - 04/17/24 - Lower Extremity Functional Score: 60 / 80 = 75.0 %    PLAN:  PT FREQUENCY: 1-2x/week - 2x/wk initially, potentially tapering to 1x/wk  PT DURATION: 8-12 weeks  PLANNED INTERVENTIONS: [id number]- PT Re-evaluation, 97750- Physical Performance Testing, 97110-Therapeutic exercises, 97530- Therapeutic activity, 97112- Neuromuscular re-education, 97535- Self Care, [id number]- Manual therapy, [phone]- Gait  training, [phone]- Aquatic Therapy, Patient/Family education, Balance training, Stair training, Taping, Dry Needling, Joint mobilization, and Spinal mobilization  PLAN FOR NEXT SESSION: 30 day hold   Peter Scott  Peter Scott, PTA 04/17/2024, 9:28 AM   Peter Scott has demonstrated good progress with PT since eval on 03/18/2024.  Gains noted in overall B LE strength with greatest weakness still in B hip extension at 3+/5.  Gait speed has improved from 0.66 m/s to 1.2 m/s although he continues to demonstrate DOE with ambulation long distances.  FGA has improved from 19/30 to 20/30, but remains shy of goal of 23/30.  LEFS demonstrating improved perceived function from 50% to 75%.  Majority of PT goals now met or partially met and Peter Scott feels ready to transition to his HEP but would like to remain on hold for 30-days in the event that issues arise that would necessitate a return to PT.   Peter Scott, PT 04/20/2024, 7:37 PM  [facility] [address]  [address] [place], [place], [address] Phone: [phone]   Fax:  [phone]    PHYSICAL THERAPY DISCHARGE SUMMARY  Visits from Start of Care: 10  Current functional level related to goals / functional outcomes: Refer to above clinical impression and goal assessment for status as of last visit on 04/20/2024. Patient was placed on hold for 30 days and has not needed to return to PT, therefore will proceed with discharge from PT for this episode.     Remaining deficits: As above.  Patient did not return to PT.   Education / Equipment: HEP  Patient agrees to discharge. Patient goals were partially met. Patient is being discharged due to being pleased with the current functional level.  Peter Scott, PT 10/21/2024, 3:26 PM  [facility] [address]  [address] [place], [place], [address] Phone: [phone]   Fax:  [phone]

## 2024-05-11 ENCOUNTER — Other Ambulatory Visit: Payer: Self-pay

## 2024-07-03 ENCOUNTER — Encounter: Payer: Self-pay | Admitting: Cardiology

## 2024-07-14 ENCOUNTER — Emergency Department (HOSPITAL_BASED_OUTPATIENT_CLINIC_OR_DEPARTMENT_OTHER)

## 2024-07-14 ENCOUNTER — Encounter (HOSPITAL_BASED_OUTPATIENT_CLINIC_OR_DEPARTMENT_OTHER): Payer: Self-pay

## 2024-07-14 ENCOUNTER — Other Ambulatory Visit: Payer: Self-pay

## 2024-07-14 ENCOUNTER — Inpatient Hospital Stay (HOSPITAL_BASED_OUTPATIENT_CLINIC_OR_DEPARTMENT_OTHER)
Admission: EM | Admit: 2024-07-14 | Discharge: 2024-07-19 | DRG: 291 | Disposition: A | Discharge status: Home / Self Care | Attending: Internal Medicine | Admitting: Internal Medicine

## 2024-07-14 DIAGNOSIS — I272 Pulmonary hypertension, unspecified: Secondary | ICD-10-CM | POA: Diagnosis present

## 2024-07-14 DIAGNOSIS — I2584 Coronary atherosclerosis due to calcified coronary lesion: Secondary | ICD-10-CM | POA: Diagnosis not present

## 2024-07-14 DIAGNOSIS — J9601 Acute respiratory failure with hypoxia: Secondary | ICD-10-CM | POA: Diagnosis present

## 2024-07-14 DIAGNOSIS — Z7901 Long term (current) use of anticoagulants: Secondary | ICD-10-CM | POA: Diagnosis not present

## 2024-07-14 DIAGNOSIS — Z794 Long term (current) use of insulin: Secondary | ICD-10-CM | POA: Diagnosis not present

## 2024-07-14 DIAGNOSIS — Z951 Presence of aortocoronary bypass graft: Secondary | ICD-10-CM

## 2024-07-14 DIAGNOSIS — I480 Paroxysmal atrial fibrillation: Secondary | ICD-10-CM | POA: Diagnosis present

## 2024-07-14 DIAGNOSIS — E876 Hypokalemia: Secondary | ICD-10-CM | POA: Diagnosis not present

## 2024-07-14 DIAGNOSIS — I2583 Coronary atherosclerosis due to lipid rich plaque: Secondary | ICD-10-CM

## 2024-07-14 DIAGNOSIS — J90 Pleural effusion, not elsewhere classified: Secondary | ICD-10-CM | POA: Diagnosis not present

## 2024-07-14 DIAGNOSIS — N1832 Chronic kidney disease, stage 3b: Secondary | ICD-10-CM | POA: Diagnosis present

## 2024-07-14 DIAGNOSIS — Z87891 Personal history of nicotine dependence: Secondary | ICD-10-CM | POA: Diagnosis not present

## 2024-07-14 DIAGNOSIS — Z8249 Family history of ischemic heart disease and other diseases of the circulatory system: Secondary | ICD-10-CM | POA: Diagnosis not present

## 2024-07-14 DIAGNOSIS — R0602 Shortness of breath: Secondary | ICD-10-CM | POA: Diagnosis not present

## 2024-07-14 DIAGNOSIS — Z888 Allergy status to other drugs, medicaments and biological substances status: Secondary | ICD-10-CM | POA: Diagnosis not present

## 2024-07-14 DIAGNOSIS — Z953 Presence of xenogenic heart valve: Secondary | ICD-10-CM | POA: Diagnosis not present

## 2024-07-14 DIAGNOSIS — I2510 Atherosclerotic heart disease of native coronary artery without angina pectoris: Secondary | ICD-10-CM | POA: Diagnosis present

## 2024-07-14 DIAGNOSIS — Z79899 Other long term (current) drug therapy: Secondary | ICD-10-CM | POA: Diagnosis not present

## 2024-07-14 DIAGNOSIS — I10 Essential (primary) hypertension: Secondary | ICD-10-CM | POA: Diagnosis present

## 2024-07-14 DIAGNOSIS — E119 Type 2 diabetes mellitus without complications: Secondary | ICD-10-CM | POA: Diagnosis not present

## 2024-07-14 DIAGNOSIS — Z66 Do not resuscitate: Secondary | ICD-10-CM | POA: Diagnosis not present

## 2024-07-14 DIAGNOSIS — I509 Heart failure, unspecified: Secondary | ICD-10-CM | POA: Diagnosis not present

## 2024-07-14 DIAGNOSIS — R7989 Other specified abnormal findings of blood chemistry: Secondary | ICD-10-CM | POA: Diagnosis not present

## 2024-07-14 DIAGNOSIS — I5033 Acute on chronic diastolic (congestive) heart failure: Secondary | ICD-10-CM | POA: Diagnosis not present

## 2024-07-14 DIAGNOSIS — Z833 Family history of diabetes mellitus: Secondary | ICD-10-CM

## 2024-07-14 DIAGNOSIS — J984 Other disorders of lung: Secondary | ICD-10-CM | POA: Diagnosis not present

## 2024-07-14 DIAGNOSIS — E1122 Type 2 diabetes mellitus with diabetic chronic kidney disease: Secondary | ICD-10-CM | POA: Diagnosis not present

## 2024-07-14 DIAGNOSIS — R918 Other nonspecific abnormal finding of lung field: Secondary | ICD-10-CM | POA: Diagnosis not present

## 2024-07-14 DIAGNOSIS — E785 Hyperlipidemia, unspecified: Secondary | ICD-10-CM | POA: Diagnosis present

## 2024-07-14 DIAGNOSIS — E1169 Type 2 diabetes mellitus with other specified complication: Secondary | ICD-10-CM

## 2024-07-14 DIAGNOSIS — I110 Hypertensive heart disease with heart failure: Secondary | ICD-10-CM | POA: Diagnosis not present

## 2024-07-14 DIAGNOSIS — Z1152 Encounter for screening for COVID-19: Secondary | ICD-10-CM

## 2024-07-14 DIAGNOSIS — R0601 Orthopnea: Secondary | ICD-10-CM | POA: Diagnosis not present

## 2024-07-14 DIAGNOSIS — I130 Hypertensive heart and chronic kidney disease with heart failure and stage 1 through stage 4 chronic kidney disease, or unspecified chronic kidney disease: Secondary | ICD-10-CM | POA: Diagnosis not present

## 2024-07-14 LAB — RESP PANEL BY RT-PCR (RSV, FLU A&B, COVID)  RVPGX2
Influenza A by PCR: NEGATIVE
Influenza B by PCR: NEGATIVE
Resp Syncytial Virus by PCR: NEGATIVE
SARS Coronavirus 2 by RT PCR: NEGATIVE

## 2024-07-14 LAB — BASIC METABOLIC PANEL WITH GFR
Anion gap: 15 (ref 5–15)
BUN: 49 mg/dL — ABNORMAL HIGH (ref 8–23)
CO2: 21 mmol/L — ABNORMAL LOW (ref 22–32)
Calcium: 9.1 mg/dL (ref 8.9–10.3)
Chloride: 104 mmol/L (ref 98–111)
Creatinine, Ser: 1.88 mg/dL — ABNORMAL HIGH (ref 0.61–1.24)
GFR, Estimated: 34 mL/min — ABNORMAL LOW (ref >60)
Glucose, Bld: 178 mg/dL — ABNORMAL HIGH (ref 70–99)
Potassium: 4.2 mmol/L (ref 3.5–5.1)
Sodium: 141 mmol/L (ref 135–145)

## 2024-07-14 LAB — CBC
HCT: 33.5 % — ABNORMAL LOW (ref 39.0–52.0)
Hemoglobin: 11.2 g/dL — ABNORMAL LOW (ref 13.0–17.0)
MCH: 31.3 pg (ref 26.0–34.0)
MCHC: 33.4 g/dL (ref 30.0–36.0)
MCV: 93.6 fL (ref 80.0–100.0)
Platelets: 196 K/uL (ref 150–400)
RBC: 3.58 MIL/uL — ABNORMAL LOW (ref 4.22–5.81)
RDW: 14.3 % (ref 11.5–15.5)
WBC: 11.6 K/uL — ABNORMAL HIGH (ref 4.0–10.5)
nRBC: 0 % (ref 0.0–0.2)

## 2024-07-14 LAB — GLUCOSE, CAPILLARY: Glucose-Capillary: 218 mg/dL — ABNORMAL HIGH (ref 70–99)

## 2024-07-14 LAB — TROPONIN T, HIGH SENSITIVITY
Troponin T High Sensitivity: 73 ng/L — ABNORMAL HIGH (ref 0–19)
Troponin T High Sensitivity: 69 ng/L — ABNORMAL HIGH (ref 0–19)

## 2024-07-14 LAB — PRO BRAIN NATRIURETIC PEPTIDE: Pro Brain Natriuretic Peptide: 4291 pg/mL — ABNORMAL HIGH (ref <300.0)

## 2024-07-14 LAB — CBG MONITORING, ED: Glucose-Capillary: 164 mg/dL — ABNORMAL HIGH (ref 70–99)

## 2024-07-14 MED ORDER — ACETAMINOPHEN 325 MG PO TABS: 650.0000 mg | ORAL_TABLET | Freq: Four times a day (QID) | ORAL | Status: DC | PRN

## 2024-07-14 MED ORDER — INSULIN GLARGINE-YFGN 100 UNIT/ML ~~LOC~~ SOLN: 6.0000 [IU] | Freq: Every day | SUBCUTANEOUS | Status: DC

## 2024-07-14 MED ORDER — FUROSEMIDE 10 MG/ML IJ SOLN
40.0000 mg | Freq: Once | INTRAMUSCULAR | Status: AC
  Administered 2024-07-14: 40 mg via INTRAVENOUS
  Filled 2024-07-14: qty 4

## 2024-07-14 MED ORDER — APIXABAN 2.5 MG PO TABS: 2.5000 mg | ORAL_TABLET | Freq: Two times a day (BID) | ORAL | Status: DC

## 2024-07-14 MED ORDER — INSULIN ASPART 100 UNIT/ML IJ SOLN
0.0000 [IU] | Freq: Three times a day (TID) | INTRAMUSCULAR | Status: DC
  Administered 2024-07-14: 2 [IU] via SUBCUTANEOUS

## 2024-07-14 MED ORDER — SODIUM CHLORIDE 0.9% FLUSH
3.0000 mL | Freq: Two times a day (BID) | INTRAVENOUS | Status: DC
  Administered 2024-07-14 – 2024-07-15 (×2): 3 mL via INTRAVENOUS

## 2024-07-14 MED ORDER — SENNA 8.6 MG PO TABS
1.0000 | ORAL_TABLET | Freq: Every day | ORAL | Status: DC | PRN
  Filled 2024-07-14: qty 1

## 2024-07-14 MED ORDER — APIXABAN 2.5 MG PO TABS
2.5000 mg | ORAL_TABLET | Freq: Two times a day (BID) | ORAL | Status: DC
  Administered 2024-07-14 – 2024-07-19 (×11): 2.5 mg via ORAL
  Filled 2024-07-14 (×12): qty 1

## 2024-07-14 MED ORDER — ACETAMINOPHEN 650 MG RE SUPP: 650.0000 mg | Freq: Four times a day (QID) | RECTAL | Status: DC | PRN

## 2024-07-14 MED ORDER — LOSARTAN POTASSIUM 25 MG PO TABS: 50.0000 mg | ORAL_TABLET | Freq: Two times a day (BID) | ORAL | Status: DC

## 2024-07-14 MED ORDER — PROCHLORPERAZINE EDISYLATE 10 MG/2ML IJ SOLN: 5.0000 mg | Freq: Four times a day (QID) | INTRAMUSCULAR | Status: DC | PRN

## 2024-07-14 MED ORDER — LOSARTAN POTASSIUM 50 MG PO TABS
50.0000 mg | ORAL_TABLET | Freq: Two times a day (BID) | ORAL | Status: DC
  Administered 2024-07-14: 50 mg via ORAL
  Filled 2024-07-14 (×2): qty 1

## 2024-07-14 MED ORDER — GUAIFENESIN 100 MG/5ML PO LIQD
5.0000 mL | ORAL | Status: DC | PRN
  Administered 2024-07-17: 5 mL via ORAL
  Filled 2024-07-14 (×7): qty 5

## 2024-07-14 MED ORDER — AMIODARONE HCL 200 MG PO TABS: 200.0000 mg | ORAL_TABLET | Freq: Every day | ORAL | Status: DC

## 2024-07-14 MED ORDER — INSULIN GLARGINE 100 UNIT/ML ~~LOC~~ SOLN
6.0000 [IU] | Freq: Every day | SUBCUTANEOUS | Status: DC
  Administered 2024-07-14: 6 [IU] via SUBCUTANEOUS
  Filled 2024-07-14 (×3): qty 0.06

## 2024-07-14 MED ORDER — INSULIN ASPART 100 UNIT/ML IJ SOLN
0.0000 [IU] | Freq: Every day | INTRAMUSCULAR | Status: DC
  Administered 2024-07-14: 2 [IU] via SUBCUTANEOUS

## 2024-07-14 MED ORDER — FUROSEMIDE 10 MG/ML IJ SOLN
40.0000 mg | Freq: Two times a day (BID) | INTRAMUSCULAR | Status: DC
  Administered 2024-07-14 – 2024-07-15 (×2): 40 mg via INTRAVENOUS
  Filled 2024-07-14 (×4): qty 4

## 2024-07-14 MED ORDER — AMLODIPINE BESYLATE 10 MG PO TABS
10.0000 mg | ORAL_TABLET | Freq: Every day | ORAL | Status: DC
  Administered 2024-07-15 – 2024-07-19 (×5): 10 mg via ORAL
  Filled 2024-07-14 (×6): qty 1

## 2024-07-14 MED ORDER — AMLODIPINE BESYLATE 5 MG PO TABS: 10.0000 mg | ORAL_TABLET | Freq: Every day | ORAL | Status: DC

## 2024-07-14 MED ORDER — INSULIN ASPART 100 UNIT/ML IJ SOLN
0.0000 [IU] | Freq: Three times a day (TID) | INTRAMUSCULAR | Status: DC
  Administered 2024-07-15: 2 [IU] via SUBCUTANEOUS

## 2024-07-14 MED ORDER — ATORVASTATIN CALCIUM 40 MG PO TABS: 40.0000 mg | ORAL_TABLET | Freq: Every day | ORAL | Status: DC

## 2024-07-14 MED ORDER — AMIODARONE HCL 200 MG PO TABS
200.0000 mg | ORAL_TABLET | Freq: Every day | ORAL | Status: DC
  Administered 2024-07-15 – 2024-07-19 (×5): 200 mg via ORAL
  Filled 2024-07-14 (×6): qty 1

## 2024-07-14 MED ORDER — ATORVASTATIN CALCIUM 40 MG PO TABS
40.0000 mg | ORAL_TABLET | Freq: Every day | ORAL | Status: DC
  Administered 2024-07-14: 40 mg via ORAL
  Filled 2024-07-14 (×2): qty 1

## 2024-07-14 NOTE — Progress Notes (Incomplete)
 PROGRESS NOTE    Peter Scott  FMW:[id number] DOB: 1931-12-01 DOA: 07/14/2024 PCP: Watt Harlene BROCKS, MD  89 yo with hx HTN, CAD, CAD s/p CABG, AS s/p TAVR, atrial fibrillation on eliquis , T2DM and multiple other medical issues presenting with 5 days increased DOE, bilateral leg swelling, chest pressure. CXR concerning for possible edema (vs atelectasis) with minimal pleural effusions. Vitals notable for intermittent tachypnea, on 3 L [place] satting in mid 90's. Intermittent bradycardia. Afebrile. Labs notable for elevated creatinine 1.8 (appears at baseline), pro-bnp 4291,  troponin 73 , WBC 11, Notably he had echo 01/2024 with preserved EF (50-55%, no RWMA, moderately elevated PASP, bioprosthetic aortic valve   Subjective:  Assessment and Plan:  Acute on chronic diastolic CHF Pulm HTN -last ECHO 3/25 w/ EF 50-55%, mod elevated PASP  PAfib -amiodarone , apixaban   H/o TAVR  CAD CABG Elevated troponin  CKD 3b  Hyperglycemia   DVT prophylaxis: apixaban  Code Status:  Family Communication: Disposition Plan:   Consultants:    Procedures:   Antimicrobials:    Objective: Vitals:   07/14/24 1715 07/14/24 1730 07/14/24 1745 07/14/24 1800  BP: (!) 164/69 (!) 157/61 (!) 158/60   Pulse: (!) 57 (!) 53 (!) 51 (!) 56  Resp: (!) 23 (!) 21 20 (!) 25  Temp:    97.6 F (36.4 C)  TempSrc:    Oral  SpO2: 94% 94% 99% 96%  Weight:      Height:        Intake/Output Summary (Last 24 hours) at 07/14/2024 1921 Last data filed at 07/14/2024 1643 Gross per 24 hour  Intake 120 ml  Output 2000 ml  Net -1880 ml   Filed Weights   07/14/24 0945  Weight: 68.5 kg    Examination:      Data Reviewed:   CBC: Recent Labs  Lab 07/14/24 0951  WBC 11.6*  HGB 11.2*  HCT 33.5*  MCV 93.6  PLT 196   Basic Metabolic Panel: Recent Labs  Lab 07/14/24 0951  NA 141  K 4.2  CL 104  CO2 21*  GLUCOSE 178*  BUN 49*  CREATININE 1.88*  CALCIUM  9.1   GFR: Estimated Creatinine  Clearance: 25.3 mL/min (A) (by C-G formula based on SCr of 1.88 mg/dL (H)). Liver Function Tests: No results for input(s): AST, ALT, ALKPHOS, BILITOT, PROT, ALBUMIN  in the last 168 hours. No results for input(s): LIPASE, AMYLASE in the last 168 hours. No results for input(s): AMMONIA in the last 168 hours. Coagulation Profile: No results for input(s): INR, PROTIME in the last 168 hours. Cardiac Enzymes: No results for input(s): CKTOTAL, CKMB, CKMBINDEX, TROPONINI in the last 168 hours. BNP (last 3 results) Recent Labs    07/14/24 0951  PROBNP 4,291.0*   HbA1C: No results for input(s): HGBA1C in the last 72 hours. CBG: Recent Labs  Lab 07/14/24 1641  GLUCAP 164*   Lipid Profile: No results for input(s): CHOL, HDL, LDLCALC, TRIG, CHOLHDL, LDLDIRECT in the last 72 hours. Thyroid  Function Tests: No results for input(s): TSH, T4TOTAL, FREET4, T3FREE, THYROIDAB in the last 72 hours. Anemia Panel: No results for input(s): VITAMINB12, FOLATE, FERRITIN, TIBC, IRON, RETICCTPCT in the last 72 hours. Urine analysis:    Component Value Date/Time   COLORURINE YELLOW 03/28/2018 1034   APPEARANCEUR CLEAR 03/28/2018 1034   LABSPEC 1.019 03/28/2018 1034   PHURINE 5.0 03/28/2018 1034   GLUCOSEU 50 (A) 03/28/2018 1034   GLUCOSEU 100 (A) 10/10/2015 0821   HGBUR NEGATIVE 03/28/2018 1034   BILIRUBINUR  NEGATIVE 03/28/2018 1034   KETONESUR NEGATIVE 03/28/2018 1034   PROTEINUR NEGATIVE 03/28/2018 1034   UROBILINOGEN 0.2 10/10/2015 0821   NITRITE NEGATIVE 03/28/2018 1034   LEUKOCYTESUR NEGATIVE 03/28/2018 1034   Sepsis Labs: @LABRCNTIP (procalcitonin:4,lacticidven:4)  ) Recent Results (from the past 240 hours)  Resp panel by RT-PCR (RSV, Flu A&B, Covid) Anterior Nasal Swab     Status: None   Collection Time: 07/14/24 10:47 AM   Specimen: Anterior Nasal Swab  Result Value Ref Range Status   SARS Coronavirus 2 by RT PCR  NEGATIVE NEGATIVE Final    Comment: (NOTE) SARS-CoV-2 target nucleic acids are NOT DETECTED.  The SARS-CoV-2 RNA is generally detectable in upper respiratory specimens during the acute phase of infection. The lowest concentration of SARS-CoV-2 viral copies this assay can detect is 138 copies/mL. A negative result does not preclude SARS-Cov-2 infection and should not be used as the sole basis for treatment or other patient management decisions. A negative result may occur with  improper specimen collection/handling, submission of specimen other than nasopharyngeal swab, presence of viral mutation(s) within the areas targeted by this assay, and inadequate number of viral copies(<138 copies/mL). A negative result must be combined with clinical observations, patient history, and epidemiological information. The expected result is Negative.  Fact Sheet for Patients:  BloggerCourse.com  Fact Sheet for Healthcare Providers:  SeriousBroker.it  This test is no t yet approved or cleared by the [place]  FDA and  has been authorized for detection and/or diagnosis of SARS-CoV-2 by FDA under an Emergency Use Authorization (EUA). This EUA will remain  in effect (meaning this test can be used) for the duration of the COVID-19 declaration under Section 564(b)(1) of the Act, 21 U.S.C.section 360bbb-3(b)(1), unless the authorization is terminated  or revoked sooner.       Influenza A by PCR NEGATIVE NEGATIVE Final   Influenza B by PCR NEGATIVE NEGATIVE Final    Comment: (NOTE) The Xpert Xpress SARS-CoV-2/FLU/RSV plus assay is intended as an aid in the diagnosis of influenza from Nasopharyngeal swab specimens and should not be used as a sole basis for treatment. Nasal washings and aspirates are unacceptable for Xpert Xpress SARS-CoV-2/FLU/RSV testing.  Fact Sheet for Patients: BloggerCourse.com  Fact Sheet for  Healthcare Providers: SeriousBroker.it  This test is not yet approved or cleared by the [place]  FDA and has been authorized for detection and/or diagnosis of SARS-CoV-2 by FDA under an Emergency Use Authorization (EUA). This EUA will remain in effect (meaning this test can be used) for the duration of the COVID-19 declaration under Section 564(b)(1) of the Act, 21 U.S.C. section 360bbb-3(b)(1), unless the authorization is terminated or revoked.     Resp Syncytial Virus by PCR NEGATIVE NEGATIVE Final    Comment: (NOTE) Fact Sheet for Patients: BloggerCourse.com  Fact Sheet for Healthcare Providers: SeriousBroker.it  This test is not yet approved or cleared by the [place]  FDA and has been authorized for detection and/or diagnosis of SARS-CoV-2 by FDA under an Emergency Use Authorization (EUA). This EUA will remain in effect (meaning this test can be used) for the duration of the COVID-19 declaration under Section 564(b)(1) of the Act, 21 U.S.C. section 360bbb-3(b)(1), unless the authorization is terminated or revoked.  Performed at [facility], [address] [place], [place] [address]      Radiology Studies: DG Chest Portable 1 View Result Date: 07/14/2024 CLINICAL DATA:  Shortness of breath, cough. Lower extremity swelling. EXAM: PORTABLE CHEST 1 VIEW COMPARISON:  January 10, 2024. FINDINGS: Stable cardiomediastinal silhouette. Increased bibasilar interstitial densities concerning for possible edema or atelectasis. Minimal pleural effusions are noted. Status post transcatheter aortic valve repair. Sternotomy wires are noted. Bony thorax is unremarkable. IMPRESSION: Increased bibasilar interstitial densities concerning for possible edema or atelectasis. Minimal pleural effusions are noted. Electronically Signed   By: Lynwood Landy Raddle M.D.   On: 07/14/2024 11:21     Scheduled  Meds:  [START ON 07/15/2024] amiodarone   200 mg Oral Daily   [START ON 07/15/2024] amLODipine   10 mg Oral Daily   apixaban   2.5 mg Oral BID   [START ON 07/15/2024] atorvastatin   40 mg Oral Daily   insulin  aspart  0-9 Units Subcutaneous TID WC   losartan   50 mg Oral BID   Continuous Infusions:   LOS: 0 days    Time spent:    Sigurd Pac, MD [facility]   07/14/2024, 7:21 PM

## 2024-07-14 NOTE — ED Notes (Signed)
 [facility] called for hospitalist consult.

## 2024-07-14 NOTE — Discharge Instructions (Addendum)
 Please purchase a portable pulse oximeter for finger and  spot check oxygen levels and write them down for your provider to review.    Please see medication list below for information on food equivalents to support potassium levels,

## 2024-07-14 NOTE — ED Triage Notes (Signed)
 C/o bilateral leg swelling, intermittent chest pressure, increased dyspnea on exertion x 5 days. Productive cough.  Hx chf, taking lasix , CABGx4, aortic valve replacement.

## 2024-07-14 NOTE — ED Provider Notes (Signed)
  EMERGENCY DEPARTMENT AT [facility] Provider Note   CSN: [id number] Arrival date & time: 07/14/24  9061  Patient presents with: Shortness of Breath  Peter Scott is a 89 y.o. male.  -5 days of worsening SOB and chest heaviness -Worsened by exertion and laying down -Cough, productive of grey phlegm -Increased leg swelling and weight  -No fever, N/V/D  PMH CAD s/p CABG, TAVR Afib   Shortness of Breath Associated symptoms: cough     Prior to Admission medications   Medication Sig Start Date End Date Taking? Authorizing Provider  acetaminophen  (TYLENOL ) 500 MG tablet Take 500 mg by mouth every 6 (six) hours as needed for mild pain or moderate pain.    [provider]  amiodarone  (PACERONE ) 200 MG tablet Take 1 tablet (200 mg total) by mouth daily. 08/13/18   Pietro Redell RAMAN, MD  amLODipine  (NORVASC ) 10 MG tablet Take 10 mg by mouth daily.    [provider]  amoxicillin  (AMOXIL ) 500 MG capsule Take 4 capsules (2,000 mg total) by mouth one hour prior to dental procedure. 09/25/22     apixaban  (ELIQUIS ) 2.5 MG TABS tablet Take 1 tablet (2.5 mg total) by mouth 2 (two) times daily. 01/28/20   Pietro Redell RAMAN, MD  atorvastatin  (LIPITOR) 40 MG tablet Take 40 mg by mouth daily.    [provider]  Blood Glucose Monitoring Suppl (CONTOUR NEXT EZ) w/Device KIT 1 kit by Does not apply route daily. Use once a day to check blood sugar.  Dx code: E11.9 09/03/19   Copland, Harlene BROCKS, MD  Cholecalciferol 25 MCG (1000 UT) tablet  06/29/21   [provider]  COVID-19 mRNA bivalent vaccine, Moderna, (MODERNA COVID-19 BIVAL BOOSTER) 50 MCG/0.5ML injection Inject into the muscle. 08/18/21   Luiz Channel, MD  ferrous sulfate 325 (65 FE) MG tablet Take 325 mg by mouth daily with breakfast. Patient not taking: Reported on 03/18/2024    [provider]  furosemide  (LASIX ) 20 MG tablet Take 1 tablet (20 mg total) by mouth daily. May take  extra 20 mg daily as needed for weight gain 06/27/23   Pietro Redell RAMAN, MD  glucose blood (CONTOUR NEXT TEST) test strip Use once a day to check blood sugar.  Dx code: E11.9 02/08/21   Copland, Harlene BROCKS, MD  insulin  glargine-yfgn (SEMGLEE ) 100 UNIT/ML Pen 18 Units. 08/21/21   [provider]  Insulin  Pen Needle (BD PEN NEEDLE NANO U/F) 32G X 4 MM MISC by Does not apply route. 07/10/21   [provider]  losartan  (COZAAR ) 100 MG tablet Take 0.5 tablets (50 mg total) by mouth 2 (two) times daily. 08/05/18   Copland, Harlene BROCKS, MD    Allergies: Glipizide    Review of Systems  Respiratory:  Positive for cough and shortness of breath.   Cardiovascular:  Positive for leg swelling.    Updated Vital Signs BP (!) 152/71   Pulse 61   Temp 97.8 F (36.6 C)   Resp 17   Ht 5' 9 (1.753 m)   Wt 68.5 kg   SpO2 95%   BMI 22.30 kg/m   Physical Exam Constitutional:      General: He is not in acute distress. Cardiovascular:     Rate and Rhythm: Normal rate and regular rhythm.  Pulmonary:     Effort: Pulmonary effort is normal. No accessory muscle usage or respiratory distress.     Breath sounds: Normal breath sounds.  Comments: On 3 L O2.  Abdominal:     Palpations: Abdomen is soft.     Tenderness: There is no abdominal tenderness.  Musculoskeletal:     Right lower leg: Edema present.     Left lower leg: Edema present.  Skin:    General: Skin is warm and dry.  Neurological:     Mental Status: He is alert.     (all labs ordered are listed, but only abnormal results are displayed) Labs Reviewed  BASIC METABOLIC PANEL WITH GFR - Abnormal; Notable for the following components:      Result Value   CO2 21 (*)    Glucose, Bld 178 (*)    BUN 49 (*)    Creatinine, Ser 1.88 (*)    GFR, Estimated 34 (*)    All other components within normal limits  CBC - Abnormal; Notable for the following components:   WBC 11.6 (*)    RBC 3.58 (*)    Hemoglobin 11.2 (*)    HCT 33.5  (*)    All other components within normal limits  PRO BRAIN NATRIURETIC PEPTIDE - Abnormal; Notable for the following components:   Pro Brain Natriuretic Peptide 4,291.0 (*)    All other components within normal limits  TROPONIN T, HIGH SENSITIVITY - Abnormal; Notable for the following components:   Troponin T High Sensitivity 73 (*)    All other components within normal limits  TROPONIN T, HIGH SENSITIVITY - Abnormal; Notable for the following components:   Troponin T High Sensitivity 69 (*)    All other components within normal limits  RESP PANEL BY RT-PCR (RSV, FLU A&B, COVID)  [id number]    EKG: EKG Interpretation Date/Time:  Tuesday July 14 2024 09:47:23 EDT Ventricular Rate:  66 PR Interval:  253 QRS Duration:  115 QT Interval:  443 QTC Calculation: 465 R Axis:   -20  Text Interpretation: Sinus rhythm Prolonged PR interval Nonspecific intraventricular conduction delay Confirmed by Cottie Cough [phone]) on 07/14/2024 9:51:06 AM  Radiology: [facility] Chest Portable 1 View Result Date: 07/14/2024 CLINICAL DATA:  Shortness of breath, cough. Lower extremity swelling. EXAM: PORTABLE CHEST 1 VIEW COMPARISON:  January 10, 2024. FINDINGS: Stable cardiomediastinal silhouette. Increased bibasilar interstitial densities concerning for possible edema or atelectasis. Minimal pleural effusions are noted. Status post transcatheter aortic valve repair. Sternotomy wires are noted. Bony thorax is unremarkable. IMPRESSION: Increased bibasilar interstitial densities concerning for possible edema or atelectasis. Minimal pleural effusions are noted. Electronically Signed   By: Lynwood Landy Raddle M.D.   On: 07/14/2024 11:21     Procedures   Medications Ordered in the ED  insulin  aspart (novoLOG ) injection 0-9 Units (has no administration in time range)  amiodarone  (PACERONE ) tablet 200 mg (has no administration in time range)  amLODipine  (NORVASC ) tablet 10 mg (has no administration in time range)   atorvastatin  (LIPITOR) tablet 40 mg (has no administration in time range)  apixaban  (ELIQUIS ) tablet 2.5 mg (has no administration in time range)  losartan  (COZAAR ) tablet 50 mg (has no administration in time range)  furosemide  (LASIX ) injection 40 mg (40 mg Intravenous Given 07/14/24 1042)    Clinical Course as of 07/14/24 1304  Tue Jul 14, 2024  1132 This is a 89 year old gentleman with a history of aortic valve replacement on Eliquis , congestive heart failure, CABG and coronary disease, presented to ED with about 4 days of worsening dyspnea on exertion, also reporting orthopnea, leg swelling.  He takes very small dose Lasix  at home twice  daily.  On exam the patient has mild tachypnea, was borderline hypoxic requiring now 3 L nasal cannula.  He does have rhonchi of the lower lobes bilaterally as well as symmetrical edema of the lower extremities.  Clinically I suspect this consistent with congestive heart failure.  He is having very minimal to no chest discomfort, with a nonischemic EKG and initial troponin only mildly elevated at 73.  I suspect this is mild demand ischemia from heart failure and I doubt ACS.  I doubt PE.  Patient is compliant with Eliquis .  Remainder labs show chronic kidney disease a creatinine near baseline level 1.8.  No significant anemia.  COVID and flu test are negative.  X-ray per my review shows pulmonary edema pattern.  Plan to admit on IV diuretics for potential repeat echocardiogram and evaluation for heart failure.  Patient is stable at this time [MT]    Clinical Course User Index [MT] Trifan, Donnice PARAS, MD                                 Medical Decision Making Amount and/or Complexity of Data Reviewed Labs: ordered. Radiology: ordered.  Risk Prescription drug management. Decision regarding hospitalization.   NAZIER NEYHART is a 89 y.o. male is here with worsening dyspnea, orthopnea, extremity swelling.    Lab Tests:  CBC: WBC 11.6, Hgb 11.2  BMP: Cr 1.88  (around bl), K 4.2  BNP: 4291 Troponin: 73 > 69 Resp quad: Negative  EKG: No acute ischemic findings  Imaging Studies ordered:  CXR: Increased bibasilar interstitial densities concerning for possible edema or atelectasis. Minimal pleural effusions are noted.  Medicines ordered:  IV Lasix  40   Plan Presentation and workup most concerning for ADHF. Last echo March 2025 with LVEF 50-55%, moderately elevated PA systolic pressure, severe L atrial dilation, degenerative mitral valve, bioprosthetic aortic valve.   EKG today without acute ischemic findings. Elevated troponin likely 2/2 demand ischemia. CXR suggestive of pulmonary edema.   Patient received 40mg  IV lasix  with some improvement of symptoms. Discussed hospital admission for continued fluid management and heart failure assessment. Consulted hospital team for admission. Home meds ordered as appropriate while awaiting transfer.   Final diagnoses:  Acute on chronic congestive heart failure, unspecified heart failure type [facility])    ED Discharge Orders     None          Diona Perkins, MD 07/14/24 1432    Cottie Donnice PARAS, MD 07/14/24 1438

## 2024-07-14 NOTE — H&P (Signed)
 History and Physical    Peter Scott DOB: 1931-12-01 DOA: 07/14/2024  PCP: Watt Harlene BROCKS, MD   Patient coming from: Home   Chief Complaint: Worsening SOB, weight-gain, increased leg swelling, orthopnea, cough   HPI: Peter Scott is a 89 y.o. male with medical history significant for hypertension, type 2 diabetes mellitus, CAD status post CABG, history of TAVR, atrial fibrillation on Eliquis , chronic HFpEF, and pulmonary hypertension who presents with progressive SOB, orthopnea, swelling, and cough.   Patient reports that he began developing shortness of breath and increased swelling roughly a week ago and has had a lot of difficulty performing his usual activities since 07/09/2024 due to dyspnea.  He has had trouble sleeping due to orthopnea and cough.  He reports strict adherence with his medications.  [facility] ED Course: Upon arrival to the ED, patient is found to be afebrile and saturating 90% on room air while at rest with tachypnea, normal HR, and stable BP.  Labs are most notable for creatinine 1.88, troponin 73, and proBNP 4291.  Chest x-ray reveals bibasilar edema or atelectasis with minimal pleural effusions.  Patient was treated with IV Lasix  and transferred to [facility] for admission.  Review of Systems:  All other systems reviewed and apart from HPI, are negative.  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   26 mm Edwards Sapien 3 transcatheter heart valve placed via percutaneous right transfemoral approach    Umbilical hernia     Past Surgical History:  Procedure Laterality Date    ABLATION     Rhythm problem; rhythm unknown; [place]   CARDIOVERSION N/A 07/14/2018   Procedure: CARDIOVERSION;  Surgeon: Moose Leim DEL, MD;  Location: [facility] ENDOSCOPY;  Service: Cardiovascular;  Laterality: N/A;   CARDIOVERSION N/A 08/27/2018   Procedure: CARDIOVERSION;  Surgeon: Jeffrie Oneil BROCKS, MD;  Location: [facility] ENDOSCOPY;  Service: Cardiovascular;  Laterality: N/A;   CORONARY ARTERY BYPASS GRAFT  2001   EXPLORATION POST OPERATIVE OPEN HEART  2001, June   INTRAOPERATIVE TRANSTHORACIC ECHOCARDIOGRAM N/A 04/01/2018   Procedure: INTRAOPERATIVE TRANSTHORACIC ECHOCARDIOGRAM;  Surgeon: Verlin Lonni BIRCH, MD;  Location: [facility] OR;  Service: Open Heart Surgery;  Laterality: N/A;   RIGHT/LEFT HEART CATH AND CORONARY/GRAFT ANGIOGRAPHY N/A 01/31/2018   Procedure: RIGHT/LEFT HEART CATH AND CORONARY/GRAFT ANGIOGRAPHY;  Surgeon: Verlin Lonni BIRCH, MD;  Location: [facility] INVASIVE CV LAB;  Service: Cardiovascular;  Laterality: N/A;   TEE WITHOUT CARDIOVERSION N/A 01/17/2018   Procedure: TRANSESOPHAGEAL ECHOCARDIOGRAM (TEE);  Surgeon: Pietro Redell RAMAN, MD;  Location: [facility] ENDOSCOPY;  Service: Cardiovascular;  Laterality: N/A;   TEE WITHOUT CARDIOVERSION N/A 07/14/2018   Procedure: TRANSESOPHAGEAL ECHOCARDIOGRAM (TEE);  Surgeon: Moose Leim DEL, MD;  Location: [facility] ENDOSCOPY;  Service: Cardiovascular;  Laterality: N/A;   TONSILLECTOMY     TRANSCATHETER AORTIC VALVE REPLACEMENT, TRANSFEMORAL N/A 04/01/2018   Procedure: TRANSCATHETER AORTIC VALVE REPLACEMENT, TRANSFEMORAL;  Surgeon: Verlin Lonni BIRCH, MD;  Location: [facility] OR;  Service: Open Heart Surgery;  Laterality: N/A;   UMBILICAL HERNIA REPAIR  2015  Social History:   reports that he has quit smoking. His smoking use included cigarettes. He has a 3 pack-year smoking history. He has never used smokeless tobacco. He reports that he does not drink alcohol and does not use drugs.  Allergies  Allergen Reactions   Glipizide Palpitations    Heart  racing Heart racing    Family History  Problem Relation Age of Onset   Diabetes Mother 57       Deceased   Heart disease Father 41       Deceased   Heart disease Maternal Uncle    Diabetes Maternal Uncle    Heart disease Brother    Diabetes Brother    Diabetes Sister        #1   Heart disease Sister        #2   Hyperlipidemia Son        x2     Prior to Admission medications   Medication Sig Start Date End Date Taking? Authorizing Provider  acetaminophen  (TYLENOL ) 500 MG tablet Take 500 mg by mouth every 6 (six) hours as needed for mild pain or moderate pain.    [provider]  amiodarone  (PACERONE ) 200 MG tablet Take 1 tablet (200 mg total) by mouth daily. 08/13/18   Pietro Redell RAMAN, MD  amLODipine  (NORVASC ) 10 MG tablet Take 10 mg by mouth daily.    [provider]  amoxicillin  (AMOXIL ) 500 MG capsule Take 4 capsules (2,000 mg total) by mouth one hour prior to dental procedure. 09/25/22     apixaban  (ELIQUIS ) 2.5 MG TABS tablet Take 1 tablet (2.5 mg total) by mouth 2 (two) times daily. 01/28/20   Pietro Redell RAMAN, MD  atorvastatin  (LIPITOR) 40 MG tablet Take 40 mg by mouth daily.    [provider]  Blood Glucose Monitoring Suppl (CONTOUR NEXT EZ) w/Device KIT 1 kit by Does not apply route daily. Use once a day to check blood sugar.  Dx code: E11.9 09/03/19   Copland, Harlene BROCKS, MD  Cholecalciferol 25 MCG (1000 UT) tablet  06/29/21   [provider]  COVID-19 mRNA bivalent vaccine, Moderna, (MODERNA COVID-19 BIVAL BOOSTER) 50 MCG/0.5ML injection Inject into the muscle. 08/18/21   Luiz Channel, MD  ferrous sulfate 325 (65 FE) MG tablet Take 325 mg by mouth daily with breakfast. Patient not taking: Reported on 03/18/2024    [provider]  furosemide  (LASIX ) 20 MG tablet Take 1 tablet (20 mg total) by mouth daily. May take extra 20 mg daily as needed for weight gain 06/27/23   Pietro Redell RAMAN, MD  glucose blood (CONTOUR NEXT TEST)  test strip Use once a day to check blood sugar.  Dx code: E11.9 02/08/21   Copland, Harlene BROCKS, MD  insulin  glargine-yfgn (SEMGLEE ) 100 UNIT/ML Pen 18 Units. 08/21/21   [provider]  Insulin  Pen Needle (BD PEN NEEDLE NANO U/F) 32G X 4 MM MISC by Does not apply route. 07/10/21   [provider]  losartan  (COZAAR ) 100 MG tablet Take 0.5 tablets (50 mg total) by mouth 2 (two) times daily. 08/05/18   Watt Harlene BROCKS, MD    Physical Exam: Vitals:   07/14/24 1730 07/14/24 1745 07/14/24 1800 07/14/24 1942  BP: (!) 157/61 (!) 158/60  (!) 152/49  Pulse: (!) 53 (!) 51 (!) 56 (!) 56  Resp: (!) 21 20 (!) 25 17  Temp:   97.6 F (36.4 C) 98.7 F (37.1 C)  TempSrc:   Oral Oral  SpO2: 94% 99% 96% 96%  Weight:    69.6 kg  Height:    5' 8 (1.727 m)    Constitutional: NAD, calm  Eyes: PERTLA, lids and conjunctivae normal ENMT: Mucous membranes are moist. Posterior pharynx clear of any exudate or lesions.   Neck: supple, no masses  Respiratory: Speaking in full sentences. Fine rales b/l.   Cardiovascular: S1 & S2 heard, regular rate and rhythm. Pretibial pitting edema.  Abdomen: No distension, no tenderness, soft. Bowel sounds active.  Musculoskeletal: no clubbing / cyanosis. No joint deformity upper and lower extremities.   Skin: no significant rashes, lesions, ulcers. Warm, dry, well-perfused. Neurologic: CN 2-12 grossly intact. Moving all extremities. Alert and oriented.  Psychiatric: Pleasant. Cooperative.    Labs and Imaging on Admission: I have personally reviewed following labs and imaging studies  CBC: Recent Labs  Lab 07/14/24 0951  WBC 11.6*  HGB 11.2*  HCT 33.5*  MCV 93.6  PLT 196   Basic Metabolic Panel: Recent Labs  Lab 07/14/24 0951  NA 141  K 4.2  CL 104  CO2 21*  GLUCOSE 178*  BUN 49*  CREATININE 1.88*  CALCIUM  9.1   GFR: Estimated Creatinine Clearance: 25.3 mL/min (A) (by C-G formula based on SCr of 1.88 mg/dL (H)). Liver Function  Tests: No results for input(s): AST, ALT, ALKPHOS, BILITOT, PROT, ALBUMIN  in the last 168 hours. No results for input(s): LIPASE, AMYLASE in the last 168 hours. No results for input(s): AMMONIA in the last 168 hours. Coagulation Profile: No results for input(s): INR, PROTIME in the last 168 hours. Cardiac Enzymes: No results for input(s): CKTOTAL, CKMB, CKMBINDEX, TROPONINI in the last 168 hours. BNP (last 3 results) Recent Labs    07/14/24 0951  PROBNP 4,291.0*   HbA1C: No results for input(s): HGBA1C in the last 72 hours. CBG: Recent Labs  Lab 07/14/24 1641  GLUCAP 164*   Lipid Profile: No results for input(s): CHOL, HDL, LDLCALC, TRIG, CHOLHDL, LDLDIRECT in the last 72 hours. Thyroid  Function Tests: No results for input(s): TSH, T4TOTAL, FREET4, T3FREE, THYROIDAB in the last 72 hours. Anemia Panel: No results for input(s): VITAMINB12, FOLATE, FERRITIN, TIBC, IRON, RETICCTPCT in the last 72 hours. Urine analysis:    Component Value Date/Time   COLORURINE YELLOW 03/28/2018 1034   APPEARANCEUR CLEAR 03/28/2018 1034   LABSPEC 1.019 03/28/2018 1034   PHURINE 5.0 03/28/2018 1034   GLUCOSEU 50 (A) 03/28/2018 1034   GLUCOSEU 100 (A) 10/10/2015 0821   HGBUR NEGATIVE 03/28/2018 1034   BILIRUBINUR NEGATIVE 03/28/2018 1034   KETONESUR NEGATIVE 03/28/2018 1034   PROTEINUR NEGATIVE 03/28/2018 1034   UROBILINOGEN 0.2 10/10/2015 0821   NITRITE NEGATIVE 03/28/2018 1034   LEUKOCYTESUR NEGATIVE 03/28/2018 1034   Sepsis Labs: @LABRCNTIP (procalcitonin:4,lacticidven:4) ) Recent Results (from the past 240 hours)  Resp panel by RT-PCR (RSV, Flu A&B, Covid) Anterior Nasal Swab     Status: None   Collection Time: 07/14/24 10:47 AM   Specimen: Anterior Nasal Swab  Result Value Ref Range Status   SARS Coronavirus 2 by RT PCR NEGATIVE NEGATIVE Final    Comment: (NOTE) SARS-CoV-2 target nucleic acids are NOT  DETECTED.  The SARS-CoV-2 RNA is generally detectable in upper respiratory specimens during the acute phase of infection. The lowest concentration of SARS-CoV-2 viral copies this assay can detect is 138 copies/mL. A negative result does not preclude SARS-Cov-2 infection and should not be used as the sole basis for treatment or other patient management decisions. A negative result may occur with  improper specimen collection/handling, submission of specimen other than nasopharyngeal swab, presence of viral mutation(s) within the areas targeted by this assay, and inadequate number of viral copies(<138 copies/mL). A negative result must be combined with clinical observations, patient history, and epidemiological information. The expected result is Negative.  Fact Sheet for Patients:  BloggerCourse.com  Fact Sheet for Healthcare Providers:  SeriousBroker.it  This test is no t yet approved or cleared by the [place]  FDA and  has been authorized for detection and/or diagnosis of SARS-CoV-2 by FDA under an Emergency Use Authorization (EUA). This EUA will remain  in effect (meaning this test can be used) for the duration of the COVID-19 declaration under Section 564(b)(1) of the Act, 21 U.S.C.section 360bbb-3(b)(1), unless the authorization is terminated  or revoked sooner.       Influenza A by PCR NEGATIVE NEGATIVE Final   Influenza B by PCR NEGATIVE NEGATIVE Final    Comment: (NOTE) The Xpert Xpress SARS-CoV-2/FLU/RSV plus assay is intended as an aid in the diagnosis of influenza from Nasopharyngeal swab specimens and should not be used as a sole basis for treatment. Nasal washings and aspirates are unacceptable for Xpert Xpress SARS-CoV-2/FLU/RSV testing.  Fact Sheet for Patients: BloggerCourse.com  Fact Sheet for Healthcare Providers: SeriousBroker.it  This test is not yet  approved or cleared by the [place]  FDA and has been authorized for detection and/or diagnosis of SARS-CoV-2 by FDA under an Emergency Use Authorization (EUA). This EUA will remain in effect (meaning this test can be used) for the duration of the COVID-19 declaration under Section 564(b)(1) of the Act, 21 U.S.C. section 360bbb-3(b)(1), unless the authorization is terminated or revoked.     Resp Syncytial Virus by PCR NEGATIVE NEGATIVE Final    Comment: (NOTE) Fact Sheet for Patients: BloggerCourse.com  Fact Sheet for Healthcare Providers: SeriousBroker.it  This test is not yet approved or cleared by the [place]  FDA and has been authorized for detection and/or diagnosis of SARS-CoV-2 by FDA under an Emergency Use Authorization (EUA). This EUA will remain in effect (meaning this test can be used) for the duration of the COVID-19 declaration under Section 564(b)(1) of the Act, 21 U.S.C. section 360bbb-3(b)(1), unless the authorization is terminated or revoked.  Performed at [facility], [address] [place], [place] [address]      Radiological Exams on Admission: DG Chest Portable 1 View Result Date: 07/14/2024 CLINICAL DATA:  Shortness of breath, cough. Lower extremity swelling. EXAM: PORTABLE CHEST 1 VIEW COMPARISON:  January 10, 2024. FINDINGS: Stable cardiomediastinal silhouette. Increased bibasilar interstitial densities concerning for possible edema or atelectasis. Minimal pleural effusions are noted. Status post transcatheter aortic valve repair. Sternotomy wires are noted. Bony thorax is unremarkable. IMPRESSION: Increased bibasilar interstitial densities concerning for possible edema or atelectasis. Minimal pleural effusions are noted. Electronically Signed   By: Lynwood Landy Raddle M.D.   On: 07/14/2024 11:21    EKG: Independently reviewed. Sinus rhythm, 1st degree AV block, non-specific IVCD.    Assessment/Plan   1. Acute on chronic HFpEF; pulmonary HTN; acute hypoxic respiratory failure  - EF was 50-55% with moderately elevated pulmonary artery systolic pressure on TTE from March 2025  - Continue diuresis with IV Lasix , continue ARB, monitor weight and I/Os, monitor renal function and electrolytes, and continue supplemental O2 as-needed    2. PAF  - Amiodarone , Eliquis     3. CAD  - Troponin is mildly elevated and flat, likely reflecting demand ischemia in setting of acute CHF  with hypoxia  - Continue Lipitor   4. CKD 3B  - Appears close to baseline  - Renally-dose medications, monitor closely while diuresing    5. Type II DM  - A1c was 7.2% in August 2025  - Check CBGs, continue basal insulin , add low-intensity SSI for now    6. HTN  - Norvasc , losartan     DVT prophylaxis: Eliquis   Code Status: DNR  Level of Care: Level of care: Telemetry Cardiac Family Communication: None present  Disposition Plan:  Patient is from: Home   Anticipated d/c is to: TBD Anticipated d/c date is: 07/17/24  Patient currently: Pending improved volume status ad oxygenation Consults called: None  Admission status: Inpatient     Evalene GORMAN Sprinkles, MD [facility]  07/14/2024, 8:40 PM

## 2024-07-14 NOTE — Treatment Plan (Addendum)
 [facility] to [facility] Hospitalist [facility]  89 yo with hx HTN, CAD, CAD s/p CABG, AS s/p TAVR, atrial fibrillation on eliquis , T2DM and multiple other medical issues presenting with 5 days increased DOE, bilateral leg swelling, chest pressure.  CXR concerning for possible edema (vs atelectasis) with minimal pleural effusions.  Vitals notable for intermittent tachypnea, on 3 L [place] satting in mid 90's.  Intermittent bradycardia.  Afebrile.  Labs notable for elevated creatinine (appears at baseline), elevated pro BNP (unclear baseline), elevated troponin, mild leukocytosis, mild anemia, hyperglycemia.  Notably he had echo 01/2024 with preserved EF (50-55%, no RWMA, moderately elevated PASP, bioprosthetic aortic valve.  Dr. Diona requesting admission for HF exacerbation.    Will admit to cardiac tele.  Requested ED to follow pending troponin, if rising consider cards consult.  Addendum: repeat troponin stable, per EDP reeval, some mild improvement in symptoms after lasix .

## 2024-07-15 DIAGNOSIS — I5033 Acute on chronic diastolic (congestive) heart failure: Secondary | ICD-10-CM | POA: Diagnosis not present

## 2024-07-15 LAB — GLUCOSE, CAPILLARY
Glucose-Capillary: 117 mg/dL — ABNORMAL HIGH (ref 70–99)
Glucose-Capillary: 194 mg/dL — ABNORMAL HIGH (ref 70–99)
Glucose-Capillary: 203 mg/dL — ABNORMAL HIGH (ref 70–99)

## 2024-07-15 LAB — BASIC METABOLIC PANEL WITH GFR
Anion gap: 11 (ref 5–15)
BUN: 45 mg/dL — ABNORMAL HIGH (ref 8–23)
CO2: 24 mmol/L (ref 22–32)
Calcium: 8.4 mg/dL — ABNORMAL LOW (ref 8.9–10.3)
Chloride: 104 mmol/L (ref 98–111)
Creatinine, Ser: 1.83 mg/dL — ABNORMAL HIGH (ref 0.61–1.24)
GFR, Estimated: 35 mL/min — ABNORMAL LOW (ref >60)
Glucose, Bld: 153 mg/dL — ABNORMAL HIGH (ref 70–99)
Potassium: 3.4 mmol/L — ABNORMAL LOW (ref 3.5–5.1)
Sodium: 139 mmol/L (ref 135–145)

## 2024-07-15 LAB — CBC
HCT: 30.1 % — ABNORMAL LOW (ref 39.0–52.0)
Hemoglobin: 10 g/dL — ABNORMAL LOW (ref 13.0–17.0)
MCH: 31.3 pg (ref 26.0–34.0)
MCHC: 33.2 g/dL (ref 30.0–36.0)
MCV: 94.1 fL (ref 80.0–100.0)
Platelets: 193 K/uL (ref 150–400)
RBC: 3.2 MIL/uL — ABNORMAL LOW (ref 4.22–5.81)
RDW: 14.1 % (ref 11.5–15.5)
WBC: 8.6 K/uL (ref 4.0–10.5)
nRBC: 0 % (ref 0.0–0.2)

## 2024-07-15 LAB — MAGNESIUM: Magnesium: 2 mg/dL (ref 1.7–2.4)

## 2024-07-15 MED ORDER — POTASSIUM CHLORIDE CRYS ER 20 MEQ PO TBCR
40.0000 meq | EXTENDED_RELEASE_TABLET | Freq: Every day | ORAL | Status: DC
  Administered 2024-07-15 – 2024-07-19 (×5): 40 meq via ORAL
  Filled 2024-07-15 (×8): qty 2

## 2024-07-15 MED ORDER — FUROSEMIDE 10 MG/ML IJ SOLN
40.0000 mg | Freq: Two times a day (BID) | INTRAMUSCULAR | Status: DC
  Administered 2024-07-15 – 2024-07-18 (×6): 40 mg via INTRAVENOUS
  Filled 2024-07-15 (×6): qty 4

## 2024-07-15 MED ORDER — EMPAGLIFLOZIN 10 MG PO TABS: 10.0000 mg | ORAL_TABLET | Freq: Every day | ORAL | Status: DC

## 2024-07-15 MED ORDER — INSULIN GLARGINE 100 UNIT/ML ~~LOC~~ SOLN
6.0000 [IU] | Freq: Every day | SUBCUTANEOUS | Status: DC
  Administered 2024-07-15 – 2024-07-18 (×4): 6 [IU] via SUBCUTANEOUS
  Filled 2024-07-15 (×5): qty 0.06

## 2024-07-15 MED ORDER — POTASSIUM CHLORIDE CRYS ER 20 MEQ PO TBCR
40.0000 meq | EXTENDED_RELEASE_TABLET | Freq: Once | ORAL | Status: AC
  Administered 2024-07-15: 40 meq via ORAL
  Filled 2024-07-15: qty 2

## 2024-07-15 MED ORDER — ATORVASTATIN CALCIUM 40 MG PO TABS
40.0000 mg | ORAL_TABLET | Freq: Every day | ORAL | Status: DC
  Administered 2024-07-15 – 2024-07-18 (×4): 40 mg via ORAL
  Filled 2024-07-15 (×4): qty 1

## 2024-07-15 MED ORDER — LOSARTAN POTASSIUM 25 MG PO TABS
25.0000 mg | ORAL_TABLET | Freq: Every day | ORAL | Status: DC
  Filled 2024-07-15 (×2): qty 1

## 2024-07-15 NOTE — Progress Notes (Signed)
 Physical Therapy Quick Note  PT has completed initial evaluation.    Overall, patient at mod I assistance level.   PT Follow up recommended: No follow up PT Equipment recommended:  None recommended Complete evaluation note to follow.     Arlyss Gandy, PT, DPT Acute Rehabilitation Office [phone]

## 2024-07-15 NOTE — Progress Notes (Signed)
 Video visit completed with patient and RN Marolyn. Skin assessment done and all bedtime medications were administered with assistance from the RN in the home. Patient stable and states he'll probably head to bed around 9:00 pm. RN reminded patient to be cautious when getting up during the night to use bathroom with his oxygen tubing. Patient declines a urinal at this time.

## 2024-07-15 NOTE — Progress Notes (Signed)
 SATURATION QUALIFICATIONS: (This note is used to comply with regulatory documentation for home oxygen)  Patient Saturations on Room Air at Rest = 85%  Patient Saturations on Room Air while Ambulating = 75%  Patient Saturations on 2 Liters of oxygen while Ambulating = 92%  Please briefly explain why patient needs home oxygen: To reduce symptoms of dyspnea and maintain oxygen saturation during household and community mobility.

## 2024-07-15 NOTE — Evaluation (Signed)
 Physical Therapy Evaluation Patient Details Name: Peter Scott MRN: [id number] DOB: 1932-05-28 Today's Date: 07/15/2024  History of Present Illness  89 y.o. male presents to [facility] hospital on 07/14/2024 with DOE, BLE swelling and chest pressure. Pt admitted for acute on chronic diastolic CHF and pulmonary HTN. PMH includes HTN, CABT, TAVR, afib, DMII.  Clinical Impression  Pt presents to PT with deficits in activity tolerance and cardiopulmonary function.  Pt is mobilizing independently but does require supplemental oxygen to maintain saturation. Pt will benefit from continued frequent mobilization in an effort to improve activity tolerance. PT recommends discharge home with no PT follow-up needs anticipated.      If plan is discharge home, recommend the following:     Can travel by private vehicle        Equipment Recommendations None recommended by PT  Recommendations for Other Services       Functional Status Assessment Patient has had a recent decline in their functional status and demonstrates the ability to make significant improvements in function in a reasonable and predictable amount of time.     Precautions / Restrictions Precautions Precautions: Other (comment) (monitor SPO2) Recall of Precautions/Restrictions: Intact Restrictions Weight Bearing Restrictions Per Provider Order: No      Mobility  Bed Mobility Overal bed mobility: Independent                  Transfers Overall transfer level: Independent                      Ambulation/Gait Ambulation/Gait assistance: Modified independent (Device/Increase time) Gait Distance (Feet): 250 Feet Assistive device: None Gait Pattern/deviations: WFL(Within Functional Limits) Gait velocity: functional Gait velocity interpretation: >2.62 ft/sec, indicative of community ambulatory      Stairs            Wheelchair Mobility     Tilt Bed    Modified Rankin (Stroke Patients Only)        Balance Overall balance assessment: Needs assistance Sitting-balance support: No upper extremity supported, Feet supported Sitting balance-Leahy Scale: Good     Standing balance support: No upper extremity supported, During functional activity Standing balance-Leahy Scale: Good                               Pertinent Vitals/Pain Pain Assessment Pain Assessment: No/denies pain    Home Living Family/patient expects to be discharged to:: Private residence Living Arrangements: Spouse/significant other Available Help at Discharge: Family;Available 24 hours/day (son and DIL available PRN) Type of Home: House Home Access: Stairs to enter Entrance Stairs-Rails: Can reach both Entrance Stairs-Number of Steps: 3   Home Layout: One level Home Equipment: Cane - single point      Prior Function Prior Level of Function : Independent/Modified Independent             Mobility Comments: ambulatory without DME, 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       Extremity/Trunk Assessment   Upper Extremity Assessment Upper Extremity Assessment: Overall WFL for tasks assessed    Lower Extremity Assessment Lower Extremity Assessment: Generalized weakness    Cervical / Trunk Assessment Cervical / Trunk Assessment: Kyphotic  Communication   Communication Communication: No apparent difficulties    Cognition Arousal: Alert Behavior During Therapy: WFL for tasks assessed/performed   PT - Cognitive impairments: No apparent impairments  Following commands: Intact       Cueing Cueing Techniques: Verbal cues     General Comments General comments (skin integrity, edema, etc.): pt on 1L [place] upon PT arrival, attempted to wean to room air with desaturation when dressing ot mid 70s. When ambulating on 1L the pt desats to mid 80s. 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    Frequency Min 1X/week     Co-evaluation               AM-PAC PT 6 Clicks Mobility  Outcome Measure Help needed turning from your back to your side while in a flat bed without using bedrails?: None Help needed moving from lying on your back to sitting on the side of a flat bed without using bedrails?: None Help needed moving to and from a bed to a chair (including a wheelchair)?: None Help needed standing up from a chair using your arms (e.g., wheelchair or bedside chair)?: None Help needed to walk in hospital room?: None Help needed climbing 3-5 steps with a railing? : A Little 6 Click Score: 23    End of Session Equipment Utilized During Treatment: Oxygen Activity Tolerance: Patient limited by fatigue Patient left: in bed;with call bell/phone within reach;with bed alarm set Nurse Communication: Mobility status PT Visit Diagnosis: Other abnormalities of gait and mobility (R26.89);Muscle weakness (generalized) (M62.81)    Time: 1440-1456 PT Time Calculation (min) (ACUTE ONLY): 16 min   Charges:   PT Evaluation $PT Eval Low Complexity: 1 Low   PT General Charges $$ ACUTE PT VISIT: 1 Visit         Bernardino JINNY Ruth, PT, DPT Acute  Rehabilitation Office [phone]   Bernardino JINNY Ruth 07/15/2024, 3:16 PM

## 2024-07-15 NOTE — Progress Notes (Signed)
 Video call completed with patient and EMT Mone'. Patient appears stable and denies any pain currently. He states he does get a little short of breath at times, but overall he feels better than he did yesterday. Reviewed with patient the different methods to get in contact with the RN. Patient verbalizes understanding and all questions were answered.

## 2024-07-15 NOTE — Plan of Care (Signed)
  Problem: Education: Goal: Knowledge of General Education information will improve Description: Including pain rating scale, medication(s)/side effects and non-pharmacologic comfort measures Outcome: Progressing   Problem: Health Behavior/Discharge Planning: Goal: Ability to manage health-related needs will improve Outcome: Progressing   Problem: Clinical Measurements: Goal: Ability to maintain clinical measurements within normal limits will improve Outcome: Progressing Goal: Respiratory complications will improve Outcome: Progressing Goal: Cardiovascular complication will be avoided Outcome: Progressing   Problem: Nutrition: Goal: Adequate nutrition will be maintained Outcome: Progressing   Problem: Elimination: Goal: Will not experience complications related to bowel motility Outcome: Progressing Goal: Will not experience complications related to urinary retention Outcome: Progressing   Problem: Pain Managment: Goal: General experience of comfort will improve and/or be controlled Outcome: Progressing

## 2024-07-15 NOTE — Plan of Care (Signed)
 [facility] at [facility] Plan of Care/Progress Note    Peter Scott   FMW:[id number]  DOB: 1932-01-29  DOA: 07/14/2024     1 Date of Service: 07/15/2024     Subjective:  89 yo with hx HTN, CAD, CAD s/p CABG, AS s/p TAVR, atrial fibrillation on eliquis , T2DM and multiple other medical issues presenting with 5 days increased DOE, bilateral leg swelling, chest pressure. CXR concerning for possible edema (vs atelectasis) with minimal pleural effusions. Pt noted not to require O2 prior to presentation. Followed by Dr. Pietro outpatient. No reported recent medication changes. At baseline, pt works on a farm daily. Performs all of his ADLs independently.   In the ED he was noted to be hypoxic, labs notable for elevated creatinine 1.8 (appears at baseline), pro-bnp 4291,  troponin 73 , WBC 11. CXR was concerning for volume overload.    At present, patient feels overall symptomatically mildly improved. Still requiring 1-2L [place]. O2 sats desatted to 75% while ambulating with PT.  Hospital Problems Assessment and Plan:   Acute on chronic diastolic CHF Pulm HTN -last ECHO 3/25 w/ EF 50-55%, mod elevated PASP - Improving with diuresis(-5,185 since admission)  -continue IV Lasix  40 mg twice daily -Hold ARB - Reports intolerance to SGLT2i - Compliant with diuretics and diet at baseline  Acute Respiratory Failure with hypoxia New O2 requirement with 2LNC in setting of acute on chronic HFpEF  -CXR w/ noted volume overload and no overt signs of infection  -O2 sats to 75% on RA while ambulating with PT today  -Will continue 2L [place]  -Titrate O2 with diuresis  -Monitor    PAfib - Currently in sinus rhythm, continue amiodarone , apixaban    H/o TAVR   CAD CABG Noted troponin 70s-->60s on presentation  No active chest pain  EKG stable  continue atorvastatin  and apixaban    CKD 3b -Cr 1.8 with GFR in the 30s  -Near baseline  - Stable, monitor with diuresis   Type 2 diabetes mellitus - A1c was 7.2  in August, continue basal insulin  with lantus  6 units  -BID blood sugar checks while in Hospital at Home program  -titrate long acting insulin  as appropriate         Objective Vital signs were reviewed and unremarkable. Vitals:   07/15/24 0826 07/15/24 1057 07/15/24 1058 07/15/24 1232  BP: (!) 148/57   (!) 145/51  Pulse: (!) 57 (!) 56  (!) 56  Temp: 98.1 F (36.7 C)   98 F (36.7 C)  Resp: 14 (!) 22  19  Height:      Weight:      SpO2: 95% (!) 86% 92% 91%  TempSrc: Oral   Oral  BMI (Calculated):       SABRA Exam Physical Exam Constitutional:      Appearance: He is normal weight.  HENT:     Nose: Nose normal.     Mouth/Throat:     Mouth: Mucous membranes are moist.  Eyes:     Pupils: Pupils are equal, round, and reactive to light.  Cardiovascular:     Rate and Rhythm: Normal rate and regular rhythm.  Pulmonary:     Effort: Pulmonary effort is normal.  Abdominal:     General: Bowel sounds are normal.  Musculoskeletal:     Right lower leg: Edema present.     Left lower leg: Edema present.  Skin:    General: Skin is warm.  Psychiatric:        Mood and  Affect: Mood normal.     Labs / Other Information There are no new results to review at this time. DG Chest Portable 1 View CLINICAL DATA:  Shortness of breath, cough. Lower extremity swelling.  EXAM: PORTABLE CHEST 1 VIEW  COMPARISON:  January 10, 2024.  FINDINGS: Stable cardiomediastinal silhouette. Increased bibasilar interstitial densities concerning for possible edema or atelectasis. Minimal pleural effusions are noted. Status post transcatheter aortic valve repair. Sternotomy wires are noted. Bony thorax is unremarkable.  IMPRESSION: Increased bibasilar interstitial densities concerning for possible edema or atelectasis. Minimal pleural effusions are noted.  Electronically Signed   By: Lynwood Landy Raddle M.D.   On: 07/14/2024 11:21  Lab Results  Component Value Date   WBC 8.6 07/15/2024   HGB  10.0 (L) 07/15/2024   HCT 30.1 (L) 07/15/2024   MCV 94.1 07/15/2024   PLT 193 07/15/2024    Last metabolic panel Lab Results  Component Value Date   GLUCOSE 153 (H) 07/15/2024   NA 139 07/15/2024   K 3.4 (L) 07/15/2024   CL 104 07/15/2024   CO2 24 07/15/2024   BUN 45 (H) 07/15/2024   CREATININE 1.83 (H) 07/15/2024   GFRNONAA 35 (L) 07/15/2024   CALCIUM  8.4 (L) 07/15/2024   PROT 6.3 01/10/2024   ALBUMIN  3.9 01/10/2024   LABGLOB 2.4 01/10/2024   AGRATIO 1.5 08/24/2020   BILITOT 0.5 01/10/2024   ALKPHOS 73 01/10/2024   AST 22 01/10/2024   ALT 28 01/10/2024   ANIONGAP 11 07/15/2024     Hospital at Home Admission Criteria Checklist:  Formal consent explained in detail and signed at the bedside:Yes  Patient meets inpatient admission criteria (see below for further details)  Is pt Medicare FFS/[employer] Medicare-Medicaid, [employer], [employer] ( required for initial launch with plan to expand)? yes Lives within 25 mil/ 30 min from [facility] within [place](pt may stay with family member during admission who lives within 25 miles or 30 min from [facility] w/in [facility]) ? yes  Hemodynamically stable with relatively low risk of clinical deterioration-not requiring ICU? yes Age >55?yes Does not require frequent touch-points or complex interventions/medications (ie Titrated Infusions (IV insulin , heparin  drips, vasoactive drips, use of infused or injectable controlled substances, patients on insulin )?  On long acting insulin   Any Behavioral Health comorbidities likely to increase risk for in-home care (ie Acute delirium or experiencing a marked altered mental status and cause is not a treatable condition in the home)? no  Has the patient been on BIPAP during course of ED evaluation or hospitalization?No  IF YES, Has the patient been off of BIPAP for >24 hours(If NO-THEN PATIENT DOES MEET INCLUSION CRITERIA)?yes Needs oxygen at home (<6L)? 2L [place]  Active safety concerns (ie Unable to  use bedside commode independently and lacks caregiver support for safety- needs SNF placement, unable to obtain IV access)? no Has skin check been performed?pending  Has Physical Therapy screened the patient?yes Common admission diagnoses including: CAP, COPD Exacerbation, Acute on chronic heart failure, Cellulitis, UTI , dehydration, acute resp failure with hypoxia (requiring <5L)   Social Screening: Denies significant ETOH intake? no Does not smoke and understands may not smoke in the presence of oxygen? no  Patient states able to use iPad/phone for communication/has family who is able to use? yes  Patient has agreed to be compliant with medication and treatment regimen of the program?yes Any active drug use in patient or primary caregiver including daily dosing of methadone?  no Stable home environment ( access to  appropriate heating in cold conditions and/or appropriate air conditioning in hot conditions and/or no running water/electricity)? yes  No aggressive pets at home? no Firearm present  with ability or willingness to store them unloaded in a locked case for duration of hospitalization? None reported   Ambulatory? Yes- performs all of ADLs independently for himself and wife  (independently cane, walker  wheelchair bound, bed bound) Bed bugs present on home evaluation?no  Family support system in place? yes Patient feels safe at home does not endorse any violence? es Any actively decompensated behavioral healthy issues including agitation/aggressive behavior? no   Patient requests food to be provided by hospital home program?no PT/OT eval completed and not requiring SNF, ALF, inpatient rehab? yes   To be admitted to the Hospital at [facility] program, a patient generally must meet the following: 1. Requirement for Inpatient Level of Care: The patient's condition must necessitate an inpatient level of care. This is typically indicated by one or more of the following, depending on their  specific diagnosis: -Persistent tachycardia despite appropriate treatment (e.g., for Heart Failure, UTI). - Persistent tachypnea (rapid breathing) or dyspnea (shortness of breath) that hasn't improved sufficiently with observation care (e.g., for Heart Failure, Pneumonia, Viral Illness, COVID). - Hypoxemia (low oxygen levels), such as a new need for oxygen, an increased need from baseline, or specific oxygen saturation levels (e.g., SpO2 <90-94% depending on the condition) that persist despite observation (e.g., for Heart Failure, COPD, Pneumonia, Viral Illness, COVID). - Need for Intravenous (IV) hydration due to an inability to maintain oral hydration, which persists despite observation care (e.g., for Cellulitis, UTI, Viral Illness, COVID). - Specific to Heart Failure: Persistent pulmonary edema, indicated by a new oxygen need, lack of improvement with IV diuretics, and ongoing tachypnea/dyspnea. - Specific to COPD: A decrease in known baseline resting oxygen saturation (SpO2) by 4% or more, or an increase in pre-existing supplemental oxygen requirements, which persists despite observation and requires continued close monitoring. - Specific to Pneumonia: A Pneumonia Severity Index (PSI) class IV (moderate risk). - Specific to Cellulitis: Failure of outpatient antibiotic therapy (indicated by progression or no improvement after a minimum of 48 hours on an adequate regimen) or a clinical presentation (like acuity or rapidity of progression) that requires the intensity of monitoring found in an inpatient setting. - Specific to UTI: Persistence or worsening of clinical findings like fever, pain, or dehydration despite observation care; presence of significant uropathy; suspected infection of an indwelling prosthetic device, stent, implant, or graft; or pregnancy with suspected pyelonephritis. 2. Appropriateness for Hospital at Home Setting: The patient's overall clinical picture, including  the severity of their illness, their care needs, and their medical history and comorbidities, must be suitable for management in the Hospital at Home environment. This essentially means that none of the exclusion criteria (listed below) are met.  Unified Exclusion Criteria for Hospital at Home Admission: A patient would not be eligible for Hospital at Home if any of the following are present: - Hemodynamic Instability: - Hypotension (low blood pressure) is present. - Respiratory Instability or Needs Beyond Program Capability: - There is a new need for invasive or noninvasive ventilatory assistance (like BiPAP or a ventilator). - Oxygenation is not sufficient, generally indicated if an FiO2 (fraction of inspired oxygen) of 45% (which is about 6 Liters/minute via nasal cannula) or more is required to keep oxygen saturation (SpO2) at 90% or greater. - Monitoring or Procedural Needs Beyond Program Capability: - There is a need for invasive monitoring, such  as a pulmonary artery catheter or an arterial line. - There is a need for immediate-response telemetry monitoring (for dangerous arrhythmia detection and subsequent immediate intervention). - The required medication regimen is beyond the capabilities of Hospital at Home (e.g., dosing intervals are too frequent for home administration). - There is a need for a procedure that cannot be performed by the [facility] at [facility] team (e.g., significant wound debridement or abscess drainage for cellulitis, or percutaneous nephrostomy for a complicated UTI). - Significant Organ Dysfunction or Markers of Severe Illness: - Mental status is not at baseline, or there is altered mental status suggestive of inadequate perfusion. - Renal (kidney) function is unstable or showing an ongoing decline. - There is evidence of inadequate perfusion, such as metabolic acidosis or myocardial ischemia. - Uncompensated acidosis is present. - Condition-Specific Severity  or Complications Making Home Care Unsuitable: -For Heart Failure: Known severe cardiac valvular disease (e.g., aortic stenosis, mitral regurgitation); or severe peripheral edema that impairs the ability to urinate or ambulate. -For COPD: Known concurrent comorbidity or finding that indicates a higher-risk COPD exacerbation (e.g., pulmonary fibrosis, cavitation, pleural effusion, pneumothorax, rib fracture). -For Pneumonia: Pneumonia Severity Index (PSI) class V (indicating high risk for inpatient mortality); known concurrent comorbidity or finding that indicates higher-risk pneumonia (e.g., pulmonary fibrosis, cavitation, large or loculated pleural effusion); or a concomitant serious infectious process like endocarditis or empyema. -For Cellulitis: Orbital, periorbital, or necrotizing infection is suspected; or a concomitant serious infectious process like endocarditis, septic emboli, or septic joint space infection. -For UTI: Urinary tract obstruction (e.g., kidney stone, bladder outlet obstruction); or a concomitant serious infectious process like endocarditis or septic emboli. -For Viral Illness & COVID-19: A concomitant serious infectious process like endocarditis or empyema. General Comorbidities or Status: -The patient is significantly immunosuppressed (this applies to Pneumonia, Cellulitis, UTI, Viral Illness, and COVID-19). -The patient meets inpatient admission criteria for a second diagnosis, or has care needs beyond the capabilities of Hospital at Home due to an active clinically significant comorbidity. (This is a general exclusion across all listed conditions)  Time spent: >60 Minutes  [facility] 07/15/2024, 3:34 PM

## 2024-07-16 DIAGNOSIS — E876 Hypokalemia: Secondary | ICD-10-CM

## 2024-07-16 DIAGNOSIS — I130 Hypertensive heart and chronic kidney disease with heart failure and stage 1 through stage 4 chronic kidney disease, or unspecified chronic kidney disease: Principal | ICD-10-CM

## 2024-07-16 LAB — CBC
HCT: 33 % — ABNORMAL LOW (ref 39.0–52.0)
Hemoglobin: 10.9 g/dL — ABNORMAL LOW (ref 13.0–17.0)
MCH: 31.1 pg (ref 26.0–34.0)
MCHC: 33 g/dL (ref 30.0–36.0)
MCV: 94.3 fL (ref 80.0–100.0)
Platelets: 221 K/uL (ref 150–400)
RBC: 3.5 MIL/uL — ABNORMAL LOW (ref 4.22–5.81)
RDW: 14.1 % (ref 11.5–15.5)
WBC: 9.7 K/uL (ref 4.0–10.5)
nRBC: 0 % (ref 0.0–0.2)

## 2024-07-16 LAB — BASIC METABOLIC PANEL WITH GFR
Anion gap: 10 (ref 5–15)
BUN: 50 mg/dL — ABNORMAL HIGH (ref 8–23)
CO2: 25 mmol/L (ref 22–32)
Calcium: 8.7 mg/dL — ABNORMAL LOW (ref 8.9–10.3)
Chloride: 104 mmol/L (ref 98–111)
Creatinine, Ser: 1.96 mg/dL — ABNORMAL HIGH (ref 0.61–1.24)
GFR, Estimated: 32 mL/min — ABNORMAL LOW (ref >60)
Glucose, Bld: 170 mg/dL — ABNORMAL HIGH (ref 70–99)
Potassium: 4.7 mmol/L (ref 3.5–5.1)
Sodium: 139 mmol/L (ref 135–145)

## 2024-07-16 LAB — MAGNESIUM: Magnesium: 2.1 mg/dL (ref 1.7–2.4)

## 2024-07-16 NOTE — Progress Notes (Signed)
 Pt seen for routine morning [facility] visit. Pt appears well and states he slept very well and he was so happy with the program and being able to get treated at home.   Vital signs and assessment obtained as noted. CBG was 173 using [facility] glucometer and CBG was 161 via Pt's Dexcom glucose monitor. Pt's weight was 147.9 lbs.  Pt states he slept with [place] on 1 LPM with no issues but increased to 2 LPM after waking up. Pt is currently on 2 LPM now. I decreased flow rate to 1 LPM and monitored SpO2 via portable pulse ox. Pt's sats remained at approx 94% at rest on 1 LPM. I began ambulating pt to see if his SpO2 dropped and it did go down to 91% as documented under the vital signs tab of flowsheets. I increased Pt's flow rate back to 2 LPM.   Lung sounds were clear but slightly diminished in all fields. Pt has a congested sounding cough but denies any sputum.  Pt has edema as noted on both lower legs, mainly towards his ankles and feet. Pt also reports having been sitting with his feet down while using his computer for the past few hours since waking up. Pt states he did not notice as much edema when he first woke up. No wounds noted.  Pt's IV flushes well, however he was experiencing pain where the hub of the pigtail was pressed against his skin. He also has a small bruise to that area. I re-wrappes with gauze, ensuring anywhere hard plastic of the pigtail was against him, was on top of gauze and not directly on his skin. Pt states it feels much better.   Pt had virtual visit with RN and Dr. Laurence and plan of care discussed.   I left a hat for Pt to measure urine output. I also left a log sheet and showed PT how to track his input and output. Pt is using an approximately 24 oz tumbler to drink water out of so I showed him how to make a new tally mark on the sheet every time he drank one full cup.  Labs drawn as noted.  Pt states he is going to take a nap for a bit and I encouraged Pt to elevate his legs if  possible and he states he will.  Pt encouraged to call the virtual hub if he has any problems.

## 2024-07-16 NOTE — Progress Notes (Addendum)
 Talked to the patient over the phone. He reports having a very good night. No complaints of pain or discomfort at this time. Denies SOB.  Oxygen on at 2L [place]. Plan for paramedic arrival between 0900-1000. Pt aware.

## 2024-07-16 NOTE — Assessment & Plan Note (Addendum)
 07-16-2024 stable. On lipitor 40 mg daily. On eliquis  2.5 mg bid.  07-17-2024 stable.  07-18-2024 stable.  07-19-2024 stable

## 2024-07-16 NOTE — Assessment & Plan Note (Addendum)
 07-16-2024 due to his pulmonary edema from CHF exacerbation. Wean O2 as tolerated. Will continue to ambulate him each day and wean O2 as needed. Pt was not on home O2 prior to admission. Hopefully he will not need home O2 at discharge.  07-17-2024 remains on supplemental O2. Will attempt to wean him to RA prior to discharge from Hospital at Home program.  07-18-2024 attempt to wean his O2 to RA. He is not normally on home O2.  07-19-2024 O2 sats dropped to 88% on RA while ambulating. Will remain on 1 L/min O2. Home O2 will be arranged.

## 2024-07-16 NOTE — Progress Notes (Signed)
 Pt seen for routine evening [facility] visit. Pt appears well and said he is feeling good. Pt states he was able to take a nap earlier and had friends over for a visit during lunch. Pt has no complaints or pain at this time.  Vital signs and assessment obtained as noted.  Iv flushed with no issues and curos cap added.  Medications administered as noted. Pt requested to take his Eliquis  with dinner now instead of bedtime because that is what he normally does. Medication re-timed and administered with virtual RN.  Pt encouraged to call back with any problems.

## 2024-07-16 NOTE — Care Management (Signed)
 Transition of Care [facility]) - Inpatient Brief Assessment   Patient Details  Name: Peter Scott MRN: [id number] Date of Birth: 10-07-1932  [facility] CM/SW Contact:    Corean JAYSON Canary, RN Phone Number: 07/16/2024, 11:48 AM   Clinical Narrative:  Discussed in ID rounds, this is a [facility] patient with CHF, Type II diabetes Post CABG TAVR. Reviewed chart, NO PT follow up needed, he has been independent at home. He is on 1 LPM of oxygen currently with  oxygen saturations > 90% on ambulation.  He will continued to be diuresed   IP CM will watch for needs, recommendations, and transitions of care.  Transition of Care Asessment: Insurance and Status: Insurance coverage has been reviewed Patient has primary care physician: Yes Home environment has been reviewed: [place] Prior level of function:: Indepedent Prior/Current Home Services: No current home services Social Drivers of Health Review: SDOH reviewed no interventions necessary Readmission risk has been reviewed: Yes Transition of care needs: transition of care needs identified, TOC will continue to follow

## 2024-07-16 NOTE — Progress Notes (Signed)
 Patient did well overnight. He did have a few current health alarms for bradycardia with heart rate ranging as low as 46 to 49. Patient's normal heart rate appears to be low at baseline with rate in the 50's. RN reached out to patient around 2:00 am via phone to verify that he was ok. Patient stated that he was fine, denied chest pain or shortness of breath and stated that a low heart rate was normal for him. RN encouraged patient to call if any issues or symptoms did arise.

## 2024-07-16 NOTE — Assessment & Plan Note (Addendum)
 07-16-2024 baseline Scr 1.8-2.0. monitoring Scr during diuresis. Off ARB for now during diuresis.  07-17-2024 Scr 1.90 today. Stable.  07-18-2024 awaiting AM labs.  07-19-2024 BUN/Scr 62/2.04. this is the upper limits of his normal. Will stop IV diuresis to avoid an AKI. Change to po lasix  40 mg bid. Have BMP checked at PCP office this week to monitor renal function.  Would only decrease lasix  to 20 mg bid if his renal function takes a significant decline. (I.e. Scr >=2.5 or BUN >=80)

## 2024-07-16 NOTE — Assessment & Plan Note (Addendum)
 07-16-2024 on amiodarone  200 mg daily and eliquis  2.5 mg bid.  07-17-2024 continue amio and eliquis .  07-19-2024 stable. Continue amio and eliquis .

## 2024-07-16 NOTE — Subjective & Objective (Addendum)
 Pt seen during video visit. Vickie (paramedic) is physically at pt's home and is performing exam.  Pt states he is using incentive spirometry very well now. Wasn't able to breathe in more than 750 ml prior to IV lasix . Now breathing in well over 1500 ml.  He states he is coughing up more sputum now. It is clear. No pus.  Still with +2 pitting pedal edema.

## 2024-07-16 NOTE — Assessment & Plan Note (Signed)
 07-16-2024 given 40 meq po KCL yesterday and is on daily replacement. Check BMP. May need higher daily dose during aggressive diuresis.  07-17-2024 resolved. K of 4.8 today.

## 2024-07-16 NOTE — Assessment & Plan Note (Addendum)
 07-16-2024 continue with IV lasix  40 mg bid. Holding ARB to give SBP and Scr more room for diuresis without causing injury to his already decreased renal function(CKD 3b). Monitor his daily weights.  **update. Today's BUN/Scr 50/1.96.  yesterday, BUN/Scr 45/1.83. continue with IV lasix . Repeat labs in AM to monitor diuresis. Weight today was 147.9 lbs. Will see what he weighs tomorrow.  07-17-2024 BUN/Scr 52 and 1.90 today.  Was 50/1.96 today. Pt still with +2 pitting edema of dorsum of feet. Continue with IV lasix  for diuresis. Weight 148 lbs today. No change in his weight.  07-18-2024 awaiting labs from today.  Still with +2 pitting pedal edema. He states his edema is improving. Continue with IV lasix  for diuresis until he is able to wean off O2 completely and/or his renal function starts to decline. Will tentatively keep him in the program 1 more day. Possible DC tomorrow. Weight down to 143.6 lbs. Yesterday weight was 148 lbs. On admission, weight was 153.5 lbs. He is down nearly 10 lbs.  07-19-2024 Pitting edema has improved. Still hypoxic with ambulation. Needing 1 L/min O2. BUN/Scr up to 62 and 2.04.  IV lasix  held this AM.  Will change to PO lasix  40 mg bid. Plan for DC today from [facility]. Pt will need f/u BMP this week with PCP office to monitor his Scr while on 40 mg po bid lasix . Pt has appt with cardiology on August 19, 2024.  Would only decrease lasix  to 20 mg bid if his renal function takes a significant decline. (I.e. Scr >=2.5 or BUN >=80)    Weight Information (since admission)     Date/Time Weight Weight in lbs BSA (Calculated - sq m) BMI (Calculated) Who   07/18/24 1000 65.1 kg 143.6 lbs -- 21.84 AB   07/17/24 1800 67 kg 147.7 lbs -- 22.46 SH   07/17/24 0900 67.1 kg 148 lbs -- 22.51 AB   07/16/24 1700 67.4 kg 148.5 lbs -- 22.58 SH   07/16/24 0900 67.1 kg 147.9 lbs -- 22.49 [facility]   07/15/24 1900 68 kg 150 lbs -- 22.81 DK   07/15/24 0500 66.5 kg 146.7 lbs -- 22.31  GR   07/14/24 1942 69.6 kg 153.5 lbs 1.83 sq meters 23.34 GR   07/14/24 0945 68.5 kg 151 lbs 1.83 sq meters 22.29 AN

## 2024-07-16 NOTE — Assessment & Plan Note (Addendum)
 07-16-2024 stable. On lantus  6 units daily.  07-17-2024 pt has CGM and uses it to monitor his glucose. CBG 171 this AM.  07-18-2024 stable.  07-19-2024 stable. Continue lantus  at home.

## 2024-07-16 NOTE — Progress Notes (Addendum)
 Hospital at [facility] PROGRESS NOTE    Peter Scott  FMW:[id number] DOB: 02-09-1932 DOA: 07/14/2024 PCP: Watt Harlene BROCKS, MD  Subjective: Pt seen during video visit. [name](paramedic) is physically at pt's home and is performing exam.  Pt is breathing better. He states he is very impressed with [facility] program and is very thankful to be back at home.  He states that at home, he was told by his cardiologist to take lasix  20 mg bid. When he noticed that his legs were getting more swollen and he was getting increasingly dyspneic, he increased his dose back to 40 mg bid(original dose prescribed by Dr. Pietro in 12-2023.  His LE edema is improving and he is able to ambulate his home without significant SOB.  He does not weigh himself on a daily basis. He admits that he was probably drinking more water than he is suppose to due to conflicting messages he was getting from his friends and family regarding his water intake.   Provider Location: [place], [place] Patient examined by: [name](paramedic)  Hospital Course: CC: bilateral LE edema, DOE x 5 days HPI: Peter Scott is a 89 y.o. male with medical history significant for hypertension, type 2 diabetes mellitus, CAD status post CABG, history of TAVR, atrial fibrillation on Eliquis , chronic HFpEF, and pulmonary hypertension who presents with progressive SOB, orthopnea, swelling, and cough.    Patient reports that he began developing shortness of breath and increased swelling roughly a week ago and has had a lot of difficulty performing his usual activities since 07/09/2024 due to dyspnea.  He has had trouble sleeping due to orthopnea and cough.  He reports strict adherence with his medications.   [facility] ED Course: Upon arrival to the ED, patient is found to be afebrile and saturating 90% on room air while at rest with tachypnea, normal HR, and stable BP.  Labs are most notable for creatinine 1.88, troponin 73, and proBNP 4291.  Chest x-ray  reveals bibasilar edema or atelectasis with minimal pleural effusions.   Patient was treated with IV Lasix  and transferred to [facility] for admission.  Significant Events: Admitted 07/14/2024 acute on chronic diastolic CHF   Admission Labs: WBC 11.6, HgB 11.2, plt 196 Pro-BNP 4291 Na 141, K 4.2, CO2 of 21, BUN 49, Scr 1.88, glu 178 Covid/RSV/flu Negative  Admission Imaging Studies: CXR Increased bibasilar interstitial densities concerning for possible edema or atelectasis. Minimal pleural effusions are noted.  Significant Labs:   Significant Imaging Studies:   Antibiotic Therapy: Anti-infectives (From admission, onward)    None       Procedures:   Consultants:     Assessment and Plan: * Acute on chronic heart failure with preserved ejection fraction (HFpEF) (HCC) 07-16-2024 continue with IV lasix  40 mg bid. Holding ARB to give SBP and Scr more room for diuresis without causing injury to his already decreased renal function(CKD 3b). Monitor his daily weights.  **update. Today's BUN/Scr 50/1.96.  yesterday, BUN/Scr 45/1.83. continue with IV lasix . Repeat labs in AM to monitor diuresis. Weight today was 147.9 lbs. Will see what he weighs tomorrow.  Acute respiratory failure with hypoxia (HCC) 07-16-2024 due to his pulmonary edema from CHF exacerbation. Wean O2 as tolerated. Will continue to ambulate him each day and wean O2 as needed. Pt was not on home O2 prior to admission. Hopefully he will not need home O2 at discharge.  CKD stage 3b, GFR 30-44 ml/min (HCC) - baseline Scr 1.8-2.0 07-16-2024 baseline Scr 1.8-2.0.  monitoring Scr during diuresis. Off ARB for now during diuresis.  Paroxysmal atrial fibrillation (HCC) 07-16-2024 on amiodarone  200 mg daily and eliquis  2.5 mg bid.  Type II diabetes mellitus, well controlled (HCC) 07-16-2024 stable. On lantus  6 units daily.  Coronary artery disease 07-16-2024 stable. On lipitor 40 mg daily. On eliquis  2.5 mg  bid.  Essential hypertension, benign 07-16-2024 on IV lasix . Holding ARB to allow more room for diuresis and to avoid AKI.  Hypokalemia 07-16-2024 given 40 meq po KCL yesterday and is on daily replacement. Check BMP. May need higher daily dose during aggressive diuresis.  DVT prophylaxis: apixaban  (ELIQUIS ) tablet 2.5 mg Start: 07/14/24 2200 apixaban  (ELIQUIS ) tablet 2.5 mg     Code Status: Limited: Do not attempt resuscitation (DNR) -DNR-LIMITED -Do Not Intubate/DNI  Family Communication: no family present on video call. Pt is decisional Disposition Plan: home Reason for continuing need for hospitalization: continue with IV lasix  for diuresis, monitoring labs. Continue with supplemental O2.  Objective: Vitals:   07/15/24 1900 07/16/24 0900 07/16/24 0950 07/16/24 1000  BP: (!) 159/66 (!) 170/84    Pulse: (!) 54 (!) 52    Resp:  16    Temp:  97.6 F (36.4 C)    TempSrc:  Oral    SpO2: 90% 98% 94% 91%  Weight: 68 kg     Height:       No intake or output data in the 24 hours ending 07/16/24 1632  Filed Weights   07/14/24 1942 07/15/24 0500 07/15/24 1900  Weight: 69.6 kg 66.5 kg 68 kg    Examination: Note that physical examination performed by paramedic on-scene and documented by provider Video Exam performed by video enabled technology  Physical Exam Vitals and nursing note reviewed.  Constitutional:      General: He is not in acute distress.    Appearance: He is not ill-appearing, toxic-appearing or diaphoretic.  HENT:     Head: Normocephalic and atraumatic.  Eyes:     General: No scleral icterus. Cardiovascular:     Rate and Rhythm: Normal rate and regular rhythm.  Pulmonary:     Effort: Pulmonary effort is normal. No respiratory distress.     Comments: Diminished BS bilaterally Abdominal:     General: Bowel sounds are normal. There is no distension.     Palpations: Abdomen is soft.  Musculoskeletal:     Right lower leg: Edema present.     Left lower leg:  Edema present.     Comments: +2 pitting ankle and pedal edema bilaterally No pretibial edema  Skin:    General: Skin is warm and dry.     Capillary Refill: Capillary refill takes less than 2 seconds.  Neurological:     General: No focal deficit present.     Mental Status: He is alert and oriented to person, place, and time.     Data Reviewed: I have personally reviewed following labs and imaging studies  CBC: Recent Labs  Lab 07/14/24 0951 07/15/24 0224 07/16/24 1033  WBC 11.6* 8.6 9.7  HGB 11.2* 10.0* 10.9*  HCT 33.5* 30.1* 33.0*  MCV 93.6 94.1 94.3  PLT 196 193 221   Basic Metabolic Panel: Recent Labs  Lab 07/14/24 0951 07/15/24 0224 07/16/24 1033  NA 141 139 139  K 4.2 3.4* 4.7  CL 104 104 104  CO2 21* 24 25  GLUCOSE 178* 153* 170*  BUN 49* 45* 50*  CREATININE 1.88* 1.83* 1.96*  CALCIUM  9.1 8.4* 8.7*  MG  --  2.0 2.1   GFR: Estimated Creatinine Clearance: 24.1 mL/min (A) (by C-G formula based on SCr of 1.96 mg/dL (H)). CBG: Recent Labs  Lab 07/14/24 1641 07/14/24 2106 07/15/24 0639 07/15/24 1116 07/15/24 1350  GLUCAP 164* 218* 117* 194* 203*   Recent Results (from the past 240 hours)  Resp panel by RT-PCR (RSV, Flu A&B, Covid) Anterior Nasal Swab     Status: None   Collection Time: 07/14/24 10:47 AM   Specimen: Anterior Nasal Swab  Result Value Ref Range Status   SARS Coronavirus 2 by RT PCR NEGATIVE NEGATIVE Final    Comment: (NOTE) SARS-CoV-2 target nucleic acids are NOT DETECTED.  The SARS-CoV-2 RNA is generally detectable in upper respiratory specimens during the acute phase of infection. The lowest concentration of SARS-CoV-2 viral copies this assay can detect is 138 copies/mL. A negative result does not preclude SARS-Cov-2 infection and should not be used as the sole basis for treatment or other patient management decisions. A negative result may occur with  improper specimen collection/handling, submission of specimen other than  nasopharyngeal swab, presence of viral mutation(s) within the areas targeted by this assay, and inadequate number of viral copies(<138 copies/mL). A negative result must be combined with clinical observations, patient history, and epidemiological information. The expected result is Negative.  Fact Sheet for Patients:  BloggerCourse.com  Fact Sheet for Healthcare Providers:  SeriousBroker.it  This test is no t yet approved or cleared by the [place]  FDA and  has been authorized for detection and/or diagnosis of SARS-CoV-2 by FDA under an Emergency Use Authorization (EUA). This EUA will remain  in effect (meaning this test can be used) for the duration of the COVID-19 declaration under Section 564(b)(1) of the Act, 21 U.S.C.section 360bbb-3(b)(1), unless the authorization is terminated  or revoked sooner.       Influenza A by PCR NEGATIVE NEGATIVE Final   Influenza B by PCR NEGATIVE NEGATIVE Final    Comment: (NOTE) The Xpert Xpress SARS-CoV-2/FLU/RSV plus assay is intended as an aid in the diagnosis of influenza from Nasopharyngeal swab specimens and should not be used as a sole basis for treatment. Nasal washings and aspirates are unacceptable for Xpert Xpress SARS-CoV-2/FLU/RSV testing.  Fact Sheet for Patients: BloggerCourse.com  Fact Sheet for Healthcare Providers: SeriousBroker.it  This test is not yet approved or cleared by the [place]  FDA and has been authorized for detection and/or diagnosis of SARS-CoV-2 by FDA under an Emergency Use Authorization (EUA). This EUA will remain in effect (meaning this test can be used) for the duration of the COVID-19 declaration under Section 564(b)(1) of the Act, 21 U.S.C. section 360bbb-3(b)(1), unless the authorization is terminated or revoked.     Resp Syncytial Virus by PCR NEGATIVE NEGATIVE Final    Comment:  (NOTE) Fact Sheet for Patients: BloggerCourse.com  Fact Sheet for Healthcare Providers: SeriousBroker.it  This test is not yet approved or cleared by the [place]  FDA and has been authorized for detection and/or diagnosis of SARS-CoV-2 by FDA under an Emergency Use Authorization (EUA). This EUA will remain in effect (meaning this test can be used) for the duration of the COVID-19 declaration under Section 564(b)(1) of the Act, 21 U.S.C. section 360bbb-3(b)(1), unless the authorization is terminated or revoked.  Performed at [facility], [address], [place], [place] [address]      Radiology Studies: No results found.   Scheduled Meds:  amiodarone   200 mg Oral Daily   amLODipine   10 mg Oral  Daily   apixaban   2.5 mg Oral BID   atorvastatin   40 mg Oral QHS   furosemide   40 mg Intravenous Q12H   insulin  glargine  6 Units Subcutaneous QHS   potassium chloride   40 mEq Oral Daily   Continuous Infusions:   LOS: 2 days   Time spent: 60 minutes  Camellia Door, DO  [facility]  07/16/2024, 4:32 PM

## 2024-07-16 NOTE — Assessment & Plan Note (Addendum)
 07-16-2024 on IV lasix . Holding ARB to allow more room for diuresis and to avoid AKI.  07-17-2024 BP stable off ARB.  Will restart after he has been adequately diuresed.  07-18-2024 stable.  07-19-2024 keep him off ARB while he is on lasix  40 mg bid. Hopefully his ARB can be restarted once he sees his cardiologist on Aug 19, 2024. Have BMP checked at PCP office this week to monitor renal function. Would only decrease lasix  to 20 mg bid if his renal function takes a significant decline. (I.e. Scr >=2.5 or BUN >=80)

## 2024-07-16 NOTE — Hospital Course (Addendum)
 CC: bilateral LE edema, DOE x 5 days HPI: Peter Scott is a 89 y.o. male with medical history significant for hypertension, type 2 diabetes mellitus, CAD status post CABG, history of TAVR, atrial fibrillation on Eliquis , chronic HFpEF, and pulmonary hypertension who presents with progressive SOB, orthopnea, swelling, and cough.    Patient reports that he began developing shortness of breath and increased swelling roughly a week ago and has had a lot of difficulty performing his usual activities since 07/09/2024 due to dyspnea.  He has had trouble sleeping due to orthopnea and cough.  He reports strict adherence with his medications.   [facility] ED Course: Upon arrival to the ED, patient is found to be afebrile and saturating 90% on room air while at rest with tachypnea, normal HR, and stable BP.  Labs are most notable for creatinine 1.88, troponin 73, and proBNP 4291.  Chest x-ray reveals bibasilar edema or atelectasis with minimal pleural effusions.   Patient was treated with IV Lasix  and transferred to [facility] for admission.  Significant Events: Admitted 07/14/2024 acute on chronic diastolic CHF   Admission Labs: WBC 11.6, HgB 11.2, plt 196 Pro-BNP 4291 Na 141, K 4.2, CO2 of 21, BUN 49, Scr 1.88, glu 178 Covid/RSV/flu Negative  Admission Imaging Studies: CXR Increased bibasilar interstitial densities concerning for possible edema or atelectasis. Minimal pleural effusions are noted.  Significant Labs:   Significant Imaging Studies:   Antibiotic Therapy: Anti-infectives (From admission, onward)    None       Procedures:   Consultants:

## 2024-07-16 NOTE — Plan of Care (Signed)
  Problem: Education: Goal: Ability to describe self-care measures that may prevent or decrease complications (Diabetes Survival Skills Education) will improve Outcome: Progressing Goal: Individualized Educational Video(s) Outcome: Progressing   Problem: Coping: Goal: Ability to adjust to condition or change in health will improve Outcome: Progressing   Problem: Fluid Volume: Goal: Ability to maintain a balanced intake and output will improve Outcome: Progressing   Problem: Health Behavior/Discharge Planning: Goal: Ability to identify and utilize available resources and services will improve Outcome: Progressing Goal: Ability to manage health-related needs will improve Outcome: Progressing   Problem: Metabolic: Goal: Ability to maintain appropriate glucose levels will improve Outcome: Progressing   Problem: Nutritional: Goal: Maintenance of adequate nutrition will improve Outcome: Progressing Goal: Progress toward achieving an optimal weight will improve Outcome: Progressing   Problem: Skin Integrity: Goal: Risk for impaired skin integrity will decrease Outcome: Progressing   Problem: Tissue Perfusion: Goal: Adequacy of tissue perfusion will improve Outcome: Progressing   Problem: Education: Goal: Knowledge of General Education information will improve Description: Including pain rating scale, medication(s)/side effects and non-pharmacologic comfort measures Outcome: Progressing   Problem: Health Behavior/Discharge Planning: Goal: Ability to manage health-related needs will improve Outcome: Progressing   Problem: Clinical Measurements: Goal: Ability to maintain clinical measurements within normal limits will improve Outcome: Progressing Goal: Will remain free from infection Outcome: Progressing Goal: Diagnostic test results will improve Outcome: Progressing Goal: Respiratory complications will improve Outcome: Progressing Goal: Cardiovascular complication will  be avoided Outcome: Progressing   Problem: Activity: Goal: Risk for activity intolerance will decrease Outcome: Progressing   Problem: Nutrition: Goal: Adequate nutrition will be maintained Outcome: Progressing   Problem: Coping: Goal: Level of anxiety will decrease Outcome: Progressing   Problem: Elimination: Goal: Will not experience complications related to bowel motility Outcome: Progressing Goal: Will not experience complications related to urinary retention Outcome: Progressing   Problem: Pain Managment: Goal: General experience of comfort will improve and/or be controlled Outcome: Progressing   Problem: Safety: Goal: Ability to remain free from injury will improve Outcome: Progressing   Problem: Skin Integrity: Goal: Risk for impaired skin integrity will decrease Outcome: Progressing   Problem: Education: Goal: Ability to demonstrate management of disease process will improve Outcome: Progressing Goal: Ability to verbalize understanding of medication therapies will improve Outcome: Progressing Goal: Individualized Educational Video(s) Outcome: Progressing   Problem: Activity: Goal: Capacity to carry out activities will improve Outcome: Progressing   Problem: Cardiac: Goal: Ability to achieve and maintain adequate cardiopulmonary perfusion will improve Outcome: Progressing   Problem: Education: Goal: Ability to demonstrate management of disease process will improve Outcome: Progressing Goal: Ability to verbalize understanding of medication therapies will improve Outcome: Progressing Goal: Individualized Educational Video(s) Outcome: Progressing   Problem: Activity: Goal: Capacity to carry out activities will improve Outcome: Progressing   Problem: Cardiac: Goal: Ability to achieve and maintain adequate cardiopulmonary perfusion will improve Outcome: Progressing

## 2024-07-17 DIAGNOSIS — E1122 Type 2 diabetes mellitus with diabetic chronic kidney disease: Secondary | ICD-10-CM

## 2024-07-17 LAB — CBC
HCT: 34.2 % — ABNORMAL LOW (ref 39.0–52.0)
Hemoglobin: 11.3 g/dL — ABNORMAL LOW (ref 13.0–17.0)
MCH: 31.1 pg (ref 26.0–34.0)
MCHC: 33 g/dL (ref 30.0–36.0)
MCV: 94.2 fL (ref 80.0–100.0)
Platelets: 258 K/uL (ref 150–400)
RBC: 3.63 MIL/uL — ABNORMAL LOW (ref 4.22–5.81)
RDW: 13.8 % (ref 11.5–15.5)
WBC: 9.6 K/uL (ref 4.0–10.5)
nRBC: 0 % (ref 0.0–0.2)

## 2024-07-17 LAB — BASIC METABOLIC PANEL WITH GFR
Anion gap: 12 (ref 5–15)
BUN: 52 mg/dL — ABNORMAL HIGH (ref 8–23)
CO2: 25 mmol/L (ref 22–32)
Calcium: 8.9 mg/dL (ref 8.9–10.3)
Chloride: 101 mmol/L (ref 98–111)
Creatinine, Ser: 1.9 mg/dL — ABNORMAL HIGH (ref 0.61–1.24)
GFR, Estimated: 33 mL/min — ABNORMAL LOW (ref >60)
Glucose, Bld: 171 mg/dL — ABNORMAL HIGH (ref 70–99)
Potassium: 4.8 mmol/L (ref 3.5–5.1)
Sodium: 138 mmol/L (ref 135–145)

## 2024-07-17 LAB — MAGNESIUM: Magnesium: 2.2 mg/dL (ref 1.7–2.4)

## 2024-07-17 NOTE — Plan of Care (Signed)
  Problem: Health Behavior/Discharge Planning: Goal: Ability to manage health-related needs will improve Outcome: Progressing   Problem: Metabolic: Goal: Ability to maintain appropriate glucose levels will improve Outcome: Progressing   Problem: Clinical Measurements: Goal: Ability to maintain clinical measurements within normal limits will improve Outcome: Progressing Goal: Will remain free from infection Outcome: Progressing Goal: Diagnostic test results will improve Outcome: Progressing Goal: Respiratory complications will improve Outcome: Progressing Goal: Cardiovascular complication will be avoided Outcome: Progressing   Problem: Elimination: Goal: Will not experience complications related to bowel motility Outcome: Progressing Goal: Will not experience complications related to urinary retention Outcome: Progressing   Problem: Safety: Goal: Ability to remain free from injury will improve Outcome: Progressing

## 2024-07-17 NOTE — Progress Notes (Signed)
 Pt seen for routine [facility] evening visit. Pt appears well and opens the door to greet me on my arrival. Pt states he has been having a great day and has no complaints or pain. Pt is still wearing ted hose and states he has been elevating his legs periodically.   Pt is still on 1 LPM o2 via [place] and has been all day. Pt states he walked up and down his hallway approximately 7 times without feeling winded but did feel somewhat tired afterwards. Hallway is approximately 18-20 feet.   Photos of Pts legs with and without ted hose obtained and uploaded to media tab per physician request. Pt's edema in his right lower leg has improved. Pt still has significant edema in both feet however it does appear somewhat improved compared to this morning.   Vital signs and assessment obtained as noted. Pt's CBG was 173 and RN made aware.  Medications administered including dual sign off for Pt's insulin .  IMM letter signed. One copy left with Pt and other copy placed in shadow chart in [facility] hub  Pt encouraged to call with any problems.

## 2024-07-17 NOTE — Progress Notes (Addendum)
 2345-Patient called for Bradycardia and low SPO2  88-90% reading. Patient stable laying on  sensor, also encouraged coughing and deep breathing. SPO2 99% by end of call.

## 2024-07-17 NOTE — Progress Notes (Addendum)
 Patient alerting for Tachypnea for Respiratory Rate 48. Contact call via phone and video call. Patient confirmed no Shortness of breath, chest pain, or feeling of distress requiring emergent attention. Patient reported having some coughing agreed to PRN Robitussin, education provided. Patient reported heading to bed and encouraged to contact RN or use help button as needed. Patient will continue to be monitored RR back to 15 by end of video call.    2157--Hypoxia for SpO2-83%--Patient confirmed was laying down on the side the sensor is on. Patient educated provided. SPO2 96% by end of contact call.

## 2024-07-17 NOTE — Progress Notes (Signed)
 Dexcom Reading : 181  RAU   8 days left before changing

## 2024-07-17 NOTE — Plan of Care (Signed)
  Problem: Education: Goal: Ability to describe self-care measures that may prevent or decrease complications (Diabetes Survival Skills Education) will improve Outcome: Progressing Goal: Individualized Educational Video(s) Outcome: Progressing   Problem: Coping: Goal: Ability to adjust to condition or change in health will improve Outcome: Progressing   Problem: Fluid Volume: Goal: Ability to maintain a balanced intake and output will improve Outcome: Progressing   Problem: Health Behavior/Discharge Planning: Goal: Ability to identify and utilize available resources and services will improve Outcome: Progressing Goal: Ability to manage health-related needs will improve Outcome: Progressing   Problem: Metabolic: Goal: Ability to maintain appropriate glucose levels will improve Outcome: Progressing   Problem: Nutritional: Goal: Maintenance of adequate nutrition will improve Outcome: Progressing Goal: Progress toward achieving an optimal weight will improve Outcome: Progressing   Problem: Skin Integrity: Goal: Risk for impaired skin integrity will decrease Outcome: Progressing   Problem: Tissue Perfusion: Goal: Adequacy of tissue perfusion will improve Outcome: Progressing   Problem: Education: Goal: Knowledge of General Education information will improve Description: Including pain rating scale, medication(s)/side effects and non-pharmacologic comfort measures Outcome: Progressing   Problem: Health Behavior/Discharge Planning: Goal: Ability to manage health-related needs will improve Outcome: Progressing   Problem: Health Behavior/Discharge Planning: Goal: Ability to manage health-related needs will improve Outcome: Progressing

## 2024-07-17 NOTE — Progress Notes (Signed)
 Completed virtual rounds with MD,paramedic at patient bedside. POC reviewed and discussed ,patient voices understanding and agreement. Pt reminded to call RN for any needs, RN and MD available at all times. Pt voices understanding. Pt aware of next planned visit and next call from RN.

## 2024-07-17 NOTE — Progress Notes (Signed)
 [facility] PROGRESS NOTE    [name]  FMW:[id number] DOB: [birth year] DOA: 07/14/2024 PCP: Watt Harlene BROCKS, MD  Subjective: Pt seen during video visit. [name](paramedic) is physically at pt's home and is performing exam.  Pt is walking around his home without difficulty. In fact, during video visit, he got out of his chair. Walked around in his home and retrieved his compression stockings and placed it on his legs all by himself.  He is breathing much better. Still on supplemental O2.  Labs are stable. He again states he is very happy with [facility] program.   Provider Location: [place], [place] Patient examined by: [name](paramedic)  Hospital Course: CC: bilateral LE edema, DOE x 5 days HPI: Peter Scott is a 89 y.o. male with medical history significant for hypertension, type 2 diabetes mellitus, CAD status post CABG, history of TAVR, atrial fibrillation on Eliquis , chronic HFpEF, and pulmonary hypertension who presents with progressive SOB, orthopnea, swelling, and cough.    Patient reports that he began developing shortness of breath and increased swelling roughly a week ago and has had a lot of difficulty performing his usual activities since 07/09/2024 due to dyspnea.  He has had trouble sleeping due to orthopnea and cough.  He reports strict adherence with his medications.   [facility] ED Course: Upon arrival to the ED, patient is found to be afebrile and saturating 90% on room air while at rest with tachypnea, normal HR, and stable BP.  Labs are most notable for creatinine 1.88, troponin 73, and proBNP 4291.  Chest x-ray reveals bibasilar edema or atelectasis with minimal pleural effusions.   Patient was treated with IV Lasix  and transferred to [facility] for admission.  Significant Events: Admitted 07/14/2024 acute on chronic diastolic CHF   Admission Labs: WBC 11.6, HgB 11.2, plt 196 Pro-BNP 4291 Na 141, K 4.2, CO2 of 21, BUN 49, Scr 1.88, glu  178 Covid/RSV/flu Negative  Admission Imaging Studies: CXR Increased bibasilar interstitial densities concerning for possible edema or atelectasis. Minimal pleural effusions are noted.  Significant Labs:   Significant Imaging Studies:   Antibiotic Therapy: Anti-infectives (From admission, onward)    None       Procedures:   Consultants:     Assessment and Plan: * Acute on chronic heart failure with preserved ejection fraction (HFpEF) (HCC) 07-16-2024 continue with IV lasix  40 mg bid. Holding ARB to give SBP and Scr more room for diuresis without causing injury to his already decreased renal function(CKD 3b). Monitor his daily weights.  **update. Today's BUN/Scr 50/1.96.  yesterday, BUN/Scr 45/1.83. continue with IV lasix . Repeat labs in AM to monitor diuresis. Weight today was 147.9 lbs. Will see what he weighs tomorrow.  07-17-2024 BUN/Scr 52 and 1.90 today.  Was 50/1.96 today. Pt still with +2 pitting edema of dorsum of feet. Continue with IV lasix  for diuresis. Weight 148 lbs today. No change in his weight.  Acute respiratory failure with hypoxia (HCC) 07-16-2024 due to his pulmonary edema from CHF exacerbation. Wean O2 as tolerated. Will continue to ambulate him each day and wean O2 as needed. Pt was not on home O2 prior to admission. Hopefully he will not need home O2 at discharge.  07-17-2024 remains on supplemental O2. Will attempt to wean him to RA prior to discharge from [facility] program.  CKD stage 3b, GFR 30-44 ml/min (HCC) - baseline Scr 1.8-2.0 07-16-2024 baseline Scr 1.8-2.0. monitoring Scr during diuresis. Off ARB for now during diuresis.  07-17-2024 Scr 1.90 today. Stable.  Paroxysmal atrial fibrillation (HCC) 07-16-2024 on amiodarone  200 mg daily and eliquis  2.5 mg bid.  07-17-2024 continue amio and eliquis .  Type II diabetes mellitus, well controlled (HCC) 07-16-2024 stable. On lantus  6 units daily.  07-17-2024 pt has CGM and uses it to  monitor his glucose. CBG 171 this AM.  Coronary artery disease 07-16-2024 stable. On lipitor 40 mg daily. On eliquis  2.5 mg bid.  07-17-2024 stable.  Essential hypertension, benign 07-16-2024 on IV lasix . Holding ARB to allow more room for diuresis and to avoid AKI.  07-17-2024 BP stable off ARB.  Will restart after he has been adequately diuresed.  Hypokalemia 07-16-2024 given 40 meq po KCL yesterday and is on daily replacement. Check BMP. May need higher daily dose during aggressive diuresis.  07-17-2024 resolved. K of 4.8 today.  DVT prophylaxis: apixaban  (ELIQUIS ) tablet 2.5 mg Start: 07/14/24 2200 apixaban  (ELIQUIS ) tablet 2.5 mg     Code Status: Limited: Do not attempt resuscitation (DNR) -DNR-LIMITED -Do Not Intubate/DNI  Family Communication: no family on video call. Pt is decisional. Disposition Plan: home Reason for continuing need for hospitalization: remains on IV lasix  for diuresis and supplemental O2 for acute hypoxic respiratory failure.  Objective: Vitals:   07/16/24 1000 07/16/24 1700 07/17/24 0900 07/17/24 0925  BP:  (!) 147/54 (!) 150/54   Pulse:  (!) 51 (!) 50   Resp:  16 16   Temp:  97.7 F (36.5 C) (!) 97.2 F (36.2 C)   TempSrc:  Oral    SpO2: 91% 95% 96% 95%  Weight:  67.4 kg 67.1 kg   Height:       No intake or output data in the 24 hours ending 07/17/24 1311 Filed Weights   07/16/24 0900 07/16/24 1700 07/17/24 0900  Weight: 67.1 kg 67.4 kg 67.1 kg    Examination: Note that physical examination performed by paramedic on-scene and documented by provider Video Exam performed by video enabled technology  Physical Exam Vitals and nursing note reviewed.  Constitutional:      General: He is not in acute distress.    Appearance: He is not toxic-appearing or diaphoretic.  HENT:     Head: Normocephalic and atraumatic.  Cardiovascular:     Rate and Rhythm: Normal rate.  Pulmonary:     Effort: Pulmonary effort is normal. No respiratory distress.   Abdominal:     General: Bowel sounds are normal.     Palpations: Abdomen is soft.  Musculoskeletal:     Right lower leg: Edema present.     Left lower leg: Edema present.     Comments: +2-3 pitting pedal/ankle edema bilaterally.  Skin:    General: Skin is warm and dry.     Capillary Refill: Capillary refill takes less than 2 seconds.  Neurological:     Mental Status: He is alert and oriented to person, place, and time.     Gait: Gait normal.     Data Reviewed: I have personally reviewed following labs and imaging studies  CBC: Recent Labs  Lab 07/14/24 0951 07/15/24 0224 07/16/24 1033 07/17/24 1002  WBC 11.6* 8.6 9.7 9.6  HGB 11.2* 10.0* 10.9* 11.3*  HCT 33.5* 30.1* 33.0* 34.2*  MCV 93.6 94.1 94.3 94.2  PLT 196 193 221 258   Basic Metabolic Panel: Recent Labs  Lab 07/14/24 0951 07/15/24 0224 07/16/24 1033 07/17/24 1002  NA 141 139 139 138  K 4.2 3.4* 4.7 4.8  CL 104 104 104 101  CO2 21* 24 25 25   GLUCOSE 178* 153* 170* 171*  BUN 49* 45* 50* 52*  CREATININE 1.88* 1.83* 1.96* 1.90*  CALCIUM  9.1 8.4* 8.7* 8.9  MG  --  2.0 2.1 2.2   GFR: Estimated Creatinine Clearance: 24.5 mL/min (A) (by C-G formula based on SCr of 1.9 mg/dL (H)). CBG: Recent Labs  Lab 07/14/24 1641 07/14/24 2106 07/15/24 0639 07/15/24 1116 07/15/24 1350  GLUCAP 164* 218* 117* 194* 203*    Recent Results (from the past 240 hours)  Resp panel by RT-PCR (RSV, Flu A&B, Covid) Anterior Nasal Swab     Status: None   Collection Time: 07/14/24 10:47 AM   Specimen: Anterior Nasal Swab  Result Value Ref Range Status   SARS Coronavirus 2 by RT PCR NEGATIVE NEGATIVE Final    Comment: (NOTE) SARS-CoV-2 target nucleic acids are NOT DETECTED.  The SARS-CoV-2 RNA is generally detectable in upper respiratory specimens during the acute phase of infection. The lowest concentration of SARS-CoV-2 viral copies this assay can detect is 138 copies/mL. A negative result does not preclude  SARS-Cov-2 infection and should not be used as the sole basis for treatment or other patient management decisions. A negative result may occur with  improper specimen collection/handling, submission of specimen other than nasopharyngeal swab, presence of viral mutation(s) within the areas targeted by this assay, and inadequate number of viral copies(<138 copies/mL). A negative result must be combined with clinical observations, patient history, and epidemiological information. The expected result is Negative.  Fact Sheet for Patients:  BloggerCourse.com  Fact Sheet for Healthcare Providers:  SeriousBroker.it  This test is no t yet approved or cleared by the [place]  FDA and  has been authorized for detection and/or diagnosis of SARS-CoV-2 by FDA under an Emergency Use Authorization (EUA). This EUA will remain  in effect (meaning this test can be used) for the duration of the COVID-19 declaration under Section 564(b)(1) of the Act, 21 U.S.C.section 360bbb-3(b)(1), unless the authorization is terminated  or revoked sooner.       Influenza A by PCR NEGATIVE NEGATIVE Final   Influenza B by PCR NEGATIVE NEGATIVE Final    Comment: (NOTE) The Xpert Xpress SARS-CoV-2/FLU/RSV plus assay is intended as an aid in the diagnosis of influenza from Nasopharyngeal swab specimens and should not be used as a sole basis for treatment. Nasal washings and aspirates are unacceptable for Xpert Xpress SARS-CoV-2/FLU/RSV testing.  Fact Sheet for Patients: BloggerCourse.com  Fact Sheet for Healthcare Providers: SeriousBroker.it  This test is not yet approved or cleared by the [place]  FDA and has been authorized for detection and/or diagnosis of SARS-CoV-2 by FDA under an Emergency Use Authorization (EUA). This EUA will remain in effect (meaning this test can be used) for the duration of  the COVID-19 declaration under Section 564(b)(1) of the Act, 21 U.S.C. section 360bbb-3(b)(1), unless the authorization is terminated or revoked.     Resp Syncytial Virus by PCR NEGATIVE NEGATIVE Final    Comment: (NOTE) Fact Sheet for Patients: BloggerCourse.com  Fact Sheet for Healthcare Providers: SeriousBroker.it  This test is not yet approved or cleared by the [place]  FDA and has been authorized for detection and/or diagnosis of SARS-CoV-2 by FDA under an Emergency Use Authorization (EUA). This EUA will remain in effect (meaning this test can be used) for the duration of the COVID-19 declaration under Section 564(b)(1) of the Act, 21 U.S.C. section 360bbb-3(b)(1), unless the authorization is terminated or revoked.  Performed at [facility],  [address] [place], [place] [address]      Scheduled Meds:  amiodarone   200 mg Oral Daily   amLODipine   10 mg Oral Daily   apixaban   2.5 mg Oral BID   atorvastatin   40 mg Oral QHS   furosemide   40 mg Intravenous Q12H   insulin  glargine  6 Units Subcutaneous QHS   potassium chloride   40 mEq Oral Daily   Continuous Infusions:   LOS: 3 days   Time spent: 55 minutes  Camellia Door, DO  [facility]  07/17/2024, 1:11 PM

## 2024-07-17 NOTE — Progress Notes (Addendum)
 Pt seen for routine [facility] morning visit. Pt appears well and states he slept excellently. Pt denies pain or any problems.  Vital signs and assessment obtained as noted. Pt's CBG is 173 and RN notified.  Pt had virtual visit with RN and Dr. Laurence.  Pt's lungs sound clear but still diminished in all fields. Pt's cough has become more productive and has been spitting up small amounts of thin, slightly yellow sputum. Pt states he feels like he is breathing better today.   Swelling noted to left ankle and foot. The edema on his left foot is non-pitting but appears increased today. Pt's right distal lower leg and ankle have +3 pitting edema and right foot has non-pitting but increased edema compared to yesterday. Pt states he noticed swelling seemed worse this morning upon waking. Pt has been sitting in a chair with feet in a dependant position for the past 2-3 hours this morning. Pt encouraged to wear ted hose throughout the day by Dr. Laurence. Pt was able to go find ted hose and put them on without assistance.  At 09:05 am I lowered Pt's flow rate to 1 LPM. At 09:24 I checked Pt's resting pulse ox which read 95%. I asked Pt to ambulate at 09:50 and monitored Pt's pulse ox reading. While ambulating on 1 LPM, pulse ox initially read 92% then 95% while standing and talking with his wife and myself in room at end of the hall, approximately 18 feet from his office, and then dropped to 91% as we walked back to his office. Pt remained on 1 LPM as I left the home.  IV care performed as noted. Labs drawn as ordered. Medications administered as noted. Pt encouraged to call back with any problems. Will plan on evening visit at [facility] today per Pt's request.

## 2024-07-17 NOTE — Progress Notes (Signed)
 1927--Introductory contact call via phone. Patient reported doing well, denies SOB, confirmed currently on [facility] without issues.  Patient agreed to video call via current health tablet now.    1920-video call completed. Patient identifiers reviewed  A&O Sitting in chair, 1LNC still applied. PM Medications reviewed no additional night medications due at this time, patient encouraged to contact RN for any PRN medication needs, or questions or concerns throughout the night. Assessment completed, Pain rated at 0. Plan for overnight, morning medications, and [facility] contact information reviewed.

## 2024-07-18 DIAGNOSIS — I2584 Coronary atherosclerosis due to calcified coronary lesion: Secondary | ICD-10-CM

## 2024-07-18 LAB — BASIC METABOLIC PANEL WITH GFR
Anion gap: 11 (ref 5–15)
BUN: 63 mg/dL — ABNORMAL HIGH (ref 8–23)
CO2: 25 mmol/L (ref 22–32)
Calcium: 8.7 mg/dL — ABNORMAL LOW (ref 8.9–10.3)
Chloride: 103 mmol/L (ref 98–111)
Creatinine, Ser: 1.96 mg/dL — ABNORMAL HIGH (ref 0.61–1.24)
GFR, Estimated: 32 mL/min — ABNORMAL LOW (ref >60)
Glucose, Bld: 162 mg/dL — ABNORMAL HIGH (ref 70–99)
Potassium: 4.7 mmol/L (ref 3.5–5.1)
Sodium: 139 mmol/L (ref 135–145)

## 2024-07-18 MED ORDER — FUROSEMIDE 10 MG/ML IJ SOLN
40.0000 mg | Freq: Every evening | INTRAMUSCULAR | Status: DC
  Administered 2024-07-18: 40 mg via INTRAVENOUS
  Filled 2024-07-18 (×2): qty 4

## 2024-07-18 MED ORDER — FUROSEMIDE 10 MG/ML IJ SOLN
40.0000 mg | Freq: Every day | INTRAMUSCULAR | Status: DC
  Filled 2024-07-18 (×2): qty 4

## 2024-07-18 NOTE — Progress Notes (Signed)
 Completed virtual rounds with MD,paramedic at patient bedside. POC reviewed and discussed ,patient voices understanding and agreement. Pt reminded to call RN for any needs, RN and MD available at all times. Pt voices understanding. Pt aware of next planned visit and next call from RN.

## 2024-07-18 NOTE — Progress Notes (Signed)
 Arrived to find the PT walking to the door to allow staff in. PT is on supplemental O2 via n/c @ 1lpm. PT stated that he has been up throughout the day walking around and denied any difficulty breathing or SOB. PT denied having any complaints or pain.   Lung sounds were found to be C/E in all fields. PT stated that he has been using his IS throughout the day and has been feeling better. PT's vitals were obtained and documented appropriately. All medications were administered as prescribed.   PT was reminded that if he needed anything throughout the night to call the virtual RN.      Note completed on 07/19/24 due to time.

## 2024-07-18 NOTE — Progress Notes (Addendum)
 Alarm Tachypnea for Respiratory Rate (49) and Hypoxia for SpO2 (83%)-Patient reached via phone. Patient confirmed stable, patient up to the bathroom, considerable movement also noted laying on wearable prior to getting out of bed, confirmed no SOB or distress. Will continue to monitor. SPO2 94% and RR 19 prior to end of call. Denies need for emergent care. Reports PRN cough medicine received at 2128 8/29 has helped. Patient will continue to be monitored.

## 2024-07-18 NOTE — Progress Notes (Signed)
 RN spoke with patient via phone to confirm paramedic visit and timing this am. Pt states he feels really good. His voice is strong and clear.

## 2024-07-18 NOTE — Progress Notes (Signed)
 Arrived to find the PT at the front door allowing paramedic Shron Ozer in. No obvious distress noted. PT is wearing n/c of O2. PT walks with a normal gait to the room and sits down without incident. PT stated that he slept well last night minus being woken up due to current health alarms that the RN was calling for.   Physical exam showed negative DCAPBTLS to the head, neck, or face. PERRLA. Negative JVD or tracheal deviation. Chest expansion is equal with non-labored breathing at rest. L/S were noted to  be clear and equal in bilateral, lower lobes. Bilateral upper lobes were noted to be clear upon inspiration and diminished on expiration. Negative JVD or tracheal deviation. Heart sounds were normal. PT denied any N/V/D. +2 pitting edema noted to bilateral feet, pedal pulses were present.   All medications were administered as prescribed. IV was patent and curos cap was replaced.   PT did walking pulse ox readings and were the following: Sitting 95% on 1lpm of O2. Walking with O2- 92%. Walking without O2- 89-91%. PT's heart rate did not have a significant change. His work of breathing did become more labored when walking but he denied any SOB.   PT was reminded that if he needed anything to call the virtual RN.

## 2024-07-18 NOTE — Progress Notes (Addendum)
 [facility] PROGRESS NOTE    Peter Scott  FMW:[id number] DOB: 05/18/1932 DOA: 07/14/2024 PCP: Peter Harlene BROCKS, MD  Subjective: Pt seen during video visit. Vickie (paramedic) is physically at pt's home and is performing exam.  Pt states he is using incentive spirometry very well now. Wasn't able to breathe in more than 750 ml prior to IV lasix . Now breathing in well over 1500 ml.  He states he is coughing up more sputum now. It is clear. No pus.  Still with +2 pitting pedal edema.   Provider Location: [place], [place] Patient examined by: Vickie(paramedic)  Hospital Course: CC: bilateral LE edema, DOE x 5 days HPI: Peter Scott is a 89 y.o. male with medical history significant for hypertension, type 2 diabetes mellitus, CAD status post CABG, history of TAVR, atrial fibrillation on Eliquis , chronic HFpEF, and pulmonary hypertension who presents with progressive SOB, orthopnea, swelling, and cough.    Patient reports that he began developing shortness of breath and increased swelling roughly a week ago and has had a lot of difficulty performing his usual activities since 07/09/2024 due to dyspnea.  He has had trouble sleeping due to orthopnea and cough.  He reports strict adherence with his medications.   [facility] ED Course: Upon arrival to the ED, patient is found to be afebrile and saturating 90% on room air while at rest with tachypnea, normal HR, and stable BP.  Labs are most notable for creatinine 1.88, troponin 73, and proBNP 4291.  Chest x-ray reveals bibasilar edema or atelectasis with minimal pleural effusions.   Patient was treated with IV Lasix  and transferred to [facility] for admission.  Significant Events: Admitted 07/14/2024 acute on chronic diastolic CHF   Admission Labs: WBC 11.6, HgB 11.2, plt 196 Pro-BNP 4291 Na 141, K 4.2, CO2 of 21, BUN 49, Scr 1.88, glu 178 Covid/RSV/flu Negative  Admission Imaging Studies: CXR Increased bibasilar interstitial  densities concerning for possible edema or atelectasis. Minimal pleural effusions are noted.  Significant Labs:   Significant Imaging Studies:   Antibiotic Therapy: Anti-infectives (From admission, onward)    None       Procedures:   Consultants:     Assessment and Plan: * Acute on chronic heart failure with preserved ejection fraction (HFpEF) (HCC) 07-16-2024 continue with IV lasix  40 mg bid. Holding ARB to give SBP and Scr more room for diuresis without causing injury to his already decreased renal function(CKD 3b). Monitor his daily weights.  **update. Today's BUN/Scr 50/1.96.  yesterday, BUN/Scr 45/1.83. continue with IV lasix . Repeat labs in AM to monitor diuresis. Weight today was 147.9 lbs. Will see what he weighs tomorrow.  07-17-2024 BUN/Scr 52 and 1.90 today.  Was 50/1.96 today. Pt still with +2 pitting edema of dorsum of feet. Continue with IV lasix  for diuresis. Weight 148 lbs today. No change in his weight.  07-18-2024 awaiting labs from today.  Still with +2 pitting pedal edema. He states his edema is improving. Continue with IV lasix  for diuresis until he is able to wean off O2 completely and/or his renal function starts to decline. Will tentatively keep him in the program 1 more day. Possible DC tomorrow. Weight down to 143.6 lbs. Yesterday weight was 148 lbs. On admission, weight was 153.5 lbs. He is down nearly 10 lbs.   Weight Information (since admission)     Date/Time Weight Weight in lbs BSA (Calculated - sq m) BMI (Calculated) Who   07/18/24 1000 65.1 kg 143.6 lbs --  21.84 AB   07/17/24 1800 67 kg 147.7 lbs -- 22.46 SH   07/17/24 0900 67.1 kg 148 lbs -- 22.51 AB   07/16/24 1700 67.4 kg 148.5 lbs -- 22.58 SH   07/16/24 0900 67.1 kg 147.9 lbs -- 22.49 [facility]   07/15/24 1900 68 kg 150 lbs -- 22.81 DK   07/15/24 0500 66.5 kg 146.7 lbs -- 22.31 GR   07/14/24 1942 69.6 kg 153.5 lbs 1.83 sq meters 23.34 GR   07/14/24 0945 68.5 kg 151 lbs 1.83 sq meters 22.29  AN        Acute respiratory failure with hypoxia (HCC) 07-16-2024 due to his pulmonary edema from CHF exacerbation. Wean O2 as tolerated. Will continue to ambulate him each day and wean O2 as needed. Pt was not on home O2 prior to admission. Hopefully he will not need home O2 at discharge.  07-17-2024 remains on supplemental O2. Will attempt to wean him to RA prior to discharge from [facility] program.  07-18-2024 attempt to wean his O2 to RA. He is not normally on home O2.  CKD stage 3b, GFR 30-44 ml/min (HCC) - baseline Scr 1.8-2.0 07-16-2024 baseline Scr 1.8-2.0. monitoring Scr during diuresis. Off ARB for now during diuresis.  07-17-2024 Scr 1.90 today. Stable.  07-18-2024 awaiting AM labs.  Paroxysmal atrial fibrillation (HCC) 07-16-2024 on amiodarone  200 mg daily and eliquis  2.5 mg bid.  07-17-2024 continue amio and eliquis .  Type II diabetes mellitus, well controlled (HCC) 07-16-2024 stable. On lantus  6 units daily.  07-17-2024 pt has CGM and uses it to monitor his glucose. CBG 171 this AM.  07-18-2024 stable.  Coronary artery disease 07-16-2024 stable. On lipitor 40 mg daily. On eliquis  2.5 mg bid.  07-17-2024 stable.  07-18-2024 stable.  Essential hypertension, benign 07-16-2024 on IV lasix . Holding ARB to allow more room for diuresis and to avoid AKI.  07-17-2024 BP stable off ARB.  Will restart after he has been adequately diuresed.  07-18-2024 stable.  Hypokalemia-resolved as of 07/18/2024 07-16-2024 given 40 meq po KCL yesterday and is on daily replacement. Check BMP. May need higher daily dose during aggressive diuresis.  07-17-2024 resolved. K of 4.8 today.  DVT prophylaxis: apixaban  (ELIQUIS ) tablet 2.5 mg Start: 07/14/24 2200 apixaban  (ELIQUIS ) tablet 2.5 mg     Code Status: Limited: Do not attempt resuscitation (DNR) -DNR-LIMITED -Do Not Intubate/DNI  Family Communication: pt is decisional. No family available during video  call Disposition Plan: home Reason for continuing need for hospitalization: remains on IV lasix .  Objective: Vitals:   07/18/24 0126 07/18/24 0335 07/18/24 0535 07/18/24 1000  BP:      Pulse: (!) 51 (!) 58 (!) 53   Resp: 14 19 19    Temp:      TempSrc:      SpO2: 99% 100% 98%   Weight:    65.1 kg  Height:       Weight Information (since admission)     Date/Time Weight Weight in lbs BSA (Calculated - sq m) BMI (Calculated) Who   07/18/24 1000 65.1 kg 143.6 lbs -- 21.84 AB   07/17/24 1800 67 kg 147.7 lbs -- 22.46 SH   07/17/24 0900 67.1 kg 148 lbs -- 22.51 AB   07/16/24 1700 67.4 kg 148.5 lbs -- 22.58 SH   07/16/24 0900 67.1 kg 147.9 lbs -- 22.49 [facility]   07/15/24 1900 68 kg 150 lbs -- 22.81 DK   07/15/24 0500 66.5 kg 146.7 lbs -- 22.31 GR  07/14/24 1942 69.6 kg 153.5 lbs 1.83 sq meters 23.34 GR   07/14/24 0945 68.5 kg 151 lbs 1.83 sq meters 22.29 AN      Examination: Note that physical examination performed by paramedic on-scene and documented by provider Video Exam performed by video enabled technology  Physical Exam Vitals and nursing note reviewed.  Constitutional:      General: He is not in acute distress.    Appearance: He is not toxic-appearing or diaphoretic.  HENT:     Head: Normocephalic and atraumatic.  Cardiovascular:     Rate and Rhythm: Normal rate and regular rhythm.  Pulmonary:     Effort: Pulmonary effort is normal. No respiratory distress.     Breath sounds: Normal breath sounds. No wheezing.  Abdominal:     General: Bowel sounds are normal.     Palpations: Abdomen is soft.  Musculoskeletal:     Right lower leg: Edema present.     Left lower leg: Edema present.     Comments: +2 pitting edema of dorsum of both feet and ankles.  Skin:    General: Skin is warm and dry.     Capillary Refill: Capillary refill takes less than 2 seconds.  Neurological:     Mental Status: He is alert and oriented to person, place, and time.     Data Reviewed: I have  personally reviewed following labs and imaging studies  CBC: Recent Labs  Lab 07/14/24 0951 07/15/24 0224 07/16/24 1033 07/17/24 1002  WBC 11.6* 8.6 9.7 9.6  HGB 11.2* 10.0* 10.9* 11.3*  HCT 33.5* 30.1* 33.0* 34.2*  MCV 93.6 94.1 94.3 94.2  PLT 196 193 221 258   Basic Metabolic Panel: Recent Labs  Lab 07/14/24 0951 07/15/24 0224 07/16/24 1033 07/17/24 1002  NA 141 139 139 138  K 4.2 3.4* 4.7 4.8  CL 104 104 104 101  CO2 21* 24 25 25   GLUCOSE 178* 153* 170* 171*  BUN 49* 45* 50* 52*  CREATININE 1.88* 1.83* 1.96* 1.90*  CALCIUM  9.1 8.4* 8.7* 8.9  MG  --  2.0 2.1 2.2   GFR: Estimated Creatinine Clearance: 23.8 mL/min (A) (by C-G formula based on SCr of 1.9 mg/dL (H)). CBG: Recent Labs  Lab 07/14/24 1641 07/14/24 2106 07/15/24 0639 07/15/24 1116 07/15/24 1350  GLUCAP 164* 218* 117* 194* 203*   Recent Results (from the past 240 hours)  Resp panel by RT-PCR (RSV, Flu A&B, Covid) Anterior Nasal Swab     Status: None   Collection Time: 07/14/24 10:47 AM   Specimen: Anterior Nasal Swab  Result Value Ref Range Status   SARS Coronavirus 2 by RT PCR NEGATIVE NEGATIVE Final    Comment: (NOTE) SARS-CoV-2 target nucleic acids are NOT DETECTED.  The SARS-CoV-2 RNA is generally detectable in upper respiratory specimens during the acute phase of infection. The lowest concentration of SARS-CoV-2 viral copies this assay can detect is 138 copies/mL. A negative result does not preclude SARS-Cov-2 infection and should not be used as the sole basis for treatment or other patient management decisions. A negative result may occur with  improper specimen collection/handling, submission of specimen other than nasopharyngeal swab, presence of viral mutation(s) within the areas targeted by this assay, and inadequate number of viral copies(<138 copies/mL). A negative result must be combined with clinical observations, patient history, and epidemiological information. The expected  result is Negative.  Fact Sheet for Patients:  BloggerCourse.com  Fact Sheet for Healthcare Providers:  SeriousBroker.it  This test is no t  yet approved or cleared by the [place]  FDA and  has been authorized for detection and/or diagnosis of SARS-CoV-2 by FDA under an Emergency Use Authorization (EUA). This EUA will remain  in effect (meaning this test can be used) for the duration of the COVID-19 declaration under Section 564(b)(1) of the Act, 21 U.S.C.section 360bbb-3(b)(1), unless the authorization is terminated  or revoked sooner.       Influenza A by PCR NEGATIVE NEGATIVE Final   Influenza B by PCR NEGATIVE NEGATIVE Final    Comment: (NOTE) The Xpert Xpress SARS-CoV-2/FLU/RSV plus assay is intended as an aid in the diagnosis of influenza from Nasopharyngeal swab specimens and should not be used as a sole basis for treatment. Nasal washings and aspirates are unacceptable for Xpert Xpress SARS-CoV-2/FLU/RSV testing.  Fact Sheet for Patients: BloggerCourse.com  Fact Sheet for Healthcare Providers: SeriousBroker.it  This test is not yet approved or cleared by the [place]  FDA and has been authorized for detection and/or diagnosis of SARS-CoV-2 by FDA under an Emergency Use Authorization (EUA). This EUA will remain in effect (meaning this test can be used) for the duration of the COVID-19 declaration under Section 564(b)(1) of the Act, 21 U.S.C. section 360bbb-3(b)(1), unless the authorization is terminated or revoked.     Resp Syncytial Virus by PCR NEGATIVE NEGATIVE Final    Comment: (NOTE) Fact Sheet for Patients: BloggerCourse.com  Fact Sheet for Healthcare Providers: SeriousBroker.it  This test is not yet approved or cleared by the [place]  FDA and has been authorized for detection and/or diagnosis of  SARS-CoV-2 by FDA under an Emergency Use Authorization (EUA). This EUA will remain in effect (meaning this test can be used) for the duration of the COVID-19 declaration under Section 564(b)(1) of the Act, 21 U.S.C. section 360bbb-3(b)(1), unless the authorization is terminated or revoked.  Performed at [facility] [address] [place], [place] [address]     Scheduled Meds:  amiodarone   200 mg Oral Daily   amLODipine   10 mg Oral Daily   apixaban   2.5 mg Oral BID   atorvastatin   40 mg Oral QHS   furosemide   40 mg Intravenous Q12H   insulin  glargine  6 Units Subcutaneous QHS   potassium chloride   40 mEq Oral Daily   Continuous Infusions:   LOS: 4 days   Time spent: 55 minutes  Camellia Door, DO  [facility]  07/18/2024, 10:58 AM

## 2024-07-18 NOTE — Progress Notes (Signed)
 Phone call completed with patient. He denies any pain or discomfort at this time. Patient states he had a good supper and was resting on the couch. Plan made to administer bedtime medications and patient is agreeable.

## 2024-07-18 NOTE — Plan of Care (Signed)
  Problem: Fluid Volume: Goal: Ability to maintain a balanced intake and output will improve Outcome: Progressing   Problem: Health Behavior/Discharge Planning: Goal: Ability to manage health-related needs will improve Outcome: Progressing   Problem: Metabolic: Goal: Ability to maintain appropriate glucose levels will improve Outcome: Progressing   Problem: Nutritional: Goal: Progress toward achieving an optimal weight will improve Outcome: Progressing   Problem: Skin Integrity: Goal: Risk for impaired skin integrity will decrease Outcome: Progressing   Problem: Tissue Perfusion: Goal: Adequacy of tissue perfusion will improve Outcome: Progressing   Problem: Health Behavior/Discharge Planning: Goal: Ability to manage health-related needs will improve Outcome: Progressing   Problem: Clinical Measurements: Goal: Ability to maintain clinical measurements within normal limits will improve Outcome: Progressing Goal: Will remain free from infection Outcome: Progressing Goal: Diagnostic test results will improve Outcome: Progressing Goal: Respiratory complications will improve Outcome: Progressing Goal: Cardiovascular complication will be avoided Outcome: Progressing   Problem: Pain Managment: Goal: General experience of comfort will improve and/or be controlled Outcome: Progressing   Problem: Safety: Goal: Ability to remain free from injury will improve Outcome: Progressing   Problem: Skin Integrity: Goal: Risk for impaired skin integrity will decrease Outcome: Progressing   Problem: Activity: Goal: Capacity to carry out activities will improve Outcome: Progressing   Problem: Cardiac: Goal: Ability to achieve and maintain adequate cardiopulmonary perfusion will improve Outcome: Progressing   Problem: Education: Goal: Ability to demonstrate management of disease process will improve Outcome: Progressing Goal: Ability to verbalize understanding of medication  therapies will improve Outcome: Progressing   Problem: Activity: Goal: Capacity to carry out activities will improve Outcome: Progressing   Problem: Cardiac: Goal: Ability to achieve and maintain adequate cardiopulmonary perfusion will improve Outcome: Progressing

## 2024-07-18 NOTE — Progress Notes (Signed)
 Received call from patient's daughter Peter Scott . According to her ,patients primary MD wants to see patient in his office before the ERCP and can't see patient til Tuesday. Information passed onto the team and she is aware the team will discuss with her in am

## 2024-07-18 NOTE — Progress Notes (Signed)
 PM rounding completed by paramedic Richerd Batty and EMT Gaylan Rattler. Pt in good spirits, CBG 140's per dexcom, appetite good. Skin tear on left arm assessed and dressing changed, pt states he hit his arm on a pole PTA.

## 2024-07-19 LAB — BASIC METABOLIC PANEL WITH GFR
Anion gap: 12 (ref 5–15)
BUN: 62 mg/dL — ABNORMAL HIGH (ref 8–23)
CO2: 24 mmol/L (ref 22–32)
Calcium: 8.7 mg/dL — ABNORMAL LOW (ref 8.9–10.3)
Chloride: 100 mmol/L (ref 98–111)
Creatinine, Ser: 2.04 mg/dL — ABNORMAL HIGH (ref 0.61–1.24)
GFR, Estimated: 30 mL/min — ABNORMAL LOW (ref >60)
Glucose, Bld: 185 mg/dL — ABNORMAL HIGH (ref 70–99)
Potassium: 4.6 mmol/L (ref 3.5–5.1)
Sodium: 136 mmol/L (ref 135–145)

## 2024-07-19 LAB — MAGNESIUM: Magnesium: 2 mg/dL (ref 1.7–2.4)

## 2024-07-19 MED ORDER — FUROSEMIDE 40 MG PO TABS
40.0000 mg | ORAL_TABLET | Freq: Once | ORAL | Status: AC
  Administered 2024-07-19: 40 mg via ORAL
  Filled 2024-07-19: qty 1

## 2024-07-19 MED ORDER — POTASSIUM CHLORIDE CRYS ER 20 MEQ PO TBCR
20.0000 meq | EXTENDED_RELEASE_TABLET | Freq: Every day | ORAL | 0 refills | Status: DC
  Filled 2024-07-19 – 2024-07-22 (×2): qty 30, 30d supply, fill #0
  Filled 2024-08-19: qty 30, 30d supply, fill #1

## 2024-07-19 MED ORDER — FUROSEMIDE 40 MG PO TABS
40.0000 mg | ORAL_TABLET | Freq: Two times a day (BID) | ORAL | 0 refills | Status: DC
  Filled 2024-07-19: qty 180, 90d supply, fill #0

## 2024-07-19 NOTE — Progress Notes (Signed)
 Discussed at length intake of food high in potassium as patient continuing on Lasix .

## 2024-07-19 NOTE — Progress Notes (Addendum)
 0830 Virtual call with patient after identification complete assessed patient for current health flag. Patient denies distress, dizziness, chest pain or pressure. Conversation full sentences no breathiness. He did report sleeping when I called. Night RN reported frequent pulse readings in the 40-50 range and patient endorses this is his baseline. He does share he needs rest and when he sleeps his pulse frequently is at 40-40. Requested to let him sleep for a bit. I educated as long as parameters stay in his range but should they go out of range for multiple readings I would need to cal to be sure he was safe. Did want to talk about incentive spirometer use educated and selected information to send in print on how to use and the purpose. All questions answered to patients satisfaction and he is expecting to discharge today.

## 2024-07-19 NOTE — Progress Notes (Addendum)
 1011 Virtual visit with Charity fundraiser, provide, and Tax inspector. Discussing fluid intake should be around 60-80 oz daily. No Weight change. LLE edema +1. Holding lasix  till labs back watching kidney function. Patient identification completed medic administered dual signed for amlodipine  and potassium. Walking-sitting O2 Sats patient went down to 88% after ambulating while sitting. Provider requested he get in to see Dr Ubaldo after discharge for labs to check kidney function. Holiday weekend office closed.Patient agreed.    SATURATION QUALIFICATIONS: (This note is used to comply with regulatory documentation for home oxygen)  Patient Saturations on Room Air at Rest = 88%  Patient Saturations on Room Air while Ambulating = 93%  Patient Saturations on 1 Liters of oxygen while Ambulating = 96%  Please briefly explain why patient needs home oxygen:  After sitting or after ambulating and sitting without 1LO2 [place] saturation decreases to 88%.

## 2024-07-19 NOTE — Progress Notes (Signed)
 Arrived to the PT's house to find the PT greeting providers. PT is alert and oriented to his baseline. PT stated that he slept well last night other than a few phone calls from RN Dawn due to current health alerts. PT stated that he had the provided oatmeal for breakfast.   Physical exam showed negative DCAPBTLS to the head, neck, or face. PT does have to clear his throat throughout the visit. Negative JVD or tracheal deviation. Chest expansion is equal with non-labored breathing. L/S were C/E in all fields. ABD is soft, non-tender. Bilateral +1 pitting edema in both feet, legs are non-pitting.   PT's vitals were obtained and documented accordingly. IV lasix  was held by MD Laurence. IV was patent and flushed with a new curos cap placed. Blood work was drawn and brought to the lab.   Ambulatory saturation monitoring was performed and were the following.  - Sitting with O2: 94-95% - Walking with O2: 96% - Sitting without O2: 88% - Walking without O2- 92-93%  PT was informed of immanent d/c today following lab work. PT denied any questions and was informed that if he needed anything else to reach out to the virtual RN.

## 2024-07-19 NOTE — Progress Notes (Signed)
 [facility] PROGRESS NOTE    Peter Scott  FMW:[id number] DOB: Mar 24, 1932 DOA: 07/14/2024 PCP: Watt Harlene BROCKS, MD  Subjective: Pt seen during video visit. Vickie (paramedic) is physically at pt's home and is performing exam.  Pitting edema has improved. Still hypoxic with ambulation. Needing 1 L/min O2.  BUN/Scr up to 62 and 2.04.  IV lasix  held this AM.  Pt is ambulating well and his breathing is much improved.   Provider Location: [place], [place] Patient examined by: Vickie(paramedic)  Hospital Course: CC: bilateral LE edema, DOE x 5 days HPI: Peter Scott is a 89 y.o. male with medical history significant for hypertension, type 2 diabetes mellitus, CAD status post CABG, history of TAVR, atrial fibrillation on Eliquis , chronic HFpEF, and pulmonary hypertension who presents with progressive SOB, orthopnea, swelling, and cough.    Patient reports that he began developing shortness of breath and increased swelling roughly a week ago and has had a lot of difficulty performing his usual activities since 07/09/2024 due to dyspnea.  He has had trouble sleeping due to orthopnea and cough.  He reports strict adherence with his medications.   [facility] ED Course: Upon arrival to the ED, patient is found to be afebrile and saturating 90% on room air while at rest with tachypnea, normal HR, and stable BP.  Labs are most notable for creatinine 1.88, troponin 73, and proBNP 4291.  Chest x-ray reveals bibasilar edema or atelectasis with minimal pleural effusions.   Patient was treated with IV Lasix  and transferred to [facility] for admission.  Significant Events: Admitted 07/14/2024 acute on chronic diastolic CHF   Admission Labs: WBC 11.6, HgB 11.2, plt 196 Pro-BNP 4291 Na 141, K 4.2, CO2 of 21, BUN 49, Scr 1.88, glu 178 Covid/RSV/flu Negative  Admission Imaging Studies: CXR Increased bibasilar interstitial densities concerning for possible edema or atelectasis. Minimal  pleural effusions are noted.  Significant Labs:   Significant Imaging Studies:   Antibiotic Therapy: Anti-infectives (From admission, onward)    None       Procedures:   Consultants:     Assessment and Plan: * Acute on chronic heart failure with preserved ejection fraction (HFpEF) (HCC) 07-16-2024 continue with IV lasix  40 mg bid. Holding ARB to give SBP and Scr more room for diuresis without causing injury to his already decreased renal function(CKD 3b). Monitor his daily weights.  **update. Today's BUN/Scr 50/1.96.  yesterday, BUN/Scr 45/1.83. continue with IV lasix . Repeat labs in AM to monitor diuresis. Weight today was 147.9 lbs. Will see what he weighs tomorrow.  07-17-2024 BUN/Scr 52 and 1.90 today.  Was 50/1.96 today. Pt still with +2 pitting edema of dorsum of feet. Continue with IV lasix  for diuresis. Weight 148 lbs today. No change in his weight.  07-18-2024 awaiting labs from today.  Still with +2 pitting pedal edema. He states his edema is improving. Continue with IV lasix  for diuresis until he is able to wean off O2 completely and/or his renal function starts to decline. Will tentatively keep him in the program 1 more day. Possible DC tomorrow. Weight down to 143.6 lbs. Yesterday weight was 148 lbs. On admission, weight was 153.5 lbs. He is down nearly 10 lbs.  07-19-2024 Pitting edema has improved. Still hypoxic with ambulation. Needing 1 L/min O2. BUN/Scr up to 62 and 2.04.  IV lasix  held this AM.  Will change to PO lasix  40 mg bid. Plan for DC today from [facility]. Pt will need f/u BMP this  week with PCP office to monitor his Scr while on 40 mg po bid lasix . Pt has appt with cardiology on August 19, 2024.  Would only decrease lasix  to 20 mg bid if his renal function takes a significant decline. (I.e. Scr >=2.5 or BUN >=80)    Weight Information (since admission)     Date/Time Weight Weight in lbs BSA (Calculated - sq m) BMI (Calculated) Who    07/18/24 1000 65.1 kg 143.6 lbs -- 21.84 AB   07/17/24 1800 67 kg 147.7 lbs -- 22.46 SH   07/17/24 0900 67.1 kg 148 lbs -- 22.51 AB   07/16/24 1700 67.4 kg 148.5 lbs -- 22.58 SH   07/16/24 0900 67.1 kg 147.9 lbs -- 22.49 [facility]   07/15/24 1900 68 kg 150 lbs -- 22.81 DK   07/15/24 0500 66.5 kg 146.7 lbs -- 22.31 GR   07/14/24 1942 69.6 kg 153.5 lbs 1.83 sq meters 23.34 GR   07/14/24 0945 68.5 kg 151 lbs 1.83 sq meters 22.29 AN        Acute respiratory failure with hypoxia (HCC) 07-16-2024 due to his pulmonary edema from CHF exacerbation. Wean O2 as tolerated. Will continue to ambulate him each day and wean O2 as needed. Pt was not on home O2 prior to admission. Hopefully he will not need home O2 at discharge.  07-17-2024 remains on supplemental O2. Will attempt to wean him to RA prior to discharge from [facility] program.  07-18-2024 attempt to wean his O2 to RA. He is not normally on home O2.  07-19-2024 O2 sats dropped to 88% on RA while ambulating. Will remain on 1 L/min O2. Home O2 will be arranged.  CKD stage 3b, GFR 30-44 ml/min (HCC) - baseline Scr 1.8-2.0 07-16-2024 baseline Scr 1.8-2.0. monitoring Scr during diuresis. Off ARB for now during diuresis.  07-17-2024 Scr 1.90 today. Stable.  07-18-2024 awaiting AM labs.  07-19-2024 BUN/Scr 62/2.04. this is the upper limits of his normal. Will stop IV diuresis to avoid an AKI. Change to po lasix  40 mg bid. Have BMP checked at PCP office this week to monitor renal function.  Would only decrease lasix  to 20 mg bid if his renal function takes a significant decline. (I.e. Scr >=2.5 or BUN >=80)  Paroxysmal atrial fibrillation (HCC) 07-16-2024 on amiodarone  200 mg daily and eliquis  2.5 mg bid.  07-17-2024 continue amio and eliquis .  07-19-2024 stable. Continue amio and eliquis .  Type II diabetes mellitus, well controlled (HCC) 07-16-2024 stable. On lantus  6 units daily.  07-17-2024 pt has CGM and uses it to monitor his  glucose. CBG 171 this AM.  07-18-2024 stable.  07-19-2024 stable. Continue lantus  at home.  Coronary artery disease 07-16-2024 stable. On lipitor 40 mg daily. On eliquis  2.5 mg bid.  07-17-2024 stable.  07-18-2024 stable.  07-19-2024 stable  Essential hypertension, benign 07-16-2024 on IV lasix . Holding ARB to allow more room for diuresis and to avoid AKI.  07-17-2024 BP stable off ARB.  Will restart after he has been adequately diuresed.  07-18-2024 stable.  07-19-2024 keep him off ARB while he is on lasix  40 mg bid. Hopefully his ARB can be restarted once he sees his cardiologist on Aug 19, 2024. Have BMP checked at PCP office this week to monitor renal function. Would only decrease lasix  to 20 mg bid if his renal function takes a significant decline. (I.e. Scr >=2.5 or BUN >=80)   Hypokalemia-resolved as of 07/18/2024 07-16-2024 given 40 meq po KCL yesterday and  is on daily replacement. Check BMP. May need higher daily dose during aggressive diuresis.  07-17-2024 resolved. K of 4.8 today.   DVT prophylaxis: apixaban  (ELIQUIS ) tablet 2.5 mg Start: 07/14/24 2200 apixaban  (ELIQUIS ) tablet 2.5 mg     Code Status: Limited: Do not attempt resuscitation (DNR) -DNR-LIMITED -Do Not Intubate/DNI  Family Communication: no family present during video visit Disposition Plan: home Reason for continuing need for hospitalization: stable for DC.  Objective: Vitals:   07/18/24 1000 07/18/24 1800 07/19/24 1035 07/19/24 1045  BP: (!) 157/62 (!) 168/87    Pulse: (!) 57 (!) 54    Resp: 18 14    Temp:  (!) 97.5 F (36.4 C)    TempSrc:  Oral    SpO2: 95% 97% (!) 88% 96%  Weight: 65.1 kg     Height:       No intake or output data in the 24 hours ending 07/19/24 1157 Filed Weights   07/17/24 0900 07/17/24 1800 07/18/24 1000  Weight: 67.1 kg 67 kg 65.1 kg    Examination: Note that physical examination performed by paramedic on-scene and documented by provider Video Exam performed by  video enabled technology  Physical Exam Vitals and nursing note reviewed.  Constitutional:      General: He is not in acute distress.    Appearance: He is not toxic-appearing or diaphoretic.  Eyes:     General: No scleral icterus. Cardiovascular:     Rate and Rhythm: Normal rate.  Pulmonary:     Effort: Pulmonary effort is normal. No respiratory distress.     Breath sounds: Normal breath sounds.  Abdominal:     General: Abdomen is flat. Bowel sounds are normal.     Palpations: Abdomen is soft.  Musculoskeletal:     Comments: +1-2 pitting bilateral dorsal pedal edema  Skin:    General: Skin is warm and dry.     Capillary Refill: Capillary refill takes less than 2 seconds.  Neurological:     General: No focal deficit present.     Mental Status: He is alert and oriented to person, place, and time.     Data Reviewed: I have personally reviewed following labs and imaging studies  CBC: Recent Labs  Lab 07/14/24 0951 07/15/24 0224 07/16/24 1033 07/17/24 1002  WBC 11.6* 8.6 9.7 9.6  HGB 11.2* 10.0* 10.9* 11.3*  HCT 33.5* 30.1* 33.0* 34.2*  MCV 93.6 94.1 94.3 94.2  PLT 196 193 221 258   Basic Metabolic Panel: Recent Labs  Lab 07/15/24 0224 07/16/24 1033 07/17/24 1002 07/18/24 1029 07/19/24 0800  NA 139 139 138 139 136  K 3.4* 4.7 4.8 4.7 4.6  CL 104 104 101 103 100  CO2 24 25 25 25 24   GLUCOSE 153* 170* 171* 162* 185*  BUN 45* 50* 52* 63* 62*  CREATININE 1.83* 1.96* 1.90* 1.96* 2.04*  CALCIUM  8.4* 8.7* 8.9 8.7* 8.7*  MG 2.0 2.1 2.2  --  2.0   GFR: Estimated Creatinine Clearance: 22.2 mL/min (A) (by C-G formula based on SCr of 2.04 mg/dL (H)).  CBG: Recent Labs  Lab 07/14/24 1641 07/14/24 2106 07/15/24 0639 07/15/24 1116 07/15/24 1350  GLUCAP 164* 218* 117* 194* 203*   Recent Results (from the past 240 hours)  Resp panel by RT-PCR (RSV, Flu A&B, Covid) Anterior Nasal Swab     Status: None   Collection Time: 07/14/24 10:47 AM   Specimen: Anterior  Nasal Swab  Result Value Ref Range Status   SARS Coronavirus 2  by RT PCR NEGATIVE NEGATIVE Final    Comment: (NOTE) SARS-CoV-2 target nucleic acids are NOT DETECTED.  The SARS-CoV-2 RNA is generally detectable in upper respiratory specimens during the acute phase of infection. The lowest concentration of SARS-CoV-2 viral copies this assay can detect is 138 copies/mL. A negative result does not preclude SARS-Cov-2 infection and should not be used as the sole basis for treatment or other patient management decisions. A negative result may occur with  improper specimen collection/handling, submission of specimen other than nasopharyngeal swab, presence of viral mutation(s) within the areas targeted by this assay, and inadequate number of viral copies(<138 copies/mL). A negative result must be combined with clinical observations, patient history, and epidemiological information. The expected result is Negative.  Fact Sheet for Patients:  BloggerCourse.com  Fact Sheet for Healthcare Providers:  SeriousBroker.it  This test is no t yet approved or cleared by the [place]  FDA and  has been authorized for detection and/or diagnosis of SARS-CoV-2 by FDA under an Emergency Use Authorization (EUA). This EUA will remain  in effect (meaning this test can be used) for the duration of the COVID-19 declaration under Section 564(b)(1) of the Act, 21 U.S.C.section 360bbb-3(b)(1), unless the authorization is terminated  or revoked sooner.       Influenza A by PCR NEGATIVE NEGATIVE Final   Influenza B by PCR NEGATIVE NEGATIVE Final    Comment: (NOTE) The Xpert Xpress SARS-CoV-2/FLU/RSV plus assay is intended as an aid in the diagnosis of influenza from Nasopharyngeal swab specimens and should not be used as a sole basis for treatment. Nasal washings and aspirates are unacceptable for Xpert Xpress SARS-CoV-2/FLU/RSV testing.  Fact Sheet for  Patients: BloggerCourse.com  Fact Sheet for Healthcare Providers: SeriousBroker.it  This test is not yet approved or cleared by the [place]  FDA and has been authorized for detection and/or diagnosis of SARS-CoV-2 by FDA under an Emergency Use Authorization (EUA). This EUA will remain in effect (meaning this test can be used) for the duration of the COVID-19 declaration under Section 564(b)(1) of the Act, 21 U.S.C. section 360bbb-3(b)(1), unless the authorization is terminated or revoked.     Resp Syncytial Virus by PCR NEGATIVE NEGATIVE Final    Comment: (NOTE) Fact Sheet for Patients: BloggerCourse.com  Fact Sheet for Healthcare Providers: SeriousBroker.it  This test is not yet approved or cleared by the [place]  FDA and has been authorized for detection and/or diagnosis of SARS-CoV-2 by FDA under an Emergency Use Authorization (EUA). This EUA will remain in effect (meaning this test can be used) for the duration of the COVID-19 declaration under Section 564(b)(1) of the Act, 21 U.S.C. section 360bbb-3(b)(1), unless the authorization is terminated or revoked.  Performed at [name], [address] [place], [place] [address]     Scheduled Meds:  amiodarone   200 mg Oral Daily   amLODipine   10 mg Oral Daily   apixaban   2.5 mg Oral BID   atorvastatin   40 mg Oral QHS   furosemide   40 mg Intravenous Daily   furosemide   40 mg Intravenous QPM   insulin  glargine  6 Units Subcutaneous QHS   potassium chloride   40 mEq Oral Daily   Continuous Infusions:   LOS: 5 days   Time spent: 55 minutes  Camellia Door, DO  [facility]  07/19/2024, 11:57 AM

## 2024-07-19 NOTE — TOC Transition Note (Addendum)
 Transition of Care [facility]) - Discharge Note   Patient Details  Name: Peter Scott MRN: [id number] Date of Birth: Dec 30, 1931  [facility] CM/SW Contact:  Corean JAYSON Canary, RN Phone Number: 07/19/2024, 11:33 AM   Clinical Narrative:     Patient had ambulatory testing for oxygen, qualifies and oxygen order placed by MD. Florence Evan and they will deliver prior to DC Jermaine from [place] will notify this RNCM when oxygen delivery done. Spoke to patient via phone about delivery and to purchase a pulse oximeter and document trends for his PCP, who he will be seeing in 7-10 days. He is hopeful that he can wean off quickly. SABRA He states he has no other needs.  He will be discharged after oxygen delivery today  Final next level of care: Home/Self Care Barriers to Discharge: No Barriers Identified   Patient Goals and CMS Choice      Home self care      Discharge Placement                       Discharge Plan and Services Additional resources added to the After Visit Summary for                  DME Arranged: Oxygen DME Agency: [employer] Date DME Agency Contacted: 07/19/24 Time DME Agency Contacted: 1131 Representative spoke with at DME Agency: [place]            Social Drivers of Health (SDOH) Interventions SDOH Screenings   Food Insecurity: No Food Insecurity (07/14/2024)  Housing: Low Risk  (07/14/2024)  Transportation Needs: No Transportation Needs (07/14/2024)  Utilities: Not At Risk (07/14/2024)  Alcohol Screen: Low Risk  (06/28/2021)  Depression (PHQ2-9): Low Risk  (11/15/2022)  Financial Resource Strain: Low Risk  (09/08/2021)   Received from [facility]  Physical Activity: Sufficiently Active (09/08/2021)   Received from [facility]  Social Connections: Moderately Integrated (07/14/2024)  Stress: No Stress Concern Present (09/08/2021)   Received from [facility]  Tobacco Use: Medium Risk (07/14/2024)     Readmission Risk  Interventions     No data to display

## 2024-07-19 NOTE — Discharge Instr - Supplementary Instructions (Signed)
       Overview Fruits rich in potassium include: Bananas: One medium banana contains about 422 mg of potassium.  Avocados: Half an avocado contains about 345 mg of potassium.  Cantaloupe: One cup of cantaloupe contains about 215 mg of potassium. Honeydew melon: One cup of honeydew melon contains about 200 mg of potassium.  Apricots: One cup of dried apricots contains about 1511 mg of potassium.  Raisins: One cup of raisins contains about 1231 mg of potassium.  Dates: One date contains about 167 mg of potassium.  Oranges: One medium orange contains about 240 mg of potassium.  Grapefruit: One grapefruit contains about 150 mg of potassium.  Kiwi: One kiwi contains about 240 mg of potassium.

## 2024-07-19 NOTE — Discharge Summary (Signed)
 [facility] Physician Discharge Summary   Patient name: Peter Scott  Admit date:     07/14/2024  Discharge date: 07/19/2024  Attending Physician: PERRI MICKEY DELENA MELITON [[id number]]  Discharge Physician: Camellia Door   PCP: Watt Harlene BROCKS, MD  Admitted From: Home  Disposition:  Home  Recommendations for Outpatient Follow-up:  Follow up with PCP in 1-2 weeks Will need repeat BMP this week to monitor renal function.  Would only decrease lasix  to 20 mg bid if his renal function takes a significant decline. (I.e. Scr >=2.5 or BUN >=80)   Home Health:No Equipment/Devices: Oxygen 1 L/min  Discharge Condition:Stable CODE STATUS:DNR/DNI Diet recommendation: Renal/Diabetic Fluid Restriction: 1800 ml/day  Hospital Summary: CC: bilateral LE edema, DOE x 5 days HPI: [name] is a 89 y.o. male with medical history significant for hypertension, type 2 diabetes mellitus, CAD status post CABG, history of TAVR, atrial fibrillation on Eliquis , chronic HFpEF, and pulmonary hypertension who presents with progressive SOB, orthopnea, swelling, and cough.    Patient reports that he began developing shortness of breath and increased swelling roughly a week ago and has had a lot of difficulty performing his usual activities since 07/09/2024 due to dyspnea.  He has had trouble sleeping due to orthopnea and cough.  He reports strict adherence with his medications.   [facility] ED Course: Upon arrival to the ED, patient is found to be afebrile and saturating 90% on room air while at rest with tachypnea, normal HR, and stable BP.  Labs are most notable for creatinine 1.88, troponin 73, and proBNP 4291.  Chest x-ray reveals bibasilar edema or atelectasis with minimal pleural effusions.   Patient was treated with IV Lasix  and transferred to [facility] for admission.  Significant Events: Admitted 07/14/2024 acute on chronic diastolic CHF   Admission Labs: WBC 11.6, HgB 11.2, plt 196 Pro-BNP  4291 Na 141, K 4.2, CO2 of 21, BUN 49, Scr 1.88, glu 178 Covid/RSV/flu Negative  Admission Imaging Studies: CXR Increased bibasilar interstitial densities concerning for possible edema or atelectasis. Minimal pleural effusions are noted.  Significant Labs:   Significant Imaging Studies:   Antibiotic Therapy: Anti-infectives (From admission, onward)    None       Procedures:   Consultants:    Hospital Course by Problem: * Acute on chronic heart failure with preserved ejection fraction (HFpEF) (HCC) 07-16-2024 continue with IV lasix  40 mg bid. Holding ARB to give SBP and Scr more room for diuresis without causing injury to his already decreased renal function(CKD 3b). Monitor his daily weights.  **update. Today's BUN/Scr 50/1.96.  yesterday, BUN/Scr 45/1.83. continue with IV lasix . Repeat labs in AM to monitor diuresis. Weight today was 147.9 lbs. Will see what he weighs tomorrow.  07-17-2024 BUN/Scr 52 and 1.90 today.  Was 50/1.96 today. Pt still with +2 pitting edema of dorsum of feet. Continue with IV lasix  for diuresis. Weight 148 lbs today. No change in his weight.  07-18-2024 awaiting labs from today.  Still with +2 pitting pedal edema. He states his edema is improving. Continue with IV lasix  for diuresis until he is able to wean off O2 completely and/or his renal function starts to decline. Will tentatively keep him in the program 1 more day. Possible DC tomorrow. Weight down to 143.6 lbs. Yesterday weight was 148 lbs. On admission, weight was 153.5 lbs. He is down nearly 10 lbs.  07-19-2024 Pitting edema has improved. Still hypoxic with ambulation. Needing 1 L/min O2. BUN/Scr up to  62 and 2.04.  IV lasix  held this AM.  Will change to PO lasix  40 mg bid. Plan for DC today from [facility]. Pt will need f/u BMP this week with PCP office to monitor his Scr while on 40 mg po bid lasix . Pt has appt with cardiology on August 19, 2024.  Would only decrease lasix  to  20 mg bid if his renal function takes a significant decline. (I.e. Scr >=2.5 or BUN >=80)    Weight Information (since admission)     Date/Time Weight Weight in lbs BSA (Calculated - sq m) BMI (Calculated) Who   07/18/24 1000 65.1 kg 143.6 lbs -- 21.84 AB   07/17/24 1800 67 kg 147.7 lbs -- 22.46 SH   07/17/24 0900 67.1 kg 148 lbs -- 22.51 AB   07/16/24 1700 67.4 kg 148.5 lbs -- 22.58 SH   07/16/24 0900 67.1 kg 147.9 lbs -- 22.49 [facility]   07/15/24 1900 68 kg 150 lbs -- 22.81 DK   07/15/24 0500 66.5 kg 146.7 lbs -- 22.31 GR   07/14/24 1942 69.6 kg 153.5 lbs 1.83 sq meters 23.34 GR   07/14/24 0945 68.5 kg 151 lbs 1.83 sq meters 22.29 AN        Acute respiratory failure with hypoxia (HCC) 07-16-2024 due to his pulmonary edema from CHF exacerbation. Wean O2 as tolerated. Will continue to ambulate him each day and wean O2 as needed. Pt was not on home O2 prior to admission. Hopefully he will not need home O2 at discharge.  07-17-2024 remains on supplemental O2. Will attempt to wean him to RA prior to discharge from [facility] program.  07-18-2024 attempt to wean his O2 to RA. He is not normally on home O2.  07-19-2024 O2 sats dropped to 88% on RA while ambulating. Will remain on 1 L/min O2. Home O2 will be arranged.  CKD stage 3b, GFR 30-44 ml/min (HCC) - baseline Scr 1.8-2.0 07-16-2024 baseline Scr 1.8-2.0. monitoring Scr during diuresis. Off ARB for now during diuresis.  07-17-2024 Scr 1.90 today. Stable.  07-18-2024 awaiting AM labs.  07-19-2024 BUN/Scr 62/2.04. this is the upper limits of his normal. Will stop IV diuresis to avoid an AKI. Change to po lasix  40 mg bid. Have BMP checked at PCP office this week to monitor renal function.  Would only decrease lasix  to 20 mg bid if his renal function takes a significant decline. (I.e. Scr >=2.5 or BUN >=80)  Paroxysmal atrial fibrillation (HCC) 07-16-2024 on amiodarone  200 mg daily and eliquis  2.5 mg bid.  07-17-2024 continue amio  and eliquis .  07-19-2024 stable. Continue amio and eliquis .  Type II diabetes mellitus, well controlled (HCC) 07-16-2024 stable. On lantus  6 units daily.  07-17-2024 pt has CGM and uses it to monitor his glucose. CBG 171 this AM.  07-18-2024 stable.  07-19-2024 stable. Continue lantus  at home.  Coronary artery disease 07-16-2024 stable. On lipitor 40 mg daily. On eliquis  2.5 mg bid.  07-17-2024 stable.  07-18-2024 stable.  07-19-2024 stable  Essential hypertension, benign 07-16-2024 on IV lasix . Holding ARB to allow more room for diuresis and to avoid AKI.  07-17-2024 BP stable off ARB.  Will restart after he has been adequately diuresed.  07-18-2024 stable.  07-19-2024 keep him off ARB while he is on lasix  40 mg bid. Hopefully his ARB can be restarted once he sees his cardiologist on Aug 19, 2024. Have BMP checked at PCP office this week to monitor renal function. Would only decrease lasix  to  20 mg bid if his renal function takes a significant decline. (I.e. Scr >=2.5 or BUN >=80)   Hypokalemia-resolved as of 07/18/2024 07-16-2024 given 40 meq po KCL yesterday and is on daily replacement. Check BMP. May need higher daily dose during aggressive diuresis.  07-17-2024 resolved. K of 4.8 today.    Discharge Diagnoses:  Principal Problem:   Acute on chronic heart failure with preserved ejection fraction (HFpEF) (HCC) Active Problems:   Acute respiratory failure with hypoxia (HCC)   Essential hypertension, benign   Coronary artery disease   Type II diabetes mellitus, well controlled (HCC)   Paroxysmal atrial fibrillation (HCC)   CKD stage 3b, GFR 30-44 ml/min (HCC) - baseline Scr 1.8-2.0   Discharge Instructions  Discharge Instructions     (HEART FAILURE PATIENTS) Call MD:  Anytime you have any of the following symptoms: 1) 3 pound weight gain in 24 hours or 5 pounds in 1 week 2) shortness of breath, with or without a dry hacking cough 3) swelling in the hands, feet or  stomach 4) if you have to sleep on extra pillows at night in order to breathe.   Complete by: As directed    Call MD for:  difficulty breathing, headache or visual disturbances   Complete by: As directed    Call MD for:  extreme fatigue   Complete by: As directed    Call MD for:  hives   Complete by: As directed    Call MD for:  persistant dizziness or light-headedness   Complete by: As directed    Call MD for:  persistant nausea and vomiting   Complete by: As directed    Call MD for:  redness, tenderness, or signs of infection (pain, swelling, redness, odor or green/yellow discharge around incision site)   Complete by: As directed    Call MD for:  severe uncontrolled pain   Complete by: As directed    Call MD for:  temperature >100.4   Complete by: As directed    Diet - low sodium heart healthy   Complete by: As directed    60 ounces per day fluid restriction   Discharge instructions   Complete by: As directed    1. Follow up with your primary care provider in 1-2 weeks following discharge from hospital. 2. Get repeat blood work to check your kidney function at Dr. Loetta office this week. 3. Keep your cardiology appointment with Dr. Pietro in October 2025.   Increase activity slowly   Complete by: As directed       Allergies as of 07/19/2024       Reactions   Glucotrol [glipizide] Palpitations, Other (See Comments)   Tachycardia         Medication List     PAUSE taking these medications    losartan  100 MG tablet Wait to take this until your doctor or other care provider tells you to start again. Hold until seen by your cardiologist Dr. Pietro on August 19, 2024. He will tell you when to restart it.  Commonly known as: COZAAR  Take 0.5 tablets (50 mg total) by mouth 2 (two) times daily. What changed:  how much to take when to take this       TAKE these medications    acetaminophen  500 MG tablet Commonly known as: TYLENOL  Take 500 mg by mouth every  6 (six) hours as needed for fever or headache (pain).   amiodarone  200 MG tablet Commonly known as: PACERONE  Take 1 tablet (  200 mg total) by mouth daily.   amLODipine  10 MG tablet Commonly known as: NORVASC  Take 10 mg by mouth daily before breakfast.   amoxicillin  500 MG capsule Commonly known as: AMOXIL  Take 4 capsules (2,000 mg total) by mouth one hour prior to dental procedure.   apixaban  2.5 MG Tabs tablet Commonly known as: ELIQUIS  Take 1 tablet (2.5 mg total) by mouth 2 (two) times daily.   atorvastatin  40 MG tablet Commonly known as: LIPITOR Take 40 mg by mouth at bedtime.   Contour Next Test test strip Generic drug: glucose blood Use once a day to check blood sugar.  Dx code: E11.9   furosemide  40 MG tablet Commonly known as: LASIX  Take 1 tablet (40 mg total) by mouth 2 (two) times daily. What changed: when to take this   potassium chloride  SA 20 MEQ tablet Commonly known as: KLOR-CON  M Take 1 tablet (20 mEq total) by mouth daily. Start taking on: July 20, 2024   Semglee  (yfgn) 100 UNIT/ML Pen Generic drug: insulin  glargine Inject 11 Units into the skin at bedtime.               Durable Medical Equipment  (From admission, onward)           Start     Ordered   07/19/24 1016  For home use only DME oxygen  Once       Question Answer Comment  Length of Need 12 Months   Mode or (Route) Nasal cannula   Liters per Minute 1   Frequency Continuous (stationary and portable oxygen unit needed)   Oxygen conserving device Yes   Oxygen delivery system Gas      07/19/24 1015            Follow-up Information     [facility] Follow up.   Why: They will call you to set up oxygen        Copland, Jessica C, MD. Schedule an appointment as soon as possible for a visit in 3 day(s).   Specialty: Family Medicine Why: to have blood work performed. See her in clinic in 1-2 weeks. Contact information: [address] [place] [address] [phone]                Allergies  Allergen Reactions   Glucotrol [Glipizide] Palpitations and Other (See Comments)    Tachycardia     Discharge Exam: Vitals:   07/19/24 1035 07/19/24 1045  BP:    Pulse:    Resp:    Temp:    SpO2: (!) 88% 96%    Physical Exam Vitals and nursing note reviewed.  Constitutional:      General: He is not in acute distress.    Appearance: He is not toxic-appearing or diaphoretic.  Eyes:     General: No scleral icterus. Cardiovascular:     Rate and Rhythm: Normal rate.  Pulmonary:     Effort: Pulmonary effort is normal. No respiratory distress.     Breath sounds: Normal breath sounds.  Abdominal:     General: Abdomen is flat. Bowel sounds are normal.     Palpations: Abdomen is soft.  Musculoskeletal:     Comments: +1-2 pitting bilateral dorsal pedal edema  Skin:    General: Skin is warm and dry.     Capillary Refill: Capillary refill takes less than 2 seconds.  Neurological:     General: No focal deficit present.     Mental Status:  He is alert and oriented to person, place, and time.     The results of significant diagnostics from this hospitalization (including imaging, microbiology, ancillary and laboratory) are listed below for reference.    Microbiology: Recent Results (from the past 240 hours)  Resp panel by RT-PCR (RSV, Flu A&B, Covid) Anterior Nasal Swab     Status: None   Collection Time: 07/14/24 10:47 AM   Specimen: Anterior Nasal Swab  Result Value Ref Range Status   SARS Coronavirus 2 by RT PCR NEGATIVE NEGATIVE Final    Comment: (NOTE) SARS-CoV-2 target nucleic acids are NOT DETECTED.  The SARS-CoV-2 RNA is generally detectable in upper respiratory specimens during the acute phase of infection. The lowest concentration of SARS-CoV-2 viral copies this assay can detect is 138 copies/mL. A negative result does not preclude SARS-Cov-2 infection and should not be used as the sole basis for  treatment or other patient management decisions. A negative result may occur with  improper specimen collection/handling, submission of specimen other than nasopharyngeal swab, presence of viral mutation(s) within the areas targeted by this assay, and inadequate number of viral copies(<138 copies/mL). A negative result must be combined with clinical observations, patient history, and epidemiological information. The expected result is Negative.  Fact Sheet for Patients:  BloggerCourse.com  Fact Sheet for Healthcare Providers:  SeriousBroker.it  This test is no t yet approved or cleared by the [place]  FDA and  has been authorized for detection and/or diagnosis of SARS-CoV-2 by FDA under an Emergency Use Authorization (EUA). This EUA will remain  in effect (meaning this test can be used) for the duration of the COVID-19 declaration under Section 564(b)(1) of the Act, 21 U.S.C.section 360bbb-3(b)(1), unless the authorization is terminated  or revoked sooner.       Influenza A by PCR NEGATIVE NEGATIVE Final   Influenza B by PCR NEGATIVE NEGATIVE Final    Comment: (NOTE) The Xpert Xpress SARS-CoV-2/FLU/RSV plus assay is intended as an aid in the diagnosis of influenza from Nasopharyngeal swab specimens and should not be used as a sole basis for treatment. Nasal washings and aspirates are unacceptable for Xpert Xpress SARS-CoV-2/FLU/RSV testing.  Fact Sheet for Patients: BloggerCourse.com  Fact Sheet for Healthcare Providers: SeriousBroker.it  This test is not yet approved or cleared by the [place]  FDA and has been authorized for detection and/or diagnosis of SARS-CoV-2 by FDA under an Emergency Use Authorization (EUA). This EUA will remain in effect (meaning this test can be used) for the duration of the COVID-19 declaration under Section 564(b)(1) of the Act, 21  U.S.C. section 360bbb-3(b)(1), unless the authorization is terminated or revoked.     Resp Syncytial Virus by PCR NEGATIVE NEGATIVE Final    Comment: (NOTE) Fact Sheet for Patients: BloggerCourse.com  Fact Sheet for Healthcare Providers: SeriousBroker.it  This test is not yet approved or cleared by the [place]  FDA and has been authorized for detection and/or diagnosis of SARS-CoV-2 by FDA under an Emergency Use Authorization (EUA). This EUA will remain in effect (meaning this test can be used) for the duration of the COVID-19 declaration under Section 564(b)(1) of the Act, 21 U.S.C. section 360bbb-3(b)(1), unless the authorization is terminated or revoked.  Performed at [facility], [address] [place], [place] [address]      Labs:  Basic Metabolic Panel: Recent Labs  Lab 07/15/24 0224 07/16/24 1033 07/17/24 1002 07/18/24 1029 07/19/24 0800  NA 139 139 138 139 136  K 3.4* 4.7 4.8  4.7 4.6  CL 104 104 101 103 100  CO2 24 25 25 25 24   GLUCOSE 153* 170* 171* 162* 185*  BUN 45* 50* 52* 63* 62*  CREATININE 1.83* 1.96* 1.90* 1.96* 2.04*  CALCIUM  8.4* 8.7* 8.9 8.7* 8.7*  MG 2.0 2.1 2.2  --  2.0   CBC: Recent Labs  Lab 07/14/24 0951 07/15/24 0224 07/16/24 1033 07/17/24 1002  WBC 11.6* 8.6 9.7 9.6  HGB 11.2* 10.0* 10.9* 11.3*  HCT 33.5* 30.1* 33.0* 34.2*  MCV 93.6 94.1 94.3 94.2  PLT 196 193 221 258   CBG: Recent Labs  Lab 07/14/24 1641 07/14/24 2106 07/15/24 0639 07/15/24 1116 07/15/24 1350  GLUCAP 164* 218* 117* 194* 203*   Urinalysis    Component Value Date/Time   COLORURINE YELLOW 03/28/2018 1034   APPEARANCEUR CLEAR 03/28/2018 1034   LABSPEC 1.019 03/28/2018 1034   PHURINE 5.0 03/28/2018 1034   GLUCOSEU 50 (A) 03/28/2018 1034   GLUCOSEU 100 (A) 10/10/2015 0821   HGBUR NEGATIVE 03/28/2018 1034   BILIRUBINUR NEGATIVE 03/28/2018 1034   KETONESUR NEGATIVE 03/28/2018 1034    PROTEINUR NEGATIVE 03/28/2018 1034   UROBILINOGEN 0.2 10/10/2015 0821   NITRITE NEGATIVE 03/28/2018 1034   LEUKOCYTESUR NEGATIVE 03/28/2018 1034   Sepsis Labs Recent Labs  Lab 07/14/24 0951 07/15/24 0224 07/16/24 1033 07/17/24 1002  WBC 11.6* 8.6 9.7 9.6    Procedures/Studies: DG Chest Portable 1 View Result Date: 07/14/2024 CLINICAL DATA:  Shortness of breath, cough. Lower extremity swelling. EXAM: PORTABLE CHEST 1 VIEW COMPARISON:  January 10, 2024. FINDINGS: Stable cardiomediastinal silhouette. Increased bibasilar interstitial densities concerning for possible edema or atelectasis. Minimal pleural effusions are noted. Status post transcatheter aortic valve repair. Sternotomy wires are noted. Bony thorax is unremarkable. IMPRESSION: Increased bibasilar interstitial densities concerning for possible edema or atelectasis. Minimal pleural effusions are noted. Electronically Signed   By: Lynwood Landy Raddle M.D.   On: 07/14/2024 11:21    Time coordinating discharge: 60 mins  SIGNED:  Camellia Door, DO [facility] 07/19/24, 1:25 PM

## 2024-07-19 NOTE — Plan of Care (Signed)
 Patient discharged today. AVS provided and reviewed all questions answered. Patient is to follow up with PCP Copeland this week and restrict fluid to =< 60 oz of fluids daily. Instructed to take daily weights and to report 3lb gain in 24 hours or 5 lb gain in 3 days. Other red flags reviewed per AVS instructions. Educated on heart healthy diet and salt restriction also how to get adequate amounts of dietary potassium. Patient verbalized back understanding. Expressed his delight with the hospital at home program and the level of care received.  Problem: Fluid Volume: Goal: Ability to maintain a balanced intake and output will improve Outcome: Progressing   Problem: Metabolic: Goal: Ability to maintain appropriate glucose levels will improve Outcome: Progressing   Problem: Clinical Measurements: Goal: Ability to maintain clinical measurements within normal limits will improve Outcome: Progressing

## 2024-07-19 NOTE — Progress Notes (Signed)
 Patient did well overnight. Patient did have current health alarms for bradycardia with lowest heart rate ranging from 44 to 48 when sleeping. Patient's baseline heart rate 50's to 60's while awake. Patient also had current health alarms for tachypnea which were inaccurate. Respiration rate would have reading the same as heart rate. These would resolve after calling patient and having him do deep breathing or on their own. Patient's nasal cannula did come out of his nose once overnight while sleeping. Oxygen level went down to 83% at that time but quickly resolved after oxygen was placed back on patient and he performed some slow deep breaths.

## 2024-07-21 ENCOUNTER — Telehealth: Payer: Self-pay

## 2024-07-21 ENCOUNTER — Other Ambulatory Visit (HOSPITAL_COMMUNITY): Payer: Self-pay

## 2024-07-21 NOTE — Transitions of Care (Post Inpatient/ED Visit) (Signed)
 07/21/2024  Name: Peter Scott MRN: [id number] DOB: 1932-10-31  Today's TOC FU Call Status: Today's TOC FU Call Status:: Successful TOC FU Call Completed TOC FU Call Complete Date: 07/21/24 Patient's Name and Date of Birth confirmed.  Transition Care Management Follow-up Telephone Call Date of Discharge: 07/19/24 Discharge Facility: [facility]) Type of Discharge: Inpatient Admission Primary Inpatient Discharge Diagnosis:: CHF How have you been since you were released from the hospital?: Better Any questions or concerns?: No  Items Reviewed: Did you receive and understand the discharge instructions provided?: Yes Medications obtained,verified, and reconciled?: Yes (Medications Reviewed) Any new allergies since your discharge?: No Dietary orders reviewed?: Yes Type of Diet Ordered:: Renal/DM Do you have support at home?: Yes People in Home [RPT]: spouse Name of Support/Comfort Primary Source: [name], spouse  Medications Reviewed Today: Medications Reviewed Today     Reviewed by Moises Reusing, RN (Case Manager) on 07/21/24 at 1423  Med List Status: <None>   Medication Order Taking? Sig Documenting Provider Last Dose Status Informant  acetaminophen  (TYLENOL ) 500 MG tablet [id number]  Take 500 mg by mouth every 6 (six) hours as needed for fever or headache (pain). [provider]  Active Pharmacy Records, Self  amiodarone  (PACERONE ) 200 MG tablet [id number]  Take 1 tablet (200 mg total) by mouth daily. Pietro Redell RAMAN, MD  Active Self, Pharmacy Records           Med Note (COFFELL, JON HERO   Wed Jul 15, 2024  7:35 AM) [facility].  amLODipine  (NORVASC ) 10 MG tablet [id number]  Take 10 mg by mouth daily before breakfast. [provider]  Active Pharmacy Records, Self           Med Note (COFFELL, JON HERO   Wed Jul 15, 2024  8:34 AM) [facility] LF [facility]24[facility]24 #90.  amoxicillin  (AMOXIL ) 500 MG capsule [id number]  Take 4 capsules (2,000 mg total) by  mouth one hour prior to dental procedure.   Active Pharmacy Records, Self  apixaban  (ELIQUIS ) 2.5 MG TABS tablet [id number]  Take 1 tablet (2.5 mg total) by mouth 2 (two) times daily. Pietro Redell RAMAN, MD  Active Pharmacy Records, Self           Med Note (COFFELL, JON HERO   Wed Jul 15, 2024  7:36 AM) [facility], LF 05/03/24 #90.  atorvastatin  (LIPITOR) 40 MG tablet [id number]  Take 40 mg by mouth at bedtime. [provider]  Active Pharmacy Records, Self           Med Note (COFFELL, JON HERO   Wed Jul 15, 2024  7:37 AM) [facility], LF 06/10/24 #90.  furosemide  (LASIX ) 40 MG tablet [id number]  Take 1 tablet (40 mg total) by mouth 2 (two) times daily. Laurence Locus, DO  Active   glucose blood (CONTOUR NEXT TEST) test strip [id number]  Use once a day to check blood sugar.  Dx code: E11.9 Copland, Harlene BROCKS, MD  Active Pharmacy Records, Self  insulin  glargine (SEMGLEE , YFGN,) 100 UNIT/ML Solostar Pen [id number]  Inject 11 Units into the skin at bedtime. [provider]  Active Pharmacy Records, Self           Med Note (COFFELL, JON HERO   Wed Jul 15, 2024  8:43 AM) Patient states he is currently using Semglee  (LF 03/15/24 #5 pens). Per [facility] records, Semglee  was discontinued - generic Lantus  solostar most recently dispensed (04/07/24 #5 pens) with converted from glargine-yfgn in the sig.  losartan  (  COZAAR ) 100 MG tablet [id number]  Take 0.5 tablets (50 mg total) by mouth 2 (two) times daily.  Patient taking differently: Take 100 mg by mouth every evening.   Copland, Harlene BROCKS, MD  Active Self, Pharmacy Records           Med Note (COFFELL, JON HERO   Wed Jul 15, 2024  7:41 AM) [facility], LF 05/10/24 #90.  OXYGEN [id number] Yes Inhale 1 L/min into the lungs daily as needed. [provider]  Active   potassium chloride  SA (KLOR-CON  M) 20 MEQ tablet [id number]  Take 1 tablet (20 mEq total) by mouth daily. Laurence Locus, DO  Active             Home Care and Equipment/Supplies: Were Home  Health Services Ordered?: No Any new equipment or medical supplies ordered?: Yes Name of Medical supply agency?: [employer] Were you able to get the equipment/medical supplies?: Yes Do you have any questions related to the use of the equipment/supplies?: No  Functional Questionnaire: Do you need assistance with bathing/showering or dressing?: No Do you need assistance with meal preparation?: No Do you need assistance with eating?: No Do you have difficulty maintaining continence: No Do you need assistance with getting out of bed/getting out of a chair/moving?: No Do you have difficulty managing or taking your medications?: No  Follow up appointments reviewed: PCP Follow-up appointment confirmed?: Yes Date of PCP follow-up appointment?: 07/23/24 Follow-up Provider: [facility] Follow-up appointment confirmed?: NA Do you need transportation to your follow-up appointment?: No Do you understand care options if your condition(s) worsen?: Yes-patient verbalized understanding  SDOH Interventions Today    Flowsheet Row Most Recent Value  SDOH Interventions   Food Insecurity Interventions Intervention Not Indicated  Housing Interventions Intervention Not Indicated  Transportation Interventions Intervention Not Indicated  Utilities Interventions Intervention Not Indicated    Medford Balboa, BSN, RN [place]  [facility] - [facility] RN Care Manager [phone]

## 2024-07-21 NOTE — Progress Notes (Signed)
 [facility] [address], [address] [place], [place] [address] [phone]  Date:  07/23/2024   Name:  Peter Scott   DOB:  1932/08/02   MRN:  [id number]  PCP:  Watt Harlene BROCKS, MD    Chief Complaint: No chief complaint on file.   History of Present Illness:  Peter Scott is a 89 y.o. very pleasant male patient who presents with the following:  Patient seen today for follow-up, he recently was admitted by hospital at home.  I last saw him myself in April History of diabetes, atrial fibrillation, aortic valve replacement, CABG, skin cancer, hypertension, hyperlipidemia  He was admitted from 8/26 through 8/31 with shortness of breath-we need to recheck a BMP today - I received a message from his inpatient attending Dr. Laurence who offered the following suggestions Took care of your delightful patient Peter Scott in the new Hospital-At-Home program where we provide inpatient hospital level of care in the pt's own home.  He was admitted for CHF exacerbation and hypoxia.  He was diuresed about 10 lbs but is still on 1 L/min O2.  Scr trended up to 2.04 and BUN 62 with IV lasix . I was concerned about giving him an AKI with overly aggressive diuresis so I elected to stop his IV lasix  and change him back to po lasix  40 mg bid. I have held his ARB for the time being.  Can you arrange for him to have a repeat BMP in the office this week?SABRA He will be on lasix  40 mg bid at discharge.  Would only decrease lasix  to 20 mg bid if his renal function takes a significant decline. (I.e. Scr >=2.5 or BUN >=80) :  * Acute on chronic heart failure with preserved ejection fraction (HFpEF) (HCC) 07-16-2024 continue with IV lasix  40 mg bid. Holding ARB to give SBP and Scr more room for diuresis without causing injury to his already decreased renal function(CKD 3b). Monitor his daily weights.   **update. Today's BUN/Scr 50/1.96.  yesterday, BUN/Scr 45/1.83.  continue with IV lasix . Repeat labs in AM to monitor diuresis. Weight today was 147.9 lbs. Will see what he weighs tomorrow.  07-19-2024 Pitting edema has improved. Still hypoxic with ambulation. Needing 1 L/min O2. BUN/Scr up to 62 and 2.04.  IV lasix  held this AM.  Will change to PO lasix  40 mg bid. Plan for DC today from [facility]. Pt will need f/u BMP this week with PCP office to monitor his Scr while on 40 mg po bid lasix . Pt has appt with cardiology on August 19, 2024.  Would only decrease lasix  to 20 mg bid if his renal function takes a significant decline. (I.e. Scr >=2.5 or BUN >=80)    Discharge Diagnoses:  Principal Problem:   Acute on chronic heart failure with preserved ejection fraction (HFpEF) (HCC) Active Problems:   Acute respiratory failure with hypoxia (HCC)   Essential hypertension, benign   Coronary artery disease   Type II diabetes mellitus, well controlled (HCC)   Paroxysmal atrial fibrillation (HCC)   CKD stage 3b, GFR 30-44 ml/min (HCC) - baseline Scr 1.8-2.0   -He was still requiring oxygen, 1 L especially ambulation at time of discharge -Diabetes stable with Lantus  -He has an appoint with cardiology in about a month.  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   S/P CABG x 4    Severe aortic stenosis    Bruit 09/29/2014   Abdominal aortic aneurysm (HCC) 07/23/2014   BCC (basal cell carcinoma) 07/23/2014   SCC (squamous cell carcinoma), arm 07/23/2014   Essential hypertension, benign 07/08/2014   Coronary artery disease 07/08/2014   Hyperlipidemia LDL  goal <100 07/08/2014   Type II diabetes mellitus, well controlled (HCC) 07/08/2014    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   26 mm Edwards Sapien 3 transcatheter heart valve placed via percutaneous right transfemoral approach    Umbilical hernia     Past Surgical History:  Procedure Laterality Date   ABLATION     Rhythm problem; rhythm unknown; [place]   CARDIOVERSION N/A 07/14/2018   Procedure: CARDIOVERSION;  Surgeon: Maranda Leim DEL, MD;  Location: [facility] ENDOSCOPY;  Service: Cardiovascular;  Laterality: N/A;   CARDIOVERSION N/A 08/27/2018   Procedure: CARDIOVERSION;  Surgeon: Jeffrie Oneil BROCKS, MD;  Location: [facility] ENDOSCOPY;  Service: Cardiovascular;  Laterality: N/A;   CORONARY ARTERY BYPASS GRAFT  2001   EXPLORATION POST OPERATIVE OPEN HEART  2001, June   INTRAOPERATIVE TRANSTHORACIC ECHOCARDIOGRAM N/A 04/01/2018   Procedure: INTRAOPERATIVE TRANSTHORACIC ECHOCARDIOGRAM;  Surgeon: Verlin Lonni BIRCH, MD;  Location: [facility] OR;  Service: Open Heart Surgery;  Laterality: N/A;   RIGHT/LEFT HEART CATH AND CORONARY/GRAFT ANGIOGRAPHY N/A 01/31/2018   Procedure: RIGHT/LEFT HEART CATH AND CORONARY/GRAFT ANGIOGRAPHY;  Surgeon: Verlin Lonni BIRCH, MD;  Location: [facility] INVASIVE CV LAB;  Service: Cardiovascular;  Laterality: N/A;   TEE WITHOUT CARDIOVERSION N/A 01/17/2018   Procedure: TRANSESOPHAGEAL ECHOCARDIOGRAM (TEE);  Surgeon: Pietro Redell RAMAN, MD;  Location: [facility] ENDOSCOPY;  Service: Cardiovascular;  Laterality: N/A;   TEE WITHOUT CARDIOVERSION N/A 07/14/2018   Procedure: TRANSESOPHAGEAL ECHOCARDIOGRAM (TEE);  Surgeon: Maranda Leim DEL, MD;  Location: [facility] ENDOSCOPY;  Service: Cardiovascular;  Laterality: N/A;   TONSILLECTOMY     TRANSCATHETER AORTIC VALVE REPLACEMENT, TRANSFEMORAL N/A 04/01/2018   Procedure: TRANSCATHETER AORTIC VALVE REPLACEMENT, TRANSFEMORAL;  Surgeon: Verlin Lonni BIRCH, MD;  Location: [facility] OR;  Service: Open Heart Surgery;  Laterality: N/A;   UMBILICAL HERNIA REPAIR  2015    Social History   Tobacco Use   Smoking status: Former    Current packs/day: 1.00    Average packs/day: 1 pack/day for 3.0 years (3.0 ttl pk-yrs)    Types: Cigarettes   Smokeless tobacco: Never   Tobacco comments:    quit tobbaco in his 4s  Vaping Use   Vaping status: Never Used  Substance Use Topics   Alcohol use: No    Alcohol/week: 0.0 standard drinks of alcohol   Drug use: No    Family History  Problem Relation Age of Onset   Diabetes Mother 47  Deceased   Heart disease Father 68       Deceased   Heart disease Maternal Uncle    Diabetes Maternal Uncle    Heart disease Brother    Diabetes Brother    Diabetes Sister        #1   Heart disease Sister        #2   Hyperlipidemia Son        x2    Allergies  Allergen Reactions   Glucotrol [Glipizide] Palpitations and Other (See Comments)    Tachycardia     Medication list has been reviewed and updated.  Current Outpatient Medications on File Prior to Visit  Medication Sig Dispense Refill   acetaminophen  (TYLENOL ) 500 MG tablet Take 500 mg by mouth every 6 (six) hours as needed for fever or headache (pain).     amiodarone  (PACERONE ) 200 MG tablet Take 1 tablet (200 mg total) by mouth daily. 90 tablet 3   amLODipine  (NORVASC ) 10 MG tablet Take 10 mg by mouth daily before breakfast.     amoxicillin  (AMOXIL ) 500 MG capsule Take 4 capsules (2,000 mg total) by mouth one hour prior to dental procedure. 20 capsule 3   apixaban  (ELIQUIS ) 2.5 MG TABS tablet Take 1 tablet (2.5 mg total) by mouth 2 (two) times daily.     atorvastatin  (LIPITOR) 40  MG tablet Take 40 mg by mouth at bedtime.     furosemide  (LASIX ) 40 MG tablet Take 1 tablet (40 mg total) by mouth 2 (two) times daily. 180 tablet 0   glucose blood (CONTOUR NEXT TEST) test strip Use once a day to check blood sugar.  Dx code: E11.9 100 each 12   insulin  glargine (SEMGLEE , YFGN,) 100 UNIT/ML Solostar Pen Inject 11 Units into the skin at bedtime.     [Paused] losartan  (COZAAR ) 100 MG tablet Take 0.5 tablets (50 mg total) by mouth 2 (two) times daily. (Patient taking differently: Take 100 mg by mouth every evening.) 90 tablet 1   potassium chloride  SA (KLOR-CON  M) 20 MEQ tablet Take 1 tablet (20 mEq total) by mouth daily. 90 tablet 0   No current facility-administered medications on file prior to visit.    Review of Systems:  As per HPI- otherwise negative.   Physical Examination: There were no vitals filed for this visit. There were no vitals filed for this visit. There is no height or weight on file to calculate BMI. Ideal Body Weight:    GEN: no acute distress. HEENT: Atraumatic, Normocephalic.  Ears and Nose: No external deformity. CV: RRR, No M/G/R. No JVD. No thrill. No extra heart sounds. PULM: CTA B, no wheezes, crackles, rhonchi. No retractions. No resp. distress. No accessory muscle use. ABD: S, NT, ND, +BS. No rebound. No HSM. EXTR: No c/c/e PSYCH: Normally interactive. Conversant.    Assessment and Plan: No diagnosis found.  Assessment & Plan   Signed Harlene Schroeder, MD

## 2024-07-22 ENCOUNTER — Other Ambulatory Visit (HOSPITAL_BASED_OUTPATIENT_CLINIC_OR_DEPARTMENT_OTHER): Payer: Self-pay

## 2024-07-23 ENCOUNTER — Ambulatory Visit (INDEPENDENT_AMBULATORY_CARE_PROVIDER_SITE_OTHER): Admitting: Family Medicine

## 2024-07-23 ENCOUNTER — Encounter: Payer: Self-pay | Admitting: Family Medicine

## 2024-07-23 ENCOUNTER — Ambulatory Visit (HOSPITAL_BASED_OUTPATIENT_CLINIC_OR_DEPARTMENT_OTHER)
Admission: RE | Admit: 2024-07-23 | Discharge: 2024-07-23 | Disposition: A | Discharge status: Home / Self Care | Source: Ambulatory Visit | Attending: Family Medicine | Admitting: Family Medicine

## 2024-07-23 VITALS — BP 152/64 | HR 54 | Ht 68.0 in | Wt 151.6 lb

## 2024-07-23 DIAGNOSIS — I509 Heart failure, unspecified: Secondary | ICD-10-CM | POA: Diagnosis not present

## 2024-07-23 DIAGNOSIS — J90 Pleural effusion, not elsewhere classified: Secondary | ICD-10-CM | POA: Diagnosis not present

## 2024-07-23 DIAGNOSIS — Z5181 Encounter for therapeutic drug level monitoring: Secondary | ICD-10-CM

## 2024-07-23 DIAGNOSIS — I700 Atherosclerosis of aorta: Secondary | ICD-10-CM | POA: Diagnosis not present

## 2024-07-23 DIAGNOSIS — R0989 Other specified symptoms and signs involving the circulatory and respiratory systems: Secondary | ICD-10-CM | POA: Diagnosis not present

## 2024-07-23 LAB — BASIC METABOLIC PANEL WITH GFR
BUN: 61 mg/dL — ABNORMAL HIGH (ref 6–23)
CO2: 29 meq/L (ref 19–32)
Calcium: 8.9 mg/dL (ref 8.4–10.5)
Chloride: 101 meq/L (ref 96–112)
Creatinine, Ser: 2.07 mg/dL — ABNORMAL HIGH (ref 0.40–1.50)
GFR: 27.69 mL/min — ABNORMAL LOW (ref >60.00)
Glucose, Bld: 212 mg/dL — ABNORMAL HIGH (ref 70–99)
Potassium: 4.2 meq/L (ref 3.5–5.1)
Sodium: 140 meq/L (ref 135–145)

## 2024-07-23 NOTE — Patient Instructions (Addendum)
 Continue to check daily weights- please contact me if you go up by more than 2 lbs in a day or 4 lbs in a week  I will be in touch with you later today or tomorrow with your labs and chest x-ray- we will adjust your medications as needed   Get a pulse oximeter for home so you can monitor your oxygen level and know when you can come off the oxygen

## 2024-07-27 ENCOUNTER — Encounter: Payer: Self-pay | Admitting: Family Medicine

## 2024-08-05 ENCOUNTER — Other Ambulatory Visit (INDEPENDENT_AMBULATORY_CARE_PROVIDER_SITE_OTHER): Payer: Self-pay

## 2024-08-05 ENCOUNTER — Other Ambulatory Visit (HOSPITAL_BASED_OUTPATIENT_CLINIC_OR_DEPARTMENT_OTHER): Payer: Self-pay

## 2024-08-05 DIAGNOSIS — Z23 Encounter for immunization: Secondary | ICD-10-CM

## 2024-08-05 MED ORDER — COMIRNATY 30 MCG/0.3ML IM SUSY
0.3000 mL | PREFILLED_SYRINGE | Freq: Once | INTRAMUSCULAR | 0 refills | Status: AC
Start: 2024-08-05 — End: 2024-08-06
  Filled 2024-08-05: qty 0.3, 1d supply, fill #0

## 2024-08-08 NOTE — Progress Notes (Signed)
 HPI: FU CAD and AVR. Patient is status post coronary artery bypass graft in [place]  in 2001. He also had ablation of what sounds to be SVT at that time. Carotid Dopplers March 2019 showed 1 to 39% bilateral stenosis. He underwent TAVR 5/19. Patient had preoperative cardiac catheterization that revealed severe native three-vessel coronary artery disease with patency of grafts.  Also with h/o PAF. Abd ultrasound 2/21 showed no AAA. Echocardiogram March 2025 showed ejection fraction 50 to 55%, moderate pulmonary hypertension, severe left atrial enlargement, mild to moderate mitral regurgitation, status post aortic valve replacement with mean gradient 14 mmHg and trace to mild aortic insufficiency, moderate tricuspid regurgitation.  Patient admitted August 2025 with acute on chronic diastolic congestive heart failure.  He was diuresed with improvement.  Since last seen, he denies increased dyspnea, chest pain or syncope.  Occasional mild pedal edema.  He  Current Outpatient Medications  Medication Sig Dispense Refill   acetaminophen  (TYLENOL ) 500 MG tablet Take 500 mg by mouth every 6 (six) hours as needed for fever or headache (pain).     amiodarone  (PACERONE ) 200 MG tablet Take 1 tablet (200 mg total) by mouth daily. 90 tablet 3   amLODipine  (NORVASC ) 10 MG tablet Take 10 mg by mouth daily before breakfast.     amoxicillin  (AMOXIL ) 500 MG capsule Take 4 capsules (2,000 mg total) by mouth one hour prior to dental procedure. 20 capsule 3   apixaban  (ELIQUIS ) 2.5 MG TABS tablet Take 1 tablet (2.5 mg total) by mouth 2 (two) times daily.     atorvastatin  (LIPITOR) 40 MG tablet Take 40 mg by mouth at bedtime.     furosemide  (LASIX ) 40 MG tablet Take 1 tablet (40 mg total) by mouth 2 (two) times daily. 180 tablet 0   glucose blood (CONTOUR NEXT TEST) test strip Use once a day to check blood sugar.  Dx code: E11.9 100 each 12   insulin  glargine (SEMGLEE , YFGN,) 100 UNIT/ML Solostar Pen Inject 11 Units  into the skin at bedtime.     OXYGEN Inhale 1 L/min into the lungs daily as needed.     potassium chloride  SA (KLOR-CON  M) 20 MEQ tablet Take 1 tablet (20 mEq total) by mouth daily. 90 tablet 0   [Paused] losartan  (COZAAR ) 100 MG tablet Take 0.5 tablets (50 mg total) by mouth 2 (two) times daily. (Patient not taking: Reported on 08/19/2024) 90 tablet 1   No current facility-administered medications for this visit.     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   26 mm Edwards Sapien 3 transcatheter heart valve placed via percutaneous right transfemoral approach    Umbilical hernia     Past Surgical History:  Procedure Laterality Date   ABLATION     Rhythm problem; rhythm unknown; [place]   CARDIOVERSION N/A 07/14/2018   Procedure: CARDIOVERSION;  Surgeon: Maranda Leim DEL, MD;  Location: [facility] ENDOSCOPY;  Service: Cardiovascular;  Laterality: N/A;   CARDIOVERSION N/A 08/27/2018   Procedure: CARDIOVERSION;  Surgeon: Jeffrie Oneil BROCKS,  MD;  Location: [facility] ENDOSCOPY;  Service: Cardiovascular;  Laterality: N/A;   CORONARY ARTERY BYPASS GRAFT  2001   EXPLORATION POST OPERATIVE OPEN HEART  2001, June   INTRAOPERATIVE TRANSTHORACIC ECHOCARDIOGRAM N/A 04/01/2018   Procedure: INTRAOPERATIVE TRANSTHORACIC ECHOCARDIOGRAM;  Surgeon: Verlin Lonni BIRCH, MD;  Location: [facility] OR;  Service: Open Heart Surgery;  Laterality: N/A;   RIGHT/LEFT HEART CATH AND CORONARY/GRAFT ANGIOGRAPHY N/A 01/31/2018   Procedure: RIGHT/LEFT HEART CATH AND CORONARY/GRAFT ANGIOGRAPHY;  Surgeon: Verlin Lonni BIRCH, MD;  Location: [facility] INVASIVE CV LAB;  Service:  Cardiovascular;  Laterality: N/A;   TEE WITHOUT CARDIOVERSION N/A 01/17/2018   Procedure: TRANSESOPHAGEAL ECHOCARDIOGRAM (TEE);  Surgeon: Pietro Redell RAMAN, MD;  Location: [facility] ENDOSCOPY;  Service: Cardiovascular;  Laterality: N/A;   TEE WITHOUT CARDIOVERSION N/A 07/14/2018   Procedure: TRANSESOPHAGEAL ECHOCARDIOGRAM (TEE);  Surgeon: Maranda Leim DEL, MD;  Location: [facility] ENDOSCOPY;  Service: Cardiovascular;  Laterality: N/A;   TONSILLECTOMY     TRANSCATHETER AORTIC VALVE REPLACEMENT, TRANSFEMORAL N/A 04/01/2018   Procedure: TRANSCATHETER AORTIC VALVE REPLACEMENT, TRANSFEMORAL;  Surgeon: Verlin Lonni BIRCH, MD;  Location: [facility] OR;  Service: Open Heart Surgery;  Laterality: N/A;   UMBILICAL HERNIA REPAIR  2015    Social History   Socioeconomic History   Marital status: Married    Spouse name: Not on file   Number of children: 2   Years of education: Not on file   Highest education level: Not on file  Occupational History   Not on file  Tobacco Use   Smoking status: Former    Current packs/day: 1.00    Average packs/day: 1 pack/day for 3.0 years (3.0 ttl pk-yrs)    Types: Cigarettes   Smokeless tobacco: Never   Tobacco comments:    quit tobbaco in his 60s  Vaping Use   Vaping status: Never Used  Substance and Sexual Activity   Alcohol use: No    Alcohol/week: 0.0 standard drinks of alcohol   Drug use: No   Sexual activity: Not on file  Other Topics Concern   Not on file  Social History Narrative   Not on file   Social Drivers of Health   Financial Resource Strain: Low Risk  (09/08/2021)   [facility] from [facility]   Overall Financial Resource Strain (CARDIA)    Difficulty of Paying Living Expenses: Not very hard  Food Insecurity: No Food Insecurity (07/21/2024)   Hunger Vital Sign    Worried About Running Out of Food in the Last Year: Never true    Ran Out of Food in the Last Year: Never true  Transportation Needs: No Transportation Needs (07/21/2024)   PRAPARE -  Administrator, Civil Service (Medical): No    Lack of Transportation (Non-Medical): No  Physical Activity: Sufficiently Active (09/08/2021)   Received from [facility]   Exercise Vital Sign    On average, how many days per week do you engage in moderate to strenuous exercise (like a brisk walk)?: 3 days    On average, how many minutes do you engage in exercise at this level?: 60 min  Stress: No Stress Concern Present (09/08/2021)   Received from [facility] - Occupational Stress Questionnaire    Feeling of Stress : Not at all  Social Connections: Moderately Integrated (07/14/2024)   Social Connection and Isolation Panel    Frequency of Communication with Friends and Family: More than three times a week    Frequency of Social Gatherings with  Friends and Family: More than three times a week    Attends Religious Services: More than 4 times per year    Active Member of Clubs or Organizations: No    Attends Banker Meetings: Never    Marital Status: Married  Catering manager Violence: Not At Risk (07/21/2024)   Humiliation, Afraid, Rape, and Kick questionnaire    Fear of Current or Ex-Partner: No    Emotionally Abused: No    Physically Abused: No    Sexually Abused: No    Family History  Problem Relation Age of Onset   Diabetes Mother 50       Deceased   Heart disease Father 64       Deceased   Heart disease Maternal Uncle    Diabetes Maternal Uncle    Heart disease Brother    Diabetes Brother    Diabetes Sister        #1   Heart disease Sister        #2   Hyperlipidemia Son        x2    ROS: no fevers or chills, productive cough, hemoptysis, dysphasia, odynophagia, melena, hematochezia, dysuria, hematuria, rash, seizure activity, orthopnea, PND, pedal edema, claudication. Remaining systems are negative.  Physical Exam: Well-developed well-nourished in no acute distress.  Skin is warm and dry.  HEENT is  normal.  Neck is supple.  Chest is clear to auscultation with normal expansion.  Cardiovascular exam is regular rate and rhythm.  2/6 systolic murmur left sternal border. Abdominal exam nontender or distended. No masses palpated. Extremities show trace to 1+ edema. neuro grossly intact   A/P  1 paroxysmal atrial fibrillation-patient is in sinus rhythm today on exam.  Continue amiodarone  and apixaban  at present dose.  2 chronic diastolic congestive heart failure-patient is euvolemic today.  Continue diuretic at present dose.  Check potassium and renal function.  We discussed fluid restriction, low-sodium diet and daily weights.  3 status post TAVR-most recent echocardiogram showed mean gradient 14 mmHg and trace to mild aortic insufficiency.  Continue SBE prophylaxis.  4 mitral regurgitation-mild to moderate on most recent echocardiogram.  5 hyperlipidemia-continue statin.  6 hypertension-blood pressure is controlled.  Continue present medications.  7 coronary artery disease-continue statin.  8 chronic stage IIIa kidney disease  Redell Shallow, MD

## 2024-08-19 ENCOUNTER — Other Ambulatory Visit (HOSPITAL_BASED_OUTPATIENT_CLINIC_OR_DEPARTMENT_OTHER): Payer: Self-pay

## 2024-08-19 ENCOUNTER — Encounter: Payer: Self-pay | Admitting: Cardiology

## 2024-08-19 ENCOUNTER — Ambulatory Visit: Attending: Cardiology | Admitting: Cardiology

## 2024-08-19 VITALS — BP 149/53 | HR 54 | Ht 68.0 in | Wt 141.0 lb

## 2024-08-19 DIAGNOSIS — I2510 Atherosclerotic heart disease of native coronary artery without angina pectoris: Secondary | ICD-10-CM | POA: Diagnosis not present

## 2024-08-19 DIAGNOSIS — E785 Hyperlipidemia, unspecified: Secondary | ICD-10-CM | POA: Insufficient documentation

## 2024-08-19 DIAGNOSIS — I10 Essential (primary) hypertension: Secondary | ICD-10-CM | POA: Insufficient documentation

## 2024-08-19 DIAGNOSIS — Z952 Presence of prosthetic heart valve: Secondary | ICD-10-CM | POA: Insufficient documentation

## 2024-08-19 DIAGNOSIS — I480 Paroxysmal atrial fibrillation: Secondary | ICD-10-CM | POA: Insufficient documentation

## 2024-08-19 NOTE — Patient Instructions (Signed)

## 2024-08-20 ENCOUNTER — Ambulatory Visit: Payer: Self-pay | Admitting: Cardiology

## 2024-08-20 LAB — BASIC METABOLIC PANEL WITH GFR
BUN/Creatinine Ratio: 27 — ABNORMAL HIGH (ref 10–24)
BUN: 54 mg/dL — ABNORMAL HIGH (ref 10–36)
CO2: 22 mmol/L (ref 20–29)
Calcium: 9.4 mg/dL (ref 8.6–10.2)
Chloride: 99 mmol/L (ref 96–106)
Creatinine, Ser: 1.97 mg/dL — ABNORMAL HIGH (ref 0.76–1.27)
Glucose: 184 mg/dL — ABNORMAL HIGH (ref 70–99)
Potassium: 4.5 mmol/L (ref 3.5–5.2)
Sodium: 138 mmol/L (ref 134–144)
eGFR: 32 mL/min/1.73 — ABNORMAL LOW (ref >59)

## 2024-08-24 ENCOUNTER — Telehealth: Payer: Self-pay | Admitting: Cardiology

## 2024-08-24 NOTE — Telephone Encounter (Signed)
 Pt advised to contact the supplier to request more oxygen tubing and if unable to get them to let us  know.

## 2024-08-24 NOTE — Telephone Encounter (Signed)
 Patient says he was advises to switch out oxygen nasal tube every 2-3 weeks and he would like to know if this is something he needs a prescription for. Please advise.

## 2024-08-26 DIAGNOSIS — C44622 Squamous cell carcinoma of skin of right upper limb, including shoulder: Secondary | ICD-10-CM | POA: Diagnosis not present

## 2024-08-26 DIAGNOSIS — D0462 Carcinoma in situ of skin of left upper limb, including shoulder: Secondary | ICD-10-CM | POA: Diagnosis not present

## 2024-08-26 DIAGNOSIS — L570 Actinic keratosis: Secondary | ICD-10-CM | POA: Diagnosis not present

## 2024-09-01 ENCOUNTER — Telehealth: Payer: Self-pay | Admitting: Cardiology

## 2024-09-01 NOTE — Progress Notes (Unsigned)
 HPI: FU CAD, CHF and AVR. Patient is status post coronary artery bypass graft in [place]  in 2001. He also had ablation of what sounds to be SVT at that time. Carotid Dopplers March 2019 showed 1 to 39% bilateral stenosis. He underwent TAVR 5/19. Patient had preoperative cardiac catheterization that revealed severe native three-vessel coronary artery disease with patency of grafts.  Also with h/o PAF. Abd ultrasound 2/21 showed no AAA. Echocardiogram March 2025 showed ejection fraction 50 to 55%, moderate pulmonary hypertension, severe left atrial enlargement, mild to moderate mitral regurgitation, status post aortic valve replacement with mean gradient 14 mmHg and trace to mild aortic insufficiency, moderate tricuspid regurgitation.  Patient contacted the office with increased dyspnea and lower ext edema and added to my schedule today. Since last seen, patient notes dyspnea with activities.  He denies orthopnea or PND.  Mild pedal edema.  No chest pain or syncope.  Current Outpatient Medications  Medication Sig Dispense Refill   acetaminophen  (TYLENOL ) 500 MG tablet Take 500 mg by mouth every 6 (six) hours as needed for fever or headache (pain).     amiodarone  (PACERONE ) 200 MG tablet Take 1 tablet (200 mg total) by mouth daily. 90 tablet 3   amLODipine  (NORVASC ) 10 MG tablet Take 10 mg by mouth daily before breakfast.     amoxicillin  (AMOXIL ) 500 MG capsule Take 4 capsules (2,000 mg total) by mouth one hour prior to dental procedure. 20 capsule 3   apixaban  (ELIQUIS ) 2.5 MG TABS tablet Take 1 tablet (2.5 mg total) by mouth 2 (two) times daily.     atorvastatin  (LIPITOR) 40 MG tablet Take 40 mg by mouth at bedtime.     furosemide  (LASIX ) 40 MG tablet Take 1 tablet (40 mg total) by mouth 2 (two) times daily. 180 tablet 0   glucose blood (CONTOUR NEXT TEST) test strip Use once a day to check blood sugar.  Dx code: E11.9 100 each 12   insulin  glargine (SEMGLEE , YFGN,) 100 UNIT/ML Solostar Pen  Inject 11 Units into the skin at bedtime.     [Paused] losartan  (COZAAR ) 100 MG tablet Take 0.5 tablets (50 mg total) by mouth 2 (two) times daily. 90 tablet 1   OXYGEN Inhale 1 L/min into the lungs daily as needed.     potassium chloride  SA (KLOR-CON  M) 20 MEQ tablet Take 1 tablet (20 mEq total) by mouth daily. 90 tablet 0   No current facility-administered medications for this visit.     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   26 mm Edwards Sapien 3 transcatheter heart valve placed via percutaneous right transfemoral approach    Umbilical hernia     Past Surgical History:  Procedure Laterality Date   ABLATION     Rhythm problem; rhythm unknown; [place]   CARDIOVERSION N/A 07/14/2018   Procedure: CARDIOVERSION;  Surgeon: Maranda Leim DEL, MD;  Location: [facility] ENDOSCOPY;  Service: Cardiovascular;  Laterality: N/A;   CARDIOVERSION N/A 08/27/2018   Procedure: CARDIOVERSION;  Surgeon: Jeffrie Oneil BROCKS, MD;  Location: [facility] ENDOSCOPY;  Service: Cardiovascular;  Laterality: N/A;   CORONARY ARTERY BYPASS GRAFT  2001   EXPLORATION POST OPERATIVE OPEN HEART  2001, June   INTRAOPERATIVE TRANSTHORACIC ECHOCARDIOGRAM N/A 04/01/2018   Procedure: INTRAOPERATIVE TRANSTHORACIC ECHOCARDIOGRAM;  Surgeon: Verlin Lonni BIRCH, MD;  Location: [facility] OR;  Service: Open Heart Surgery;  Laterality: N/A;   RIGHT/LEFT HEART CATH AND CORONARY/GRAFT ANGIOGRAPHY N/A 01/31/2018   Procedure: RIGHT/LEFT HEART CATH AND CORONARY/GRAFT ANGIOGRAPHY;  Surgeon: Verlin Lonni BIRCH, MD;  Location: [facility] INVASIVE CV LAB;  Service: Cardiovascular;  Laterality: N/A;    TEE WITHOUT CARDIOVERSION N/A 01/17/2018   Procedure: TRANSESOPHAGEAL ECHOCARDIOGRAM (TEE);  Surgeon: Pietro Redell RAMAN, MD;  Location: [facility] ENDOSCOPY;  Service: Cardiovascular;  Laterality: N/A;   TEE WITHOUT CARDIOVERSION N/A 07/14/2018   Procedure: TRANSESOPHAGEAL ECHOCARDIOGRAM (TEE);  Surgeon: Maranda Leim DEL, MD;  Location: [facility] ENDOSCOPY;  Service: Cardiovascular;  Laterality: N/A;   TONSILLECTOMY     TRANSCATHETER AORTIC VALVE REPLACEMENT, TRANSFEMORAL N/A 04/01/2018   Procedure: TRANSCATHETER AORTIC VALVE REPLACEMENT, TRANSFEMORAL;  Surgeon: Verlin Lonni BIRCH, MD;  Location: [facility] OR;  Service: Open Heart Surgery;  Laterality: N/A;   UMBILICAL HERNIA REPAIR  2015    Social History   Socioeconomic History   Marital status: Married    Spouse name: Not on file   Number of children: 2   Years of education: Not on file   Highest education level: Not on file  Occupational History   Not on file  Tobacco Use   Smoking status: Former    Current packs/day: 1.00    Average packs/day: 1 pack/day for 3.0 years (3.0 ttl pk-yrs)    Types: Cigarettes   Smokeless tobacco: Never   Tobacco comments:    quit tobbaco in his 46s  Vaping Use   Vaping status: Never Used  Substance and Sexual Activity   Alcohol use: No    Alcohol/week: 0.0 standard drinks of alcohol   Drug use: No   Sexual activity: Not on file  Other Topics Concern   Not on file  Social History Narrative   Not on file   Social Drivers of Health   Financial Resource Strain: Low Risk  (09/08/2021)   Received from [facility]   Overall Financial Resource Strain (CARDIA)    Difficulty of Paying Living Expenses: Not very hard  Food Insecurity: No Food Insecurity (07/21/2024)   Hunger Vital Sign    Worried About Running Out of Food in the Last Year: Never true    Ran Out of Food in the Last Year: Never true  Transportation Needs: No Transportation Needs (07/21/2024)   PRAPARE - Administrator, Civil Service  (Medical): No    Lack of Transportation (Non-Medical): No  Physical Activity: Sufficiently Active (09/08/2021)   Received from [facility]   Exercise Vital Sign    On average, how many days per week do you engage in moderate to strenuous exercise (like a brisk walk)?: 3 days    On average, how many minutes do you engage in exercise at this level?: 60 min  Stress: No Stress Concern Present (09/08/2021)   Received from [facility] - Occupational Stress Questionnaire    Feeling of Stress : Not at all  Social Connections: Moderately Integrated (07/14/2024)   Social Connection and Isolation Panel    Frequency of Communication with Friends and Family: More than three times a week    Frequency of Social Gatherings with Friends and  Family: More than three times a week    Attends Religious Services: More than 4 times per year    Active Member of Clubs or Organizations: No    Attends Banker Meetings: Never    Marital Status: Married  Catering manager Violence: Not At Risk (07/21/2024)   Humiliation, Afraid, Rape, and Kick questionnaire    Fear of Current or Ex-Partner: No    Emotionally Abused: No    Physically Abused: No    Sexually Abused: No    Family History  Problem Relation Age of Onset   Diabetes Mother 81       Deceased   Heart disease Father 60       Deceased   Heart disease Maternal Uncle    Diabetes Maternal Uncle    Heart disease Brother    Diabetes Brother    Diabetes Sister        #1   Heart disease Sister        #2   Hyperlipidemia Son        x2    ROS: no fevers or chills, productive cough, hemoptysis, dysphasia, odynophagia, melena, hematochezia, dysuria, hematuria, rash, seizure activity, orthopnea, PND, claudication. Remaining systems are negative.  Physical Exam: Well-developed well-nourished in no acute distress.  Skin is warm and dry.  HEENT is normal.  Neck is supple.  Chest is clear to  auscultation with normal expansion.  Cardiovascular exam is regular rate and rhythm.  2/6 systolic murmur left sternal border. Abdominal exam nontender or distended. No masses palpated. Extremities show trace to 1+ edema. neuro grossly intact  EKG Interpretation Date/Time:  Wednesday September 02 2024 10:02:58 EDT Ventricular Rate:  55 PR Interval:  244 QRS Duration:  116 QT Interval:  482 QTC Calculation: 461 R Axis:   -12  Text Interpretation: Sinus bradycardia with sinus arrhythmia with 1st degree A-V block Left ventricular hypertrophy with QRS widening ( R in aVL , Cornell product ) Confirmed by Pietro Rogue ([id number]) on 09/02/2024 10:04:11 AM     A/P  1 paroxysmal atrial fibrillation-patient remains in sinus rhythm today.  Continue amiodarone  and apixaban  at present dose.  Recent chest x-ray unrevealing.  Check TSH and liver functions.  2 chronic diastolic congestive heart failure-repeat echocardiogram.  Mildly volume overloaded on examination.  Discontinue Lasix  and treat with Demadex 40 mg in the morning and 20 mg in the evening.  Check potassium and renal function in 1 week.  3 status post TAVR-most recent echocardiogram showed mean gradient 14 mmHg and trace to mild aortic insufficiency.  Continue SBE prophylaxis.  4 mitral regurgitation-mild to moderate on most recent echocardiogram.  Will repeat study.  5 hyperlipidemia-continue statin.  6 hypertension-BP controlled; continue present meds and follow.   7 coronary artery disease-continue statin.  8 chronic stage IIIa kidney disease-we are trying to balance maintaining euvolemia versus worsening kidney function.  Check potassium and renal function 1 week after making diuretic change.  Rogue Pietro, MD

## 2024-09-01 NOTE — Telephone Encounter (Signed)
 Spoke with pt, Follow up scheduled

## 2024-09-01 NOTE — Telephone Encounter (Signed)
 Calling to speak with the nurse about the patient swelling in leg and his SOB. Patient is with the office now, he is there for a pre opp exam. Want to discuss the dosage on medication. Please advise

## 2024-09-01 NOTE — Telephone Encounter (Signed)
 Spoke to [employer] at [employer].Stated she saw patient this morning for pre op exam.Patient is scheduled for right cataract surgery  10/6.She is concerned patient was sob.Stated he did have to walk some distance before he got into office.He had 2+edema in right lower leg and 1+in left.Weight stable 142 lbs.Advised I will call patient and send message to Dr.Crenshaw.  Spoke to patient he stated he gets sob with slightest of exertion.Stated when he is sitting he is fine no sob.Stated he increased Lasix  to 40 mg twice a day about 1 week ago.He decreased Kdur 20 meq to 1/2 tablet 1 week ago due to constipation.He woke up last night with pressure feeling in stomach radiating up into chest.Stated he has pressure occasionally.Stated he got up and walked around pressure relieved.Stated his O2 sat was 94 % he wanted to know want level should he be concerned with.Advised O2 sat needs to be above 90%.Advised I will send message to [facility] for advice.

## 2024-09-02 ENCOUNTER — Ambulatory Visit: Attending: Cardiology | Admitting: Cardiology

## 2024-09-02 ENCOUNTER — Encounter: Payer: Self-pay | Admitting: Cardiology

## 2024-09-02 VITALS — BP 153/58 | HR 55 | Ht 69.0 in | Wt 150.8 lb

## 2024-09-02 DIAGNOSIS — I10 Essential (primary) hypertension: Secondary | ICD-10-CM | POA: Diagnosis not present

## 2024-09-02 DIAGNOSIS — I480 Paroxysmal atrial fibrillation: Secondary | ICD-10-CM | POA: Insufficient documentation

## 2024-09-02 DIAGNOSIS — I2510 Atherosclerotic heart disease of native coronary artery without angina pectoris: Secondary | ICD-10-CM | POA: Insufficient documentation

## 2024-09-02 DIAGNOSIS — I340 Nonrheumatic mitral (valve) insufficiency: Secondary | ICD-10-CM | POA: Diagnosis not present

## 2024-09-02 DIAGNOSIS — Z952 Presence of prosthetic heart valve: Secondary | ICD-10-CM | POA: Insufficient documentation

## 2024-09-02 DIAGNOSIS — R0609 Other forms of dyspnea: Secondary | ICD-10-CM | POA: Diagnosis not present

## 2024-09-02 DIAGNOSIS — E785 Hyperlipidemia, unspecified: Secondary | ICD-10-CM | POA: Insufficient documentation

## 2024-09-02 MED ORDER — TORSEMIDE 20 MG PO TABS: ORAL_TABLET | ORAL | 3 refills | Status: DC

## 2024-09-02 NOTE — Patient Instructions (Signed)
 Medication Instructions:   STOP FUROSEMIDE   START TORSEMIDE 20 MG TAKE 2 TABLETS EVERY MORNING AND 1 TABLET EVERY AFTERNOON  *If you need a refill on your cardiac medications before your next appointment, please call your pharmacy*  Lab Work:  Your physician recommends that you return for lab work in: ONE WEEK-DO NOT NEED TO FAST  If you have labs (blood work) drawn today and your tests are completely normal, you will receive your results only by: MyChart Message (if you have MyChart) OR A paper copy in the mail If you have any lab test that is abnormal or we need to change your treatment, we will call you to review the results.  Testing/Procedures:  Your physician has requested that you have an echocardiogram. Echocardiography is a painless test that uses sound waves to create images of your heart. It provides your doctor with information about the size and shape of your heart and how well your heart's chambers and valves are working. This procedure takes approximately one hour. There are no restrictions for this procedure. Please do NOT wear cologne, perfume, aftershave, or lotions (deodorant is allowed). Please arrive 15 minutes prior to your appointment time.  Please note: We ask at that you not bring children with you during ultrasound (echo/ vascular) testing. Due to room size and safety concerns, children are not allowed in the ultrasound rooms during exams. Our front office staff cannot provide observation of children in our lobby area while testing is being conducted. An adult accompanying a patient to their appointment will only be allowed in the ultrasound room at the discretion of the ultrasound technician under special circumstances. We apologize for any inconvenience. [facility]  Follow-Up: At [facility], you and your health needs are our priority.  As part of our continuing mission to provide you with exceptional heart care, our providers are all part of  one team.  This team includes your primary Cardiologist (physician) and Advanced Practice Providers or APPs (Physician Assistants and Nurse Practitioners) who all work together to provide you with the care you need, when you need it.  Your next appointment:   3 month(s)  Provider:   Redell Shallow, MD IN THE [place] [facility]

## 2024-09-11 DIAGNOSIS — I480 Paroxysmal atrial fibrillation: Secondary | ICD-10-CM | POA: Diagnosis not present

## 2024-09-11 DIAGNOSIS — R0609 Other forms of dyspnea: Secondary | ICD-10-CM | POA: Diagnosis not present

## 2024-09-11 DIAGNOSIS — E785 Hyperlipidemia, unspecified: Secondary | ICD-10-CM | POA: Diagnosis not present

## 2024-09-12 LAB — BASIC METABOLIC PANEL WITH GFR
BUN/Creatinine Ratio: 24 (ref 10–24)
BUN: 50 mg/dL — ABNORMAL HIGH (ref 10–36)
CO2: 26 mmol/L (ref 20–29)
Calcium: 8.9 mg/dL (ref 8.6–10.2)
Chloride: 99 mmol/L (ref 96–106)
Creatinine, Ser: 2.06 mg/dL — ABNORMAL HIGH (ref 0.76–1.27)
Glucose: 176 mg/dL — ABNORMAL HIGH (ref 70–99)
Potassium: 4.4 mmol/L (ref 3.5–5.2)
Sodium: 140 mmol/L (ref 134–144)
eGFR: 30 mL/min/1.73 — ABNORMAL LOW (ref >59)

## 2024-09-12 LAB — HEPATIC FUNCTION PANEL
ALT: 24 IU/L (ref 0–44)
AST: 24 IU/L (ref 0–40)
Albumin: 4.8 g/dL — ABNORMAL HIGH (ref 3.6–4.6)
Alkaline Phosphatase: 84 IU/L (ref 48–129)
Bilirubin Total: 0.5 mg/dL (ref 0.0–1.2)
Bilirubin, Direct: 0.18 mg/dL (ref 0.00–0.40)
Total Protein: 6.6 g/dL (ref 6.0–8.5)

## 2024-09-12 LAB — TSH: TSH: 5.8 u[IU]/mL — ABNORMAL HIGH (ref 0.450–4.500)

## 2024-09-13 ENCOUNTER — Ambulatory Visit: Payer: Self-pay | Admitting: Cardiology

## 2024-09-14 ENCOUNTER — Telehealth: Payer: Self-pay | Admitting: Family Medicine

## 2024-09-14 NOTE — Telephone Encounter (Signed)
 Patient called regarding his oxygen.  Unfortunately he is still using oxygen about 80% of the time and would like to get a portable concentrator so he can be more mobile.  His current oxygen setting is 1L He uses [employer] at [phone] (p)  They need an order for pulsed dose portable oxygen concentrator  Liter flow Please provide portable oxygen concentrator and titrate in office  Fax to [phone]

## 2024-09-16 ENCOUNTER — Ambulatory Visit (HOSPITAL_BASED_OUTPATIENT_CLINIC_OR_DEPARTMENT_OTHER)
Admission: RE | Admit: 2024-09-16 | Discharge: 2024-09-16 | Disposition: A | Discharge status: Home / Self Care | Source: Ambulatory Visit | Attending: Cardiology | Admitting: Cardiology

## 2024-09-16 DIAGNOSIS — R0609 Other forms of dyspnea: Secondary | ICD-10-CM | POA: Insufficient documentation

## 2024-09-16 MED ORDER — FUROSEMIDE 20 MG PO TABS: ORAL_TABLET | ORAL | Status: DC

## 2024-09-17 ENCOUNTER — Telehealth: Payer: Self-pay | Admitting: Cardiology

## 2024-09-17 LAB — ECHOCARDIOGRAM COMPLETE
AR max vel: 1.43 cm2
AV Area VTI: 1.49 cm2
AV Area mean vel: 1.41 cm2
AV Mean grad: 15.4 mmHg
AV Peak grad: 29.3 mmHg
AV Vena cont: 0.2 cm
Ao pk vel: 2.71 m/s
Area-P 1/2: 3.93 cm2
Calc EF: 39.5 %
MV M vel: 5.55 m/s
MV Peak grad: 123.2 mmHg
S' Lateral: 4.2 cm
Single Plane A2C EF: 42.2 %
Single Plane A4C EF: 40.4 %

## 2024-09-17 NOTE — Telephone Encounter (Signed)
Left message for jennifer to call  

## 2024-09-17 NOTE — Telephone Encounter (Signed)
 Spoke with [employer], she ask that once the echo is read for us  to fax a copy to [phone]. Echo was completed yesterday.

## 2024-09-17 NOTE — Telephone Encounter (Signed)
 Caller Peter Scott) wants a call back directly from [employer].

## 2024-09-18 NOTE — Telephone Encounter (Signed)
Echo results faxed to the number provided

## 2024-10-12 ENCOUNTER — Encounter (HOSPITAL_COMMUNITY): Payer: Self-pay | Admitting: Hospitalist

## 2024-10-12 ENCOUNTER — Emergency Department (HOSPITAL_BASED_OUTPATIENT_CLINIC_OR_DEPARTMENT_OTHER)

## 2024-10-12 ENCOUNTER — Inpatient Hospital Stay (HOSPITAL_BASED_OUTPATIENT_CLINIC_OR_DEPARTMENT_OTHER)
Admission: EM | Admit: 2024-10-12 | Discharge: 2024-10-19 | DRG: 291 | Disposition: A | Discharge status: Home Health | Attending: Internal Medicine | Admitting: Internal Medicine

## 2024-10-12 ENCOUNTER — Other Ambulatory Visit: Payer: Self-pay

## 2024-10-12 DIAGNOSIS — I5043 Acute on chronic combined systolic (congestive) and diastolic (congestive) heart failure: Secondary | ICD-10-CM

## 2024-10-12 DIAGNOSIS — I2583 Coronary atherosclerosis due to lipid rich plaque: Secondary | ICD-10-CM | POA: Diagnosis not present

## 2024-10-12 DIAGNOSIS — I2510 Atherosclerotic heart disease of native coronary artery without angina pectoris: Secondary | ICD-10-CM | POA: Diagnosis present

## 2024-10-12 DIAGNOSIS — J811 Chronic pulmonary edema: Secondary | ICD-10-CM | POA: Diagnosis not present

## 2024-10-12 DIAGNOSIS — Z79899 Other long term (current) drug therapy: Secondary | ICD-10-CM | POA: Diagnosis not present

## 2024-10-12 DIAGNOSIS — N179 Acute kidney failure, unspecified: Secondary | ICD-10-CM | POA: Diagnosis present

## 2024-10-12 DIAGNOSIS — J810 Acute pulmonary edema: Secondary | ICD-10-CM | POA: Diagnosis not present

## 2024-10-12 DIAGNOSIS — E7849 Other hyperlipidemia: Secondary | ICD-10-CM | POA: Diagnosis present

## 2024-10-12 DIAGNOSIS — I10 Essential (primary) hypertension: Secondary | ICD-10-CM | POA: Diagnosis present

## 2024-10-12 DIAGNOSIS — Z9981 Dependence on supplemental oxygen: Secondary | ICD-10-CM

## 2024-10-12 DIAGNOSIS — Z833 Family history of diabetes mellitus: Secondary | ICD-10-CM | POA: Diagnosis not present

## 2024-10-12 DIAGNOSIS — Z951 Presence of aortocoronary bypass graft: Secondary | ICD-10-CM

## 2024-10-12 DIAGNOSIS — Z8249 Family history of ischemic heart disease and other diseases of the circulatory system: Secondary | ICD-10-CM

## 2024-10-12 DIAGNOSIS — E1122 Type 2 diabetes mellitus with diabetic chronic kidney disease: Secondary | ICD-10-CM | POA: Diagnosis present

## 2024-10-12 DIAGNOSIS — I5033 Acute on chronic diastolic (congestive) heart failure: Secondary | ICD-10-CM | POA: Diagnosis present

## 2024-10-12 DIAGNOSIS — N1832 Chronic kidney disease, stage 3b: Secondary | ICD-10-CM | POA: Diagnosis present

## 2024-10-12 DIAGNOSIS — J9811 Atelectasis: Secondary | ICD-10-CM | POA: Diagnosis not present

## 2024-10-12 DIAGNOSIS — E785 Hyperlipidemia, unspecified: Secondary | ICD-10-CM | POA: Diagnosis not present

## 2024-10-12 DIAGNOSIS — Z7901 Long term (current) use of anticoagulants: Secondary | ICD-10-CM | POA: Diagnosis not present

## 2024-10-12 DIAGNOSIS — Z87891 Personal history of nicotine dependence: Secondary | ICD-10-CM | POA: Diagnosis not present

## 2024-10-12 DIAGNOSIS — I272 Pulmonary hypertension, unspecified: Secondary | ICD-10-CM | POA: Diagnosis present

## 2024-10-12 DIAGNOSIS — Z888 Allergy status to other drugs, medicaments and biological substances status: Secondary | ICD-10-CM

## 2024-10-12 DIAGNOSIS — D509 Iron deficiency anemia, unspecified: Secondary | ICD-10-CM | POA: Diagnosis present

## 2024-10-12 DIAGNOSIS — I130 Hypertensive heart and chronic kidney disease with heart failure and stage 1 through stage 4 chronic kidney disease, or unspecified chronic kidney disease: Principal | ICD-10-CM | POA: Diagnosis present

## 2024-10-12 DIAGNOSIS — R0989 Other specified symptoms and signs involving the circulatory and respiratory systems: Secondary | ICD-10-CM | POA: Diagnosis not present

## 2024-10-12 DIAGNOSIS — D631 Anemia in chronic kidney disease: Secondary | ICD-10-CM | POA: Diagnosis present

## 2024-10-12 DIAGNOSIS — E119 Type 2 diabetes mellitus without complications: Secondary | ICD-10-CM

## 2024-10-12 DIAGNOSIS — I2584 Coronary atherosclerosis due to calcified coronary lesion: Secondary | ICD-10-CM | POA: Diagnosis not present

## 2024-10-12 DIAGNOSIS — I4892 Unspecified atrial flutter: Secondary | ICD-10-CM | POA: Diagnosis present

## 2024-10-12 DIAGNOSIS — Z66 Do not resuscitate: Secondary | ICD-10-CM | POA: Diagnosis present

## 2024-10-12 DIAGNOSIS — E876 Hypokalemia: Secondary | ICD-10-CM | POA: Diagnosis present

## 2024-10-12 DIAGNOSIS — I509 Heart failure, unspecified: Principal | ICD-10-CM

## 2024-10-12 DIAGNOSIS — E1169 Type 2 diabetes mellitus with other specified complication: Secondary | ICD-10-CM | POA: Diagnosis present

## 2024-10-12 DIAGNOSIS — E1165 Type 2 diabetes mellitus with hyperglycemia: Secondary | ICD-10-CM | POA: Diagnosis present

## 2024-10-12 DIAGNOSIS — R042 Hemoptysis: Secondary | ICD-10-CM | POA: Diagnosis present

## 2024-10-12 DIAGNOSIS — R918 Other nonspecific abnormal finding of lung field: Secondary | ICD-10-CM | POA: Diagnosis not present

## 2024-10-12 DIAGNOSIS — I110 Hypertensive heart disease with heart failure: Secondary | ICD-10-CM | POA: Diagnosis not present

## 2024-10-12 DIAGNOSIS — Z953 Presence of xenogenic heart valve: Secondary | ICD-10-CM

## 2024-10-12 DIAGNOSIS — J9621 Acute and chronic respiratory failure with hypoxia: Secondary | ICD-10-CM | POA: Diagnosis present

## 2024-10-12 DIAGNOSIS — J949 Pleural condition, unspecified: Secondary | ICD-10-CM | POA: Diagnosis not present

## 2024-10-12 DIAGNOSIS — Z794 Long term (current) use of insulin: Secondary | ICD-10-CM

## 2024-10-12 DIAGNOSIS — I480 Paroxysmal atrial fibrillation: Secondary | ICD-10-CM | POA: Diagnosis present

## 2024-10-12 DIAGNOSIS — J90 Pleural effusion, not elsewhere classified: Secondary | ICD-10-CM | POA: Diagnosis not present

## 2024-10-12 DIAGNOSIS — Z83438 Family history of other disorder of lipoprotein metabolism and other lipidemia: Secondary | ICD-10-CM

## 2024-10-12 DIAGNOSIS — D72829 Elevated white blood cell count, unspecified: Secondary | ICD-10-CM | POA: Diagnosis present

## 2024-10-12 DIAGNOSIS — I081 Rheumatic disorders of both mitral and tricuspid valves: Secondary | ICD-10-CM | POA: Diagnosis present

## 2024-10-12 LAB — BASIC METABOLIC PANEL WITH GFR
Anion gap: 15 (ref 5–15)
BUN: 58 mg/dL — ABNORMAL HIGH (ref 8–23)
CO2: 23 mmol/L (ref 22–32)
Calcium: 8.9 mg/dL (ref 8.9–10.3)
Chloride: 100 mmol/L (ref 98–111)
Creatinine, Ser: 2.02 mg/dL — ABNORMAL HIGH (ref 0.61–1.24)
GFR, Estimated: 31 mL/min — ABNORMAL LOW (ref >60)
Glucose, Bld: 214 mg/dL — ABNORMAL HIGH (ref 70–99)
Potassium: 3.7 mmol/L (ref 3.5–5.1)
Sodium: 138 mmol/L (ref 135–145)

## 2024-10-12 LAB — CBC
HCT: 31 % — ABNORMAL LOW (ref 39.0–52.0)
Hemoglobin: 10.2 g/dL — ABNORMAL LOW (ref 13.0–17.0)
MCH: 28.7 pg (ref 26.0–34.0)
MCHC: 32.9 g/dL (ref 30.0–36.0)
MCV: 87.1 fL (ref 80.0–100.0)
Platelets: 236 K/uL (ref 150–400)
RBC: 3.56 MIL/uL — ABNORMAL LOW (ref 4.22–5.81)
RDW: 15.1 % (ref 11.5–15.5)
WBC: 14 K/uL — ABNORMAL HIGH (ref 4.0–10.5)
nRBC: 0 % (ref 0.0–0.2)

## 2024-10-12 LAB — HEMOGLOBIN A1C
Hgb A1c MFr Bld: 8.2 % — ABNORMAL HIGH (ref 4.8–5.6)
Mean Plasma Glucose: 189 mg/dL

## 2024-10-12 LAB — TROPONIN T, HIGH SENSITIVITY
Troponin T High Sensitivity: 74 ng/L — ABNORMAL HIGH (ref 0–19)
Troponin T High Sensitivity: 63 ng/L — ABNORMAL HIGH (ref 0–19)

## 2024-10-12 LAB — CREATININE, SERUM
Creatinine, Ser: 2 mg/dL — ABNORMAL HIGH (ref 0.61–1.24)
GFR, Estimated: 31 mL/min — ABNORMAL LOW (ref >60)

## 2024-10-12 LAB — PROTIME-INR
INR: 1.6 — ABNORMAL HIGH (ref 0.8–1.2)
Prothrombin Time: 19.7 s — ABNORMAL HIGH (ref 11.4–15.2)

## 2024-10-12 LAB — PRO BRAIN NATRIURETIC PEPTIDE: Pro Brain Natriuretic Peptide: 7326 pg/mL — ABNORMAL HIGH (ref <300.0)

## 2024-10-12 LAB — GLUCOSE, CAPILLARY
Glucose-Capillary: 213 mg/dL — ABNORMAL HIGH (ref 70–99)
Glucose-Capillary: 256 mg/dL — ABNORMAL HIGH (ref 70–99)

## 2024-10-12 MED ORDER — SODIUM CHLORIDE 0.9% FLUSH
3.0000 mL | Freq: Two times a day (BID) | INTRAVENOUS | Status: DC
  Administered 2024-10-12 – 2024-10-19 (×14): 3 mL via INTRAVENOUS

## 2024-10-12 MED ORDER — ONDANSETRON HCL 4 MG PO TABS: 4.0000 mg | ORAL_TABLET | Freq: Four times a day (QID) | ORAL | Status: DC | PRN

## 2024-10-12 MED ORDER — APIXABAN 2.5 MG PO TABS
2.5000 mg | ORAL_TABLET | Freq: Two times a day (BID) | ORAL | Status: DC
  Administered 2024-10-12 – 2024-10-19 (×14): 2.5 mg via ORAL
  Filled 2024-10-12 (×14): qty 1

## 2024-10-12 MED ORDER — AMIODARONE HCL 200 MG PO TABS
200.0000 mg | ORAL_TABLET | Freq: Every day | ORAL | Status: DC
  Administered 2024-10-13 – 2024-10-19 (×7): 200 mg via ORAL
  Filled 2024-10-12 (×7): qty 1

## 2024-10-12 MED ORDER — FUROSEMIDE 10 MG/ML IJ SOLN: 40.0000 mg | Freq: Two times a day (BID) | INTRAMUSCULAR | Status: DC

## 2024-10-12 MED ORDER — HYDROCODONE-ACETAMINOPHEN 5-325 MG PO TABS: 1.0000 | ORAL_TABLET | ORAL | Status: DC | PRN

## 2024-10-12 MED ORDER — ATORVASTATIN CALCIUM 40 MG PO TABS
40.0000 mg | ORAL_TABLET | Freq: Every day | ORAL | Status: DC
  Administered 2024-10-12 – 2024-10-18 (×7): 40 mg via ORAL
  Filled 2024-10-12 (×7): qty 1

## 2024-10-12 MED ORDER — INSULIN GLARGINE-YFGN 100 UNIT/ML ~~LOC~~ SOLN
5.0000 [IU] | Freq: Every day | SUBCUTANEOUS | Status: DC
  Administered 2024-10-12 – 2024-10-18 (×7): 5 [IU] via SUBCUTANEOUS
  Filled 2024-10-12 (×8): qty 0.05

## 2024-10-12 MED ORDER — AMLODIPINE BESYLATE 10 MG PO TABS: 10.0000 mg | ORAL_TABLET | Freq: Every day | ORAL | Status: DC

## 2024-10-12 MED ORDER — ENOXAPARIN SODIUM 30 MG/0.3ML IJ SOSY
30.0000 mg | PREFILLED_SYRINGE | INTRAMUSCULAR | Status: DC
  Administered 2024-10-12: 30 mg via SUBCUTANEOUS
  Filled 2024-10-12: qty 0.3

## 2024-10-12 MED ORDER — PREDNISOLONE ACETATE 1 % OP SUSP
1.0000 [drp] | OPHTHALMIC | Status: DC
  Administered 2024-10-13 – 2024-10-16 (×16): 1 [drp] via OPHTHALMIC
  Filled 2024-10-12 (×2): qty 5

## 2024-10-12 MED ORDER — MORPHINE SULFATE (PF) 2 MG/ML IV SOLN: 1.0000 mg | INTRAVENOUS | Status: DC | PRN

## 2024-10-12 MED ORDER — INSULIN ASPART 100 UNIT/ML IJ SOLN
0.0000 [IU] | Freq: Three times a day (TID) | INTRAMUSCULAR | Status: DC
  Administered 2024-10-13 – 2024-10-15 (×5): 1 [IU] via SUBCUTANEOUS
  Administered 2024-10-15: 2 [IU] via SUBCUTANEOUS
  Administered 2024-10-15 – 2024-10-16 (×2): 1 [IU] via SUBCUTANEOUS
  Administered 2024-10-16: 2 [IU] via SUBCUTANEOUS
  Administered 2024-10-17 – 2024-10-19 (×6): 1 [IU] via SUBCUTANEOUS
  Filled 2024-10-12: qty 1
  Filled 2024-10-12 (×2): qty 2
  Filled 2024-10-12: qty 1
  Filled 2024-10-12: qty 6
  Filled 2024-10-12 (×8): qty 1

## 2024-10-12 MED ORDER — FUROSEMIDE 10 MG/ML IJ SOLN
60.0000 mg | Freq: Once | INTRAMUSCULAR | Status: AC
  Administered 2024-10-12: 60 mg via INTRAVENOUS
  Filled 2024-10-12: qty 6

## 2024-10-12 MED ORDER — BISACODYL 5 MG PO TBEC: 5.0000 mg | DELAYED_RELEASE_TABLET | Freq: Every day | ORAL | Status: DC | PRN

## 2024-10-12 MED ORDER — ACETAMINOPHEN 500 MG PO TABS: 500.0000 mg | ORAL_TABLET | Freq: Four times a day (QID) | ORAL | Status: DC | PRN

## 2024-10-12 MED ORDER — ONDANSETRON HCL 4 MG/2ML IJ SOLN: 4.0000 mg | Freq: Four times a day (QID) | INTRAMUSCULAR | Status: DC | PRN

## 2024-10-12 NOTE — Plan of Care (Signed)
 Hospital Medicine Transfer Accept Note Patient Name/Age: Peter Scott / 89 y.o. MRN: [id number] Admission Date: 10/12/2024  Once successfully transferred to the appropriate floor, [facility] will assume care for the patient above.  A/P: 58M h/o hypertension, type 2 diabetes mellitus, CAD status post CABG, history of TAVR, atrial fibrillation on Eliquis , chronic HFpEF, and pulmonary hypertension p/w SOB/DOE and elevated BNP c/w HFpEF exacerbation. [facility] ED requested MD admit for diuresis after giving IV lasix  60mg  x1 in ED. Will need Iv diuresis, TTE and possible Cards eval on arrival.    Marsha Ada, MD Attending Physician Division of [facility] Medicine [facility] October 12, 2024 11:41 AM

## 2024-10-12 NOTE — ED Provider Notes (Signed)
 [place] [facility] DE[facility]MENT AT [facility] Provider Note   CSN: [id number] Arrival date & time: 10/12/24  9260     Patient presents with: Shortness of Breath and Leg Swelling   Peter Scott is a 89 y.o. male.   89 year old male presenting emergency department for shortness of breath.  Worsened over the past week.  More dyspneic on exertion, orthopneic past 2 days.  Reports taking half the dose of his torsemide  lately as he reports he felt like it made his heart beat abnormally.  Denying chest pain.  No fevers or chills.  Worsening lower extremity edema   Shortness of Breath      Prior to Admission medications   Medication Sig Start Date End Date Taking? Authorizing Provider  acetaminophen  (TYLENOL ) 500 MG tablet Take 500 mg by mouth every 6 (six) hours as needed for fever or headache (pain).    [provider]  amiodarone  (PACERONE ) 200 MG tablet Take 1 tablet (200 mg total) by mouth daily. 08/13/18   Pietro Redell RAMAN, MD  amLODipine  (NORVASC ) 10 MG tablet Take 10 mg by mouth daily before breakfast.    [provider]  amoxicillin  (AMOXIL ) 500 MG capsule Take 4 capsules (2,000 mg total) by mouth one hour prior to dental procedure. 09/25/22     apixaban  (ELIQUIS ) 2.5 MG TABS tablet Take 1 tablet (2.5 mg total) by mouth 2 (two) times daily. 01/28/20   Pietro Redell RAMAN, MD  atorvastatin  (LIPITOR) 40 MG tablet Take 40 mg by mouth at bedtime.    [provider]  furosemide  (LASIX ) 20 MG tablet 80 mg in the morning and 40 mg in the afternoon 09/16/24   Pietro Redell RAMAN, MD  glucose blood (CONTOUR NEXT TEST) test strip Use once a day to check blood sugar.  Dx code: E11.9 02/08/21   Copland, Harlene BROCKS, MD  insulin  glargine (SEMGLEE , YFGN,) 100 UNIT/ML Solostar Pen Inject 11 Units into the skin at bedtime.    [provider]  losartan  (COZAAR ) 100 MG tablet Take 0.5 tablets (50 mg total) by mouth 2 (two) times daily. 08/05/18   Copland,  Harlene BROCKS, MD  OXYGEN  Inhale 1 L/min into the lungs daily as needed.    [provider]  potassium chloride  SA (KLOR-CON  M) 20 MEQ tablet Take 1 tablet (20 mEq total) by mouth daily. 07/20/24 10/18/24  Laurence Locus, DO    Allergies: Glucotrol [glipizide]    Review of Systems  Respiratory:  Positive for shortness of breath.     Updated Vital Signs BP (!) 145/92 (BP Location: Right Arm)   Pulse 65   Temp 97.6 F (36.4 C) (Oral)   Resp (!) 24   Wt 67.6 kg   SpO2 96%   BMI 22.01 kg/m   Physical Exam Vitals and nursing note reviewed.  Constitutional:      General: He is not in acute distress.    Appearance: He is not toxic-appearing.  HENT:     Head: Normocephalic.  Cardiovascular:     Rate and Rhythm: Normal rate. Rhythm irregular.  Pulmonary:     Effort: Pulmonary effort is normal. Tachypnea present.     Breath sounds: Rales present.  Musculoskeletal:     Cervical back: Normal range of motion.     Right lower leg: Edema present.     Left lower leg: Edema present.  Skin:    General: Skin is warm and dry.     Capillary Refill: Capillary refill takes less  than 2 seconds.  Neurological:     Mental Status: He is alert.  Psychiatric:        Mood and Affect: Mood normal.        Behavior: Behavior normal.     (all labs ordered are listed, but only abnormal results are displayed) Labs Reviewed  BASIC METABOLIC PANEL WITH GFR - Abnormal; Notable for the following components:      Result Value   Glucose, Bld 214 (*)    BUN 58 (*)    Creatinine, Ser 2.02 (*)    GFR, Estimated 31 (*)    All other components within normal limits  CBC - Abnormal; Notable for the following components:   WBC 14.0 (*)    RBC 3.56 (*)    Hemoglobin 10.2 (*)    HCT 31.0 (*)    All other components within normal limits  PROTIME-INR - Abnormal; Notable for the following components:   Prothrombin Time 19.7 (*)    INR 1.6 (*)    All other components within normal limits  PRO BRAIN  NATRIURETIC PEPTIDE - Abnormal; Notable for the following components:   Pro Brain Natriuretic Peptide 7,326.0 (*)    All other components within normal limits  TROPONIN T, HIGH SENSITIVITY - Abnormal; Notable for the following components:   Troponin T High Sensitivity 74 (*)    All other components within normal limits    EKG: EKG Interpretation Date/Time:  Monday October 12 2024 08:00:51 EST Ventricular Rate:  73 PR Interval:    QRS Duration:  114 QT Interval:  470 QTC Calculation: 518 R Axis:   -1  Text Interpretation: Atrial flutter LVH with secondary repolarization abnormality Anterior Q waves, possibly due to LVH Prolonged QT interval Confirmed by Neysa Clap [phone]) on 10/12/2024 9:03:34 AM  Radiology: [facility] Chest Port 1 View Result Date: 10/12/2024 EXAM: 1 VIEW(S) XRAY OF THE CHEST 10/12/2024 08:20:00 AM COMPARISON: 07/23/2024 CLINICAL HISTORY: 89 year old male with shortness of breath. FINDINGS: LINES, TUBES AND DEVICES: TAVR (Transcatheter Aortic Valve Replacement) noted LUNGS AND PLEURA: Low lung volumes. Small bilateral pleural effusions. Pulmonary interstitial edema, symmetric bilaterally. No pneumothorax. HEART AND MEDIASTINUM: Chronic cardiomegaly. Atherosclerotic calcifications. Median sternotomy noted. Superimposed moderate chronic gastric hiatal hernia. BONES AND SOFT TISSUES: No acute osseous abnormality. IMPRESSION: 1. Acute pulmonary edema with Small bilateral pleural effusions. Electronically signed by: Helayne Hurst MD 10/12/2024 08:25 AM EST RP Workstation: [id number]     .Critical Care  Performed by: Neysa Clap PARAS, DO Authorized by: Neysa Clap PARAS, DO   Critical care provider statement:    Critical care time (minutes):  30   Critical care was necessary to treat or prevent imminent or life-threatening deterioration of the following conditions:  Cardiac failure and respiratory failure   Critical care was time spent personally by me on the following activities:   Development of treatment plan with patient or surrogate, discussions with consultants, evaluation of patient's response to treatment, examination of patient, ordering and review of laboratory studies, ordering and review of radiographic studies, ordering and performing treatments and interventions, pulse oximetry, re-evaluation of patient's condition and review of old charts    Medications Ordered in the ED  furosemide  (LASIX ) injection 60 mg (60 mg Intravenous Given 10/12/24 0900)    Clinical Course as of 10/12/24 0923  Mon Oct 12, 2024  0904 CBC(!) Leukocytosis noted, stable anemia when compared to prior [TY]  0904 Basic metabolic panel(!) No electrolyte abnormalities.  Appears to be baseline renal function compared  to prior labs [TY]  0904 Pro Brain Natriuretic Peptide(!): 7,326.0 Consistent with acute CHF [TY]  0904 Troponin T High Sensitivity(!): 74 Likely related to CHF his EKG without ischemic changes. [TY]  [id number] Spoke with Dr. Georgina, hospitalist who agrees to admit patient. [TY]    Clinical Course User Index [TY] Neysa Caron PARAS, DO                                 Medical Decision Making This is a 89 year old male presenting emergency department for shortness of breath.  History of hypertension hyperlipidemia, diabetes, CAD, prior TAVR, CHF.  Appears to be rate controlled A-fib on the monitor, slightly tachypneic on exam with crackles.  He was hypoxic 84% in triage, placed on 4 L with improvement of his oxygen  saturation to the upper 90s.  Concern for acute CHF given he has been taking half doses of his diuretics.  See ED course for further MDM and final disposition.   Amount and/or Complexity of Data Reviewed Independent Historian:     Details: Son notes that his legs are significantly more swollen than baseline External Data Reviewed:     Details: Echo in October 1. Left ventricular ejection fraction, by estimation, is 45 to 50%. The  left ventricle has mildly decreased  function. The left ventricle has no  regional wall motion abnormalities. Left ventricular diastolic parameters  are consistent with Grade II  diastolic dysfunction (pseudonormalization). The average left ventricular  global longitudinal strain is -13.8 %. The global longitudinal strain is  abnormal.   Labs: ordered. Decision-making details documented in ED Course. Radiology: ordered and independent interpretation performed.    Details: X-ray reviewed, appears to be with vascular congestion/pulmonary edema.  No pneumothorax. ECG/medicine tests: independent interpretation performed.    Details: Appears to be rate controlled atrial fibrillation.  No ischemic changes. Discussion of management or test interpretation with external provider(s): Hospitalist for admission  Risk Prescription drug management. Decision regarding hospitalization. Diagnosis or treatment significantly limited by social determinants of health.       Final diagnoses:  None    ED Discharge Orders     None          Neysa Caron PARAS, [place] 10/12/24 9076

## 2024-10-12 NOTE — ED Notes (Signed)
 Pt up on side of bed to eat mac and cheese and  ginger ale family in room

## 2024-10-12 NOTE — ED Notes (Signed)
 RT assessed patient upon arrival. SOB noted and SAT 84%. Placed on 4LNC. BBS crackles, history of CHF. RT to monitor as needed.

## 2024-10-12 NOTE — Plan of Care (Signed)
  Problem: Education: Goal: Knowledge of General Education information will improve Description: Including pain rating scale, medication(s)/side effects and non-pharmacologic comfort measures Outcome: Progressing   Problem: Clinical Measurements: Goal: Diagnostic test results will improve Outcome: Not Progressing

## 2024-10-12 NOTE — Progress Notes (Signed)
   10/12/24 1626  Vitals  Temp 97.6 F (36.4 C)  Temp Source Oral  BP (!) 151/77  MAP (mmHg) 99  BP Location Left Arm  BP Method Automatic  Patient Position (if appropriate) Sitting  Pulse Rate 79  Pulse Rate Source Monitor  ECG Heart Rate 79  Resp 20  Level of Consciousness  Level of Consciousness Alert  MEWS COLOR  MEWS Score Color Green  Oxygen  Therapy  SpO2 94 %  O2 Device Nasal Cannula  O2 Flow Rate (L/min) 4 L/min  MEWS Score  MEWS Temp 0  MEWS Systolic 0  MEWS Pulse 0  MEWS RR 0  MEWS LOC 0  MEWS Score 0   PT. Admitted to unit and oriented to unit. CCMD called for telemetry monitoring.

## 2024-10-12 NOTE — ED Notes (Signed)
 Pt sitting on side of bed states when he coughs it is hard to breath

## 2024-10-12 NOTE — ED Triage Notes (Signed)
 Pt brought in by family. Reports SOB episodes over the past few weeks. Worse with exertion. Feet noted to be swollen this morning. Pt reports he has been breaking fluid medication in half over a week ago but didn't notify his PCP. States the pill was messing with his heart. Dyspneic upon arrival. Denies pain Initial sat 84 % RT at bedside. O2 initiated

## 2024-10-12 NOTE — ED Notes (Signed)
 Called [facility] for transport to [facility] @13 :19.  Spoke with Tinnie

## 2024-10-12 NOTE — H&P (Signed)
 History and Physical    Peter Scott FMW:[id number] DOB: 24-Feb-1932 DOA: 10/12/2024  PCP: Watt Harlene BROCKS, MD  Patient coming from: free standing ER   Chief Complaint: shortness of breath   HPI: 89 y/o M w/ PMH of CHF, chronic hypoxic respiratory failure on 1 L [place] at home, HTN, HLD, a. fib on eliquis , CKDIIIb who presented w/ shortness of breath x 1 day prior to admission. Pt has shortness of breath at rest as well as with exertion. Pt does not monitor fluid and/or salt intake. Pt dose admit to drinking a lot lately secondary to having dry mouth. Pt denies any fever, chills, sweating, chest pain, nausea, vomiting, abd pain, dysuria, urinary frequency, urinary urgency, diarrhea, or constipation. Pt does c/o cough x 6 months.   Review of Systems: As per HPI otherwise 14 point review of systems negative.   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   26 mm Edwards Sapien 3 transcatheter heart valve placed via percutaneous right transfemoral approach    Umbilical hernia     Past Surgical History:  Procedure Laterality Date   ABLATION     Rhythm problem; rhythm unknown; [place]   CARDIOVERSION N/A 07/14/2018   Procedure: CARDIOVERSION;  Surgeon: Moose Leim DEL, MD;  Location: [facility] ENDOSCOPY;  Service: Cardiovascular;  Laterality: N/A;   CARDIOVERSION N/A 08/27/2018   Procedure: CARDIOVERSION;  Surgeon: Jeffrie Oneil BROCKS, MD;  Location: [facility] ENDOSCOPY;  Service: Cardiovascular;  Laterality: N/A;   CORONARY ARTERY BYPASS GRAFT  2001   EXPLORATION POST OPERATIVE OPEN HEART  2001, June   INTRAOPERATIVE TRANSTHORACIC ECHOCARDIOGRAM N/A  04/01/2018   Procedure: INTRAOPERATIVE TRANSTHORACIC ECHOCARDIOGRAM;  Surgeon: Verlin Lonni BIRCH, MD;  Location: [facility] OR;  Service: Open Heart Surgery;  Laterality: N/A;   RIGHT/LEFT HEART CATH AND CORONARY/GRAFT ANGIOGRAPHY N/A 01/31/2018   Procedure: RIGHT/LEFT HEART CATH AND CORONARY/GRAFT ANGIOGRAPHY;  Surgeon: Verlin Lonni BIRCH, MD;  Location: [facility] INVASIVE CV LAB;  Service: Cardiovascular;  Laterality: N/A;   TEE WITHOUT CARDIOVERSION N/A 01/17/2018   Procedure: TRANSESOPHAGEAL ECHOCARDIOGRAM (TEE);  Surgeon: Pietro Redell RAMAN, MD;  Location: [facility] ENDOSCOPY;  Service: Cardiovascular;  Laterality: N/A;   TEE WITHOUT CARDIOVERSION N/A 07/14/2018   Procedure: TRANSESOPHAGEAL ECHOCARDIOGRAM (TEE);  Surgeon: Moose Leim DEL, MD;  Location: [facility] ENDOSCOPY;  Service: Cardiovascular;  Laterality: N/A;   TONSILLECTOMY     TRANSCATHETER AORTIC VALVE REPLACEMENT, TRANSFEMORAL N/A 04/01/2018   Procedure: TRANSCATHETER AORTIC VALVE REPLACEMENT, TRANSFEMORAL;  Surgeon: Verlin Lonni BIRCH, MD;  Location: [facility] OR;  Service: Open Heart Surgery;  Laterality: N/A;   UMBILICAL HERNIA REPAIR  2015     reports that he has quit smoking. His smoking use included cigarettes. He has a 3 pack-year smoking history. He has never used smokeless tobacco. He reports that he does not drink alcohol and does not use drugs. Pt lives at home w/ wife and does not use a cane, walker or wheelchair.   Allergies  Allergen Reactions   Glucotrol [Glipizide] Palpitations and Other (See Comments)    Tachycardia  Family History  Problem Relation Age of Onset   Diabetes Mother 68       Deceased   Heart disease Father 2       Deceased   Heart disease Maternal Uncle    Diabetes Maternal Uncle    Heart disease Brother    Diabetes Brother    Diabetes Sister        #1   Heart disease Sister        #2   Hyperlipidemia Son        x2     Prior to Admission medications   Medication Sig Start Date End Date Taking?  Authorizing Provider  acetaminophen  (TYLENOL ) 500 MG tablet Take 500 mg by mouth every 6 (six) hours as needed for fever or headache (pain).   Yes [provider]  amiodarone  (PACERONE ) 200 MG tablet Take 1 tablet (200 mg total) by mouth daily. 08/13/18  Yes Pietro Redell RAMAN, MD  amLODipine  (NORVASC ) 10 MG tablet Take 10 mg by mouth daily before breakfast.   Yes [provider]  apixaban  (ELIQUIS ) 5 MG TABS tablet Take 2.5 mg by mouth 2 (two) times daily. 09/21/24  Yes [provider]  atorvastatin  (LIPITOR) 40 MG tablet Take 40 mg by mouth at bedtime.   Yes [provider]  furosemide  (LASIX ) 20 MG tablet 80 mg in the morning and 40 mg in the afternoon Patient taking differently: Take 20 mg by mouth. 20 mg 09/16/24  Yes Crenshaw, Redell RAMAN, MD  insulin  glargine (SEMGLEE , YFGN,) 100 UNIT/ML Solostar Pen Inject 11 Units into the skin at bedtime.   Yes [provider]  OXYGEN  Inhale 1 L/min into the lungs daily as needed.   Yes [provider]  potassium chloride  SA (KLOR-CON  M) 20 MEQ tablet Take 1 tablet (20 mEq total) by mouth daily. Patient taking differently: Take 10 mEq by mouth daily. 07/20/24 10/18/24 Yes Laurence Locus, DO  prednisoLONE  acetate (PRED FORTE ) 1 % ophthalmic suspension Place 1 drop into the right eye every 3 (three) hours. 09/02/24  Yes [provider]  torsemide  (DEMADEX ) 20 MG tablet Take by mouth. TAKE 2 TABLETS BY MOUTH EVERY MORNING AND TAKE 1 TABLET EVERY EVENING (MAY ADD 1 ADDITIONAL EVENING TABLET IF NEEDED) (THIS REPLACES FUROSEMIDE ) 09/25/24  Yes [provider]  amoxicillin  (AMOXIL ) 500 MG capsule Take 4 capsules (2,000 mg total) by mouth one hour prior to dental procedure. Patient not taking: Reported on 10/12/2024 09/25/22     apixaban  (ELIQUIS ) 2.5 MG TABS tablet Take 1 tablet (2.5 mg total) by mouth 2 (two) times daily. Patient not taking: Reported on 10/12/2024 01/28/20   Pietro Redell RAMAN, MD  glucose  blood (CONTOUR NEXT TEST) test strip Use once a day to check blood sugar.  Dx code: E11.9 02/08/21   Copland, Harlene BROCKS, MD  losartan  (COZAAR ) 100 MG tablet Take 0.5 tablets (50 mg total) by mouth 2 (two) times daily. 08/05/18   Watt Harlene BROCKS, MD    Physical Exam: Vitals:   10/12/24 0930 10/12/24 1339 10/12/24 1345 10/12/24 1626  BP: 139/63  135/69 (!) 151/77  Pulse: 72  68 79  Resp: 20  15 20   Temp: 97.6 F (36.4 C) 98.7 F (37.1 C) 98.7 F (37.1 C) 97.6 F (36.4 C)  TempSrc:  Oral  Oral  SpO2: 97%  93% 94%  Weight:    66.9 kg  Height:    5' 8 (1.727 m)    Constitutional: NAD, calm, comfortable  Vitals:   10/12/24 0930 10/12/24 1339 10/12/24 1345 10/12/24 1626  BP: 139/63  135/69 (!) 151/77  Pulse: 72  68 79  Resp: 20  15 20   Temp: 97.6 F (36.4 C) 98.7 F (37.1 C) 98.7 F (37.1 C) 97.6 F (36.4 C)  TempSrc:  Oral  Oral  SpO2: 97%  93% 94%  Weight:    66.9 kg  Height:    5' 8 (1.727 m)   Eyes: PERRL, lids and conjunctivae normal ENMT: Mucous membranes are dry.  Neck: normal, supple Respiratory: decreased breath sounds b/l. No wheezes or rales. Unable to talk in complete sentences Cardiovascular: S1/S2+. no rubs / gallops 2+ pitting edema b/l LE  Abdomen: soft, no tenderness, ND, normal bowel sounds  Musculoskeletal: no clubbing / cyanosis. No joint deformity upper and lower extremities.  Skin: no rashes, lesions, ulcers. Neurologic: CN 2-12 grossly intact.  Psychiatric: Normal judgment and insight. Alert and oriented x 3. Normal mood.    Labs on Admission: I have personally reviewed following labs and imaging studies  CBC: Recent Labs  Lab 10/12/24 0802  WBC 14.0*  HGB 10.2*  HCT 31.0*  MCV 87.1  PLT 236   Basic Metabolic Panel: Recent Labs  Lab 10/12/24 0802 10/12/24 1147  NA 138  --   K 3.7  --   CL 100  --   CO2 23  --   GLUCOSE 214*  --   BUN 58*  --   CREATININE 2.02* 2.00*  CALCIUM  8.9  --    GFR: Estimated Creatinine Clearance:  23.2 mL/min (A) (by C-G formula based on SCr of 2 mg/dL (H)). Liver Function Tests: No results for input(s): AST, ALT, ALKPHOS, BILITOT, PROT, ALBUMIN  in the last 168 hours. No results for input(s): LIPASE, AMYLASE in the last 168 hours. No results for input(s): AMMONIA in the last 168 hours. Coagulation Profile: Recent Labs  Lab 10/12/24 0802  INR 1.6*   Cardiac Enzymes: No results for input(s): CKTOTAL, CKMB, CKMBINDEX, TROPONINI in the last 168 hours. BNP (last 3 results) Recent Labs    07/14/24 0951 10/12/24 0802  PROBNP 4,291.0* 7,326.0*   HbA1C: No results for input(s): HGBA1C in the last 72 hours. CBG: Recent Labs  Lab 10/12/24 1644  GLUCAP 213*   Lipid Profile: No results for input(s): CHOL, HDL, LDLCALC, TRIG, CHOLHDL, LDLDIRECT in the last 72 hours. Thyroid  Function Tests: No results for input(s): TSH, T4TOTAL, FREET4, T3FREE, THYROIDAB in the last 72 hours. Anemia Panel: No results for input(s): VITAMINB12, FOLATE, FERRITIN, TIBC, IRON, RETICCTPCT in the last 72 hours. Urine analysis:    Component Value Date/Time   COLORURINE YELLOW 03/28/2018 1034   APPEARANCEUR CLEAR 03/28/2018 1034   LABSPEC 1.019 03/28/2018 1034   PHURINE 5.0 03/28/2018 1034   GLUCOSEU 50 (A) 03/28/2018 1034   GLUCOSEU 100 (A) 10/10/2015 0821   HGBUR NEGATIVE 03/28/2018 1034   BILIRUBINUR NEGATIVE 03/28/2018 1034   KETONESUR NEGATIVE 03/28/2018 1034   PROTEINUR NEGATIVE 03/28/2018 1034   UROBILINOGEN 0.2 10/10/2015 0821   NITRITE NEGATIVE 03/28/2018 1034   LEUKOCYTESUR NEGATIVE 03/28/2018 1034    Radiological Exams on Admission: DG Chest Port 1 View Result Date: 10/12/2024 EXAM: 1 VIEW(S) XRAY OF THE CHEST 10/12/2024 08:20:00 AM COMPARISON: 07/23/2024 CLINICAL HISTORY: 89 year old male with shortness of breath. FINDINGS: LINES, TUBES AND DEVICES: TAVR (Transcatheter Aortic Valve Replacement) noted LUNGS AND PLEURA: Low  lung volumes. Small bilateral pleural effusions. Pulmonary interstitial edema, symmetric bilaterally. No pneumothorax. HEART AND MEDIASTINUM: Chronic  cardiomegaly. Atherosclerotic calcifications. Median sternotomy noted. Superimposed moderate chronic gastric hiatal hernia. BONES AND SOFT TISSUES: No acute osseous abnormality. IMPRESSION: 1. Acute pulmonary edema with Small bilateral pleural effusions. Electronically signed by: Helayne Hurst MD 10/12/2024 08:25 AM EST RP Workstation: [id number]    EKG: Independently reviewed.   Assessment/Plan Principal Problem:   Acute on chronic heart failure (HCC)  Acute on chronic combined CHF: CXR shows pulmonary edema, BNP is 7,326, pitting edema & dyspnea. Continue on IV lasix . Holding home po lasix , torsemide . Monitor I/Os. Not on GDMT.   Acute on chronic hypoxic respiratory failure: currently 4L [place] & uses 1L [place] at home chronically. Continue on supplemental oxygen  and wean back to baseline as tolerated.  HLD: continue on statin  Likely PAF: continue on home dose of amio, eliquis   DM2: likely well controlled, HbA1c 7.5 in 10/2023. Started on SSI w/ accuchecks & reduced glargine dose while in the hospital  CKDIIIb: Cr is baseline. Avoid nephrotoxic meds  Leukocytosis: likely reactive. Will continue to monitor  ACD: likely secondary to CKD. No need for a transfusion currently      DVT prophylaxis: eliquis   Code Status: DNR Family Communication: no family at bedside Disposition Plan: depends on PT/OT recs  Consults called:  none Admission status: inpatient    Anthony CHRISTELLA Pouch MD [facility]  If 7PM-7AM, please contact night-coverage [email or web address]   10/12/2024, 5:42 PM

## 2024-10-13 DIAGNOSIS — I509 Heart failure, unspecified: Secondary | ICD-10-CM

## 2024-10-13 LAB — CBC
HCT: 26.8 % — ABNORMAL LOW (ref 39.0–52.0)
Hemoglobin: 8.8 g/dL — ABNORMAL LOW (ref 13.0–17.0)
MCH: 28.6 pg (ref 26.0–34.0)
MCHC: 32.8 g/dL (ref 30.0–36.0)
MCV: 87 fL (ref 80.0–100.0)
Platelets: 200 K/uL (ref 150–400)
RBC: 3.08 MIL/uL — ABNORMAL LOW (ref 4.22–5.81)
RDW: 14.9 % (ref 11.5–15.5)
WBC: 11.8 K/uL — ABNORMAL HIGH (ref 4.0–10.5)
nRBC: 0 % (ref 0.0–0.2)

## 2024-10-13 LAB — BASIC METABOLIC PANEL WITH GFR
Anion gap: 11 (ref 5–15)
BUN: 57 mg/dL — ABNORMAL HIGH (ref 8–23)
CO2: 26 mmol/L (ref 22–32)
Calcium: 8.2 mg/dL — ABNORMAL LOW (ref 8.9–10.3)
Chloride: 104 mmol/L (ref 98–111)
Creatinine, Ser: 1.87 mg/dL — ABNORMAL HIGH (ref 0.61–1.24)
GFR, Estimated: 34 mL/min — ABNORMAL LOW (ref >60)
Glucose, Bld: 149 mg/dL — ABNORMAL HIGH (ref 70–99)
Potassium: 3.3 mmol/L — ABNORMAL LOW (ref 3.5–5.1)
Sodium: 141 mmol/L (ref 135–145)

## 2024-10-13 LAB — GLUCOSE, CAPILLARY
Glucose-Capillary: 131 mg/dL — ABNORMAL HIGH (ref 70–99)
Glucose-Capillary: 171 mg/dL — ABNORMAL HIGH (ref 70–99)
Glucose-Capillary: 195 mg/dL — ABNORMAL HIGH (ref 70–99)
Glucose-Capillary: 204 mg/dL — ABNORMAL HIGH (ref 70–99)

## 2024-10-13 MED ORDER — FUROSEMIDE 10 MG/ML IJ SOLN
80.0000 mg | Freq: Two times a day (BID) | INTRAMUSCULAR | Status: DC
  Administered 2024-10-13 – 2024-10-14 (×3): 80 mg via INTRAVENOUS
  Filled 2024-10-13 (×3): qty 8

## 2024-10-13 MED ORDER — SALINE SPRAY 0.65 % NA SOLN
2.0000 | Freq: Two times a day (BID) | NASAL | Status: DC
  Administered 2024-10-14 – 2024-10-19 (×8): 2 via NASAL
  Filled 2024-10-13: qty 44

## 2024-10-13 MED ORDER — POTASSIUM CHLORIDE CRYS ER 20 MEQ PO TBCR
40.0000 meq | EXTENDED_RELEASE_TABLET | Freq: Two times a day (BID) | ORAL | Status: DC
  Administered 2024-10-13 – 2024-10-14 (×3): 40 meq via ORAL
  Filled 2024-10-13 (×3): qty 2

## 2024-10-13 NOTE — Plan of Care (Signed)
   Problem: Education: Goal: Knowledge of General Education information will improve Description: Including pain rating scale, medication(s)/side effects and non-pharmacologic comfort measures Outcome: Progressing   Problem: Coping: Goal: Level of anxiety will decrease Outcome: Progressing

## 2024-10-13 NOTE — Plan of Care (Signed)

## 2024-10-13 NOTE — TOC CM/SW Note (Addendum)
 Transition of Care [facility]) - Inpatient Brief Assessment   Patient Details  Name: Peter Scott MRN: [id number] Date of Birth: Dec 17, 1931  Transition of Care [facility]) CM/SW Contact:    Waddell Barnie Rama, RN Phone Number: 10/13/2024, 1:53 PM   Clinical Narrative: From home with spouse, has PCP and insurance on file, states has no HH services in place at this time , has home oxygen  with [employer] 1 liter at home.  States family member (son or grand daughter)  will transport them home at [employer] and family is support system, states gets [facility] from [facility].  Pta self ambulatory.    NCM contacted April with [facility] , left message for admission  information.  NCM spoke with Jermaine with [employer] to check on his portable oxygen , he will call this NCM back.    Transition of Care Asessment: Insurance and Status: Insurance coverage has been reviewed Patient has primary care physician: Yes Home environment has been reviewed: home with wife Prior level of function:: indep Prior/Current Home Services: Current home services (oxygen  1 liter with [employer]) Social Drivers of Health Review: SDOH reviewed no interventions necessary Readmission risk has been reviewed: Yes Transition of care needs: transition of care needs identified, TOC will continue to follow

## 2024-10-13 NOTE — Progress Notes (Signed)
 Mobility Specialist Progress Note:    10/13/24 1357  Mobility  Activity Ambulated with assistance  Level of Assistance Standby assist, set-up cues, supervision of patient - no hands on  Assistive Device None  Distance Ambulated (ft) 300 ft  Range of Motion/Exercises Active  Activity Response Tolerated well  Mobility Referral Yes  Mobility visit 1 Mobility  Mobility Specialist Start Time (ACUTE ONLY) 1357  Mobility Specialist Stop Time (ACUTE ONLY) 1414  Mobility Specialist Time Calculation (min) (ACUTE ONLY) 17 min   Received pt sitting in chair agreeable to session. No c/o any symptoms. Pt moving and and ambulating decently. Needing one standing break to recover. Returned pt to room w/ all needs met.   Venetia Keel Mobility Specialist Please Neurosurgeon or Rehab Office at [phone]

## 2024-10-13 NOTE — Progress Notes (Signed)
 Peter Scott  FMW:[id number] DOB: 08-Jun-1932 DOA: 10/12/2024 PCP: Watt Harlene BROCKS, MD    Brief Narrative:  89 year old with a history of chronic combined systolic and diastolic CHF, DM2, CAD status post CABG times 4 in 2001, history of TAVR 2019, pulmonary hypertension with chronic hypoxic respiratory failure on 1 L nasal cannula as outpatient, HTN, HLD, atrial fibrillation on Eliquis , and CKD stage IIIb who presented to [facility] 11/24 with rapidly progressive shortness of breath.  He admitted to forcibly increasing his fluid intake lately due to a dry mouth.  He denied fevers or chills.  CXR was suggestive of pulmonary edema.  Exam revealed pitting edema peripherally.  Goals of Care:   Code Status: Limited: Do not attempt resuscitation (DNR) -DNR-LIMITED -Do Not Intubate/DNI    DVT prophylaxis: apixaban  (ELIQUIS ) tablet 2.5 mg Start: 10/12/24 2200 SCDs Start: 10/12/24 1821 SCDs Start: 10/12/24 0923 apixaban  (ELIQUIS ) tablet 2.5 mg   Interim Hx: Afebrile since presentation.  Heart rate controlled in atrial fibrillation.  Blood pressure stable.  Oxygen  saturation 97% but requiring 4 L nasal cannula support.  States that he is feeling better but not yet back to his baseline.  Assessment & Plan:  Acute exacerbation of chronic combined systolic and diastolic CHF CXR at presentation noted pulmonary edema - TTE October 2025 noted EF 45-50% with grade 2 DD -continue diuresis -monitor output and weights  Filed Weights   10/12/24 0758 10/12/24 1626  Weight: 67.6 kg 66.9 kg     Acute on chronic hypoxic respiratory failure Due to pulmonary edema in the setting of pulmonary hypertension and CHF - requires 1 L nasal cannula support at home - wean oxygen  as able with ongoing diuresis  CAD status post CABG 2001 No chest pain at present  History of TAVR Valve stable via TTE October 2025  Chronic paroxysmal atrial fibrillation Continue usual home doses of amiodarone  and Eliquis   -rate controlled  DM2 Continue SSI -CBG improving -monitor trend today without change -A1c 8.2  Hypokalemia Due to use of diuretic -supplement and follow  CKD stage IIIb Baseline creatinine 1.9-2.0 - creatinine presently stable at baseline  Anemia of chronic kidney disease Hemoglobin has declined since presentation -no evidence of acute blood loss but patient is on anticoagulation - monitor hemoglobin trend   Family Communication: No family present at time of exam Disposition: PT/OT to evaluate   Objective: Blood pressure (!) 144/67, pulse 81, temperature 97.8 F (36.6 C), temperature source Oral, resp. rate (!) 26, height 5' 8 (1.727 m), weight 66.9 kg, SpO2 97%.  Intake/Output Summary (Last 24 hours) at 10/13/2024 0910 Last data filed at 10/13/2024 0800 Gross per 24 hour  Intake 120 ml  Output 3025 ml  Net -2905 ml   Filed Weights   10/12/24 0758 10/12/24 1626  Weight: 67.6 kg 66.9 kg    Examination: General: No acute respiratory distress Lungs: Fine bibasilar crackles without wheezing Cardiovascular: Regular rate and rhythm without murmur gallop or rub normal S1 and S2 Abdomen: Nontender, nondistended, soft, bowel sounds positive, no rebound, no ascites, no appreciable mass Extremities: 1+ bilateral lower extremity edema  CBC: Recent Labs  Lab 10/12/24 0802 10/13/24 0247  WBC 14.0* 11.8*  HGB 10.2* 8.8*  HCT 31.0* 26.8*  MCV 87.1 87.0  PLT 236 200   Basic Metabolic Panel: Recent Labs  Lab 10/12/24 0802 10/12/24 1147 10/13/24 0247  NA 138  --  141  K 3.7  --  3.3*  CL 100  --  104  CO2 23  --  26  GLUCOSE 214*  --  149*  BUN 58*  --  57*  CREATININE 2.02* 2.00* 1.87*  CALCIUM  8.9  --  8.2*   GFR: Estimated Creatinine Clearance: 24.8 mL/min (A) (by C-G formula based on SCr of 1.87 mg/dL (H)).   Scheduled Meds:  amiodarone   200 mg Oral Daily   apixaban   2.5 mg Oral BID   atorvastatin   40 mg Oral QHS   furosemide   40 mg Intravenous Q12H    insulin  aspart  0-6 Units Subcutaneous TID WC   insulin  glargine-yfgn  5 Units Subcutaneous QHS   prednisoLONE  acetate  1 drop Right Eye Q3H   sodium chloride  flush  3 mL Intravenous Q12H      LOS: 1 day   Reyes IVAR Moores, MD [facility] Office  [phone] Pager - Text Page per Tracey  If 7PM-7AM, please contact night-coverage per Amion 10/13/2024, 9:10 AM

## 2024-10-14 DIAGNOSIS — I5033 Acute on chronic diastolic (congestive) heart failure: Secondary | ICD-10-CM

## 2024-10-14 DIAGNOSIS — N1832 Chronic kidney disease, stage 3b: Secondary | ICD-10-CM

## 2024-10-14 DIAGNOSIS — I480 Paroxysmal atrial fibrillation: Secondary | ICD-10-CM

## 2024-10-14 DIAGNOSIS — I10 Essential (primary) hypertension: Secondary | ICD-10-CM

## 2024-10-14 DIAGNOSIS — I2510 Atherosclerotic heart disease of native coronary artery without angina pectoris: Secondary | ICD-10-CM

## 2024-10-14 DIAGNOSIS — I2584 Coronary atherosclerosis due to calcified coronary lesion: Secondary | ICD-10-CM

## 2024-10-14 LAB — RETICULOCYTES
Immature Retic Fract: 24.8 % — ABNORMAL HIGH (ref 2.3–15.9)
RBC.: 3 MIL/uL — ABNORMAL LOW (ref 4.22–5.81)
Retic Count, Absolute: 59.1 K/uL (ref 19.0–186.0)
Retic Ct Pct: 2 % (ref 0.4–3.1)

## 2024-10-14 LAB — CBC
HCT: 27 % — ABNORMAL LOW (ref 39.0–52.0)
Hemoglobin: 8.7 g/dL — ABNORMAL LOW (ref 13.0–17.0)
MCH: 28.2 pg (ref 26.0–34.0)
MCHC: 32.2 g/dL (ref 30.0–36.0)
MCV: 87.4 fL (ref 80.0–100.0)
Platelets: 206 K/uL (ref 150–400)
RBC: 3.09 MIL/uL — ABNORMAL LOW (ref 4.22–5.81)
RDW: 15.1 % (ref 11.5–15.5)
WBC: 11.6 K/uL — ABNORMAL HIGH (ref 4.0–10.5)
nRBC: 0 % (ref 0.0–0.2)

## 2024-10-14 LAB — BASIC METABOLIC PANEL WITH GFR
Anion gap: 11 (ref 5–15)
BUN: 53 mg/dL — ABNORMAL HIGH (ref 8–23)
CO2: 27 mmol/L (ref 22–32)
Calcium: 8.1 mg/dL — ABNORMAL LOW (ref 8.9–10.3)
Chloride: 101 mmol/L (ref 98–111)
Creatinine, Ser: 2.04 mg/dL — ABNORMAL HIGH (ref 0.61–1.24)
GFR, Estimated: 30 mL/min — ABNORMAL LOW (ref >60)
Glucose, Bld: 145 mg/dL — ABNORMAL HIGH (ref 70–99)
Potassium: 4 mmol/L (ref 3.5–5.1)
Sodium: 139 mmol/L (ref 135–145)

## 2024-10-14 LAB — IRON AND TIBC
Iron: 13 ug/dL — ABNORMAL LOW (ref 45–182)
Saturation Ratios: 4 % — ABNORMAL LOW (ref 17.9–39.5)
TIBC: 302 ug/dL (ref 250–450)
UIBC: 289 ug/dL

## 2024-10-14 LAB — GLUCOSE, CAPILLARY
Glucose-Capillary: 118 mg/dL — ABNORMAL HIGH (ref 70–99)
Glucose-Capillary: 164 mg/dL — ABNORMAL HIGH (ref 70–99)
Glucose-Capillary: 152 mg/dL — ABNORMAL HIGH (ref 70–99)
Glucose-Capillary: 230 mg/dL — ABNORMAL HIGH (ref 70–99)

## 2024-10-14 LAB — FERRITIN: Ferritin: 65 ng/mL (ref 24–336)

## 2024-10-14 LAB — VITAMIN B12: Vitamin B-12: 352 pg/mL (ref 180–914)

## 2024-10-14 LAB — FOLATE: Folate: 8.2 ng/mL (ref >5.9)

## 2024-10-14 LAB — MAGNESIUM: Magnesium: 2.1 mg/dL (ref 1.7–2.4)

## 2024-10-14 MED ORDER — ADULT MULTIVITAMIN W/MINERALS CH
1.0000 | ORAL_TABLET | Freq: Every day | ORAL | Status: DC
  Administered 2024-10-14 – 2024-10-19 (×5): 1 via ORAL
  Filled 2024-10-14 (×5): qty 1

## 2024-10-14 NOTE — Discharge Instructions (Addendum)
 Heart Healthy, Consistent Carbohydrate Nutrition Therapy   A heart-healthy and consistent carbohydrate diet is recommended to manage heart disease and diabetes. To follow a heart-healthy and consistent carbohydrate diet, Eat a balanced diet with whole grains, fruits and vegetables, and lean protein sources.  Choose heart-healthy unsaturated fats. Limit saturated fats, trans fats, and cholesterol intake. Eat more plant-based or vegetarian meals using beans and soy foods for protein.  Eat whole, unprocessed foods to limit the amount of sodium (salt) you eat.  Choose a consistent amount of carbohydrate at each meal and snack. Limit refined carbohydrates especially sugar, sweets and sugar-sweetened beverages.  If you drink alcohol, do so in moderation: one serving per day (women) and two servings per day (men). o One serving is equivalent to 12 ounces beer, 5 ounces wine, or 1.5 ounces distilled spirits  Tips Tips for Choosing Heart-Healthy Fats Choose lean protein and low-fat dairy foods to reduce saturated fat intake. Saturated fat is usually found in animal-based protein and is associated with certain health risks. Saturated fat is the biggest contributor to raise low-density lipoprotein (LDL) cholesterol levels. Research shows that limiting saturated fat lowers unhealthy cholesterol levels. Eat no more than 7% of your total calories each day from saturated fat. Ask your RDN to help you determine how much saturated fat is right for you. There are many foods that do not contain large amounts of saturated fats. Swapping these foods to replace foods high in saturated fats will help you limit the saturated fat you eat and improve your cholesterol levels. You can also try eating more plant-based or vegetarian meals. Instead of. Try:  Whole milk, cheese, yogurt, and ice cream 1% or skim milk, low-fat cheese, non-fat yogurt, and low-fat ice cream  Fatty, marbled beef and pork Lean beef, pork, or venison   Poultry with skin Poultry without skin  Butter, stick margarine Reduced-fat, whipped, or liquid spreads  Coconut oil, palm oil Liquid vegetable oils: corn, canola, olive, soybean and safflower oils   Avoid foods that contain trans fats. Trans fats increase levels of LDL-cholesterol. Hydrogenated fat in processed foods is the main source of trans fats in foods.  Trans fats can be found in stick margarine, shortening, processed sweets, baked goods, some fried foods, and packaged foods made with hydrogenated oils. Avoid foods with "partially hydrogenated oil" on the ingredient list such as: cookies, pastries, baked goods, biscuits, crackers, microwave popcorn, and frozen dinners. Choose foods with heart healthy fats. Polyunsaturated and monounsaturated fat are unsaturated fats that may help lower your blood cholesterol level when used in place of saturated fat in your diet. Ask your RDN about taking a dietary supplement with plant sterols and stanols to help lower your cholesterol level. Research shows that substituting saturated fats with unsaturated fats is beneficial to cholesterol levels. Try these easy swaps: Instead of. Try:  Butter, stick margarine, or solid shortening Reduced-fat, whipped, or liquid spreads  Beef, pork, or poultry with skin Fish and seafood  Chips, crackers, snack foods Raw or unsalted nuts and seeds or nut butters Hummus with vegetables Avocado on toast  Coconut oil, palm oil Liquid vegetable oils: corn, canola, olive, soybean and safflower oils  Limit the amount of cholesterol you eat to less than 200 milligrams per day. Cholesterol is a substance carried through the bloodstream via lipoproteins, which are known as "transporters" of fat. Some body functions need cholesterol to work properly, but too much cholesterol in the bloodstream can damage arteries and build up blood vessel linings (  which can lead to heart attack and stroke). You should eat less than 200 milligrams  cholesterol per day. People respond differently to eating cholesterol. There is no test available right now that can figure out which people will respond more to dietary cholesterol and which will respond less. For individuals with high intake of dietary cholesterol, different types of increase (none, small, moderate, large) in LDL-cholesterol levels are all possible.  Food sources of cholesterol include egg yolks and organ meats such as liver, gizzards. Limit egg yolks to two to four per week and avoid organ meats like liver and gizzards to control cholesterol intake. Tips for Choosing Heart-Healthy Carbohydrates Consume a consistent amount of carbohydrate It is important to eat foods with carbohydrates in moderation because they impact your blood glucose level. Carbohydrates can be found in many foods such as: Grains (breads, crackers, rice, pasta, and cereals)  Starchy Vegetables (potatoes, corn, and peas)  Beans and legumes  Milk, soy milk, and yogurt  Fruit and fruit juice  Sweets (cakes, cookies, ice cream, jam and jelly) Your RDN will help you set a goal for how many carbohydrate servings to eat at your meals and snacks. For many adults, eating 3 to 5 servings of carbohydrate foods at each meal and 1 or 2 carbohydrate servings for each snack works well.  Check your blood glucose level regularly. It can tell you if you need to adjust when you eat carbohydrates. Choose foods rich in viscous (soluble) fiber Viscous, or soluble, is found in the walls of plant cells. Viscous fiber is found only in plant-based foods. Eating foods with fiber helps to lower your unhealthy cholesterol and keep your blood glucose in range  Rich sources of viscous fiber include vegetables (asparagus, Brussels sprouts, sweet potatoes, turnips) fruit (apricots, mangoes, oranges), legumes, and whole grains (barley, oats, and oat bran).  As you increase your fiber intake gradually, also increase the amount of water you  drink. This will help prevent constipation.  If you have difficulty achieving this goal, ask your RDN about fiber laxatives. Choose fiber supplements made with viscous fibers such as psyllium seed husks or methylcellulose to help lower unhealthy cholesterol.  Limit refined carbohydrates  There are three types of carbohydrates: starches, sugar, and fiber. Some carbohydrates occur naturally in food, like the starches in rice or corn or the sugars in fruits and milk. Refined carbohydrates--foods with high amounts of simple sugars--can raise triglyceride levels. High triglyceride levels are associated with coronary heart disease. Some examples of refined carbohydrate foods are table sugar, sweets, and beverages sweetened with added sugar. Tips for Reducing Sodium (Salt) Although sodium is important for your body to function, too much sodium can be harmful for people with high blood pressure. As sodium and fluid buildup in your tissues and bloodstream, your blood pressure increases. High blood pressure may cause damage to other organs and increase your risk for a stroke. Even if you take a pill for blood pressure or a water pill (diuretic) to remove fluid, it is still important to have less salt in your diet. Ask your doctor and RDN what amount of sodium is right for you. Avoid processed foods. Eat more fresh foods.  Fresh fruits and vegetables are naturally low in sodium, as well as frozen vegetables and fruits that have no added juices or sauces.  Fresh meats are lower in sodium than processed meats, such as bacon, sausage, and hotdogs. Read the nutrition label or ask your butcher to help you find a  fresh meat that is low in sodium. Eat less salt--at the table and when cooking.  A single teaspoon of table salt has 2,300 mg of sodium.  Leave the salt out of recipes for pasta, casseroles, and soups.  Ask your RDN how to cook your favorite recipes without sodium Be a smart shopper.  Look for food packages  that say "salt-free" or "sodium-free." These items contain less than 5 milligrams of sodium per serving.  "Very low-sodium" products contain less than 35 milligrams of sodium per serving.  "Low-sodium" products contain less than 140 milligrams of sodium per serving.  Beware for "Unsalted" or "No Added Salt" products. These items may still be high in sodium. Check the nutrition label. Add flavors to your food without adding sodium.  Try lemon juice, lime juice, fruit juice or vinegar.  Dry or fresh herbs add flavor. Try basil, bay leaf, dill, rosemary, parsley, sage, dry mustard, nutmeg, thyme, and paprika.  Pepper, red pepper flakes, and cayenne pepper can add spice t your meals without adding sodium. Hot sauce contains sodium, but if you use just a drop or two, it will not add up to much.  Buy a sodium-free seasoning blend or make your own at home. Additional Lifestyle Tips Achieve and maintain a healthy weight. Talk with your RDN or your doctor about what is a healthy weight for you. Set goals to reach and maintain that weight.  To lose weight, reduce your calorie intake along with increasing your physical activity. A weight loss of 10 to 15 pounds could reduce LDL-cholesterol by 5 milligrams per deciliter. Participate in physical activity. Talk with your health care team to find out what types of physical activity are best for you. Set a plan to get about 30 minutes of exercise on most days.  Foods Recommended Food Group Foods Recommended  Grains Whole grain breads and cereals, including whole wheat, barley, rye, buckwheat, corn, teff, quinoa, millet, amaranth, brown or wild rice, sorghum, and oats Pasta, especially whole wheat or other whole grain types  [employer] [place] [employer], quinoa or wild rice Whole grain crackers, bread, rolls, pitas Home-made bread with reduced-sodium baking soda  Protein Foods Lean cuts of beef and pork (loin, leg, round, extra lean hamburger)  Skinless Press photographer  and other wild game Dried beans and peas Nuts and nut butters Meat alternatives made with soy or textured vegetable protein  Egg whites or egg substitute Cold cuts made with lean meat or soy protein  Dairy Nonfat (skim), low-fat, or 1%-fat milk  Nonfat or low-fat yogurt or cottage cheese Fat-free and low-fat cheese  Vegetables Fresh, frozen, or canned vegetables without added fat or salt   Fruits Fresh, frozen, canned, or dried fruit   Oils Unsaturated oils (corn, olive, peanut, soy, sunflower, canola)  Soft or liquid margarines and vegetable oil spreads  Salad dressings Seeds and nuts  Avocado   Foods Not Recommended Food Group Foods Not Recommended  Grains Breads or crackers topped with salt Cereals (hot or cold) with more than 300 mg sodium per serving Biscuits, cornbread, and other "quick" breads prepared with baking soda Bread crumbs or stuffing mix from a store High-fat bakery products, such as doughnuts, biscuits, croissants, danish pastries, pies, cookies Instant cooking foods to which you add hot water and stir--potatoes, noodles, rice, etc. Packaged starchy foods--seasoned noodle or rice dishes, stuffing mix, macaroni and cheese dinner Snacks made with partially hydrogenated oils, including chips, cheese puffs, snack mixes, regular crackers, butter-flavored popcorn  Protein Foods  Higher-fat cuts of meats (ribs, t-bone steak, regular hamburger) Bacon, sausage, or hot dogs Cold cuts, such as salami or bologna, deli meats, cured meats, corned beef Organ meats (liver, brains, gizzards, sweetbreads) Poultry with skin Fried or smoked meat, poultry, and fish Whole eggs and egg yolks (more than 2-4 per week) Salted legumes, nuts, seeds, or nut/seed butters Meat alternatives with high levels of sodium (>300 mg per serving) or saturated fat (>5 g per serving)  Dairy Whole milk,?2% fat milk, buttermilk Whole milk yogurt or ice cream Cream Half-&-half Cream cheese Sour  cream Cheese  Vegetables Canned or frozen vegetables with salt, fresh vegetables prepared with salt, butter, cheese, or cream sauce Fried vegetables Pickled vegetables such as olives, pickles, or sauerkraut  Fruits Fried fruits Fruits served with butter or cream  Oils Butter, stick margarine, shortening Partially hydrogenated oils or trans fats Tropical oils (coconut, palm, palm kernel oils)  Other Candy, sugar sweetened soft drinks and desserts Salt, sea salt, garlic salt, and seasoning mixes containing salt Bouillon cubes Ketchup, barbecue sauce, Worcestershire sauce, soy sauce, teriyaki sauce Miso Salsa Pickles, olives, relish   Heart Healthy Consistent Carbohydrate Vegetarian (Lacto-Ovo) Sample 1-Day Menu  Breakfast 1 cup oatmeal, cooked (2 carbohydrate servings)   cup blueberries (1 carbohydrate serving)  11 almonds, without salt  1 cup 1% milk (1 carbohydrate serving)  1 cup coffee  Morning Snack 1 cup fat-free plain yogurt (1 carbohydrate serving)  Lunch 1 whole wheat bun (1 carbohydrate servings)  1 black bean burger (1 carbohydrate servings)  1 slice cheddar cheese, low sodium  2 slices tomatoes  2 leaves lettuce  1 teaspoon mustard  1 small pear (1 carbohydrate servings)  1 cup green tea, unsweetened  Afternoon Snack 1/3 cup trail mix with nuts, seeds, and raisins, without salt (1 carbohydrate servinga)  Evening Meal  cup meatless chicken  2/3 cup brown rice, cooked (2 carbohydrate servings)  1 cup broccoli, cooked (2/3 carbohydrate serving)   cup carrots, cooked (1/3 carbohydrate serving)  2 teaspoons olive oil  1 teaspoon balsamic vinegar  1 whole wheat dinner roll (1 carbohydrate serving)  1 teaspoon margarine, soft, tub  1 cup 1% milk (1 carbohydrate serving)  Evening Snack 1 extra small banana (1 carbohydrate serving)  1 tablespoon peanut butter   Heart Healthy Consistent Carbohydrate Vegan Sample 1-Day Menu  Breakfast 1 cup oatmeal, cooked (2  carbohydrate servings)   cup blueberries (1 carbohydrate serving)  11 almonds, without salt  1 cup soymilk fortified with calcium, vitamin B12, and vitamin D  1 cup coffee  Morning Snack 6 ounces soy yogurt (1 carbohydrate servings)  Lunch 1 whole wheat bun(1 carbohydrate servings)  1 black bean burger (1 carbohydrate serving)  2 slices tomatoes  2 leaves lettuce  1 teaspoon mustard  1 small pear (1 carbohydrate servings)  1 cup green tea, unsweetened  Afternoon Snack 1/3 cup trail mix with nuts, seeds, and raisins, without salt (1 carbohydrate servings)  Evening Meal  cup meatless chicken  2/3 cup brown rice, cooked (2 carbohydrate servings)  1 cup broccoli, cooked (2/3 carbohydrate serving)   cup carrots, cooked (1/3 carbohydrate serving)  2 teaspoons olive oil  1 teaspoon balsamic vinegar  1 whole wheat dinner roll (1 carbohydrate serving)  1 teaspoon margarine, soft, tub  1 cup soymilk fortified with calcium, vitamin B12, and vitamin D  Evening Snack 1 extra small banana (1 carbohydrate serving)  1 tablespoon peanut butter    Heart Healthy Consistent Carbohydrate Sample  1-Day Menu  Breakfast 1 cup cooked oatmeal (2 carbohydrate servings)  3/4 cup blueberries (1 carbohydrate serving)  1 ounce almonds  1 cup skim milk (1 carbohydrate serving)  1 cup coffee  Morning Snack 1 cup sugar-free nonfat yogurt (1 carbohydrate serving)  Lunch 2 slices whole-wheat bread (2 carbohydrate servings)  2 ounces lean [place] breast  1 ounce low-fat Swiss cheese  1 teaspoon mustard  1 slice tomato  1 lettuce leaf  1 small pear (1 carbohydrate serving)  1 cup skim milk (1 carbohydrate serving)  Afternoon Snack 1 ounce trail mix with unsalted nuts, seeds, and raisins (1 carbohydrate serving)  Evening Meal 3 ounces salmon  2/3 cup cooked brown rice (2 carbohydrate servings)  1 teaspoon soft margarine  1 cup cooked broccoli with 1/2 cup cooked carrots (1 carbohydrate serving  Carrots,  cooked, boiled, drained, without salt  1 cup lettuce  1 teaspoon olive oil with vinegar for dressing  1 small whole grain roll (1 carbohydrate serving)  1 teaspoon soft margarine  1 cup unsweetened tea  Evening Snack 1 extra-small banana (1 carbohydrate serving)  Copyright 2020  [employer]. All rights reserved.

## 2024-10-14 NOTE — Evaluation (Signed)
 Physical Therapy Evaluation Patient Details Name: Peter Scott MRN: [id number] DOB: 04-12-1932 Today's Date: 10/14/2024  History of Present Illness  Pt is a 89 y.o male admitted 11/24 for SOB and DOE. Admitted for acute on chronic hypoxic respiratory failure in setting of CHF exacerbation.  PMH: HTN, CHF, pulmonary HTN, TAVR, afib, DM, CABG  Clinical Impression  Pt in bed upon arrival of PT, agreeable to evaluation at this time. Prior to admission the pt was ambulating without DME, using only 1L O2 as needed, and reports no recent falls. 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 The pt was able to complete sit-stand transfers and gait without assist, but does need increased O2 (up to 4L) in session to maintain SpO2 >90% with exertion. Pt with mild balance deficits in addition to limited endurance, will benefit from continued skilled PT to progress functional strength, power, endurance, and stability towards his personal baseline.   SpO2 on 2L at rest at start of session: 95% SpO2 on 2L with gait: 88% with increased work of breathing SpO2 on 4L with gait: 91% SpO2 on 3L at rest after gait: 91%    If plan is discharge home, recommend the following: A little help with walking and/or transfers;A little help with bathing/dressing/bathroom;Assistance with cooking/housework;Assist for transportation;Help with stairs or ramp for entrance   Can travel by private vehicle        Equipment Recommendations None recommended by PT  Recommendations for Other Services       Functional Status Assessment Patient has had a recent decline in their functional status and demonstrates the ability to make significant improvements in function in a reasonable and predictable amount of time.     Precautions / Restrictions Precautions Precautions: Fall Recall of Precautions/Restrictions:  Intact Precaution/Restrictions Comments: watch SpO2 3-4L this session Restrictions Weight Bearing Restrictions Per Provider Order: No      Mobility  Bed Mobility Overal bed mobility: Independent             General bed mobility comments: Received sitting EOB    Transfers Overall transfer level: Independent Equipment used: None               General transfer comment: no overt LOB or assist to steady    Ambulation/Gait Ambulation/Gait assistance: Contact guard assist Gait Distance (Feet): 300 Feet Assistive device: None Gait Pattern/deviations: Step-through pattern, Decreased stride length Gait velocity: decreased Gait velocity interpretation: <1.31 ft/sec, indicative of household ambulator   General Gait Details: pt with UE support on hallway rail with standing for coughing, otherwise stable without UE support, CGA with balance challenge . SpO2 to 88% with increased work of breathing on 2L, increased to 4L with SpO2 >90%     Balance Overall balance assessment: Mild deficits observed, not formally tested                               Standardized Balance Assessment Standardized Balance Assessment : Dynamic Gait Index   Dynamic Gait Index Level Surface: Normal Change in Gait Speed: Mild Impairment Gait with Horizontal Head Turns: Mild Impairment Gait with Vertical Head Turns: Mild Impairment Gait and Pivot Turn: Normal Step Over Obstacle: Mild Impairment Step Around Obstacles: Normal Steps: Mild Impairment Total Score: 19       Pertinent Vitals/Pain Pain Assessment Pain Assessment: No/denies pain  Home Living Family/patient expects to be discharged to:: Private residence Living Arrangements: Spouse/significant other Available Help at Discharge: Family;Available 24 hours/day Type of Home: House Home Access: Stairs to enter Entrance Stairs-Rails: Can reach both Entrance Stairs-Number of Steps: 3   Home Layout: One level Home  Equipment: Grab bars - tub/shower;Grab bars - toilet Additional Comments: 1L of O2 prn    Prior Function Prior Level of Function : Independent/Modified Independent             Mobility Comments: no DME, denies falls ADLs Comments: Ind, 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, 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     Extremity/Trunk Assessment   Upper Extremity Assessment Upper Extremity Assessment: Defer to OT evaluation;Overall WFL for tasks assessed    Lower Extremity Assessment Lower Extremity Assessment: Overall WFL for tasks assessed    Cervical / Trunk Assessment Cervical / Trunk Assessment: Kyphotic  Communication   Communication Communication: No apparent difficulties    Cognition Arousal: Alert Behavior During Therapy: WFL for tasks assessed/performed   PT - Cognitive impairments: No apparent impairments                       PT - Cognition Comments: able to answer questions well, tnagential at times but appropriate in session Following commands: Intact       Cueing Cueing Techniques: Verbal cues     General Comments General comments (skin integrity, edema, etc.): SpO2 to 88% on 2L with increased SOB and coughing during gait, >90% on 4L        Assessment/Plan    PT Assessment Patient needs continued PT services  PT Problem List Cardiopulmonary status limiting activity;Decreased activity tolerance;Decreased mobility;Decreased balance       PT Treatment Interventions DME instruction;Gait training;Stair training;Functional mobility training;Therapeutic activities;Therapeutic exercise;Balance training;Patient/family education    PT Goals (Current goals can be found in the Care Plan section)  Acute Rehab PT Goals Patient Stated Goal: to be able to walk on his son's farm PT Goal Formulation: With patient Time For Goal Achievement: 10/14/24 Potential to Achieve Goals: Good    Frequency Min 1X/week        AM-PAC PT 6 Clicks Mobility  Outcome Measure Help needed  turning from your back to your side while in a flat bed without using bedrails?: None Help needed moving from lying on your back to sitting on the side of a flat bed without using bedrails?: None Help needed moving to and from a bed to a chair (including a wheelchair)?: None Help needed standing up from a chair using your arms (e.g., wheelchair or bedside chair)?: A Little Help needed to walk in hospital room?: A Little Help needed climbing 3-5 steps with a railing? : A Little 6 Click Score: 21    End of Session Equipment Utilized During Treatment: Gait belt;Oxygen  Activity Tolerance: Patient tolerated treatment well Patient left: in chair;with call bell/phone within reach;with chair alarm set Nurse Communication: Mobility status PT Visit Diagnosis: Unsteadiness on feet (R26.81)    Time: 9158-9085 PT Time Calculation (min) (ACUTE ONLY): 33 min   Charges:   PT Evaluation $PT Eval Low Complexity: 1 Low PT Treatments $Gait Training: 8-22 mins PT General Charges $$ ACUTE PT VISIT: 1 Visit         Izetta Call, PT, DPT   Acute Rehabilitation Department Office [phone] Secure Chat Communication Preferred  Izetta JULIANNA Call 10/14/2024, 10:23 AM

## 2024-10-14 NOTE — Assessment & Plan Note (Addendum)
 AKI, Hypokalemia   Today renal function with serum cr at 1,9 with K at 3,9 and serum bicarbonate at 28  Na 136 and Mg 2,2   Continue diuresis with furosemide   Plan to follow up renal function and electrolytes in am.

## 2024-10-14 NOTE — Progress Notes (Signed)
 Initial Nutrition Assessment  DOCUMENTATION CODES:   Not applicable  INTERVENTION:   -Continue heart healthy, carb modified diet -MVI with minerals daily -RD provided Heart Healthy, Consistent Carbohydrate Nutrition Therapy handout from [facility] Nutrition Care Manual; attached to AVS/ discharge summary  -RD provided referral to [facility] for further support and reinforcement   NUTRITION DIAGNOSIS:   Increased nutrient needs related to chronic illness as evidenced by estimated needs.  GOAL:   Patient will meet greater than or equal to 90% of their needs  MONITOR:   PO intake, Supplement acceptance  REASON FOR ASSESSMENT:   Consult Assessment of nutrition requirement/status, Diet education  ASSESSMENT:   89 y/o M w/ PMH of CHF, chronic hypoxic respiratory failure on 1 L [place] at home, HTN, HLD, a. fib on eliquis , CKDIIIb who presented w with shortness of breath x 1 day prior to admission.  Patient admitted with CHF.   Reviewed I/O's: -2.5 L x 24 hours and -5.1 L since admission  UOP: 3.1 L x 24 hours  Pt unavailable at time of visit. Attempted to speak with pt via call to hospital room phone x 3, however, unable to reach. RD unable to obtain further nutrition-related history or complete nutrition-focused physical exam at this time.    Per H&P, patient has had shortness of breath at rest and on exertion. He has been drinking a lot of fluids lately; he does not monitor his sodium or fluid intake.   Patient is currently on a heart healthy/ carb modified diet. Noted meal completions 55-100%.   Reviewed weight history; weight has been stable over the past 3 months. Noted weight has ranged from 64 kg-68.4 kg during this time period. Per nursing assessment, patient with deep pitting edema to bilateral extremities. Given patient with edema and history of CHF, weight changes difficult to interpret for true weight loss and could be  potentially masking fat and muscle depletions on exam.   Per TOC notes, plan to return home at discharge; he is connected with [facility] for healthcare and has family support.   Medications reviewed and include lasix  and potassium chloride .   Lab Results  Component Value Date   HGBA1C 8.2 (H) 10/12/2024   PTA DM medications are 12-14 units insulin  glargine daily. Per ADA's Standards of Medical Care of Diabetes, glycemic targets for older adults who have multiple co-morbidities, cognitive impairments, and functional dependence should be less stringent (Hgb A1c <8.0-8.5). DM coordinator agrees with current orders and in agreement that HGb A1c is acceptable for age range in order to avoid hypoglycemia.   Labs reviewed: CBGS: 118-256 (inpatient orders for glycemic control are 0-6 units insulin  aspart TID with meals and 5 units insulin  glargine-yfgn daily).    Diet Order:   Diet Order             Diet heart healthy/carb modified Room service appropriate? Yes; Fluid consistency: Thin  Diet effective now                   EDUCATION NEEDS:   Education needs have been addressed  Skin:  Skin Assessment: Reviewed RN Assessment  Last BM:  10/14/24  Height:   Ht Readings from Last 1 Encounters:  10/12/24 5' 8 (1.727 m)    Weight:   Wt Readings from Last 1 Encounters:  10/12/24 66.9 kg    Ideal Body Weight:  70 kg  BMI:  Body mass index is 22.43 kg/m.  Estimated Nutritional  Needs:   Kcal:  1650-1850  Protein:  85-100 grams  Fluid:  1.6-1.8 L    Margery ORN, RD, LDN, CDCES Registered Dietitian III Certified Diabetes Care and Education Specialist If unable to reach this RD, please use RD Inpatient group chat on secure chat between hours of 8am-4 pm daily

## 2024-10-14 NOTE — Assessment & Plan Note (Addendum)
Continue rate control with amiodarone and anticoagulation with apixaban.  Continue telemetry monitoring.  

## 2024-10-14 NOTE — Assessment & Plan Note (Addendum)
 No chest pain, continue with atorvastatin  Not on antiplatelet therapy due to full anticoagulation with apixaban   High sensitive troponin elevation due to heart failure, no acute coronary syndrome.

## 2024-10-14 NOTE — Progress Notes (Addendum)
 Progress Note   Patient: Peter Scott FMW:[id number] DOB: 05/17/1932 DOA: 10/12/2024     2 DOS: the patient was seen and examined on 10/14/2024   Brief hospital course: Peter Scott was admitted to the hospital with the working diagnosis of heart failure exacerbation.   89 year old with a history of chronic combined systolic and diastolic CHF, DM2, CAD status post CABG times 4 in 2001, history of TAVR 2019, pulmonary hypertension with chronic hypoxic respiratory failure on 1 L nasal cannula as outpatient, HTN, HLD, atrial fibrillation on Eliquis , and CKD stage IIIb who presented to [facility] 11/24 with rapidly progressive shortness of breath.  He admitted to forcibly increasing his fluid intake lately due to a dry mouth.  He denied fevers or chills.  CXR was suggestive of pulmonary edema.  Exam revealed pitting edema peripherally.   Chest radiograph with hypoinflation, positive cardiomegaly, bilateral hilar vascular congestion, bilateral pleural effusions. Sternotomy wires in place.   Assessment and Plan: * Acute on chronic diastolic CHF (congestive heart failure) (HCC) Echocardiogram with mild reduction in systolic function with EF 45 to 50%, grade II diastolic dysfunction with pseudo normalization, RV systolic function stable, LA with moderate dilatation, mild to moderate mitral valve regurgitation, mild to moderate tricuspid valve regurgitation, mild aortic stenosis ( sp TAVR)   Urine output is 3,050 ml Systolic blood pressure 120 to 140 mmHg.   Patient had furosemide  80 mg IV this morning.  If blood pressure allows and renal function stable will add low dose ace inh.  Limited medical therapy due to reduced GFR.   Coronary artery disease No chest pain, continue with atorvastatin  Not on antiplatelet therapy due to full anticoagulation with apixaban   Paroxysmal atrial fibrillation (HCC) Continue rate control with amiodarone  and anticoagulation with apixaban .  Continue  telemetry monitoring  Essential hypertension, benign Continue blood pressure monitor, he will benefit from RAAS inhibition   CKD stage 3b, GFR 30-44 ml/min (HCC) - baseline Scr 1.8-2.0 Renal function with serum cr at 2,0, with K at 4.0 and serum bicarbonate at 27  Na 139 and Mg 2.1   Plan to follow up renal function and electrolytes in am.   Type 2 diabetes mellitus with hyperlipidemia (HCC) Uncontrolled with hyperglycemia with hgb A1c of 8,2  Plan to continue insulin  sliding scale for glucose cover and monitoring Continue basal insulin .  Patient is tolerating po well.  Fasting glucose today 145 mg/dl   Continue statin         Subjective: Patient is feeling better, dyspnea has improved, he is using flutter valve and incentive spirometer. Ambulating with assistance.  Physical Exam: Vitals:   10/14/24 0036 10/14/24 0336 10/14/24 0813 10/14/24 1153  BP: (!) 125/39 (!) 121/58 (!) 140/54 (!) 140/48  Pulse: 84 73 77 (!) 54  Resp: 20 20 20  (!) 23  Temp: 98.2 F (36.8 C) 98.2 F (36.8 C) 98.2 F (36.8 C)   TempSrc: Oral Oral Oral Oral  SpO2: 98% 94% 92% 99%  Weight:      Height:       Neurology awake and alert,  ENT with mild pallor Cardiovascular with S1 and S2 present and regular with no gallops, or rubs, positive systolic murmur at the left lower sternal border.  Respiratory with mild rales bilaterally with no rhonchi or wheezing Abdomen with no distention Positive lower extremity edema, pitting +   Data Reviewed:    Family Communication: no family at the bedside   Disposition: Status is: Inpatient Remains inpatient appropriate  because: recovering heart failure   Planned Discharge Destination: Home     Author: Elidia Toribio Furnace, MD 10/14/2024 3:16 PM  For on call review [email or web address].

## 2024-10-14 NOTE — Assessment & Plan Note (Addendum)
 Echocardiogram with mild reduction in systolic function with EF 45 to 50%, grade II diastolic dysfunction with pseudo normalization, RV systolic function preserved, LA with moderate dilatation, mild to moderate mitral valve regurgitation, mild to moderate tricuspid valve regurgitation, mild aortic stenosis ( sp TAVR)   Urine output is 2,400 ml Systolic blood pressure 130 mmHg.   Acute on chronic hypoxemic respiratory failure due to cardiogenic edema.  11/27 chest radiograph with worsening bilateral central interstitial infiltrates with bilateral pleural effusions, more right than left. Round lesion left hilar region, will repeat imaging after aggressive diuresis.   Today with improved 02 saturation up to 100% on 5 L/min per [place]

## 2024-10-14 NOTE — Evaluation (Signed)
 Occupational Therapy Evaluation & Discharge Patient Details Name: Peter Scott MRN: [id number] DOB: 1932/11/10 Today's Date: 10/14/2024   History of Present Illness   Pt is a 89 y.o male admitted 11/24 for SOB and DOE. Admitted for acute on chronic hypoxic respiratory failure in setting of CHF exacerbation.  PMH: HTN, CHF, pulmonary HTN, TAVR, afib, DM, CABG     Clinical Impressions Pt admitted based on above, and was seen based on problem list below. PTA pt was mod I with ADLs and IADLs. Today pt is at his baseline for ADLs. Pt completed standing ADLs, LB dressing, and functional transfers at mod I with increased time. Pt reporting he is at baseline, but does experience SOB with coughing spells. Verbally educated pt on energy conservation strategies, use of flutter valve, and incentive spirometer to improve respiratory functions. Pt is at his functional baseline, despite increased O2 requirements, which suspect pt will improve as medical management progresses. No follow up OT or DME needs, but encouraged frequent mobility during admission. Pt verbalized understanding of all education provided, OT is signing off on this pt.   If plan is discharge home, recommend the following:   Assist for transportation     Functional Status Assessment   Patient has not had a recent decline in their functional status     Equipment Recommendations   None recommended by OT      Precautions/Restrictions   Precautions Precautions: Fall Recall of Precautions/Restrictions: Intact Restrictions Weight Bearing Restrictions Per Provider Order: No     Mobility Bed Mobility     General bed mobility comments: Received sitting EOB    Transfers Overall transfer level: Independent Equipment used: None       General transfer comment: Ambulated 368ft no AD or LOB. Mild drift      Balance Overall balance assessment: Mild deficits observed, not formally tested       ADL either performed  or assessed with clinical judgement   ADL Overall ADL's : Modified independent;At baseline     General ADL Comments: Mod I of increased time, pt reports at baseline     Vision Baseline Vision/History: 4 Cataracts Patient Visual Report: No change from baseline Vision Assessment?: No apparent visual deficits            Pertinent Vitals/Pain Pain Assessment Pain Assessment: No/denies pain     Extremity/Trunk Assessment Upper Extremity Assessment Upper Extremity Assessment: Overall WFL for tasks assessed   Lower Extremity Assessment Lower Extremity Assessment: Overall WFL for tasks assessed   Cervical / Trunk Assessment Cervical / Trunk Assessment: Kyphotic   Communication Communication Communication: No apparent difficulties   Cognition Arousal: Alert Behavior During Therapy: WFL for tasks assessed/performed Cognition: No apparent impairments     Following commands: Intact       Cueing  General Comments   Cueing Techniques: Verbal cues  Pt with poor pleth and inconsistent O2 readings on 4L O2. Brief periods of 91 but inconsistent pleth throughout           Home Living Family/patient expects to be discharged to:: Private residence Living Arrangements: Spouse/significant other Available Help at Discharge: Family;Available 24 hours/day Type of Home: House Home Access: Stairs to enter [employer] of Steps: 3 Entrance Stairs-Rails: Can reach both Home Layout: One level     Bathroom Shower/Tub: Producer, Television/film/video: Handicapped height     Home Equipment: Grab bars - tub/shower;Grab bars - toilet   Additional Comments: 1L of O2 prn  Prior Functioning/Environment Prior Level of Function : Independent/Modified Independent             Mobility Comments: no DME, denies falls ADLs Comments: Ind, 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, 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    OT Problem List: Cardiopulmonary status limiting activity        OT  Goals(Current goals can be found in the care plan section)   Acute Rehab OT Goals Patient Stated Goal: To breath better OT Goal Formulation: All assessment and education complete, DC therapy Time For Goal Achievement: 10/28/24 Potential to Achieve Goals: Good   AM-PAC OT 6 Clicks Daily Activity     Outcome Measure Help from another person eating meals?: None Help from another person taking care of personal grooming?: None Help from another person toileting, which includes using toliet, bedpan, or urinal?: None Help from another person bathing (including washing, rinsing, drying)?: None Help from another person to put on and taking off regular upper body clothing?: None Help from another person to put on and taking off regular lower body clothing?: None 6 Click Score: 24   End of Session Equipment Utilized During Treatment: Oxygen  (4L) Nurse Communication: Mobility status  Activity Tolerance: Patient tolerated treatment well Patient left: in bed;with call bell/phone within reach  OT Visit Diagnosis: Other (comment) (Cardiopulmonary)                Time: 9196-9169 OT Time Calculation (min): 27 min Charges:  OT General Charges $OT Visit: 1 Visit OT Evaluation $OT Eval Low Complexity: 1 Low OT Treatments $Self Care/Home Management : 8-22 mins  Adrianne BROCKS, OT  Acute Rehabilitation Services Office [phone] Secure chat preferred   Adrianne GORMAN Savers 10/14/2024, 8:56 AM

## 2024-10-14 NOTE — Progress Notes (Signed)
 Heart Failure Navigator Progress Note  Assessed for Heart & Vascular TOC clinic readiness.  Patient does not meet criteria due to has a scheduled [facility] appointment on 12/23/2024. .   Navigator will sign off at this time.   Stephane Haddock, BSN, Scientist, Clinical (histocompatibility And Immunogenetics) Only

## 2024-10-14 NOTE — Plan of Care (Signed)
   Problem: Education: Goal: Knowledge of General Education information will improve Description Including pain rating scale, medication(s)/side effects and non-pharmacologic comfort measures Outcome: Progressing

## 2024-10-14 NOTE — Hospital Course (Addendum)
 Peter Scott was admitted to the hospital with the working diagnosis of heart failure exacerbation.   89 year old with a history of heart failure, coronary artery disease, sp post CABG times 4 in 2001, history of TAVR 2019, pulmonary hypertension with chronic hypoxic respiratory failure on 1 L nasal cannula as outpatient, HTN, HLD, atrial fibrillation and CKD stage IIIb who presented with progressive dyspnea. Patient was noted to have worsening dyspnea for a few weeks prior to admission, worse with exertion and associated with peripheral edema. Because of severe symptoms he presented to the ED.  On his initial physical examination his 02 saturation was 84% and had increased work of breathing.  Blood pressure 151/77, HR 68, RR 15 and 02 saturation 93% on supplemental 02 per Peter Scott  Lungs with decreased breath sounds and bilateral rales, noted increased work of breathing, heart with S1 ands S2 present and regular with no gallops or rubs, positive systolic murmur, abdomen with no distention and positive lower extremity edema, ++ pitting.   Na 138, K 3,7 Cl 100 bicarbonate 23, glucose 214 cr 2.0  BNP 7,326  High sensitive troponin 74 and 63  Wbc 14.0 hgb 10.2 plt 236  INR 1,6   Chest radiograph with hypoinflation, positive cardiomegaly, bilateral hilar vascular congestion, bilateral pleural effusions. Sternotomy wires in place.  EKG 73 bpm, normal axis, normal intervals, manually corrected qtc 430, atrial flutter rhythm 3:1, with no significant ST segment or T wave changes.    Patient was placed on furosemide  for diuresis.   11/27 follow up chest radiograph with persistent pulmonary edema.  11/28 continue volume overloaded.  11/29 improving volume status.  11/30 continue to improve volume status, plan to transition to oral diuretic therapy in the next 24 hrs.  12/01 patient clinically stable for discharge, will follow up as outpatient.

## 2024-10-14 NOTE — Assessment & Plan Note (Addendum)
 Patient will be on hold on losartan  for now due to risk of hypotension and acutely reduced GFR. Follow up blood pressure as outpatient,

## 2024-10-14 NOTE — Progress Notes (Signed)
 Mobility Specialist Progress Note:    10/14/24 0951  Mobility  Activity Ambulated with assistance  Level of Assistance Standby assist, set-up cues, supervision of patient - no hands on  Assistive Device None  Distance Ambulated (ft) 100 ft  Range of Motion/Exercises Active  Activity Response Tolerated well  Mobility Referral Yes  Mobility visit 1 Mobility  Mobility Specialist Start Time (ACUTE ONLY) 0951  Mobility Specialist Stop Time (ACUTE ONLY) 1007  Mobility Specialist Time Calculation (min) (ACUTE ONLY) 16 min   Received pt sitting in recliner agreeable to quick session. Pt c/o about productive cough that interferes w/ breathing but otherwise is doing fine. Pt moving and ambulating well. Returned pt to recliner w/ all needs met.   Venetia Keel Mobility Specialist Please Neurosurgeon or Rehab Office at [phone]

## 2024-10-14 NOTE — Assessment & Plan Note (Addendum)
 Uncontrolled with hyperglycemia with hgb A1c of 8,2  Plan to continue insulin  sliding scale for glucose cover and monitoring Continue basal insulin .  Patient is tolerating po well.  Fasting glucose today 230 mg/dl   Continue statin

## 2024-10-14 NOTE — Inpatient Diabetes Management (Addendum)
 Inpatient Diabetes Program Recommendations  AACE/ADA: New Consensus Statement on Inpatient Glycemic Control (2015)  Target Ranges:  Prepandial:   less than 140 mg/dL      Peak postprandial:   less than 180 mg/dL (1-2 hours)      Critically ill patients:  140 - 180 mg/dL   Lab Results  Component Value Date   GLUCAP 164 (H) 10/14/2024   HGBA1C 8.2 (H) 10/12/2024    Review of Glycemic Control  Latest Reference Range & Units 10/13/24 21:46 10/14/24 05:59 10/14/24 11:53  Glucose-Capillary 70 - 99 mg/dL 795 (H) 881 (H) 835 (H)   Diabetes history: DM 2 Outpatient Diabetes medications:  Semglee  12-14 units q HS Current orders for Inpatient glycemic control:  Novolog  0-6 units tid with meals Semglee  5 units daily  Inpatient Diabetes Program Recommendations:    Agree with current orders.  For advanced age, A1C is okay.  Do not recommend any changes (Needs to avoid hypoglycemia).   Addendum- Spoke at length to patient regarding DM management.  He states that he wears a [employer] that helps him with monitoring.  He is frustrated that when he eats because his blood sugar goes up close to 200 mg/dL.  He also sometimes wakes up with low blood sugars. Encouraged him to eat a snack when he goes to bed.  If he has frequent lows, may need adjustment of insulin .  I told patient that it is okay for his blood sugar to go up to 200 as long as it comes back down (which he says it does).  For his age, it is okay for him to have occasional CBG's in the 200's as long as he is not symptomatic.  He will f/u with his PCP at the [facility] in December.    Thanks,  Randall Bullocks, RN, BC-ADM Inpatient Diabetes Coordinator Pager [phone]  (8a-5p)

## 2024-10-15 ENCOUNTER — Inpatient Hospital Stay (HOSPITAL_COMMUNITY)

## 2024-10-15 DIAGNOSIS — J949 Pleural condition, unspecified: Secondary | ICD-10-CM | POA: Diagnosis not present

## 2024-10-15 DIAGNOSIS — I2510 Atherosclerotic heart disease of native coronary artery without angina pectoris: Secondary | ICD-10-CM | POA: Diagnosis not present

## 2024-10-15 DIAGNOSIS — E1169 Type 2 diabetes mellitus with other specified complication: Secondary | ICD-10-CM

## 2024-10-15 DIAGNOSIS — I10 Essential (primary) hypertension: Secondary | ICD-10-CM | POA: Diagnosis not present

## 2024-10-15 DIAGNOSIS — J9811 Atelectasis: Secondary | ICD-10-CM | POA: Diagnosis not present

## 2024-10-15 DIAGNOSIS — J90 Pleural effusion, not elsewhere classified: Secondary | ICD-10-CM | POA: Diagnosis not present

## 2024-10-15 DIAGNOSIS — E785 Hyperlipidemia, unspecified: Secondary | ICD-10-CM

## 2024-10-15 DIAGNOSIS — I5033 Acute on chronic diastolic (congestive) heart failure: Secondary | ICD-10-CM | POA: Diagnosis not present

## 2024-10-15 DIAGNOSIS — I480 Paroxysmal atrial fibrillation: Secondary | ICD-10-CM | POA: Diagnosis not present

## 2024-10-15 DIAGNOSIS — R918 Other nonspecific abnormal finding of lung field: Secondary | ICD-10-CM | POA: Diagnosis not present

## 2024-10-15 LAB — GLUCOSE, CAPILLARY
Glucose-Capillary: 160 mg/dL — ABNORMAL HIGH (ref 70–99)
Glucose-Capillary: 186 mg/dL — ABNORMAL HIGH (ref 70–99)
Glucose-Capillary: 233 mg/dL — ABNORMAL HIGH (ref 70–99)
Glucose-Capillary: 200 mg/dL — ABNORMAL HIGH (ref 70–99)

## 2024-10-15 LAB — CBC WITH DIFFERENTIAL/PLATELET
Abs Immature Granulocytes: 0.2 K/uL — ABNORMAL HIGH (ref 0.00–0.07)
Basophils Absolute: 0 K/uL (ref 0.0–0.1)
Basophils Relative: 0 %
Eosinophils Absolute: 0.1 K/uL (ref 0.0–0.5)
Eosinophils Relative: 1 %
HCT: 27.2 % — ABNORMAL LOW (ref 39.0–52.0)
Hemoglobin: 8.9 g/dL — ABNORMAL LOW (ref 13.0–17.0)
Immature Granulocytes: 2 %
Lymphocytes Relative: 4 %
Lymphs Abs: 0.5 K/uL — ABNORMAL LOW (ref 0.7–4.0)
MCH: 28.5 pg (ref 26.0–34.0)
MCHC: 32.7 g/dL (ref 30.0–36.0)
MCV: 87.2 fL (ref 80.0–100.0)
Monocytes Absolute: 1.4 K/uL — ABNORMAL HIGH (ref 0.1–1.0)
Monocytes Relative: 12 %
Neutro Abs: 9.3 K/uL — ABNORMAL HIGH (ref 1.7–7.7)
Neutrophils Relative %: 81 %
Platelets: 221 K/uL (ref 150–400)
RBC: 3.12 MIL/uL — ABNORMAL LOW (ref 4.22–5.81)
RDW: 15.1 % (ref 11.5–15.5)
WBC: 11.5 K/uL — ABNORMAL HIGH (ref 4.0–10.5)
nRBC: 0 % (ref 0.0–0.2)

## 2024-10-15 LAB — BASIC METABOLIC PANEL WITH GFR
Anion gap: 13 (ref 5–15)
BUN: 54 mg/dL — ABNORMAL HIGH (ref 8–23)
CO2: 26 mmol/L (ref 22–32)
Calcium: 8.3 mg/dL — ABNORMAL LOW (ref 8.9–10.3)
Chloride: 98 mmol/L (ref 98–111)
Creatinine, Ser: 2.12 mg/dL — ABNORMAL HIGH (ref 0.61–1.24)
GFR, Estimated: 29 mL/min — ABNORMAL LOW (ref >60)
Glucose, Bld: 164 mg/dL — ABNORMAL HIGH (ref 70–99)
Potassium: 4.1 mmol/L (ref 3.5–5.1)
Sodium: 137 mmol/L (ref 135–145)

## 2024-10-15 LAB — MAGNESIUM: Magnesium: 2.1 mg/dL (ref 1.7–2.4)

## 2024-10-15 LAB — TYPE AND SCREEN
ABO/RH(D): O POS
Antibody Screen: NEGATIVE

## 2024-10-15 MED ORDER — GUAIFENESIN ER 600 MG PO TB12
600.0000 mg | ORAL_TABLET | Freq: Two times a day (BID) | ORAL | Status: DC
  Administered 2024-10-15 – 2024-10-19 (×9): 600 mg via ORAL
  Filled 2024-10-15 (×9): qty 1

## 2024-10-15 MED ORDER — FUROSEMIDE 10 MG/ML IJ SOLN
80.0000 mg | Freq: Two times a day (BID) | INTRAMUSCULAR | Status: DC
  Administered 2024-10-15 – 2024-10-18 (×8): 80 mg via INTRAVENOUS
  Filled 2024-10-15 (×8): qty 8

## 2024-10-15 NOTE — Progress Notes (Signed)
 Progress Note   Patient: Peter Scott FMW:[id number] DOB: Nov 30, 1931 DOA: 10/12/2024     3 DOS: the patient was seen and examined on 10/15/2024   Brief hospital course: Peter Scott was admitted to the hospital with the working diagnosis of heart failure exacerbation.   89 year old with a history of chronic combined systolic and diastolic CHF, DM2, CAD status post CABG times 4 in 2001, history of TAVR 2019, pulmonary hypertension with chronic hypoxic respiratory failure on 1 L nasal cannula as outpatient, HTN, HLD, atrial fibrillation on Eliquis , and CKD stage IIIb who presented to [facility] 11/24 with rapidly progressive shortness of breath.  He admitted to forcibly increasing his fluid intake lately due to a dry mouth.  He denied fevers or chills.  CXR was suggestive of pulmonary edema.  Exam revealed pitting edema peripherally.   Chest radiograph with hypoinflation, positive cardiomegaly, bilateral hilar vascular congestion, bilateral pleural effusions. Sternotomy wires in place.   Assessment and Plan: * Acute on chronic diastolic CHF (congestive heart failure) (HCC) Echocardiogram with mild reduction in systolic function with EF 45 to 50%, grade II diastolic dysfunction with pseudo normalization, RV systolic function preserved, LA with moderate dilatation, mild to moderate mitral valve regurgitation, mild to moderate tricuspid valve regurgitation, mild aortic stenosis ( sp TAVR)   Urine output is 1,100 ml Systolic blood pressure 130 mmHg.   Acute on chronic hypoxemic respiratory failure due to cardiogenic edema.  11/27 chest radiograph with worsening bilateral central interstitial infiltrates with bilateral pleural effusions, more right than left. Round lesion left hilar region, will repeat imaging after aggressive diuresis.   Coronary artery disease No chest pain, continue with atorvastatin  Not on antiplatelet therapy due to full anticoagulation with apixaban   Paroxysmal  atrial fibrillation (HCC) Continue rate control with amiodarone  and anticoagulation with apixaban .  Continue telemetry monitoring  Essential hypertension, benign Continue blood pressure monitor, he will benefit from RAAS inhibition   CKD stage 3b, GFR 30-44 ml/min (HCC) - baseline Scr 1.8-2.0 AKI, Hypokalemia   Renal function today with serum cr at 2,12 with K at 4,1 and serum bicarbonate at 25  Na 137 Mg 2,1   Continue diuresis with furosemide   Plan to follow up renal function and electrolytes in am.   Type 2 diabetes mellitus with hyperlipidemia (HCC) Uncontrolled with hyperglycemia with hgb A1c of 8,2  Plan to continue insulin  sliding scale for glucose cover and monitoring Continue basal insulin .  Patient is tolerating po well.  Fasting glucose today 164 mg/dl   Continue statin         Subjective: Patient with worsening dyspnea, cough with hemoptysis, no chest pain, feeling very weak and deconditioned.   Physical Exam: Vitals:   10/14/24 1952 10/15/24 0102 10/15/24 0523 10/15/24 1130  BP: (!) 119/59 (!) 121/50 129/63 135/71  Pulse: 83 64 69 63  Resp: 20 (!) 26 18 (!) 26  Temp: 98.6 F (37 C) 98 F (36.7 C) 97.9 F (36.6 C) 98 F (36.7 C)  TempSrc: Oral Oral Oral Oral  SpO2: 96% 92% 92% 92%  Weight:   65.5 kg   Height:       Neurology awake and alert, deconditioned ENT with mild pallor Cardiovascular with S1 and S2 present and regular with no gallops, or rubs, positive systolic murmur at the left lower sternal border No JVD Respiratory with bilateral rales and scattered rhonchi, decreased breath sounds at bases Abdomen soft and non tender, not distended Positive lower extremity edema, pitting ++  Data Reviewed:    Family Communication: no family at the bedside   Disposition: Status is: Inpatient Remains inpatient appropriate because: IV diuresis   Planned Discharge Destination: Home     Author: Elidia Toribio Furnace, MD 10/15/2024 12:13  PM  For on call review [email or web address].

## 2024-10-15 NOTE — Plan of Care (Signed)
   Problem: Health Behavior/Discharge Planning: Goal: Ability to manage health-related needs will improve Outcome: Progressing

## 2024-10-15 NOTE — Progress Notes (Signed)
 Pt. has a cough and has blood tinged sputum. Streaks of blood noted in his sputum upon assessment. On call for [facility] paged to make aware. See new orders.

## 2024-10-15 NOTE — Plan of Care (Signed)
  Problem: Education: Goal: Knowledge of General Education information will improve Description: Including pain rating scale, medication(s)/side effects and non-pharmacologic comfort measures Outcome: Progressing   Problem: Health Behavior/Discharge Planning: Goal: Ability to manage health-related needs will improve Outcome: Progressing   Problem: Clinical Measurements: Goal: Will remain free from infection Outcome: Progressing   Problem: Nutrition: Goal: Adequate nutrition will be maintained Outcome: Progressing   Problem: Coping: Goal: Level of anxiety will decrease Outcome: Progressing   Problem: Safety: Goal: Ability to remain free from injury will improve Outcome: Progressing   Problem: Coping: Goal: Ability to adjust to condition or change in health will improve Outcome: Progressing   Problem: Fluid Volume: Goal: Ability to maintain a balanced intake and output will improve Outcome: Progressing

## 2024-10-15 NOTE — Progress Notes (Signed)
 Patient with increased fluid volume overload as evidence by crackles in right lower lobe of lung, +4 BLE pitting edema, increased work of breathing, and reported shortness of breath. Notified on call physician; received order to reschedule 0100 lasix  for now.  Patient expresses desire to go home, expressing an understanding that his heart can't be fixed and that he does not wish to advance to more aggressive measures. Educated patient on palliative care services including goals of care and symptom relief. He is receptive and wishes to engage with palliative care discussions along with his wife, whom he states will participate via telephone citing difficulty with transportation and mobility.

## 2024-10-15 NOTE — Progress Notes (Signed)
 Mobility Specialist Progress Note:    10/15/24 1148  Mobility  Activity Moved bed into chair position (Ankle Pumps, Leg Lifts, Heel Slides x10 w/ 2 sec holds)  Level of Assistance Contact guard assist, steadying assist  Assistive Device None  Range of Motion/Exercises Active  Activity Response Tolerated fair  Mobility Referral Yes  Mobility visit 1 Mobility  Mobility Specialist Start Time (ACUTE ONLY) 1148  Mobility Specialist Stop Time (ACUTE ONLY) 1158  Mobility Specialist Time Calculation (min) (ACUTE ONLY) 10 min   Received pt in bed with RN in room agreeable to bed mobility. Afraid to walk d/t difficulty breathing. Pt able to perform movements well. C/o SOB and fatigue. Left pt in bed w/ all needs met.   Venetia Keel Mobility Specialist Please Neurosurgeon or Rehab Office at [phone]

## 2024-10-16 DIAGNOSIS — I5033 Acute on chronic diastolic (congestive) heart failure: Secondary | ICD-10-CM | POA: Diagnosis not present

## 2024-10-16 DIAGNOSIS — I480 Paroxysmal atrial fibrillation: Secondary | ICD-10-CM | POA: Diagnosis not present

## 2024-10-16 DIAGNOSIS — I10 Essential (primary) hypertension: Secondary | ICD-10-CM | POA: Diagnosis not present

## 2024-10-16 DIAGNOSIS — I2510 Atherosclerotic heart disease of native coronary artery without angina pectoris: Secondary | ICD-10-CM | POA: Diagnosis not present

## 2024-10-16 LAB — BASIC METABOLIC PANEL WITH GFR
Anion gap: 11 (ref 5–15)
BUN: 52 mg/dL — ABNORMAL HIGH (ref 8–23)
CO2: 28 mmol/L (ref 22–32)
Calcium: 8.4 mg/dL — ABNORMAL LOW (ref 8.9–10.3)
Chloride: 97 mmol/L — ABNORMAL LOW (ref 98–111)
Creatinine, Ser: 1.9 mg/dL — ABNORMAL HIGH (ref 0.61–1.24)
GFR, Estimated: 33 mL/min — ABNORMAL LOW (ref >60)
Glucose, Bld: 230 mg/dL — ABNORMAL HIGH (ref 70–99)
Potassium: 3.9 mmol/L (ref 3.5–5.1)
Sodium: 136 mmol/L (ref 135–145)

## 2024-10-16 LAB — GLUCOSE, CAPILLARY
Glucose-Capillary: 135 mg/dL — ABNORMAL HIGH (ref 70–99)
Glucose-Capillary: 186 mg/dL — ABNORMAL HIGH (ref 70–99)
Glucose-Capillary: 216 mg/dL — ABNORMAL HIGH (ref 70–99)
Glucose-Capillary: 270 mg/dL — ABNORMAL HIGH (ref 70–99)

## 2024-10-16 LAB — MAGNESIUM: Magnesium: 2.2 mg/dL (ref 1.7–2.4)

## 2024-10-16 MED ORDER — PREDNISOLONE ACETATE 1 % OP SUSP
1.0000 [drp] | Freq: Three times a day (TID) | OPHTHALMIC | Status: DC
  Administered 2024-10-16 – 2024-10-19 (×6): 1 [drp] via OPHTHALMIC
  Filled 2024-10-16: qty 5

## 2024-10-16 NOTE — Progress Notes (Signed)
 Mobility Specialist Progress Note:    10/16/24 1443  Mobility  Activity Ambulated with assistance  Level of Assistance Standby assist, set-up cues, supervision of patient - no hands on  Assistive Device None  Distance Ambulated (ft) 300 ft  Range of Motion/Exercises Active  Activity Response Tolerated well  Mobility Referral Yes  Mobility visit 1 Mobility  Mobility Specialist Start Time (ACUTE ONLY) 1443  Mobility Specialist Stop Time (ACUTE ONLY) 1500  Mobility Specialist Time Calculation (min) (ACUTE ONLY) 17 min   Received pt sitting in recliner agreeable to session. No c/o any symptoms. Pt moving and ambulating well feeling a lot better concerning breathing. Returned pt to recliner w/ all needs met.   Venetia Keel Mobility Specialist Please Neurosurgeon or Rehab Office at [phone]

## 2024-10-16 NOTE — Plan of Care (Signed)
   Problem: Education: Goal: Knowledge of General Education information will improve Description: Including pain rating scale, medication(s)/side effects and non-pharmacologic comfort measures Outcome: Progressing   Problem: Activity: Goal: Risk for activity intolerance will decrease Outcome: Progressing   Problem: Nutrition: Goal: Adequate nutrition will be maintained Outcome: Progressing

## 2024-10-16 NOTE — Progress Notes (Addendum)
 Progress Note   Patient: Peter Scott FMW:[id number] DOB: [birth year] DOA: 10/12/2024     4 DOS: the patient was seen and examined on 10/16/2024   Brief hospital course: Peter Scott was admitted to the hospital with the working diagnosis of heart failure exacerbation.   89 year old with a history of heart failure, coronary artery disease, sp post CABG times 4 in 2001, history of TAVR 2019, pulmonary hypertension with chronic hypoxic respiratory failure on 1 L nasal cannula as outpatient, HTN, HLD, atrial fibrillation and CKD stage IIIb who presented with progressive dyspnea. Patient was noted to have worsening dyspnea for a few weeks prior to admission, worse with exertion and associated with peripheral edema. Because of severe symptoms he presented to the ED.  On his initial physical examination his 02 saturation was 84% and had increased work of breathing.  Blood pressure 151/77, HR 68, RR 15 and 02 saturation 93% on supplemental 02 per [place]  Lungs with decreased breath sounds and bilateral rales, noted increased work of breathing, heart with S1 ands S2 present and regular with no gallops or rubs, positive systolic murmur, abdomen with no distention and positive lower extremity edema, ++ pitting.   Na 138, K 3,7 Cl 100 bicarbonate 23, glucose 214 cr 2.0  BNP 7,326  High sensitive troponin 74 and 63  Wbc 14.0 hgb 10.2 plt 236  INR 1,6   Chest radiograph with hypoinflation, positive cardiomegaly, bilateral hilar vascular congestion, bilateral pleural effusions. Sternotomy wires in place.  EKG 73 bpm, normal axis, normal intervals, manually corrected qtc 430, atrial flutter rhythm 3:1, with no significant ST segment or T wave changes.    Patient was placed on furosemide  for diuresis.   11/27 follow up chest radiograph with persistent pulmonary edema.  11/28 continue volume overloaded.   Assessment and Plan: * Acute on chronic diastolic CHF (congestive heart failure) (HCC) Echocardiogram  with mild reduction in systolic function with EF 45 to 50%, grade II diastolic dysfunction with pseudo normalization, RV systolic function preserved, LA with moderate dilatation, mild to moderate mitral valve regurgitation, mild to moderate tricuspid valve regurgitation, mild aortic stenosis ( sp TAVR)   Urine output is 2,375 ml Systolic blood pressure 130 mmHg.   Acute on chronic hypoxemic respiratory failure due to cardiogenic edema.  11/27 chest radiograph with worsening bilateral central interstitial infiltrates with bilateral pleural effusions, more right than left. Round lesion left hilar region, will repeat imaging after aggressive diuresis.  02 saturation today 95% on 5 L/min per [place]   Coronary artery disease No chest pain, continue with atorvastatin  Not on antiplatelet therapy due to full anticoagulation with apixaban   Paroxysmal atrial fibrillation (HCC) Continue rate control with amiodarone  and anticoagulation with apixaban .  Continue telemetry monitoring  Essential hypertension, benign Continue blood pressure monitor, he will benefit from RAAS inhibition   CKD stage 3b, GFR 30-44 ml/min (HCC) - baseline Scr 1.8-2.0 AKI, Hypokalemia   Today renal function with serum cr at 1,9 with K at 3,9 and serum bicarbonate at 28  Na 136 and Mg 2,2   Continue diuresis with furosemide   Plan to follow up renal function and electrolytes in am.   Type 2 diabetes mellitus with hyperlipidemia (HCC) Uncontrolled with hyperglycemia with hgb A1c of 8,2  Plan to continue insulin  sliding scale for glucose cover and monitoring Continue basal insulin .  Patient is tolerating po well.  Fasting glucose today 230 mg/dl   Continue statin   Subjective: Patient with no chest pain, dyspnea improved from  last night but still not at his baseline, no chest pain   Physical Exam: Vitals:   10/16/24 0453 10/16/24 0507 10/16/24 0844 10/16/24 1137  BP: (!) 123/58  (!) 130/94 139/74  Pulse: 76  77 63   Resp: 18 (!) 22 20 18   Temp: 97.7 F (36.5 C)  97.9 F (36.6 C) 97.8 F (36.6 C)  TempSrc: Oral  Oral Axillary  SpO2: (!) 89%  98% 95%  Weight:  64.4 kg    Height:       Neurology awake and alert, deconditioned ENT with mild pallor with no icterus Cardiovascular with S1 and S2 present and regular with no gallops or rubs, positive systolic murmur at the left lower sternal border Respiratory with bilateral rales with no wheezing or rhonchi  Abdomen with no distention  Positive lower extremity edema pitting ++ ted hose in place  Data Reviewed:    Family Communication: no family at the bedside.  I spoke with patient's son over the phone, we talked in detail about patient's condition, plan of care and prognosis and all questions were addressed.    Disposition: Status is: Inpatient Remains inpatient appropriate because: IV diuresis   Planned Discharge Destination: Home     Author: Elidia Toribio Furnace, MD 10/16/2024 1:11 PM  For on call review [email or web address].

## 2024-10-17 DIAGNOSIS — I2583 Coronary atherosclerosis due to lipid rich plaque: Secondary | ICD-10-CM

## 2024-10-17 DIAGNOSIS — I480 Paroxysmal atrial fibrillation: Secondary | ICD-10-CM | POA: Diagnosis not present

## 2024-10-17 DIAGNOSIS — I2510 Atherosclerotic heart disease of native coronary artery without angina pectoris: Secondary | ICD-10-CM | POA: Diagnosis not present

## 2024-10-17 DIAGNOSIS — I10 Essential (primary) hypertension: Secondary | ICD-10-CM | POA: Diagnosis not present

## 2024-10-17 DIAGNOSIS — I5033 Acute on chronic diastolic (congestive) heart failure: Secondary | ICD-10-CM | POA: Diagnosis not present

## 2024-10-17 LAB — BASIC METABOLIC PANEL WITH GFR
Anion gap: 10 (ref 5–15)
BUN: 56 mg/dL — ABNORMAL HIGH (ref 8–23)
CO2: 29 mmol/L (ref 22–32)
Calcium: 8.1 mg/dL — ABNORMAL LOW (ref 8.9–10.3)
Chloride: 98 mmol/L (ref 98–111)
Creatinine, Ser: 2.14 mg/dL — ABNORMAL HIGH (ref 0.61–1.24)
GFR, Estimated: 29 mL/min — ABNORMAL LOW (ref >60)
Glucose, Bld: 224 mg/dL — ABNORMAL HIGH (ref 70–99)
Potassium: 3.7 mmol/L (ref 3.5–5.1)
Sodium: 137 mmol/L (ref 135–145)

## 2024-10-17 LAB — MAGNESIUM: Magnesium: 2.3 mg/dL (ref 1.7–2.4)

## 2024-10-17 LAB — GLUCOSE, CAPILLARY
Glucose-Capillary: 152 mg/dL — ABNORMAL HIGH (ref 70–99)
Glucose-Capillary: 183 mg/dL — ABNORMAL HIGH (ref 70–99)
Glucose-Capillary: 196 mg/dL — ABNORMAL HIGH (ref 70–99)
Glucose-Capillary: 196 mg/dL — ABNORMAL HIGH (ref 70–99)

## 2024-10-17 NOTE — Progress Notes (Addendum)
 Mobility Specialist Progress Note:   10/17/24 1152  Mobility  Activity Ambulated with assistance  Level of Assistance Standby assist, set-up cues, supervision of patient - no hands on  Assistive Device None  Distance Ambulated (ft) 300 ft  Activity Response Tolerated well  Mobility Referral Yes  Mobility visit 1 Mobility  Mobility Specialist Start Time (ACUTE ONLY) 1110  Mobility Specialist Stop Time (ACUTE ONLY) 1122  Mobility Specialist Time Calculation (min) (ACUTE ONLY) 12 min   Py received in chair agreeable to mobility. No physical assistance needed throughout, supervision for safety. Returned to room w/o fault. C/o fatigue, otherwise tolerated well. Left in chair w/ call bell and personal belongings in reach. All needs met.  Thersia Minder Mobility Specialist  Please contact vis Secure Chat or  Rehab Office [phone]

## 2024-10-17 NOTE — Plan of Care (Signed)

## 2024-10-17 NOTE — Progress Notes (Signed)
 Progress Note   Patient: Peter Scott FMW:[id number] DOB: 12/11/1931 DOA: 10/12/2024     5 DOS: the patient was seen and examined on 10/17/2024   Brief hospital course: Peter Scott was admitted to the hospital with the working diagnosis of heart failure exacerbation.   89 year old with a history of heart failure, coronary artery disease, sp post CABG times 4 in 2001, history of TAVR 2019, pulmonary hypertension with chronic hypoxic respiratory failure on 1 L nasal cannula as outpatient, HTN, HLD, atrial fibrillation and CKD stage IIIb who presented with progressive dyspnea. Patient was noted to have worsening dyspnea for a few weeks prior to admission, worse with exertion and associated with peripheral edema. Because of severe symptoms he presented to the ED.  On his initial physical examination his 02 saturation was 84% and had increased work of breathing.  Blood pressure 151/77, HR 68, RR 15 and 02 saturation 93% on supplemental 02 per [place]  Lungs with decreased breath sounds and bilateral rales, noted increased work of breathing, heart with S1 ands S2 present and regular with no gallops or rubs, positive systolic murmur, abdomen with no distention and positive lower extremity edema, ++ pitting.   Na 138, K 3,7 Cl 100 bicarbonate 23, glucose 214 cr 2.0  BNP 7,326  High sensitive troponin 74 and 63  Wbc 14.0 hgb 10.2 plt 236  INR 1,6   Chest radiograph with hypoinflation, positive cardiomegaly, bilateral hilar vascular congestion, bilateral pleural effusions. Sternotomy wires in place.  EKG 73 bpm, normal axis, normal intervals, manually corrected qtc 430, atrial flutter rhythm 3:1, with no significant ST segment or T wave changes.    Patient was placed on furosemide  for diuresis.   11/27 follow up chest radiograph with persistent pulmonary edema.  11/28 continue volume overloaded.  11/29 improving volume status.   Assessment and Plan: * Acute on chronic diastolic CHF (congestive heart  failure) (HCC) Echocardiogram with mild reduction in systolic function with EF 45 to 50%, grade II diastolic dysfunction with pseudo normalization, RV systolic function preserved, LA with moderate dilatation, mild to moderate mitral valve regurgitation, mild to moderate tricuspid valve regurgitation, mild aortic stenosis ( sp TAVR)   Urine output is 2,400 ml Systolic blood pressure 130 mmHg.   Acute on chronic hypoxemic respiratory failure due to cardiogenic edema.  11/27 chest radiograph with worsening bilateral central interstitial infiltrates with bilateral pleural effusions, more right than left. Round lesion left hilar region, will repeat imaging after aggressive diuresis.   Today with improved 02 saturation up to 100% on 5 L/min per [place]   Coronary artery disease No chest pain, continue with atorvastatin  Not on antiplatelet therapy due to full anticoagulation with apixaban   Paroxysmal atrial fibrillation (HCC) Continue rate control with amiodarone  and anticoagulation with apixaban .  Continue telemetry monitoring  Essential hypertension, benign Continue blood pressure monitor, he will benefit from RAAS inhibition once renal function more stable.   CKD stage 3b, GFR 30-44 ml/min (HCC) - baseline Scr 1.8-2.0 AKI, Hypokalemia   Renal function with serum cr at 2,14 with K at 3,7 and serum bicarbonate at 29  Na 137 and Mg 2.3  Continue diuresis with furosemide  80 mg IV bid  Plan to follow up renal function and electrolytes in am.   Type 2 diabetes mellitus with hyperlipidemia (HCC) Uncontrolled with hyperglycemia with hgb A1c of 8,2  Plan to continue insulin  sliding scale for glucose cover and monitoring Continue basal insulin .  Patient is tolerating po well.  Fasting glucose today  224 mg/dl   Continue statin      Subjective: Patient with improvement in dyspnea and edema, but not yet back to baseline, no chest pain, improved cough.   Physical Exam: Vitals:   10/16/24 1936  10/16/24 2358 10/17/24 0409 10/17/24 0858  BP: 130/60 (!) 113/50 (!) 123/50 (!) 129/58  Pulse: 74 63 66 70  Resp: 18 20 20 20   Temp: 98.1 F (36.7 C) 97.8 F (36.6 C) 98.4 F (36.9 C) 98.1 F (36.7 C)  TempSrc: Oral Oral Oral Oral  SpO2: 93% 95% 97% 100%  Weight:      Height:       Neurology awake and alert ENT with mild pallor  Cardiovascular with S1 and S2 present and regular, positive systolic murmur at the left lower sternal border No JVD Respiratory with mild rales at bases with no wheezing or rhonchi Abdomen with no distention  Positive lower extremity edema pitting +  Data Reviewed:    Family Communication: no family at the bedside   Disposition: Status is: Inpatient Remains inpatient appropriate because: IV diuresis   Planned Discharge Destination: Home     Author: Elidia Toribio Furnace, MD 10/17/2024 10:55 AM  For on call review [email or web address]

## 2024-10-18 DIAGNOSIS — I2510 Atherosclerotic heart disease of native coronary artery without angina pectoris: Secondary | ICD-10-CM | POA: Diagnosis not present

## 2024-10-18 DIAGNOSIS — I10 Essential (primary) hypertension: Secondary | ICD-10-CM | POA: Diagnosis not present

## 2024-10-18 DIAGNOSIS — I5033 Acute on chronic diastolic (congestive) heart failure: Secondary | ICD-10-CM | POA: Diagnosis not present

## 2024-10-18 DIAGNOSIS — I480 Paroxysmal atrial fibrillation: Secondary | ICD-10-CM | POA: Diagnosis not present

## 2024-10-18 LAB — BASIC METABOLIC PANEL WITH GFR
Anion gap: 12 (ref 5–15)
BUN: 57 mg/dL — ABNORMAL HIGH (ref 8–23)
CO2: 27 mmol/L (ref 22–32)
Calcium: 8.2 mg/dL — ABNORMAL LOW (ref 8.9–10.3)
Chloride: 97 mmol/L — ABNORMAL LOW (ref 98–111)
Creatinine, Ser: 2.11 mg/dL — ABNORMAL HIGH (ref 0.61–1.24)
GFR, Estimated: 29 mL/min — ABNORMAL LOW (ref >60)
Glucose, Bld: 145 mg/dL — ABNORMAL HIGH (ref 70–99)
Potassium: 3.2 mmol/L — ABNORMAL LOW (ref 3.5–5.1)
Sodium: 136 mmol/L (ref 135–145)

## 2024-10-18 LAB — MAGNESIUM: Magnesium: 2.3 mg/dL (ref 1.7–2.4)

## 2024-10-18 LAB — GLUCOSE, CAPILLARY
Glucose-Capillary: 119 mg/dL — ABNORMAL HIGH (ref 70–99)
Glucose-Capillary: 200 mg/dL — ABNORMAL HIGH (ref 70–99)
Glucose-Capillary: 166 mg/dL — ABNORMAL HIGH (ref 70–99)

## 2024-10-18 MED ORDER — POTASSIUM CHLORIDE CRYS ER 20 MEQ PO TBCR
40.0000 meq | EXTENDED_RELEASE_TABLET | Freq: Once | ORAL | Status: AC
  Administered 2024-10-18: 40 meq via ORAL
  Filled 2024-10-18: qty 2

## 2024-10-18 NOTE — Progress Notes (Signed)
 Progress Note   Patient: Peter Scott FMW:[id number] DOB: 05/20/1932 DOA: 10/12/2024     6 DOS: the patient was seen and examined on 10/18/2024   Brief hospital course: Peter Scott was admitted to the hospital with the working diagnosis of heart failure exacerbation.   89 year old with a history of heart failure, coronary artery disease, sp post CABG times 4 in 2001, history of TAVR 2019, pulmonary hypertension with chronic hypoxic respiratory failure on 1 L nasal cannula as outpatient, HTN, HLD, atrial fibrillation and CKD stage IIIb who presented with progressive dyspnea. Patient was noted to have worsening dyspnea for a few weeks prior to admission, worse with exertion and associated with peripheral edema. Because of severe symptoms he presented to the ED.  On his initial physical examination his 02 saturation was 84% and had increased work of breathing.  Blood pressure 151/77, HR 68, RR 15 and 02 saturation 93% on supplemental 02 per [place]  Lungs with decreased breath sounds and bilateral rales, noted increased work of breathing, heart with S1 ands S2 present and regular with no gallops or rubs, positive systolic murmur, abdomen with no distention and positive lower extremity edema, ++ pitting.   Na 138, K 3,7 Cl 100 bicarbonate 23, glucose 214 cr 2.0  BNP 7,326  High sensitive troponin 74 and 63  Wbc 14.0 hgb 10.2 plt 236  INR 1,6   Chest radiograph with hypoinflation, positive cardiomegaly, bilateral hilar vascular congestion, bilateral pleural effusions. Sternotomy wires in place.  EKG 73 bpm, normal axis, normal intervals, manually corrected qtc 430, atrial flutter rhythm 3:1, with no significant ST segment or T wave changes.    Patient was placed on furosemide  for diuresis.   11/27 follow up chest radiograph with persistent pulmonary edema.  11/28 continue volume overloaded.  11/29 improving volume status.  11/30 continue to improve volume status, plan to transition to oral  diuretic therapy in the next 24 hrs.   Assessment and Plan: * Acute on chronic diastolic CHF (congestive heart failure) (HCC) Echocardiogram with mild reduction in systolic function with EF 45 to 50%, grade II diastolic dysfunction with pseudo normalization, RV systolic function preserved, LA with moderate dilatation, mild to moderate mitral valve regurgitation, mild to moderate tricuspid valve regurgitation, mild aortic stenosis ( sp TAVR)   Urine output is 2,400 ml Systolic blood pressure 130 mmHg.   Acute on chronic hypoxemic respiratory failure due to cardiogenic edema.  11/27 chest radiograph with worsening bilateral central interstitial infiltrates with bilateral pleural effusions, more right than left. Round lesion left hilar region, will repeat imaging after aggressive diuresis.   Today with improved 02 saturation up to 100% on 5 L/min per [place]   Coronary artery disease No chest pain, continue with atorvastatin  Not on antiplatelet therapy due to full anticoagulation with apixaban   Paroxysmal atrial fibrillation (HCC) Continue rate control with amiodarone  and anticoagulation with apixaban .  Continue telemetry monitoring  Essential hypertension, benign Continue blood pressure monitor, he will benefit from RAAS inhibition once renal function more stable.   CKD stage 3b, GFR 30-44 ml/min (HCC) - baseline Scr 1.8-2.0 AKI, Hypokalemia   Renal function with serum cr at 2,14 with K at 3,7 and serum bicarbonate at 29  Na 137 and Mg 2.3  Continue diuresis with furosemide  80 mg IV bid  Plan to follow up renal function and electrolytes in am.   Type 2 diabetes mellitus with hyperlipidemia (HCC) Uncontrolled with hyperglycemia with hgb A1c of 8,2  Plan to continue insulin  sliding  scale for glucose cover and monitoring Continue basal insulin .  Patient is tolerating po well.  Fasting glucose today 224 mg/dl   Continue statin         Subjective: Patient is feeling better, no  chest pain, dyspnea is improving.   Physical Exam: Vitals:   10/17/24 0858 10/17/24 1300 10/17/24 1655 10/18/24 0925  BP: (!) 129/58 (!) 134/59 (!) 140/58 118/79  Pulse: 70 67 83 69  Resp: 20 18 19    Temp: 98.1 F (36.7 C) 98 F (36.7 C) 98.2 F (36.8 C) 97.9 F (36.6 C)  TempSrc: Oral Oral Oral Oral  SpO2: 100% 100% 99% 97%  Weight:      Height:       Neurology awake and alert, deconditioned ENT with mild pallor with no icterus Cardiovascular with S1 and S2 present irregularly irregular with no gallops or rubs, positive systolic murmur at the left lower sternal border Respiratory with mild rales at bases with no wheezing Abdomen with no distention, soft and non tender Lower extremity edema +, ted hose in place  Data Reviewed:    Family Communication: no family at the bedside   Disposition: Status is: Inpatient Remains inpatient appropriate because: IV diuresis   Planned Discharge Destination: Home     Author: Elidia Toribio Furnace, MD 10/18/2024 1:40 PM  For on call review [email or web address].

## 2024-10-18 NOTE — Plan of Care (Signed)
  Problem: Elimination: Goal: Will not experience complications related to bowel motility Outcome: Completed/Met Goal: Will not experience complications related to urinary retention Outcome: Completed/Met   Problem: Pain Managment: Goal: General experience of comfort will improve and/or be controlled Outcome: Completed/Met   Problem: Skin Integrity: Goal: Risk for impaired skin integrity will decrease Outcome: Completed/Met

## 2024-10-18 NOTE — Plan of Care (Signed)

## 2024-10-19 DIAGNOSIS — D509 Iron deficiency anemia, unspecified: Secondary | ICD-10-CM

## 2024-10-19 DIAGNOSIS — I5033 Acute on chronic diastolic (congestive) heart failure: Secondary | ICD-10-CM | POA: Diagnosis not present

## 2024-10-19 DIAGNOSIS — I480 Paroxysmal atrial fibrillation: Secondary | ICD-10-CM | POA: Diagnosis not present

## 2024-10-19 DIAGNOSIS — I2510 Atherosclerotic heart disease of native coronary artery without angina pectoris: Secondary | ICD-10-CM | POA: Diagnosis not present

## 2024-10-19 DIAGNOSIS — I10 Essential (primary) hypertension: Secondary | ICD-10-CM | POA: Diagnosis not present

## 2024-10-19 LAB — MAGNESIUM: Magnesium: 2.2 mg/dL (ref 1.7–2.4)

## 2024-10-19 LAB — BASIC METABOLIC PANEL WITH GFR
Anion gap: 7 (ref 5–15)
BUN: 62 mg/dL — ABNORMAL HIGH (ref 8–23)
CO2: 30 mmol/L (ref 22–32)
Calcium: 8 mg/dL — ABNORMAL LOW (ref 8.9–10.3)
Chloride: 100 mmol/L (ref 98–111)
Creatinine, Ser: 2.37 mg/dL — ABNORMAL HIGH (ref 0.61–1.24)
GFR, Estimated: 25 mL/min — ABNORMAL LOW (ref >60)
Glucose, Bld: 201 mg/dL — ABNORMAL HIGH (ref 70–99)
Potassium: 3.9 mmol/L (ref 3.5–5.1)
Sodium: 137 mmol/L (ref 135–145)

## 2024-10-19 LAB — GLUCOSE, CAPILLARY
Glucose-Capillary: 162 mg/dL — ABNORMAL HIGH (ref 70–99)
Glucose-Capillary: 187 mg/dL — ABNORMAL HIGH (ref 70–99)

## 2024-10-19 MED ORDER — GUAIFENESIN ER 600 MG PO TB12: 600.0000 mg | ORAL_TABLET | Freq: Two times a day (BID) | ORAL | 0 refills | Status: AC

## 2024-10-19 MED ORDER — FERROUS SULFATE 325 (65 FE) MG PO TABS: 325.0000 mg | ORAL_TABLET | Freq: Every day | ORAL | 0 refills | Status: AC

## 2024-10-19 MED ORDER — ADULT MULTIVITAMIN W/MINERALS CH: 1.0000 | ORAL_TABLET | Freq: Every day | ORAL | 0 refills | Status: AC

## 2024-10-19 MED ORDER — FERROUS SULFATE 325 (65 FE) MG PO TABS: 325.0000 mg | ORAL_TABLET | Freq: Every day | ORAL | Status: DC

## 2024-10-19 MED ORDER — PREDNISOLONE ACETATE 1 % OP SUSP: 1.0000 [drp] | Freq: Three times a day (TID) | OPHTHALMIC | Status: AC

## 2024-10-19 MED ORDER — TORSEMIDE 20 MG PO TABS: 80.0000 mg | ORAL_TABLET | Freq: Two times a day (BID) | ORAL | Status: DC

## 2024-10-19 MED ORDER — TORSEMIDE 20 MG PO TABS: 80.0000 mg | ORAL_TABLET | Freq: Two times a day (BID) | ORAL | 0 refills | Status: DC

## 2024-10-19 NOTE — Plan of Care (Signed)

## 2024-10-19 NOTE — Progress Notes (Signed)
 Mobility Specialist Progress Note:    10/19/24 1034  Mobility  Activity Ambulated with assistance  Level of Assistance Standby assist, set-up cues, supervision of patient - no hands on  Assistive Device None  Distance Ambulated (ft) 50 ft  Range of Motion/Exercises Active  Activity Response Tolerated well  Mobility Referral Yes  Mobility visit 1 Mobility  Mobility Specialist Start Time (ACUTE ONLY) 1034  Mobility Specialist Stop Time (ACUTE ONLY) 1044  Mobility Specialist Time Calculation (min) (ACUTE ONLY) 10 min   Received pt laying in bed agreeable to session. No c/o any symptoms. Pts moving and ambulating well feeling better. Returned pt to bed w/ all needs met.   Venetia Keel Mobility Specialist Please Neurosurgeon or Rehab Office at [phone]

## 2024-10-19 NOTE — Progress Notes (Signed)
 Reviewed AVS, patient expressed understanding of medications, MD follow up reviewed.   Removed IV, Site clean, dry and intact.  Patient states all belongings brought to the hospital at time of admission are accounted for and packed to take home.  Patient informed and expressed understanding where to pick up discharge medications.  [facility] contacted to transport patient to Discharge lounge to wait for transportation home.

## 2024-10-19 NOTE — Discharge Summary (Addendum)
 Physician Discharge Summary   Patient: Peter Scott MRN: [id number] DOB: 1932/09/14  Admit date:     10/12/2024  Discharge date: 10/19/24  Discharge Physician: Elidia Sieving Evaline Waltman   PCP: Watt Harlene BROCKS, MD   Recommendations at discharge:    Torsemide  increased to 80 mg po bid, limited guideline directed medical therapy due to risk of hypotension and acutely reduced GFR. Follow up renal function and electrolytes in 7 days as outpatient Advised to weight every morning and keep a log, in case of rapid weight gain 2 to 3 lbs in 24 hr or 5 lbs in 24 hrs to call medical provider.  Follow up with Dr Watt in 7 to 10 days Follow up with Cardiology as scheduled.   I spoke with patient's son over the phone, we talked in detail about patient's condition, plan of care and prognosis and all questions were addressed.   Discharge Diagnoses: Principal Problem:   Acute on chronic diastolic CHF (congestive heart failure) (HCC) Active Problems:   Coronary artery disease   Essential hypertension, benign   Paroxysmal atrial fibrillation (HCC)   CKD stage 3b, GFR 30-44 ml/min (HCC) - baseline Scr 1.8-2.0   Type 2 diabetes mellitus with hyperlipidemia (HCC)  Resolved Problems:   * No resolved hospital problems. [place] Hospital Course: Peter Scott was admitted to the hospital with the working diagnosis of heart failure exacerbation.   89 year old with a history of heart failure, coronary artery disease, sp post CABG times 4 in 2001, history of TAVR 2019, pulmonary hypertension with chronic hypoxic respiratory failure on 1 L nasal cannula as outpatient, HTN, HLD, atrial fibrillation and CKD stage IIIb who presented with progressive dyspnea. Patient was noted to have worsening dyspnea for a few weeks prior to admission, worse with exertion and associated with peripheral edema. Because of severe symptoms he presented to the ED.  On his initial physical examination his 02 saturation was 84% and had  increased work of breathing.  Blood pressure 151/77, HR 68, RR 15 and 02 saturation 93% on supplemental 02 per [place]  Lungs with decreased breath sounds and bilateral rales, noted increased work of breathing, heart with S1 ands S2 present and regular with no gallops or rubs, positive systolic murmur, abdomen with no distention and positive lower extremity edema, ++ pitting.   Na 138, K 3,7 Cl 100 bicarbonate 23, glucose 214 cr 2.0  BNP 7,326  High sensitive troponin 74 and 63  Wbc 14.0 hgb 10.2 plt 236  INR 1,6   Chest radiograph with hypoinflation, positive cardiomegaly, bilateral hilar vascular congestion, bilateral pleural effusions. Sternotomy wires in place.  EKG 73 bpm, normal axis, normal intervals, manually corrected qtc 430, atrial flutter rhythm 3:1, with no significant ST segment or T wave changes.    Patient was placed on furosemide  for diuresis.   11/27 follow up chest radiograph with persistent pulmonary edema.  11/28 continue volume overloaded.  11/29 improving volume status.  11/30 continue to improve volume status, plan to transition to oral diuretic therapy in the next 24 hrs.  12/01 patient clinically stable for discharge, will follow up as outpatient.   Assessment and Plan: * Acute on chronic diastolic CHF (congestive heart failure) (HCC) Echocardiogram with mild reduction in systolic function with EF 45 to 50%, grade II diastolic dysfunction with pseudo normalization EA, RV systolic function preserved, LA with moderate dilatation, mild to moderate mitral valve regurgitation, mild to moderate tricuspid valve regurgitation, mild aortic stenosis ( sp TAVR)  Patient was placed on IV furosemide  for diuresis, negative fluid balance was achieved, - 11,102 ml, with significant improvement in his symptoms.   Acute on chronic hypoxemic respiratory failure due to cardiogenic edema.  Oxygenation and dyspnea has improved.   At the time of discharge his 02 saturation was up to  100% on 3 L/min per [place]   Coronary artery disease No chest pain, continue with atorvastatin  Not on antiplatelet therapy due to full anticoagulation with apixaban   High sensitive troponin elevation due to heart failure, no acute coronary syndrome.  Paroxysmal atrial fibrillation (HCC) Continue rate control with amiodarone  and anticoagulation with apixaban .   Essential hypertension, benign Patient will be on hold on losartan  for now due to risk of hypotension and acutely reduced GFR. Follow up blood pressure as outpatient,   CKD stage 3b, GFR 30-44 ml/min (HCC) - baseline Scr 1.8-2.0 AKI, Hypokalemia   At the time of discharge his renal function has a serum cr of 2,37 with K at 3,9 and serum bicarbonate at 30  Na 137 and Mg 2.2   Plan to continue diuresis with torsemide  80 mg po bid  Follow up renal function and electrolytes as outpatient in  7 days.   Type 2 diabetes mellitus with hyperlipidemia (HCC) Uncontrolled with hyperglycemia with hgb A1c of 8,2  Patient was placed oninsulin sliding scale for glucose cover and monitoring Continue basal insulin .  Patient is tolerating po well.   Continue statin   Iron deficiency anemia Iron panel with serum iron of 13, TIBC 302, transferrin saturation of 4 and ferritin of 65 Patient was placed on oral iron supplementation and plan to follow up iron stores as outpatient.  Discharge hgb is 8.9        Consultants: none  Procedures performed: none   Disposition: Home Diet recommendation:  Cardiac diet DISCHARGE MEDICATION: Allergies as of 10/19/2024       Reactions   Glucotrol [glipizide] Palpitations, Other (See Comments)   Tachycardia         Medication List     STOP taking these medications    amLODipine  10 MG tablet Commonly known as: NORVASC    furosemide  20 MG tablet Commonly known as: LASIX    losartan  100 MG tablet Commonly known as: COZAAR    potassium chloride  SA 20 MEQ tablet Commonly known as: KLOR-CON   M       TAKE these medications    acetaminophen  500 MG tablet Commonly known as: TYLENOL  Take 500 mg by mouth every 6 (six) hours as needed for fever or headache (pain).   amiodarone  200 MG tablet Commonly known as: PACERONE  Take 1 tablet (200 mg total) by mouth daily.   apixaban  5 MG Tabs tablet Commonly known as: ELIQUIS  Take 2.5 mg by mouth 2 (two) times daily.   atorvastatin  40 MG tablet Commonly known as: LIPITOR Take 40 mg by mouth at bedtime.   Contour Next Test test strip Generic drug: glucose blood Use once a day to check blood sugar.  Dx code: E11.9   guaiFENesin  600 MG 12 hr tablet Commonly known as: MUCINEX  Take 1 tablet (600 mg total) by mouth 2 (two) times daily.   multivitamin with minerals Tabs tablet Take 1 tablet by mouth daily. Start taking on: October 20, 2024   OXYGEN  Inhale 1 L/min into the lungs daily as needed.   prednisoLONE  acetate 1 % ophthalmic suspension Commonly known as: PRED FORTE  Place 1 drop into the right eye 3 (three) times daily. What changed: when to  take this   Semglee  (yfgn) 100 UNIT/ML Pen Generic drug: insulin  glargine Inject 11 Units into the skin at bedtime.   torsemide  20 MG tablet Commonly known as: DEMADEX  Take 4 tablets (80 mg total) by mouth 2 (two) times daily. What changed:  how much to take when to take this additional instructions               Durable Medical Equipment  (From admission, onward)           Start     Ordered   10/13/24 1419  For home use only DME Other see comment  Once       Comments: Dispense POC at 1 liter per minute  Question:  Length of Need  Answer:  Lifetime   10/13/24 1419            Contact information for follow-up providers     Copland, Harlene BROCKS, MD Follow up on 10/26/2024.   Specialty: Family Medicine Why: 1:40 for hospital follow up Contact information: [address] [place] [address] [phone]              Contact  information for after-discharge care     Home Medical Care     [facility]) .   Service: Home Health Services Why: Agency will call you to set up apt times Contact information: [address] [place]   [address] [phone]                    Discharge Exam: Peter Scott   10/12/24 1626 10/15/24 0523 10/16/24 0507  Weight: 66.9 kg 65.5 kg 64.4 kg   BP (!) 109/53 (BP Location: Left Arm)   Pulse 71   Temp (!) 97.5 F (36.4 C) (Oral)   Resp 18   Ht 5' 8 (1.727 m)   Wt 64.4 kg   SpO2 96%   BMI 21.59 kg/m   Patient is feeling better, no dyspnea and no chest pain, no PND or orthopnea, trace lower extremity edema  Neurology awake and alert ENT with mild pallor with no icterus Cardiovascular with S1 and S2 present, irregularly irregular with no gallops or rubs, positive systolic murmur at the left lower sternal border Respiratory with no rales or wheezing, no rhonchi Abdomen with no distention  Trace ankle edema, ted hose in place   Condition at discharge: stable  The results of significant diagnostics from this hospitalization (including imaging, microbiology, ancillary and laboratory) are listed below for reference.   Imaging Studies: DG Chest Port 1 View Result Date: 10/15/2024 EXAM: 1 VIEW(S) XRAY OF THE CHEST 10/15/2024 02:09:00 AM COMPARISON: 10/12/2024 CLINICAL HISTORY: SOB (shortness of breath) FINDINGS: LUNGS AND PLEURA: Bilateral airspace disease likely reflects edema, similar to the prior study. Elevation of the right hemidiaphragm. Small bilateral effusions with bibasilar atelectasis. Rounded masslike opacity in the left hilar region, new since prior study. No pneumothorax. HEART AND MEDIASTINUM: Prior CABG and aortic valve replacement. Cardiomegaly. BONES AND SOFT TISSUES: No acute osseous abnormality. IMPRESSION: 1. Bilateral airspace disease likely reflecting edema, similar to the prior study. 2. Rounded  masslike opacity in the left hilar region, new since the prior study. This could be further evaluated with chest CT with iv contrast. 3. Cardiomegaly and small bilateral effusions with bibasilar atelectasis. Electronically signed by: Franky Crease MD 10/15/2024 02:14 AM EST RP Workstation: [id number]   DG Chest Port 1 View Result Date: 10/12/2024 EXAM: 1  VIEW(S) XRAY OF THE CHEST 10/12/2024 08:20:00 AM COMPARISON: 07/23/2024 CLINICAL HISTORY: 89 year old male with shortness of breath. FINDINGS: LINES, TUBES AND DEVICES: TAVR (Transcatheter Aortic Valve Replacement) noted LUNGS AND PLEURA: Low lung volumes. Small bilateral pleural effusions. Pulmonary interstitial edema, symmetric bilaterally. No pneumothorax. HEART AND MEDIASTINUM: Chronic cardiomegaly. Atherosclerotic calcifications. Median sternotomy noted. Superimposed moderate chronic gastric hiatal hernia. BONES AND SOFT TISSUES: No acute osseous abnormality. IMPRESSION: 1. Acute pulmonary edema with Small bilateral pleural effusions. Electronically signed by: Helayne Hurst MD 10/12/2024 08:25 AM EST RP Workstation: [id number]    Microbiology: Results for orders placed or performed during the hospital encounter of 07/14/24  Resp panel by RT-PCR (RSV, Flu A&B, Covid) Anterior Nasal Swab     Status: None   Collection Time: 07/14/24 10:47 AM   Specimen: Anterior Nasal Swab  Result Value Ref Range Status   SARS Coronavirus 2 by RT PCR NEGATIVE NEGATIVE Final    Comment: (NOTE) SARS-CoV-2 target nucleic acids are NOT DETECTED.  The SARS-CoV-2 RNA is generally detectable in upper respiratory specimens during the acute phase of infection. The lowest concentration of SARS-CoV-2 viral copies this assay can detect is 138 copies/mL. A negative result does not preclude SARS-Cov-2 infection and should not be used as the sole basis for treatment or other patient management decisions. A negative result may occur with  improper specimen collection/handling,  submission of specimen other than nasopharyngeal swab, presence of viral mutation(s) within the areas targeted by this assay, and inadequate number of viral copies(<138 copies/mL). A negative result must be combined with clinical observations, patient history, and epidemiological information. The expected result is Negative.  Fact Sheet for Patients:  bloggercourse.com  Fact Sheet for Healthcare Providers:  seriousbroker.it  This test is no t yet approved or cleared by the [place]  FDA and  has been authorized for detection and/or diagnosis of SARS-CoV-2 by FDA under an Emergency Use Authorization (EUA). This EUA will remain  in effect (meaning this test can be used) for the duration of the COVID-19 declaration under Section 564(b)(1) of the Act, 21 U.S.C.section 360bbb-3(b)(1), unless the authorization is terminated  or revoked sooner.       Influenza A by PCR NEGATIVE NEGATIVE Final   Influenza B by PCR NEGATIVE NEGATIVE Final    Comment: (NOTE) The Xpert Xpress SARS-CoV-2/FLU/RSV plus assay is intended as an aid in the diagnosis of influenza from Nasopharyngeal swab specimens and should not be used as a sole basis for treatment. Nasal washings and aspirates are unacceptable for Xpert Xpress SARS-CoV-2/FLU/RSV testing.  Fact Sheet for Patients: bloggercourse.com  Fact Sheet for Healthcare Providers: seriousbroker.it  This test is not yet approved or cleared by the [place]  FDA and has been authorized for detection and/or diagnosis of SARS-CoV-2 by FDA under an Emergency Use Authorization (EUA). This EUA will remain in effect (meaning this test can be used) for the duration of the COVID-19 declaration under Section 564(b)(1) of the Act, 21 U.S.C. section 360bbb-3(b)(1), unless the authorization is terminated or revoked.     Resp Syncytial Virus by PCR NEGATIVE  NEGATIVE Final    Comment: (NOTE) Fact Sheet for Patients: bloggercourse.com  Fact Sheet for Healthcare Providers: seriousbroker.it  This test is not yet approved or cleared by the [place]  FDA and has been authorized for detection and/or diagnosis of SARS-CoV-2 by FDA under an Emergency Use Authorization (EUA). This EUA will remain in effect (meaning this test can be used) for the duration of the COVID-19 declaration under  Section 564(b)(1) of the Act, 21 U.S.C. section 360bbb-3(b)(1), unless the authorization is terminated or revoked.  Performed at [facility], [address] [place], [place] [address]     Labs: CBC: Recent Labs  Lab 10/13/24 0247 10/14/24 0237 10/15/24 0229  WBC 11.8* 11.6* 11.5*  NEUTROABS  --   --  9.3*  HGB 8.8* 8.7* 8.9*  HCT 26.8* 27.0* 27.2*  MCV 87.0 87.4 87.2  PLT 200 206 221   Basic Metabolic Panel: Recent Labs  Lab 10/15/24 0229 10/16/24 1029 10/17/24 0222 10/18/24 0159 10/19/24 0147  NA 137 136 137 136 137  K 4.1 3.9 3.7 3.2* 3.9  CL 98 97* 98 97* 100  CO2 26 28 29 27 30   GLUCOSE 164* 230* 224* 145* 201*  BUN 54* 52* 56* 57* 62*  CREATININE 2.12* 1.90* 2.14* 2.11* 2.37*  CALCIUM  8.3* 8.4* 8.1* 8.2* 8.0*  MG 2.1 2.2 2.3 2.3 2.2   Liver Function Tests: No results for input(s): AST, ALT, ALKPHOS, BILITOT, PROT, ALBUMIN  in the last 168 hours. CBG: Recent Labs  Lab 10/18/24 0642 10/18/24 1120 10/18/24 1617 10/19/24 0636 10/19/24 1110  GLUCAP 119* 200* 166* 162* 187*    Discharge time spent: greater than 30 minutes.  Signed: Elidia Toribio Furnace, MD [facility] 10/19/2024

## 2024-10-19 NOTE — Progress Notes (Signed)
 Physical Therapy Treatment Patient Details Name: Peter Scott MRN: [id number] DOB: 20-Jun-1932 Today's Date: 10/19/2024   History of Present Illness Pt is a 89 y.o male admitted 11/24 for SOB and DOE. Admitted for acute on chronic hypoxic respiratory failure in setting of CHF exacerbation.  PMH: HTN, CHF, pulmonary HTN, TAVR, afib, DM, CABG    PT Comments  Pt tolerated session well, ambulating and negotiating steps without an AD and no physical assistance given. Pt continues to feel SOB following short bouts of mobility requiring frequent seated rest breaks throughout session. PT will continue to treat pt while he is admitted. Recommending HHPT at discharge to address remaining mobility deficits and optimize return to PLOF.     If plan is discharge home, recommend the following: A little help with walking and/or transfers;A little help with bathing/dressing/bathroom;Assistance with cooking/housework;Assist for transportation;Help with stairs or ramp for entrance   Can travel by private vehicle        Equipment Recommendations  None recommended by PT    Recommendations for Other Services       Precautions / Restrictions Precautions Precautions: Fall Recall of Precautions/Restrictions: Intact Precaution/Restrictions Comments: watch SpO2 3-4L this session Restrictions Weight Bearing Restrictions Per Provider Order: No     Mobility  Bed Mobility Overal bed mobility: Independent             General bed mobility comments: Received sitting EOB    Transfers Overall transfer level: Independent Equipment used: None               General transfer comment: Pt completed multiple sit to stands throughout session w/ no LOB noted    Ambulation/Gait Ambulation/Gait assistance: Supervision Gait Distance (Feet): 40 Feet (x2 trials w/ seated rest break in between) Assistive device: None Gait Pattern/deviations: Step-through pattern, Decreased stride length Gait velocity:  reduced Gait velocity interpretation: <1.8 ft/sec, indicate of risk for recurrent falls   General Gait Details: Pt will occasionally reach for support from surface but no LOB noted throughout gait. Pt demonstrates reduced cadence and reduced gait speed with reduced weight shift L and R and decreased knee flexion bilaterally.   Stairs Stairs: Yes Stairs assistance: Supervision Stair Management: No rails, Forwards Number of Stairs: 4 General stair comments: Pt negotiates stairs forwards w/out UE support, demonstrating good control w/ descent. Pt occasionally has slight posterior LOB w/ descent due to foot catching edge of step requiring cues to look for it.   Wheelchair Mobility     Tilt Bed    Modified Rankin (Stroke Patients Only)       Balance Overall balance assessment: Needs assistance Sitting-balance support: No upper extremity supported, Feet supported Sitting balance-Leahy Scale: Good Sitting balance - Comments: seated EOB w/out LOB   Standing balance support: No upper extremity supported, During functional activity Standing balance-Leahy Scale: Fair Standing balance comment: able to shift weight throughout gait w/ no LOB                            Communication Communication Communication: No apparent difficulties  Cognition Arousal: Alert Behavior During Therapy: WFL for tasks assessed/performed   PT - Cognitive impairments: No apparent impairments                       PT - Cognition Comments: occasionally tangenitial but is able to be redirected to task and remain on task following cueing Following commands: Intact  Cueing Cueing Techniques: Verbal cues  Exercises      General Comments General comments (skin integrity, edema, etc.): on 2L via Black at start of session, bumping up to 3L following stair negotiation due to Sp02 reading 84%and not improving w/ seated rest break or pursed lip breathing. End of session, pt left on 2L w/  Sp02 reading 93%      Pertinent Vitals/Pain Pain Assessment Pain Assessment: No/denies pain    Home Living                          Prior Function            PT Goals (current goals can now be found in the care plan section) Acute Rehab PT Goals Patient Stated Goal: to be able to walk on his son's farm PT Goal Formulation: With patient Time For Goal Achievement: 10/26/24 Potential to Achieve Goals: Good Progress towards PT goals: Progressing toward goals    Frequency    Min 1X/week      PT Plan      Co-evaluation              AM-PAC PT 6 Clicks Mobility   Outcome Measure  Help needed turning from your back to your side while in a flat bed without using bedrails?: None Help needed moving from lying on your back to sitting on the side of a flat bed without using bedrails?: None Help needed moving to and from a bed to a chair (including a wheelchair)?: None Help needed standing up from a chair using your arms (e.g., wheelchair or bedside chair)?: A Little Help needed to walk in hospital room?: A Little Help needed climbing 3-5 steps with a railing? : A Little 6 Click Score: 21    End of Session Equipment Utilized During Treatment: Gait belt;Oxygen  Activity Tolerance: Patient tolerated treatment well Patient left: in bed;with call bell/phone within reach Nurse Communication: Mobility status PT Visit Diagnosis: Unsteadiness on feet (R26.81)     Time: 9147-9089 PT Time Calculation (min) (ACUTE ONLY): 18 min  Charges:    $Gait Training: 8-22 mins PT General Charges $$ ACUTE PT VISIT: 1 Visit                     Leontine Hilt DPT Acute Rehab Services [phone] Prefer contact via chat    Leontine NOVAK Kurstin Dimarzo 10/19/2024, 9:54 AM

## 2024-10-19 NOTE — TOC Transition Note (Signed)
 Transition of Care [facility]) - Discharge Note   Patient Details  Name: Peter Scott MRN: [id number] Date of Birth: October 15, 1932  Transition of Care [facility]) CM/SW Contact:  Waddell Barnie Rama, RN Phone Number: 10/19/2024, 11:48 AM   Clinical Narrative:    For dc today,  NCM notified Jermaine with [facility] also .  He has transportation at [employer].         Patient Goals and CMS Choice            Discharge Placement                       Discharge Plan and Services Additional resources added to the After Visit Summary for                                       Social Drivers of Health (SDOH) Interventions SDOH Screenings   Food Insecurity: No Food Insecurity (10/12/2024)  Housing: Low Risk  (10/12/2024)  Transportation Needs: No Transportation Needs (10/12/2024)  Utilities: Not At Risk (10/12/2024)  Alcohol Screen: Low Risk  (06/28/2021)  Depression (PHQ2-9): Low Risk  (07/23/2024)  Financial Resource Strain: Low Risk  (09/08/2021)   Received from [facility]  Physical Activity: Sufficiently Active (09/08/2021)   Received from [facility]  Social Connections: Moderately Integrated (10/12/2024)  Stress: No Stress Concern Present (09/08/2021)   Received from [facility]  Tobacco Use: Medium Risk (09/02/2024)     Readmission Risk Interventions    10/13/2024    1:51 PM  Readmission Risk Prevention Plan  Transportation Screening Complete  PCP or Specialist Appt within 5-7 Days Complete  Home Care Screening Complete  Medication Review (RN CM) Complete

## 2024-10-19 NOTE — Assessment & Plan Note (Addendum)
 Iron panel with serum iron of 13, TIBC 302, transferrin saturation of 4 and ferritin of 65 Patient was placed on oral iron supplementation and plan to follow up iron stores as outpatient.  Discharge hgb is 8.9

## 2024-10-19 NOTE — TOC Progression Note (Signed)
 Transition of Care [facility]) - Progression Note    Patient Details  Name: Peter Scott MRN: [id number] Date of Birth: [birth year]  Transition of Care [facility]) CM/SW Contact  Waddell Barnie Rama, RN Phone Number: 10/19/2024, 11:00 AM  Clinical Narrative:    Per pt eval rec HHPT, NCM offered choice, patient has no preference.   Referral sent to [facility] thru portal.                       Expected Discharge Plan and Services                                               Social Drivers of Health (SDOH) Interventions SDOH Screenings   Food Insecurity: No Food Insecurity (10/12/2024)  Housing: Low Risk  (10/12/2024)  Transportation Needs: No Transportation Needs (10/12/2024)  Utilities: Not At Risk (10/12/2024)  Alcohol Screen: Low Risk  (06/28/2021)  Depression (PHQ2-9): Low Risk  (07/23/2024)  Financial Resource Strain: Low Risk  (09/08/2021)   Received from [facility]  Physical Activity: Sufficiently Active (09/08/2021)   Received from [facility]  Social Connections: Moderately Integrated (10/12/2024)  Stress: No Stress Concern Present (09/08/2021)   Received from [facility]  Tobacco Use: Medium Risk (09/02/2024)    Readmission Risk Interventions    10/13/2024    1:51 PM  Readmission Risk Prevention Plan  Transportation Screening Complete  PCP or Specialist Appt within 5-7 Days Complete  Home Care Screening Complete  Medication Review (RN CM) Complete

## 2024-10-19 NOTE — Plan of Care (Signed)
   Problem: Education: Goal: Knowledge of General Education information will improve Description Including pain rating scale, medication(s)/side effects and non-pharmacologic comfort measures Outcome: Progressing

## 2024-10-20 ENCOUNTER — Telehealth: Payer: Self-pay

## 2024-10-20 NOTE — Progress Notes (Unsigned)
 [facility] [address], [address] [place], [place] [address] [phone] [phone]  Date:  10/22/2024   Name:  Peter Scott   DOB:  September 11, 1932   MRN:  [id number]  PCP:  Watt Harlene BROCKS, MD    Chief Complaint: No chief complaint on file.   History of Present Illness:  Peter Scott is a 89 y.o. very pleasant male patient who presents with the following:  Patient seen today for follow-up from recent hospitalization History of diabetes, atrial fibrillation, aortic valve replacement, CABG, skin cancer, hypertension, hyperlipidemia, CHF, CRI He does have a nephrologist at the [place]  I saw him most recently in September-at that time he had recently been treated through the hospital at home program for CHF exacerbation and hypoxia He did see his outpatient cardiologist, Dr. Pietro in mid October 1 paroxysmal atrial fibrillation-patient remains in sinus rhythm today.  Continue amiodarone  and apixaban  at present dose.  Recent chest x-ray unrevealing.  Check TSH and liver functions. 2 chronic diastolic congestive heart failure-repeat echocardiogram.  Mildly volume overloaded on examination.  Discontinue Lasix  and treat with Demadex  40 mg in the morning and 20 mg in the evening.  Check potassium and renal function in 1 week. 3 status post TAVR-most recent echocardiogram showed mean gradient 14 mmHg and trace to mild aortic insufficiency.  Continue SBE prophylaxis. 4 mitral regurgitation-mild to moderate on most recent echocardiogram.  Will repeat study. 5 hyperlipidemia-continue statin. 6 hypertension-BP controlled; continue present meds and follow.  7 coronary artery disease-continue statin. 8 chronic stage IIIa kidney disease-we are trying to balance maintaining euvolemia versus worsening kidney function.  Check potassium and renal function 1 week after making diuretic change.  He was recently admitted for several days, 11/24 through 12/1 again  with a heart failure exacerbation Recommendations at discharge:   Torsemide  increased to 80 mg po bid, limited guideline directed medical therapy due to risk of hypotension and acutely reduced GFR. Follow up renal function and electrolytes in 7 days as outpatient Advised to weight every morning and keep a log, in case of rapid weight gain 2 to 3 lbs in 24 hr or 5 lbs in 24 hrs to call medical provider.  Follow up with Dr Watt in 7 to 10 days Follow up with Cardiology as scheduled.  89 year old with a history of heart failure, coronary artery disease, sp post CABG times 4 in 2001, history of TAVR 2019, pulmonary hypertension with chronic hypoxic respiratory failure on 1 L nasal cannula as outpatient, HTN, HLD, atrial fibrillation and CKD stage IIIb who presented with progressive dyspnea. Patient was noted to have worsening dyspnea for a few weeks prior to admission, worse with exertion and associated with peripheral edema. Because of severe symptoms he presented to the ED.  On his initial physical examination his 02 saturation was 84% and had increased work of breathing.   Assessment and Plan: * Acute on chronic diastolic CHF (congestive heart failure) (HCC) Echocardiogram with mild reduction in systolic function with EF 45 to 50%, grade II diastolic dysfunction with pseudo normalization EA, RV systolic function preserved, LA with moderate dilatation, mild to moderate mitral valve regurgitation, mild to moderate tricuspid valve regurgitation, mild aortic stenosis ( sp TAVR)  Patient was placed on IV furosemide  for diuresis, negative fluid balance was achieved, - 11,102 ml, with significant improvement in his symptoms.  Acute on chronic hypoxemic respiratory failure due to cardiogenic edema.  Oxygenation and dyspnea has  improved.  At the time of discharge his 02 saturation was up to 100% on 3 L/min per [name]    Coronary artery disease No chest pain, continue with atorvastatin  Not on antiplatelet  therapy due to full anticoagulation with apixaban  High sensitive troponin elevation due to heart failure, no acute coronary syndrome.   Paroxysmal atrial fibrillation (HCC) Continue rate control with amiodarone  and anticoagulation with apixaban .    Essential hypertension, benign Patient will be on hold on losartan  for now due to risk of hypotension and acutely reduced GFR. Follow up blood pressure as outpatient,    CKD stage 3b, GFR 30-44 ml/min (HCC) - baseline Scr 1.8-2.0 AKI, Hypokalemia    At the time of discharge his renal function has a serum cr of 2,37 with K at 3,9 and serum bicarbonate at 30. Na 137 and Mg 2.2  Plan to continue diuresis with torsemide  80 mg po bid  Follow up renal function and electrolytes as outpatient in  7 days.    Type 2 diabetes mellitus with hyperlipidemia (HCC) Uncontrolled with hyperglycemia with hgb A1c of 8,2   Patient was placed oninsulin sliding scale for glucose cover and monitoring Continue basal insulin .  Patient is tolerating po well.  Continue statin    Iron deficiency anemia Iron panel with serum iron of 13, TIBC 302, transferrin saturation of 4 and ferritin of 65 Patient was placed on oral iron supplementation and plan to follow up iron stores as outpatient.  Discharge hgb is 8.9    Amiodarone  200 Eliquis  2.5 twice daily Lipitor 40 Iron Torsemide  80 twice daily Insulin  glargine 11 units daily  Discussed the use of AI scribe software for clinical note transcription with the patient, who gave verbal consent to proceed.  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   S/P CABG x 4    Severe aortic stenosis    Bruit 09/29/2014   Abdominal aortic aneurysm 07/23/2014   BCC (basal cell carcinoma) 07/23/2014   SCC (squamous cell carcinoma), arm 07/23/2014   Essential hypertension, benign 07/08/2014   Coronary artery disease 07/08/2014   Hyperlipidemia LDL goal <100 07/08/2014   Type 2 diabetes mellitus with hyperlipidemia (HCC) 07/08/2014    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   26 mm Edwards Sapien 3 transcatheter heart valve placed via percutaneous right transfemoral approach    Umbilical hernia     Past Surgical History:  Procedure Laterality Date   ABLATION     Rhythm problem; rhythm unknown; [place]   CARDIOVERSION  N/A 07/14/2018   Procedure: CARDIOVERSION;  Surgeon: Maranda Leim DEL, MD;  Location: [facility] ENDOSCOPY;  Service: Cardiovascular;  Laterality: N/A;   CARDIOVERSION N/A 08/27/2018   Procedure: CARDIOVERSION;  Surgeon: Jeffrie Oneil BROCKS, MD;  Location: [facility] ENDOSCOPY;  Service: Cardiovascular;  Laterality: N/A;   CORONARY ARTERY BYPASS GRAFT  2001   EXPLORATION POST OPERATIVE OPEN HEART  2001, June   INTRAOPERATIVE TRANSTHORACIC ECHOCARDIOGRAM N/A 04/01/2018   Procedure: INTRAOPERATIVE TRANSTHORACIC ECHOCARDIOGRAM;  Surgeon: Verlin Lonni BIRCH, MD;  Location: [facility] OR;  Service: Open Heart Surgery;  Laterality: N/A;   RIGHT/LEFT HEART CATH AND CORONARY/GRAFT ANGIOGRAPHY N/A 01/31/2018   Procedure: RIGHT/LEFT HEART CATH AND  CORONARY/GRAFT ANGIOGRAPHY;  Surgeon: Verlin Lonni BIRCH, MD;  Location: [facility] INVASIVE CV LAB;  Service: Cardiovascular;  Laterality: N/A;   TEE WITHOUT CARDIOVERSION N/A 01/17/2018   Procedure: TRANSESOPHAGEAL ECHOCARDIOGRAM (TEE);  Surgeon: Pietro Redell RAMAN, MD;  Location: [facility] ENDOSCOPY;  Service: Cardiovascular;  Laterality: N/A;   TEE WITHOUT CARDIOVERSION N/A 07/14/2018   Procedure: TRANSESOPHAGEAL ECHOCARDIOGRAM (TEE);  Surgeon: Maranda Leim DEL, MD;  Location: [facility] ENDOSCOPY;  Service: Cardiovascular;  Laterality: N/A;   TONSILLECTOMY     TRANSCATHETER AORTIC VALVE REPLACEMENT, TRANSFEMORAL N/A 04/01/2018   Procedure: TRANSCATHETER AORTIC VALVE REPLACEMENT, TRANSFEMORAL;  Surgeon: Verlin Lonni BIRCH, MD;  Location: [facility] OR;  Service: Open Heart Surgery;  Laterality: N/A;   UMBILICAL HERNIA REPAIR  2015    Social History   Tobacco Use   Smoking status: Former    Current packs/day: 1.00    Average packs/day: 1 pack/day for 3.0 years (3.0 ttl pk-yrs)    Types: Cigarettes   Smokeless tobacco: Never   Tobacco comments:    quit tobbaco in his 69s  Vaping Use   Vaping status: Never Used  Substance Use Topics   Alcohol use: No    Alcohol/week: 0.0 standard drinks of alcohol   Drug use: No    Family History  Problem Relation Age of Onset   Diabetes Mother 77       Deceased   Heart disease Father 51       Deceased   Heart disease Maternal Uncle    Diabetes Maternal Uncle    Heart disease Brother    Diabetes Brother    Diabetes Sister        #1   Heart disease Sister        #2   Hyperlipidemia Son        x2    Allergies  Allergen Reactions   Glucotrol [Glipizide] Palpitations and Other (See Comments)    Tachycardia     Medication list has been reviewed and updated.  Current Outpatient Medications on File Prior to Visit  Medication Sig Dispense Refill   acetaminophen  (TYLENOL ) 500 MG tablet Take 500 mg by mouth every 6 (six) hours as needed for fever or headache  (pain).     amiodarone  (PACERONE ) 200 MG tablet Take 1 tablet (200 mg total) by mouth daily. 90 tablet 3   apixaban  (ELIQUIS ) 5 MG TABS tablet Take 2.5 mg by mouth 2 (two) times daily.     atorvastatin  (LIPITOR) 40 MG tablet Take 40 mg by mouth at bedtime.     ferrous sulfate 325 (65 FE) MG tablet Take 1 tablet (325 mg total) by mouth daily with breakfast. 30 tablet 0   glucose blood (CONTOUR NEXT TEST) test strip Use once a day to check blood sugar.  Dx code: E11.9 100 each 12   guaiFENesin  (MUCINEX ) 600  MG 12 hr tablet Take 1 tablet (600 mg total) by mouth 2 (two) times daily. 60 tablet 0   insulin  glargine (SEMGLEE , YFGN,) 100 UNIT/ML Solostar Pen Inject 11 Units into the skin at bedtime.     Multiple Vitamin (MULTIVITAMIN WITH MINERALS) TABS tablet Take 1 tablet by mouth daily. 30 tablet 0   OXYGEN  Inhale 1 L/min into the lungs daily as needed.     prednisoLONE  acetate (PRED FORTE ) 1 % ophthalmic suspension Place 1 drop into the right eye 3 (three) times daily.     torsemide  (DEMADEX ) 20 MG tablet Take 4 tablets (80 mg total) by mouth 2 (two) times daily. 240 tablet 0   No current facility-administered medications on file prior to visit.    Review of Systems:  As per HPI- otherwise negative.   Physical Examination: There were no vitals filed for this visit. There were no vitals filed for this visit. There is no height or weight on file to calculate BMI. Ideal Body Weight:    GEN: no acute distress. HEENT: Atraumatic, Normocephalic.  Ears and Nose: No external deformity. CV: RRR, No M/G/R. No JVD. No thrill. No extra heart sounds. PULM: CTA B, no wheezes, crackles, rhonchi. No retractions. No resp. distress. No accessory muscle use. ABD: S, NT, ND, +BS. No rebound. No HSM. EXTR: No c/c/e PSYCH: Normally interactive. Conversant.    Assessment and Plan: No diagnosis found.  Assessment & Plan   Signed Harlene Schroeder, MD

## 2024-10-20 NOTE — Transitions of Care (Post Inpatient/ED Visit) (Signed)
 10/20/2024  Name: Peter Scott MRN: [id number] DOB: 03-04-1932  Today's TOC FU Call Status: Today's TOC FU Call Status:: Successful TOC FU Call Completed TOC FU Call Complete Date: 10/20/24  Patient's Name and Date of Birth confirmed. Name, DOB  Transition Care Management Follow-up Telephone Call Date of Discharge: 10/19/24 Discharge Facility: [facility]) Type of Discharge: Inpatient Admission Primary Inpatient Discharge Diagnosis:: CHF How have you been since you were released from the hospital?: Better Any questions or concerns?: No  Items Reviewed: Did you receive and understand the discharge instructions provided?: Yes Medications obtained,verified, and reconciled?: Yes (Medications Reviewed) Any new allergies since your discharge?: No Dietary orders reviewed?: Yes Type of Diet Ordered:: Low Sodium Heart Healthy Do you have support at home?: Yes People in Home [RPT]: spouse Name of Support/Comfort Primary Source: [name] Gangemi  Medications Reviewed Today: Medications Reviewed Today     Reviewed by Moises Reusing, RN (Case Manager) on 10/20/24 at 1122  Med List Status: <None>   Medication Order Taking? Sig Documenting Provider Last Dose Status Informant  acetaminophen  (TYLENOL ) 500 MG tablet [id number] No Take 500 mg by mouth every 6 (six) hours as needed for fever or headache (pain). [provider] 10/12/2024 Morning Active Multiple Informants  amiodarone  (PACERONE ) 200 MG tablet [id number] No Take 1 tablet (200 mg total) by mouth daily. Pietro Redell RAMAN, MD 10/12/2024 Morning Active Multiple Informants           Med Note (COFFELL, ANGELA M   Wed Jul 15, 2024  7:35 AM) [facility]  apixaban  (ELIQUIS ) 5 MG TABS tablet [id number] No Take 2.5 mg by mouth 2 (two) times daily. [provider] 10/12/2024  6:00 AM Active Multiple Informants  atorvastatin  (LIPITOR) 40 MG tablet [id number] No Take 40 mg by mouth at bedtime. [provider] 10/11/2024 Bedtime Active Multiple Informants           Med Note (COFFELL, ANGELA M   Wed Jul 15, 2024  7:37 AM) [facility] [place] [facility]10[facility] #90.  ferrous sulfate 325 (65 FE) MG tablet [id number]  Take 1 tablet (325 mg total) by mouth daily with breakfast. Arrien, Elidia Sieving, MD  Active   glucose blood (CONTOUR NEXT TEST) test strip [id number]  Use once a day to check blood sugar.  Dx code: E11.9 Copland, Harlene BROCKS, MD  Active Multiple Informants  guaiFENesin  (MUCINEX ) 600 MG 12 hr tablet [id number]  Take 1 tablet (600 mg total) by mouth 2 (two) times daily. Arrien, Elidia Sieving, MD  Active   insulin  glargine (SEMGLEE , YFGN,) 100 UNIT/ML Solostar Pen [id number] No Inject 11 Units into the skin at bedtime. [provider] 10/11/2024 Bedtime Active Multiple Informants           Med Note LEONARDO, KIMBERLY L   Mon Oct 12, 2024 12:06 PM) Pt states he is taking semglee  12-14 units at bedtime  Multiple Vitamin (MULTIVITAMIN WITH MINERALS) TABS tablet [id number]  Take 1 tablet by mouth daily. Arrien, Mauricio Daniel, MD  Active   OXYGEN  [id number]  Inhale 1 L/min into the lungs daily as needed. [provider]  Active Multiple Informants  prednisoLONE  acetate (PRED FORTE ) 1 % ophthalmic suspension [id number]  Place 1 drop into the right eye 3 (three) times daily. Arrien, Elidia Sieving, MD  Active   torsemide  (DEMADEX ) 20 MG tablet [id number]  Take 4 tablets (80 mg total) by mouth 2 (two) times daily. Arrien, Elidia Sieving, MD  Active  Home Care and Equipment/Supplies: Were Home Health Services Ordered?: Yes Name of Home Health Agency:: [facility] Has Agency set up a time to come to your home?: Yes First Home Health Visit Date: 10/20/24 Any new equipment or medical supplies ordered?: Yes Name of Medical supply agency?: [facility] Were you able to get the equipment/medical supplies?: Yes Do you have any questions related to the use of the equipment/supplies?:  No  Functional Questionnaire: Do you need assistance with bathing/showering or dressing?: No Do you need assistance with meal preparation?: No Do you need assistance with eating?: No Do you have difficulty maintaining continence: No Do you need assistance with getting out of bed/getting out of a chair/moving?: No Do you have difficulty managing or taking your medications?: No  Follow up appointments reviewed: PCP Follow-up appointment confirmed?: Yes Date of PCP follow-up appointment?: 10/22/24 Follow-up Provider: [facility] Follow-up appointment confirmed?: NA Do you need transportation to your follow-up appointment?: No Do you understand care options if your condition(s) worsen?: Yes-patient verbalized understanding  SDOH Interventions Today    Flowsheet Row Most Recent Value  SDOH Interventions   Food Insecurity Interventions Intervention Not Indicated  Housing Interventions Intervention Not Indicated  Transportation Interventions Intervention Not Indicated  Utilities Interventions Intervention Not Indicated    Medford Balboa, BSN, RN [place]  VBCI - Population Health RN Care Manager [phone]

## 2024-10-22 ENCOUNTER — Encounter: Payer: Self-pay | Admitting: Family Medicine

## 2024-10-22 ENCOUNTER — Telehealth: Payer: Self-pay | Admitting: Family Medicine

## 2024-10-22 ENCOUNTER — Ambulatory Visit: Admitting: Family Medicine

## 2024-10-22 VITALS — BP 114/62 | HR 75 | Ht 68.0 in | Wt 143.6 lb

## 2024-10-22 DIAGNOSIS — I509 Heart failure, unspecified: Secondary | ICD-10-CM

## 2024-10-22 DIAGNOSIS — I480 Paroxysmal atrial fibrillation: Secondary | ICD-10-CM

## 2024-10-22 DIAGNOSIS — I10 Essential (primary) hypertension: Secondary | ICD-10-CM | POA: Diagnosis not present

## 2024-10-22 DIAGNOSIS — R7401 Elevation of levels of liver transaminase levels: Secondary | ICD-10-CM

## 2024-10-22 LAB — CBC
HCT: 28.1 % — ABNORMAL LOW (ref 39.0–52.0)
Hemoglobin: 9.3 g/dL — ABNORMAL LOW (ref 13.0–17.0)
MCHC: 33.1 g/dL (ref 30.0–36.0)
MCV: 85.1 fl (ref 78.0–100.0)
Platelets: 335 K/uL (ref 150.0–400.0)
RBC: 3.3 Mil/uL — ABNORMAL LOW (ref 4.22–5.81)
RDW: 16.1 % — ABNORMAL HIGH (ref 11.5–15.5)
WBC: 10.4 K/uL (ref 4.0–10.5)

## 2024-10-22 LAB — COMPREHENSIVE METABOLIC PANEL WITH GFR
ALT: 97 U/L — ABNORMAL HIGH (ref 0–53)
AST: 42 U/L — ABNORMAL HIGH (ref 0–37)
Albumin: 3.5 g/dL (ref 3.5–5.2)
Alkaline Phosphatase: 80 U/L (ref 39–117)
BUN: 61 mg/dL — ABNORMAL HIGH (ref 6–23)
CO2: 34 meq/L — ABNORMAL HIGH (ref 19–32)
Calcium: 8.4 mg/dL (ref 8.4–10.5)
Chloride: 92 meq/L — ABNORMAL LOW (ref 96–112)
Creatinine, Ser: 1.98 mg/dL — ABNORMAL HIGH (ref 0.40–1.50)
GFR: 29.16 mL/min — ABNORMAL LOW (ref >60.00)
Glucose, Bld: 220 mg/dL — ABNORMAL HIGH (ref 70–99)
Potassium: 3.7 meq/L (ref 3.5–5.1)
Sodium: 137 meq/L (ref 135–145)
Total Bilirubin: 0.7 mg/dL (ref 0.2–1.2)
Total Protein: 6.4 g/dL (ref 6.0–8.3)

## 2024-10-22 NOTE — Patient Instructions (Addendum)
 Good to see you today!  I will be in touch with your labs asap Continue to monitor your weight at home- this is often the first sign that you are holding fluid again.  If your weight goes up more than 2-3 lbs overnight please contact me or your heart doctor  I am not concerned with your blood sugar running a bit higher- if you consistently over 250 we can increase your insulin  to 13 units or so.  Let me know how you are doing and I will advise you!

## 2024-10-22 NOTE — Telephone Encounter (Signed)
 Copied from CRM #[id number]. Topic: Clinical - Home Health Verbal Orders >> Oct 22, 2024  3:02 PM Maisie BROCKS wrote: Caller/Agency: Eveline w/ [facility] Callback Number: [phone] Service Requested: Physical Therapy Frequency: samie peters, 1week5 Any new concerns about the patient? No

## 2024-10-22 NOTE — Telephone Encounter (Signed)
Called and gave VO 

## 2024-10-26 ENCOUNTER — Inpatient Hospital Stay: Admitting: Family Medicine

## 2024-10-26 ENCOUNTER — Telehealth: Payer: Self-pay | Admitting: *Deleted

## 2024-10-26 MED ORDER — TORSEMIDE 20 MG PO TABS: 80.0000 mg | ORAL_TABLET | Freq: Two times a day (BID) | ORAL | 0 refills | Status: AC

## 2024-10-26 NOTE — Telephone Encounter (Signed)
 Spoke with pt, he is not interested in getting seen right now, he does not feel like there is anything to do right now. He has a follow up appointment in February and will keep that appointment. Patient voiced understanding to call us  if he has any concerns prior to follow up appointment.

## 2024-10-26 NOTE — Telephone Encounter (Signed)
-----   Message from Redell Shallow sent at 10/22/2024  8:00 PM EST ----- Regarding: FW: Schedule fuov with app BC ----- Message ----- From: Watt Harlene BROCKS, MD Sent: 10/22/2024   7:03 PM EST To: Redell GORMAN Shallow, MD  Hi Redell I saw Peter Scott today for follow-up from admission with CHF.  He is not scheduled to see you until March, I was not sure if you might want to see him but sooner

## 2024-10-28 ENCOUNTER — Telehealth: Payer: Self-pay

## 2024-10-28 DIAGNOSIS — J9621 Acute and chronic respiratory failure with hypoxia: Secondary | ICD-10-CM | POA: Diagnosis not present

## 2024-10-28 DIAGNOSIS — I081 Rheumatic disorders of both mitral and tricuspid valves: Secondary | ICD-10-CM | POA: Diagnosis not present

## 2024-10-28 DIAGNOSIS — N1832 Chronic kidney disease, stage 3b: Secondary | ICD-10-CM | POA: Diagnosis not present

## 2024-10-28 DIAGNOSIS — I272 Pulmonary hypertension, unspecified: Secondary | ICD-10-CM | POA: Diagnosis not present

## 2024-10-28 DIAGNOSIS — N179 Acute kidney failure, unspecified: Secondary | ICD-10-CM | POA: Diagnosis not present

## 2024-10-28 DIAGNOSIS — D631 Anemia in chronic kidney disease: Secondary | ICD-10-CM | POA: Diagnosis not present

## 2024-10-28 DIAGNOSIS — J9811 Atelectasis: Secondary | ICD-10-CM | POA: Diagnosis not present

## 2024-10-28 DIAGNOSIS — E1122 Type 2 diabetes mellitus with diabetic chronic kidney disease: Secondary | ICD-10-CM | POA: Diagnosis not present

## 2024-10-28 DIAGNOSIS — I5033 Acute on chronic diastolic (congestive) heart failure: Secondary | ICD-10-CM | POA: Diagnosis not present

## 2024-10-28 DIAGNOSIS — I130 Hypertensive heart and chronic kidney disease with heart failure and stage 1 through stage 4 chronic kidney disease, or unspecified chronic kidney disease: Secondary | ICD-10-CM | POA: Diagnosis not present

## 2024-10-28 DIAGNOSIS — D509 Iron deficiency anemia, unspecified: Secondary | ICD-10-CM | POA: Diagnosis not present

## 2024-10-28 DIAGNOSIS — E876 Hypokalemia: Secondary | ICD-10-CM | POA: Diagnosis not present

## 2024-10-28 NOTE — Telephone Encounter (Signed)
 Plan of care signed and faxed back to [facility] at [phone]. Form sent for scanning.

## 2024-11-03 ENCOUNTER — Telehealth: Payer: Self-pay

## 2024-11-03 NOTE — Telephone Encounter (Signed)
 Copied from CRM #[id number]. Topic: Clinical - Medication Question >> Nov 03, 2024  2:42 PM Paige D wrote: Reason for CRM: Lili with [facility] calling in regards to to taking  80mg  torsemide  (DEMADEX ) 20 MG tablet twice a day states pt is doing better and is asking if this dosage can be dropped?  Also nurse Lili with [facility] states pt  Blood sugar is now in range of 200-250 and will not come down In between meals nurse lili is stating if you want to change anything or reach out to endocrinology ? She is asking if you want changes or anything different to please let her know  Lili call back [phone]

## 2024-11-04 NOTE — Telephone Encounter (Signed)
 Called Peter Scott back  Last seen by me 12/4-  he is on 80 torsemide  BID and is not having any fluid retention,  he seems to be losing weight and has dropped 5 lbs in the last week or so. Will decrease his torsemide  to 60 mg BID  They note his glucose is a bit high - up to bout 250.  reassured this is ok given his age but ok to increase his semglee  from 12 to14 units daily if desired I will reach out to patient and offer an appt

## 2024-11-05 ENCOUNTER — Other Ambulatory Visit

## 2024-11-05 ENCOUNTER — Encounter: Payer: Self-pay | Admitting: Family Medicine

## 2024-11-05 DIAGNOSIS — R7401 Elevation of levels of liver transaminase levels: Secondary | ICD-10-CM

## 2024-11-05 LAB — COMPREHENSIVE METABOLIC PANEL WITH GFR
ALT: 38 U/L (ref 3–53)
AST: 26 U/L (ref 5–37)
Albumin: 3.7 g/dL (ref 3.5–5.2)
Alkaline Phosphatase: 78 U/L (ref 39–117)
BUN: 71 mg/dL — ABNORMAL HIGH (ref 6–23)
CO2: 33 meq/L — ABNORMAL HIGH (ref 19–32)
Calcium: 8.9 mg/dL (ref 8.4–10.5)
Chloride: 93 meq/L — ABNORMAL LOW (ref 96–112)
Creatinine, Ser: 2.36 mg/dL — ABNORMAL HIGH (ref 0.40–1.50)
GFR: 23.61 mL/min — ABNORMAL LOW (ref >60.00)
Glucose, Bld: 242 mg/dL — ABNORMAL HIGH (ref 70–99)
Potassium: 3.8 meq/L (ref 3.5–5.1)
Sodium: 139 meq/L (ref 135–145)
Total Bilirubin: 0.4 mg/dL (ref 0.2–1.2)
Total Protein: 6.7 g/dL (ref 6.0–8.3)

## 2024-11-09 ENCOUNTER — Other Ambulatory Visit (HOSPITAL_BASED_OUTPATIENT_CLINIC_OR_DEPARTMENT_OTHER): Payer: Self-pay

## 2024-11-09 ENCOUNTER — Encounter: Admitting: Dietician

## 2024-11-09 ENCOUNTER — Encounter: Payer: Self-pay | Admitting: Cardiology

## 2024-11-09 DIAGNOSIS — I509 Heart failure, unspecified: Secondary | ICD-10-CM

## 2024-11-09 DIAGNOSIS — E785 Hyperlipidemia, unspecified: Secondary | ICD-10-CM

## 2024-11-09 NOTE — Progress Notes (Signed)
" °  Medical Nutrition Therapy  Appointment Start time:  1420  Appointment End time:  1450 This is a phone visit due to patient and wife mobility and inability to drive long distances.  They are at their home and I am in my office.  Primary concerns today: Patient's wife would like some ideas about how to cook on a low sodium diabetic diet.  They have stopped using or cooking with added salt and have started to reduce the processed meat.  Their son dropped up some pork and chicken that are unsalted and they are freezing this for later use. Grilled chicken, pork, beef but too dry and needs gravy. He was recently hospitalized for CHF.   Referral diagnosis:  Type 2 Diabetes Preferred learning style: , no preference indicated Learning readiness: ready, change in progress   NUTRITION ASSESSMENT  Wt Readings from Last 3 Encounters:  10/22/24 143 lb 9.6 oz (65.1 kg)  10/20/24 142 lb (64.4 kg)  10/16/24 142 lb (64.4 kg)   He states that his fluid pill was decreased due to his continued weight loss.  Clinical Medical Hx: Type 2 Diabetes (>20 years), CHF Medications: see list to include 12 units insulin  glargine q HS, torsemide , HLT, HTN, CKD, CABGx4, TAVR Labs: A1C 8.2% 10/12/2024, eGFR 23 on 11/05/2024 Notable Signs/Symptoms: weight changes  Lifestyle & Dietary Hx Patient lives with his wife.  He is a retired Art Gallery Manager. Avoids added salt  24-Hr Dietary Recall First Meal: 1-2 eggs, sausage patty or bacon, pear Snack: none Second Meal: Grilled pork, 1/2 sweet potato, broccoli Snack: none Third Meal: Grilled pork chop, steamed broccoli, cauliflower, carrots, salad with avocado Snack: none Beverages: water, sugar free lemonade  NUTRITION DIAGNOSIS  NB-1.1 Food and nutrition-related knowledge deficit As related to low sodium and diabetic diet.  As evidenced by patient and wife report.   NUTRITION INTERVENTION  Nutrition education (E-1) on the following topics:  Low sodium  options Cooking tips Label reading Balanced plate Sources of carbohydrates  Handouts Provided Include - emailed and mailed to home Heart Healthy Carb Consistent Nutrition Therapy from AND  Diabetes Resources  Learning Style & Readiness for Change Teaching method utilized: Visual & Auditory  Demonstrated degree of understanding via: Teach Back  Barriers to learning/adherence to lifestyle change: medical concerns  Goals Established by Pt Continue a low sodium carb balanced diet   MONITORING & EVALUATION Dietary intake, weekly physical activity, and label reading prn.  Next Steps  Patient is to call for questions. "

## 2024-11-09 NOTE — Patient Instructions (Signed)
 You have already made many changes with you should continue.   Below are a few things that may help you:  Cookbook idea:  American Heart Association   Low sodium gravy options:   Homemade white gravy made with pepper but no added salt   McC[employer] lower sodium gravy seasoning packets   Heinz low sodium gravy   Look for a gravy with less than 250 mg per serving and serve with unsalted meat, potatoes, rice, and vegetables for a balanced meal.  Low sodium spaghetti sauce:  [place] low sodium pasta sauce  Low sodium salsa:  [place]  Find a low sodium bacon and sausage and avoid the regular portions.  Make a soup or add a low sodium gravy to help your dry meats be more moist.  Continue to cook most of your own foods and prepare without salt. Avoid seasonings with added potassium (this is used instead of salt).  We want your potassium to come from your food and not products like seasonings. Avoid over-restriction that may cause weight loss.  Please call for questions.

## 2024-11-17 ENCOUNTER — Other Ambulatory Visit (HOSPITAL_BASED_OUTPATIENT_CLINIC_OR_DEPARTMENT_OTHER): Payer: Self-pay

## 2024-11-20 NOTE — Progress Notes (Signed)
 [facility]er, [employer] [facility] [employer] [address], [address] [place], [place] [address] [id number] [phone]  Date:  11/23/2024   Name:  Peter Scott   DOB:  [birth year]   MRN:  [id number]  PCP:  Watt Harlene BROCKS, MD    Chief Complaint: Follow-up (No concerns )   History of Present Illness:  Peter Scott is a 89 y.o. very pleasant male patient who presents with the following:  Patient is in today for periodic follow-up.  I saw him most recently about 1 month ago History of diabetes, atrial fibrillation, aortic valve replacement, CABG, skin cancer, hypertension, hyperlipidemia, CHF, CRI He does have a nephrologist at the [facility] He was admitted for about a week at the end of November with a heart failure exacerbation He is now using oxygen  at home but not 24 hours a day. At her last visit I reassured him that tight glucose control is no longer our priority He was to continue daily weights to monitor fluid His last lab work showed a minor transaminitis which resolved on recheck on 12/18 I did have him back down on his torsemide  slightly He also recently reached out to Dr. Pietro regarding starting Jardiance , he received a letter from the [facility] suggesting this Most recent GFR from December 23, creatinine 2.35  Lab Results  Component Value Date   HGBA1C 8.2 (H) 10/12/2024   Discussed the use of AI scribe software for clinical note transcription with the patient, who gave verbal consent to proceed.  History of Present Illness Peter Scott is a 89 year old male who presents for follow-up on fluid management and kidney function.  He has experienced significant improvement in his condition since his last visit a month ago. The swelling in his ankles has decreased substantially, allowing for more comfortable walking. He states, 'I can walk, I get down on the road now,' indicating a positive impact on his mobility.  His breathing has improved, and he can now breathe  deeply. He uses supplemental oxygen  occasionally during the day, especially when driving or being out for extended periods, but does not require it while sleeping. He carries a portable oxygen  supply in his car for convenience.  He has been on a reduced dose of torsemide , which is working well. He reports that he is not gaining weight and does not feel he is retaining fluid, and he can breathe comfortably while lying flat, particularly on his left side, which was previously difficult.  He is scheduled for cataract surgery on his left eye in the middle of the month and has a pre-operative appointment soon.  He is aware that his kidney function is not perfect and that there has been some worsening over the past two years, as discussed with his doctors.  He is seeing his nephrologist he believes in the next 30 days  Wt Readings from Last 3 Encounters:  11/23/24 139 lb 12.8 oz (63.4 kg)  10/22/24 143 lb 9.6 oz (65.1 kg)  10/20/24 142 lb (64.4 kg)     Patient Active Problem List   Diagnosis Date Noted   Iron deficiency anemia 10/19/2024   Acute on chronic diastolic CHF (congestive heart failure) (HCC) 10/12/2024   CHF exacerbation (HCC) 10/12/2024   Acute on chronic heart failure with preserved ejection fraction (HFpEF) (HCC) 07/14/2024   CKD stage 3b, GFR 30-44 ml/min (HCC) - baseline Scr 1.8-2.0 07/14/2024   Acute respiratory failure with hypoxia (HCC) 07/14/2024  Paroxysmal atrial fibrillation (HCC)    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   S/P CABG x 4    Severe aortic stenosis    Bruit 09/29/2014   Abdominal aortic aneurysm 07/23/2014   BCC (basal cell carcinoma) 07/23/2014   SCC (squamous cell carcinoma), arm 07/23/2014   Essential hypertension, benign 07/08/2014   Coronary artery disease 07/08/2014   Hyperlipidemia LDL goal <100 07/08/2014   Type 2 diabetes mellitus with hyperlipidemia (HCC) 07/08/2014    Past Medical History:  Diagnosis Date   Aortic  aneurysm    Aortic stenosis    CAD (coronary artery disease)    CKD stage 3b, GFR 30-44 ml/min (HCC) 07/14/2024   Diabetes mellitus type II, controlled (HCC)    Heart disease    Ablation   History of chicken pox    Hyperlipidemia    Hypertension    S/P CABG x 4    2001 - [place]    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   26 mm Edwards Sapien 3 transcatheter heart valve placed via percutaneous right transfemoral approach    Umbilical hernia     Past Surgical History:  Procedure Laterality Date   ABLATION     Rhythm problem; rhythm unknown; [place]   CARDIOVERSION N/A 07/14/2018   Procedure: CARDIOVERSION;  Surgeon: Maranda Leim DEL, MD;  [facility] END[facility]COPY;  Service: Cardiovascular;  Laterality: N/A;   CARDIOVERSION N/A 08/27/2018   Procedure: CARDIOVERSION;  Surgeon: Jeffrie Oneil BROCKS, MD;  Location: [facility] ENDOSCOPY;  Service: Cardiovascular;  Laterality: N/A;   CORONARY ARTERY BYPASS GRAFT  2001   EXPLORATION POST OPERATIVE OPEN HEART  2001, June   INTRAOPERATIVE TRANSTHORACIC ECHOCARDIOGRAM N/A 04/01/2018   Procedure: INTRAOPERATIVE TRANSTHORACIC ECHOCARDIOGRAM;  Surgeon: Verlin Lonni BIRCH, MD;  Location: [facility] OR;  Service: Open Heart Surgery;  Laterality: N/A;   RIGHT/LEFT HEART CATH AND CORONARY/GRAFT ANGIOGRAPHY N/A 01/31/2018   Procedure: RIGHT/LEFT HEART CATH AND CORONARY/GRAFT ANGIOGRAPHY;  Surgeon: Verlin Lonni BIRCH, MD;  Location: [facility] INVASIVE CV LAB;  Service: Cardiovascular;  Laterality: N/A;   TEE WITHOUT CARDIOVERSION N/A 01/17/2018   Procedure: TRANSESOPHAGEAL ECHOCARDIOGRAM (TEE);  Surgeon: Pietro Redell RAMAN, MD;  Location[facility] ENDOSCOPY;  Service: Cardiovascular;  Laterality: N/A;   TEE WITHOUT CARDIOVERSION N/A 07/14/2018   Procedure: TRANSESOPHAGEAL ECHOCARDIOGRAM (TEE);  Surgeon: Maranda Leim DEL, MD;  Location: [facility] ENDOSCOPY;  Service: Cardiovascular;  Laterality: N/A;   TONSILLECTOMY     TRANSCATHETER AORTIC VALVE REPLACEMENT,  TRANSFEMORAL N/A 04/01/2018   Procedure: TRANSCATHETER AORTIC VALVE REPLACEMENT, TRANSFEMORAL;  Surgeon: Verlin Lonni BIRCH, MD;  Location: [facility] OR;  Service: Open Heart Surgery;  Laterality: N/A;   UMBILICAL HERNIA REPAIR  2015    Social History[1]  Family History  Problem Relation Age of Onset   Diabetes Mother 72       Deceased   Heart disease Father 66       Deceased   Heart disease Maternal Uncle    Diabetes Maternal Uncle    Heart disease Brother    Diabetes Brother    Diabetes Sister        #1   Heart disease Sister        #2   Hyperlipidemia Son        x2    Allergies[2]  Medication list has been reviewed and updated.  Medications Ordered Prior to Encounter[3]  Review of Systems:  As per HPI- otherwise negative.   Physical Examination: Vitals:   11/23/24 1026  BP: (!) 142/74  Pulse: (!) 55  SpO2: 93%   Vitals:   11/23/24 1026  Weight: 139 lb 12.8 oz (63.4 kg)  Height: 5' 8 (1.727 m)   Body mass index is 21.26 kg/m. Ideal Body Weight: Weight in (lb) to have BMI = 25: 164.1  GEN: no acute distress. Looks very well for age HEENT: Atraumatic, Normocephalic.  Ears and Nose: No external deformity. CV: RRR, No M/G/R. No JVD. No thrill. No extra heart sounds. PULM: CTA B, no wheezes, crackles, rhonchi. No retractions. No resp. distress. No accessory muscle use. EXTR: No c/c/e PSYCH: Normally interactive. Conversant.  Ankles are tight- no edema    Assessment and Plan: Paroxysmal atrial fibrillation (HCC)  Essential hypertension, benign  Assessment & Plan Acute on chronic diastolic congestive heart failure Swelling decreased, ambulation improved, breathing stable, oxygen  saturation 92-93%, fluid retention controlled, no orthopnea or paroxysmal nocturnal dyspnea.  Chronic kidney disease stage 3b Kidney function stable despite high BUN, considering Jardiance  with renal monitoring. - I think he should be fine to start on Jardiance  10, he plans to  get this through the [facility] - Monitor kidney function regularly.  Type 2 diabetes mellitus Discussed Jardiance  for diabetes management, pending primary care doctor consultation. - Discuss Jardiance  with primary care doctor for initiation.  General health maintenance Cataract surgery scheduled, pre-operative check planned. - Proceed with pre-operative check for cataract surgery. - Schedule cataract surgery as planned.  I offered to follow-up on labs today but he declines, [facility] within [facility] next 4 to 6 weeks and will have blood work then.  He has an appointment to see me in March for routine follow-up  Signed Harlene Schroeder, MD    [1]  Social History Tobacco Use   Smoking status: Former    Current packs/day: 1.00    Average packs/day: 1 pack/day for 3.0 years (3.0 ttl pk-yrs)    Types: Cigarettes   Smokeless tobacco: Never   Tobacco comments:    quit tobbaco in his 89s  Vaping Use   Vaping status: Never Used  Substance Use Topics   Alcohol use: No    Alcohol/week: 0.0 standard drinks of alcohol   Drug use: No  [2]  Allergies Allergen Reactions   Glucotrol [Glipizide] Palpitations and Other (See Comments)    Tachycardia   [3]  Current Outpatient Medications on File Prior to Visit  Medication Sig Dispense Refill   acetaminophen  (TYLENOL ) 500 MG tablet Take 500 mg by mouth every 6 (six) hours as needed for fever or headache (pain).     amiodarone  (PACERONE ) 200 MG tablet Take 1 tablet (200 mg total) by mouth daily. 90 tablet 3   apixaban  (ELIQUIS ) 5 MG TABS tablet Take 2.5 mg by mouth 2 (two) times daily.     atorvastatin  (LIPITOR) 40 MG tablet Take 40 mg by mouth at bedtime.     ferrous sulfate  325 (65 FE) MG tablet Take 1 tablet (325 mg total) by mouth daily with breakfast. 30 tablet 0   glucose blood (CONTOUR NEXT TEST) test strip Use once a day to check blood sugar.  Dx code: E11.9 100 each 12   guaiFENesin  (MUCINEX ) 600 MG 12 hr tablet Take 1  tablet (600 mg total) by mouth 2 (two) times daily. 60 tablet 0   insulin  glargine (SEMGLEE , YFGN,) 100 UNIT/ML Solostar Pen Inject 11 Units into the skin at bedtime.     Multiple Vitamin (MULTIVITAMIN WITH MINERALS) TABS tablet Take 1 tablet by mouth daily.  30 tablet 0   OXYGEN  Inhale 1 L/min into the lungs daily as needed.     prednisoLONE  acetate (PRED FORTE ) 1 % ophthalmic suspension Place 1 drop into the right eye 3 (three) times daily.     torsemide  (DEMADEX ) 20 MG tablet Take 4 tablets (80 mg total) by mouth 2 (two) times daily. 240 tablet 0   No current facility-administered medications on file prior to visit.   "

## 2024-11-23 ENCOUNTER — Ambulatory Visit (INDEPENDENT_AMBULATORY_CARE_PROVIDER_SITE_OTHER): Admitting: Family Medicine

## 2024-11-23 ENCOUNTER — Encounter: Payer: Self-pay | Admitting: Family Medicine

## 2024-11-23 VITALS — BP 142/74 | HR 55 | Ht 68.0 in | Wt 139.8 lb

## 2024-11-23 DIAGNOSIS — I10 Essential (primary) hypertension: Secondary | ICD-10-CM | POA: Diagnosis not present

## 2024-11-23 DIAGNOSIS — I480 Paroxysmal atrial fibrillation: Secondary | ICD-10-CM | POA: Diagnosis not present

## 2024-11-23 NOTE — Patient Instructions (Signed)
 I think starting on 10 mg of Jardiance  would be a fine idea for you I am so glad you are doing

## 2024-12-09 NOTE — Progress Notes (Signed)
 "    HPI: FU CAD, CHF and AVR. Patient is status post coronary artery bypass graft in [place]  in 2001. He also had ablation of what sounds to be SVT at that time. Carotid Dopplers March 2019 showed 1 to 39% bilateral stenosis. He underwent TAVR 5/19. Patient had preoperative cardiac catheterization that revealed severe native three-vessel coronary artery disease with patency of grafts.  Also with h/o PAF. Abd ultrasound 2/21 showed no AAA. Echocardiogram October 2025 showed ejection fraction 45 to 50%, grade 2 diastolic dysfunction, moderate left atrial enlargement, mild to moderate mitral regurgitation, mild to moderate tricuspid regurgitation, status post TAVR with mean gradient 14 mmHg and trace aortic insufficiency.  Admitted November 2025 with congestive heart failure.  Improved with diuresis.  Creatinine at discharge 2.37.  Chest x-ray November 2025 felt likely to have edema and opacity in the left hilar region and follow-up chest CT recommended.  Since last seen, he denies dyspnea, chest pain, palpitations or syncope.  No bleeding.  Current Outpatient Medications  Medication Sig Dispense Refill   acetaminophen  (TYLENOL ) 500 MG tablet Take 500 mg by mouth every 6 (six) hours as needed for fever or headache (pain).     amiodarone  (PACERONE ) 200 MG tablet Take 1 tablet (200 mg total) by mouth daily. 90 tablet 3   apixaban  (ELIQUIS ) 5 MG TABS tablet Take 2.5 mg by mouth 2 (two) times daily.     atorvastatin  (LIPITOR) 40 MG tablet Take 40 mg by mouth at bedtime.     ferrous sulfate  325 (65 FE) MG tablet Take 1 tablet (325 mg total) by mouth daily with breakfast. 30 tablet 0   glucose blood (CONTOUR NEXT TEST) test strip Use once a day to check blood sugar.  Dx code: E11.9 100 each 12   guaiFENesin  (MUCINEX ) 600 MG 12 hr tablet Take 1 tablet (600 mg total) by mouth 2 (two) times daily. 60 tablet 0   insulin  glargine (SEMGLEE , YFGN,) 100 UNIT/ML Solostar Pen Inject 11 Units into the skin at bedtime.      Multiple Vitamin (MULTIVITAMIN WITH MINERALS) TABS tablet Take 1 tablet by mouth daily. 30 tablet 0   OXYGEN  Inhale 1 L/min into the lungs daily as needed.     prednisoLONE  acetate (PRED FORTE ) 1 % ophthalmic suspension Place 1 drop into the right eye 3 (three) times daily.     torsemide  (DEMADEX ) 20 MG tablet Take 4 tablets (80 mg total) by mouth 2 (two) times daily. 240 tablet 0   No current facility-administered medications for this visit.     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   26 mm Edwards Sapien 3 transcatheter heart valve placed via percutaneous right transfemoral approach    Umbilical hernia     Past Surgical History:  Procedure Laterality Date   ABLATION     Rhythm problem; rhythm unknown; [place]   CARDIOVERSION N/A 07/14/2018   Procedure: CARDIOVERSION;  Surgeon: Maranda Leim DEL, MD;  Location: [facility] ENDOSCOPY;  Service: Cardiovascular;  Laterality: N/A;   CARDIOVERSION N/A  08/27/2018   Procedure: CARDIOVERSION;  Surgeon: Jeffrie Oneil BROCKS, MD;  Location: [facility] ENDOSCOPY;  Service: Cardiovascular;  Laterality: N/A;   CORONARY ARTERY BYPASS GRAFT  2001   EXPLORATION POST OPERATIVE OPEN HEART  2001, June   INTRAOPERATIVE TRANSTHORACIC ECHOCARDIOGRAM N/A 04/01/2018   Procedure: INTRAOPERATIVE TRANSTHORACIC ECHOCARDIOGRAM;  Surgeon: Verlin Lonni BIRCH, MD;  Location[facility] OR;  Service: Open Heart Surgery;  Laterality: N/A;   RIGHT/LEFT HEART CATH AND CORONARY/GRAFT ANGIOGRAPHY N/A 01/31/2018   Procedure: RIGHT/LEFT HEART CATH AND CORONARY/GRAFT ANGIOGRAPHY;  Surgeon: Verlin Lonni BIRCH, MD;  Location: [facility]  INVASIVE CV LAB;  Service: Cardiovascular;  Laterality: N/A;   TEE WITHOUT CARDIOVERSION N/A 01/17/2018   Procedure: TRANSESOPHAGEAL ECHOCARDIOGRAM (TEE);  Surgeon: Pietro Redell RAMAN, MD;  Location: [facility] ENDOSCOPY;  Service: Cardiovascular;  Laterality: N/A;   TEE WITHOUT CARDIOVERSION N/A 07/14/2018   Procedure: TRANSESOPHAGEAL ECHOCARDIOGRAM (TEE);  Surgeon: Maranda Leim DEL, MD;  Location: [facility] ENDOSCOPY;  Service: Cardiovascular;  Laterality: N/A;   TONSILLECTOMY     TRANSCATHETER AORTIC VALVE REPLACEMENT, TRANSFEMORAL N/A 04/01/2018   Procedure: TRANSCATHETER AORTIC VALVE REPLACEMENT, TRANSFEMORAL;  Surgeon: Verlin Lonni BIRCH, MD;  Location: [facility] OR;  Service: Open Heart Surgery;  Laterality: N/A;   UMBILICAL HERNIA REPAIR  2015    Social History   Socioeconomic History   Marital status: Married    Spouse name: Not on file   Number of children: 2   Years of education: Not on file   Highest education level: Not on file  Occupational History   Not on file  Tobacco Use   Smoking status: Former    Current packs/day: 1.00    Average packs/day: 1 pack/day for 3.0 years (3.0 ttl pk-yrs)    Types: Cigarettes   Smokeless tobacco: Never   Tobacco comments:    quit tobbaco in his 46s  Vaping Use   Vaping status: Never Used  Substance and Sexual Activity   Alcohol use: No    Alcohol/week: 0.0 standard drinks of alcohol   Drug use: No   Sexual activity: Not on file  Other Topics Concern   Not on file  Social History Narrative   Not on file   Social Drivers of Health   Tobacco Use: Medium Risk (12/23/2024)   Patient History    Smoking Tobacco Use: Former    Smokeless Tobacco Use: Never    Passive Exposure: Not on Actuary Strain: Not on file  Food Insecurity: No Food Insecurity (10/20/2024)   Epic    Worried About Programme Researcher, Broadcasting/film/video in the Last Year: Never true    Ran Out of Food in the Last Year: Never true  Transportation Needs: No Transportation Needs  (10/20/2024)   Epic    Lack of Transportation (Medical): No    Lack of Transportation (Non-Medical): No  Physical Activity: Not on file  Stress: Not on file  Social Connections: Moderately Integrated (10/12/2024)   Social Connection and Isolation Panel    Frequency of Communication with Friends and Family: More than three times a week    Frequency of Social Gatherings with Friends and Family: More than three times a week    Attends Religious Services: More than 4 times per year    Active Member of [employer] or Organizations: No    Attends Banker Meetings: Never    Marital Status: Married  Catering Manager Violence: Not At Risk (10/20/2024)   Epic    Fear of Current or Ex-Partner: No  Emotionally Abused: No    Physically Abused: No    Sexually Abused: No  Depression (PHQ2-9): Low Risk (07/23/2024)   Depression (PHQ2-9)    PHQ-2 Score: 1  Alcohol Screen: Not on file  Housing: Unknown (10/20/2024)   Epic    Unable to Pay for Housing in the Last Year: No    Number of Times Moved in the Last Year: Not on file    Homeless in the Last Year: No  Utilities: Not At Risk (10/20/2024)   Epic    Threatened with loss of utilities: No  Health Literacy: Not on file    Family History  Problem Relation Age of Onset   Diabetes Mother 18       Deceased   Heart disease Father 55       Deceased   Heart disease Maternal Uncle    Diabetes Maternal Uncle    Heart disease Brother    Diabetes Brother    Diabetes Sister        #1   Heart disease Sister        #2   Hyperlipidemia Son        x2    ROS: no fevers or chills, productive cough, hemoptysis, dysphasia, odynophagia, melena, hematochezia, dysuria, hematuria, rash, seizure activity, orthopnea, PND, pedal edema, claudication. Remaining systems are negative.  Physical Exam: Well-developed well-nourished in no acute distress.  Skin is warm and dry.  HEENT is normal.  Neck is supple.  Chest is clear to auscultation with normal  expansion.  Cardiovascular exam is irregular Abdominal exam nontender or distended. No masses palpated. Extremities show no edema. neuro grossly intact  EKG Interpretation Date/Time:  Wednesday December 23 2024 08:28:05 EST Ventricular Rate:  78 PR Interval:    QRS Duration:  114 QT Interval:  418 QTC Calculation: 476 R Axis:   -33  Text Interpretation: Atrial fibrillation with premature ventricular or aberrantly conducted complexes Left axis deviation Right bundle branch block Confirmed by Pietro Rogue ([id number]) on 12/23/2024 8:37:52 AM      A/P  1 paroxysmal atrial fibrillation-Continue apixaban  and amiodarone  at present dose.  He is in atrial fibrillation today.  I discussed repeating his cardioversion but he would like to be conservative for now.  I will see him back in 3 to 4 months.  If he remains in atrial fibrillation at that time would likely recommend proceeding with cardioversion to see if he would hold sinus rhythm as I think this would help his heart failure symptoms long-term.  Will check hemoglobin.  2 chronic combined systolic/diastolic congestive heart failure-he is euvolemic today.  Continue diuretic at present dose.  Check potassium and renal function.  Note he is not on an ARB or Entresto due to baseline renal insufficiency.  He has a history of bradycardia when in sinus rhythm and therefore not on a beta-blocker.  3 status post TAVR-most recent echocardiogram showed no significant aortic stenosis and trace aortic insufficiency.  Continue SBE prophylaxis.  4 mitral regurgitation-mild to moderate on most recent echocardiogram.  5 hypertension-blood pressure is controlled.  Continue present medical regimen.  6 hyperlipidemia-continue statin.  7 coronary artery disease-continue statin.  8 chronic stage IV kidney disease  9 abnormal chest x-ray-Will arrange noncontrast chest CT.  Rogue Pietro, MD    "

## 2024-12-23 ENCOUNTER — Ambulatory Visit: Admitting: Cardiology

## 2024-12-23 ENCOUNTER — Encounter: Payer: Self-pay | Admitting: Cardiology

## 2024-12-23 ENCOUNTER — Ambulatory Visit (HOSPITAL_BASED_OUTPATIENT_CLINIC_OR_DEPARTMENT_OTHER)
Admission: RE | Admit: 2024-12-23 | Discharge: 2024-12-23 | Disposition: A | Source: Ambulatory Visit | Attending: Cardiology | Admitting: Cardiology

## 2024-12-23 VITALS — BP 135/65 | HR 78 | Ht 68.0 in | Wt 138.1 lb

## 2024-12-23 DIAGNOSIS — I480 Paroxysmal atrial fibrillation: Secondary | ICD-10-CM | POA: Diagnosis not present

## 2024-12-23 DIAGNOSIS — I2510 Atherosclerotic heart disease of native coronary artery without angina pectoris: Secondary | ICD-10-CM

## 2024-12-23 DIAGNOSIS — R9389 Abnormal findings on diagnostic imaging of other specified body structures: Secondary | ICD-10-CM

## 2024-12-23 DIAGNOSIS — E785 Hyperlipidemia, unspecified: Secondary | ICD-10-CM

## 2024-12-23 DIAGNOSIS — Z952 Presence of prosthetic heart valve: Secondary | ICD-10-CM

## 2024-12-23 DIAGNOSIS — I10 Essential (primary) hypertension: Secondary | ICD-10-CM

## 2024-12-23 NOTE — Patient Instructions (Signed)
" ° °  Testing/Procedures:  CT CHEST WO CONTRAST AT [facility]  Follow-Up: At [facility], you and your health needs are our priority.  As part of our continuing mission to provide you with exceptional heart care, our providers are all part of one team.  This team includes your primary Cardiologist (physician) and Advanced Practice Providers or APPs (Physician Assistants and Nurse Practitioners) who all work together to provide you with the care you need, when you need it.  Your next appointment:   3 month(s)  Provider:   Redell Shallow, MD              "

## 2024-12-24 ENCOUNTER — Ambulatory Visit: Payer: Self-pay | Admitting: Cardiology

## 2024-12-24 DIAGNOSIS — E876 Hypokalemia: Secondary | ICD-10-CM

## 2024-12-24 LAB — CBC
Hematocrit: 40 % (ref 37.5–51.0)
Hemoglobin: 12.6 g/dL — ABNORMAL LOW (ref 13.0–17.7)
MCH: 29.4 pg (ref 26.6–33.0)
MCHC: 31.5 g/dL (ref 31.5–35.7)
MCV: 93 fL (ref 79–97)
Platelets: 219 10*3/uL (ref 150–450)
RBC: 4.29 x10E6/uL (ref 4.14–5.80)
RDW: 16.4 % — ABNORMAL HIGH (ref 11.6–15.4)
WBC: 11.7 10*3/uL — ABNORMAL HIGH (ref 3.4–10.8)

## 2024-12-24 LAB — BASIC METABOLIC PANEL WITH GFR
BUN/Creatinine Ratio: 35 — ABNORMAL HIGH (ref 10–24)
BUN: 68 mg/dL — ABNORMAL HIGH (ref 10–36)
CO2: 25 mmol/L (ref 20–29)
Calcium: 8.7 mg/dL (ref 8.6–10.2)
Chloride: 94 mmol/L — ABNORMAL LOW (ref 96–106)
Creatinine, Ser: 1.97 mg/dL — ABNORMAL HIGH (ref 0.76–1.27)
Glucose: 176 mg/dL — ABNORMAL HIGH (ref 70–99)
Potassium: 3.3 mmol/L — ABNORMAL LOW (ref 3.5–5.2)
Sodium: 138 mmol/L (ref 134–144)
eGFR: 32 mL/min/{1.73_m2} — ABNORMAL LOW

## 2024-12-24 MED ORDER — POTASSIUM CHLORIDE CRYS ER 20 MEQ PO TBCR: 20.0000 meq | EXTENDED_RELEASE_TABLET | Freq: Every day | ORAL | 3 refills | Status: AC

## 2025-02-03 ENCOUNTER — Ambulatory Visit: Admitting: Family Medicine

## 2025-04-14 ENCOUNTER — Ambulatory Visit: Admitting: Cardiology
